# Patient Record
Sex: Female | Born: 1940 | Race: Black or African American | Hispanic: No | Marital: Single | Smoking: Never smoker
Health system: Southern US, Community
[De-identification: ages and names within clinical notes are randomized; demographics above are authoritative.]

## PROBLEM LIST (undated history)

## (undated) DIAGNOSIS — K2211 Ulcer of esophagus with bleeding: Secondary | ICD-10-CM

## (undated) DIAGNOSIS — Z87442 Personal history of urinary calculi: Secondary | ICD-10-CM

## (undated) DIAGNOSIS — Z5189 Encounter for other specified aftercare: Secondary | ICD-10-CM

## (undated) DIAGNOSIS — C801 Malignant (primary) neoplasm, unspecified: Secondary | ICD-10-CM

## (undated) DIAGNOSIS — E785 Hyperlipidemia, unspecified: Secondary | ICD-10-CM

## (undated) DIAGNOSIS — D649 Anemia, unspecified: Secondary | ICD-10-CM

## (undated) DIAGNOSIS — D126 Benign neoplasm of colon, unspecified: Secondary | ICD-10-CM

## (undated) DIAGNOSIS — N281 Cyst of kidney, acquired: Secondary | ICD-10-CM

## (undated) DIAGNOSIS — Z923 Personal history of irradiation: Secondary | ICD-10-CM

## (undated) DIAGNOSIS — N189 Chronic kidney disease, unspecified: Secondary | ICD-10-CM

## (undated) DIAGNOSIS — K219 Gastro-esophageal reflux disease without esophagitis: Secondary | ICD-10-CM

## (undated) DIAGNOSIS — D869 Sarcoidosis, unspecified: Secondary | ICD-10-CM

## (undated) DIAGNOSIS — I10 Essential (primary) hypertension: Secondary | ICD-10-CM

## (undated) DIAGNOSIS — H269 Unspecified cataract: Secondary | ICD-10-CM

## (undated) DIAGNOSIS — T7840XA Allergy, unspecified, initial encounter: Secondary | ICD-10-CM

## (undated) DIAGNOSIS — E119 Type 2 diabetes mellitus without complications: Secondary | ICD-10-CM

## (undated) HISTORY — PX: COLONOSCOPY: SHX174

## (undated) HISTORY — PX: ABDOMINAL HYSTERECTOMY: SHX81

## (undated) HISTORY — DX: Chronic kidney disease, unspecified: N18.9

## (undated) HISTORY — PX: BREAST LUMPECTOMY: SHX2

## (undated) HISTORY — DX: Allergy, unspecified, initial encounter: T78.40XA

## (undated) HISTORY — DX: Benign neoplasm of colon, unspecified: D12.6

## (undated) HISTORY — DX: Hyperlipidemia, unspecified: E78.5

## (undated) HISTORY — PX: LITHOTRIPSY: SUR834

## (undated) HISTORY — PX: CATARACT EXTRACTION W/ INTRAOCULAR LENS IMPLANT: SHX1309

## (undated) HISTORY — DX: Gastro-esophageal reflux disease without esophagitis: K21.9

## (undated) HISTORY — PX: POLYPECTOMY: SHX149

## (undated) HISTORY — DX: Encounter for other specified aftercare: Z51.89

## (undated) HISTORY — DX: Unspecified cataract: H26.9

## (undated) HISTORY — PX: UPPER GASTROINTESTINAL ENDOSCOPY: SHX188

## (undated) HISTORY — DX: Anemia, unspecified: D64.9

---
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
## 1968-03-24 DIAGNOSIS — D869 Sarcoidosis, unspecified: Secondary | ICD-10-CM

## 1968-03-24 HISTORY — DX: Sarcoidosis, unspecified: D86.9

## 1997-06-22 ENCOUNTER — Other Ambulatory Visit: Admission: RE | Admit: 1997-06-22 | Discharge: 1997-06-22 | Payer: Self-pay | Admitting: Internal Medicine

## 1998-04-10 ENCOUNTER — Ambulatory Visit (HOSPITAL_COMMUNITY): Admission: RE | Admit: 1998-04-10 | Discharge: 1998-04-10 | Payer: Self-pay | Admitting: Internal Medicine

## 1998-04-10 ENCOUNTER — Encounter: Payer: Self-pay | Admitting: Internal Medicine

## 1999-01-04 ENCOUNTER — Other Ambulatory Visit: Admission: RE | Admit: 1999-01-04 | Discharge: 1999-01-04 | Payer: Self-pay | Admitting: Obstetrics & Gynecology

## 2000-04-15 ENCOUNTER — Encounter: Payer: Self-pay | Admitting: Internal Medicine

## 2000-04-15 ENCOUNTER — Ambulatory Visit (HOSPITAL_COMMUNITY): Admission: RE | Admit: 2000-04-15 | Discharge: 2000-04-15 | Payer: Self-pay | Admitting: Internal Medicine

## 2001-12-21 ENCOUNTER — Encounter: Payer: Self-pay | Admitting: Internal Medicine

## 2001-12-21 ENCOUNTER — Ambulatory Visit (HOSPITAL_COMMUNITY): Admission: RE | Admit: 2001-12-21 | Discharge: 2001-12-21 | Payer: Self-pay | Admitting: Internal Medicine

## 2003-02-28 ENCOUNTER — Emergency Department (HOSPITAL_COMMUNITY): Admission: EM | Admit: 2003-02-28 | Discharge: 2003-02-28 | Payer: Self-pay | Admitting: Emergency Medicine

## 2003-07-19 ENCOUNTER — Ambulatory Visit (HOSPITAL_COMMUNITY): Admission: RE | Admit: 2003-07-19 | Discharge: 2003-07-19 | Payer: Self-pay | Admitting: Internal Medicine

## 2003-07-26 ENCOUNTER — Ambulatory Visit (HOSPITAL_COMMUNITY): Admission: RE | Admit: 2003-07-26 | Discharge: 2003-07-26 | Payer: Self-pay | Admitting: Internal Medicine

## 2005-02-03 ENCOUNTER — Ambulatory Visit (HOSPITAL_COMMUNITY): Admission: RE | Admit: 2005-02-03 | Discharge: 2005-02-03 | Payer: Self-pay | Admitting: Internal Medicine

## 2005-11-28 ENCOUNTER — Ambulatory Visit: Payer: Self-pay | Admitting: Gastroenterology

## 2005-12-24 ENCOUNTER — Ambulatory Visit: Payer: Self-pay | Admitting: Gastroenterology

## 2005-12-29 ENCOUNTER — Encounter: Payer: Self-pay | Admitting: Internal Medicine

## 2005-12-29 ENCOUNTER — Ambulatory Visit: Payer: Self-pay | Admitting: Cardiovascular Disease

## 2006-01-05 ENCOUNTER — Ambulatory Visit: Payer: Self-pay | Admitting: Gastroenterology

## 2007-09-13 ENCOUNTER — Ambulatory Visit (HOSPITAL_COMMUNITY): Admission: RE | Admit: 2007-09-13 | Discharge: 2007-09-13 | Payer: Self-pay | Admitting: Internal Medicine

## 2008-02-21 DIAGNOSIS — E1165 Type 2 diabetes mellitus with hyperglycemia: Secondary | ICD-10-CM

## 2008-02-21 DIAGNOSIS — E118 Type 2 diabetes mellitus with unspecified complications: Secondary | ICD-10-CM

## 2008-02-21 DIAGNOSIS — Z87442 Personal history of urinary calculi: Secondary | ICD-10-CM | POA: Insufficient documentation

## 2008-02-21 DIAGNOSIS — K589 Irritable bowel syndrome without diarrhea: Secondary | ICD-10-CM

## 2008-02-21 DIAGNOSIS — I10 Essential (primary) hypertension: Secondary | ICD-10-CM | POA: Insufficient documentation

## 2008-04-25 ENCOUNTER — Ambulatory Visit: Payer: Self-pay | Admitting: Internal Medicine

## 2008-04-28 ENCOUNTER — Encounter: Payer: Self-pay | Admitting: Internal Medicine

## 2008-04-28 ENCOUNTER — Ambulatory Visit: Payer: Self-pay | Admitting: Internal Medicine

## 2008-05-03 ENCOUNTER — Encounter: Payer: Self-pay | Admitting: Internal Medicine

## 2009-02-06 ENCOUNTER — Ambulatory Visit (HOSPITAL_COMMUNITY): Admission: RE | Admit: 2009-02-06 | Discharge: 2009-02-06 | Payer: Self-pay | Admitting: Internal Medicine

## 2009-02-08 ENCOUNTER — Ambulatory Visit: Admission: RE | Admit: 2009-02-08 | Discharge: 2009-02-08 | Payer: Self-pay | Admitting: Internal Medicine

## 2009-02-22 ENCOUNTER — Encounter: Admission: RE | Admit: 2009-02-22 | Discharge: 2009-02-22 | Payer: Self-pay | Admitting: Internal Medicine

## 2009-10-15 ENCOUNTER — Ambulatory Visit (HOSPITAL_COMMUNITY)
Admission: RE | Admit: 2009-10-15 | Discharge: 2009-10-15 | Payer: Self-pay | Source: Home / Self Care | Admitting: Internal Medicine

## 2010-02-07 ENCOUNTER — Ambulatory Visit (HOSPITAL_COMMUNITY): Admission: RE | Admit: 2010-02-07 | Discharge: 2010-02-07 | Payer: Self-pay | Admitting: Internal Medicine

## 2010-04-14 ENCOUNTER — Encounter: Payer: Self-pay | Admitting: Internal Medicine

## 2010-04-14 ENCOUNTER — Encounter: Payer: Self-pay | Admitting: *Deleted

## 2010-08-09 NOTE — Assessment & Plan Note (Signed)
[facility]                           GASTROENTEROLOGY OFFICE NOTE   NAME:Lavalley, Daneli                          MRN:          [id number]  DATE:01/05/2006                            DOB:          01-26-1941    Elizabeth Mcfarland comes in at 10:15 and says she is having some pain in her back, CVA  area, some difficulty with urination. She sees Dr. Jeanell Sparrow and I told  her that she needed to follow up with him about this because I did not feel  at all that the cyst measuring only approximately 5-cm in her kidneys are  responsible for her symptoms. Tried to reassure her with the CT scan that  everything seemed to be fine within her abdominal cavity and that a renal  cyst of this magnitude is not really very significant in most cases. She  said she was still having some difficulty. I told her she is to follow up  with Dr. Chestine Spore and maybe needed to see a urologist and I would notify Dr.  Chestine Spore of this.  She described the pain as a 7/10. It is certainly is  somewhat concerning and I think that she would best  follow up sooner than  later with Dr. Chestine Spore. She tried to explain her symptoms. I really do not  think it is gastrointestinal and was trying to indicate this to her. I told  her she ought to continue on her Protonix and her other medications.   Send copies of all of her x-ray studies along with this note as well.            ______________________________  Ulyess Mort, MD      SML/MedQ  DD:  01/05/2006  DT:  01/06/2006  Job #:  [id number]   cc:   Margaretmary Bayley, M.D.

## 2010-08-09 NOTE — Assessment & Plan Note (Signed)
[facility]                           GASTROENTEROLOGY OFFICE NOTE   NAME:Emmer, Felicia                          MRN:          [id number]  DATE:12/24/2005                            DOB:          30-Mar-1940    Denesha says she is doing a little bit better.  She does have some left upper  quadrant soreness.  She says it is a 3 on a scale of 1 to 10.  She says it  is mostly at nighttime intermittently.  She is quite concerned.   PHYSICAL EXAMINATION:  VITAL SIGNS:  Weight 169.  Blood pressure 142/72,  pulse 80 and regular.  NECK:  Unremarkable.  CARDIAC:  Unremarkable.  EXTREMITIES:  Unremarkable.   IMPRESSION:  1. Persistent abdominal pain.  2. History of irritable bowel syndrome.  3. 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   RECOMMENDATIONS:  I recommend that we get a CT of her abdomen and pelvis.  When I indicated this to her, her eyes lit up, as I think she is quite  concerned and she would feel much better if we could reassure her by having  this study.  She does not have any medical insurance, and that is why we  were reluctant to do it previously, but I think that she is so anxious about  it now that this is the only thing that is going to satisfy her.            ______________________________  Ulyess Mort, MD      SML/MedQ  DD:  12/24/2005  DT:  12/26/2005  Job #:  [id number]

## 2010-08-09 NOTE — Assessment & Plan Note (Signed)
[facility]                           GASTROENTEROLOGY OFFICE NOTE   NAME:Elizabeth Mcfarland, Elizabeth Mcfarland                          MRN:          [id number]  DATE:11/28/2005                            DOB:          [birth year]    This very nice lady is referred by Dr. Margaretmary Bayley.  She says she has been  having some discomfort in her stomach over the past 3 months, but when she  describes her stomach it is really her abdomen.  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  But in any case, she says it is worse at night, especially when lying  down.  She gets gas and bloating, especially at night.  She said she was  found to have H. pylori, and did take her Prevpac, and this did help her  symptoms.  She also has taken simethicone from a friend, and this helped her  as well.  She has had some heartburn indigestion, but this has been improved  since her treatment.  She is not taking a PPI.   PAST MEDICAL HISTORY:  Reveals she had a colonoscopic examination 12 years  ago, and it was normal.  Her only family history is that she has had a  sister and brother with polyps of the colon.  She has had hypertension and  some diabetes, for which she takes glimepiride, Avandia, Avandamet.  She  also takes Norvasc, ibuprofen, amlodipine.   She has had surgery for kidney stones.   SOCIAL HISTORY:  Noncontributory except to note she is a Agricultural engineer,  and is continuing to work helping elderly patients.   REVIEW OF SYSTEMS:  Reveals some arthritis only.   PHYSICAL EXAMINATION:  VITAL SIGNS:  She is 5 feet 6 inches, weight was 162,  blood pressure 148/62, pulse 80 and regular.  OROPHARYNX:  Negative.  NECK:  Negative.  CHEST:  Clear.  HEART:  Revealed a regular rhythm.  ABDOMEN:  Soft, no masses or organomegaly.  RECTAL:  Deferred.   IMPRESSION:  1. Lower abdominal pain as described, of questionable  etiology.  2. Diabetes.  3. Hypertension.  4. Status post hysterectomy and renolithiasis.  5. History of mild gastroesophageal reflux disease and positive H. pylori.  6. Normal colonoscopic examination 12 years previous, with family history      of siblings having polyps of the colon.   RECOMMENDATIONS:  The recommendation is that we treat her with Protonix 1  daily.  I gave her a number of samples, some Align take 1 a day, gave her  some instructions on gastroparesis secondary to diabetes, and irritable  bowel syndrome.  She does says that she does get some relief with passage of  gas. With her family history of colon polyps I am a little concerned about  her colon, but told her that I would like to see how she responds to the  above therapeutic regimen, and in 3 to 4 weeks if she is not any better I  would suggest  a CT of her abdomen and pelvis.  With the ibuprofen maybe she  does have a small ulcer, but her symptoms really are not consistent with  that at this point, and the Protonix should help it in any case.  Unfortunately, she does not have medical insurance, and therefore I think  the above approach will be satisfactory, and I promised her that Dr. Chestine Spore  and myself would certainly not do anything to jeopardize her health in any  case.  She certainly is a lovely lady, and needs to be given the best of  care.                                   Ulyess Mort, MD   SML/MedQ  DD:  11/28/2005  DT:  11/29/2005  Job #:  [id number]   cc:   Margaretmary Bayley, M.D.

## 2010-08-09 NOTE — Letter (Signed)
November 28, 2005     Margaretmary Bayley, M.D.  [address]  [place], [place] [address]   RE:  ALLY, KNODEL  MRN:  [id number]  /  DOB:  [birth year]   Dear Fraser Din,   It certainly was nice to be able to work with you concerning your very nice  patient, Emmily Pellegrin.  I hope the above approach is satisfactory with you.  If not, please give me a call.   I certainly miss talking to my good old doctor friends, since I do not have  a chance to get into the hospital setting nearly as much as I used to, but I  hope all is going well with you and hopefully our paths will cross sometime  in the near future.   With best personal regards,    Sincerely,      Ulyess Mort, MD   SML/MedQ  DD:  11/28/2005  DT:  11/29/2005  Job #:  [id number]

## 2010-12-28 ENCOUNTER — Emergency Department (HOSPITAL_COMMUNITY)
Admission: EM | Admit: 2010-12-28 | Discharge: 2010-12-28 | Disposition: A | Discharge status: Home / Self Care | Payer: Medicare Other | Attending: Emergency Medicine | Admitting: Emergency Medicine

## 2010-12-28 DIAGNOSIS — E119 Type 2 diabetes mellitus without complications: Secondary | ICD-10-CM | POA: Insufficient documentation

## 2010-12-28 DIAGNOSIS — W260XXA Contact with knife, initial encounter: Secondary | ICD-10-CM | POA: Insufficient documentation

## 2010-12-28 DIAGNOSIS — S61209A Unspecified open wound of unspecified finger without damage to nail, initial encounter: Secondary | ICD-10-CM | POA: Insufficient documentation

## 2010-12-28 DIAGNOSIS — I10 Essential (primary) hypertension: Secondary | ICD-10-CM | POA: Insufficient documentation

## 2011-02-26 ENCOUNTER — Other Ambulatory Visit: Payer: Self-pay | Admitting: Internal Medicine

## 2011-02-26 DIAGNOSIS — Z1231 Encounter for screening mammogram for malignant neoplasm of breast: Secondary | ICD-10-CM

## 2011-04-03 ENCOUNTER — Ambulatory Visit (HOSPITAL_COMMUNITY)
Admission: RE | Admit: 2011-04-03 | Discharge: 2011-04-03 | Disposition: A | Discharge status: Home / Self Care | Payer: Medicare Other | Source: Ambulatory Visit | Attending: Internal Medicine | Admitting: Internal Medicine

## 2011-04-03 DIAGNOSIS — Z1231 Encounter for screening mammogram for malignant neoplasm of breast: Secondary | ICD-10-CM | POA: Insufficient documentation

## 2011-05-29 DIAGNOSIS — I10 Essential (primary) hypertension: Secondary | ICD-10-CM | POA: Diagnosis not present

## 2011-05-29 DIAGNOSIS — M5412 Radiculopathy, cervical region: Secondary | ICD-10-CM | POA: Diagnosis not present

## 2011-06-10 DIAGNOSIS — E119 Type 2 diabetes mellitus without complications: Secondary | ICD-10-CM | POA: Diagnosis not present

## 2011-08-28 DIAGNOSIS — M25519 Pain in unspecified shoulder: Secondary | ICD-10-CM | POA: Diagnosis not present

## 2011-08-28 DIAGNOSIS — I10 Essential (primary) hypertension: Secondary | ICD-10-CM | POA: Diagnosis not present

## 2011-08-28 DIAGNOSIS — M5412 Radiculopathy, cervical region: Secondary | ICD-10-CM | POA: Diagnosis not present

## 2011-08-28 DIAGNOSIS — E559 Vitamin D deficiency, unspecified: Secondary | ICD-10-CM | POA: Diagnosis not present

## 2011-09-22 DIAGNOSIS — H40019 Open angle with borderline findings, low risk, unspecified eye: Secondary | ICD-10-CM | POA: Diagnosis not present

## 2011-11-27 DIAGNOSIS — I10 Essential (primary) hypertension: Secondary | ICD-10-CM | POA: Diagnosis not present

## 2011-11-27 DIAGNOSIS — E039 Hypothyroidism, unspecified: Secondary | ICD-10-CM | POA: Diagnosis not present

## 2011-11-27 DIAGNOSIS — M5412 Radiculopathy, cervical region: Secondary | ICD-10-CM | POA: Diagnosis not present

## 2012-01-12 DIAGNOSIS — Z23 Encounter for immunization: Secondary | ICD-10-CM | POA: Diagnosis not present

## 2012-02-24 DIAGNOSIS — M5412 Radiculopathy, cervical region: Secondary | ICD-10-CM | POA: Diagnosis not present

## 2012-02-24 DIAGNOSIS — I10 Essential (primary) hypertension: Secondary | ICD-10-CM | POA: Diagnosis not present

## 2012-02-24 DIAGNOSIS — M25519 Pain in unspecified shoulder: Secondary | ICD-10-CM | POA: Diagnosis not present

## 2012-03-09 ENCOUNTER — Other Ambulatory Visit: Payer: Self-pay | Admitting: Internal Medicine

## 2012-03-09 DIAGNOSIS — Z1231 Encounter for screening mammogram for malignant neoplasm of breast: Secondary | ICD-10-CM

## 2012-03-30 DIAGNOSIS — H40019 Open angle with borderline findings, low risk, unspecified eye: Secondary | ICD-10-CM | POA: Diagnosis not present

## 2012-04-06 ENCOUNTER — Ambulatory Visit (HOSPITAL_COMMUNITY)
Admission: RE | Admit: 2012-04-06 | Discharge: 2012-04-06 | Disposition: A | Discharge status: Home / Self Care | Payer: Medicare Other | Source: Ambulatory Visit | Attending: Internal Medicine | Admitting: Internal Medicine

## 2012-04-06 DIAGNOSIS — Z1231 Encounter for screening mammogram for malignant neoplasm of breast: Secondary | ICD-10-CM

## 2012-04-16 ENCOUNTER — Encounter (HOSPITAL_COMMUNITY): Payer: Self-pay

## 2012-04-16 ENCOUNTER — Emergency Department (HOSPITAL_COMMUNITY)
Admission: EM | Admit: 2012-04-16 | Discharge: 2012-04-16 | Disposition: A | Discharge status: Home / Self Care | Payer: Medicare Other | Attending: Emergency Medicine | Admitting: Emergency Medicine

## 2012-04-16 DIAGNOSIS — Z79899 Other long term (current) drug therapy: Secondary | ICD-10-CM | POA: Diagnosis not present

## 2012-04-16 DIAGNOSIS — Z7982 Long term (current) use of aspirin: Secondary | ICD-10-CM | POA: Insufficient documentation

## 2012-04-16 DIAGNOSIS — R1110 Vomiting, unspecified: Secondary | ICD-10-CM | POA: Insufficient documentation

## 2012-04-16 DIAGNOSIS — E1169 Type 2 diabetes mellitus with other specified complication: Secondary | ICD-10-CM | POA: Insufficient documentation

## 2012-04-16 DIAGNOSIS — R5381 Other malaise: Secondary | ICD-10-CM | POA: Diagnosis not present

## 2012-04-16 HISTORY — DX: Type 2 diabetes mellitus without complications: E11.9

## 2012-04-16 LAB — CBC WITH DIFFERENTIAL/PLATELET
Basophils Absolute: 0 10*3/uL (ref 0.0–0.1)
Basophils Relative: 0 % (ref 0–1)
Eosinophils Relative: 0 % (ref 0–5)
Monocytes Absolute: 0.3 10*3/uL (ref 0.1–1.0)
Monocytes Relative: 3 % (ref 3–12)
Neutro Abs: 10.4 10*3/uL — ABNORMAL HIGH (ref 1.7–7.7)
Neutrophils Relative %: 95 % — ABNORMAL HIGH (ref 43–77)
RDW: 12.4 % (ref 11.5–15.5)
WBC: 10.9 10*3/uL — ABNORMAL HIGH (ref 4.0–10.5)

## 2012-04-16 LAB — URINALYSIS, ROUTINE W REFLEX MICROSCOPIC
Ketones, ur: NEGATIVE mg/dL
Leukocytes, UA: NEGATIVE
Nitrite: NEGATIVE
Protein, ur: 30 mg/dL — AB
pH: 6.5 (ref 5.0–8.0)

## 2012-04-16 LAB — COMPREHENSIVE METABOLIC PANEL
AST: 24 U/L (ref 0–37)
Albumin: 4 g/dL (ref 3.5–5.2)
BUN: 14 mg/dL (ref 6–23)
Calcium: 9.1 mg/dL (ref 8.4–10.5)
Chloride: 98 mEq/L (ref 96–112)
Creatinine, Ser: 0.68 mg/dL (ref 0.50–1.10)
GFR calc non Af Amer: 87 mL/min — ABNORMAL LOW (ref >90)
Total Bilirubin: 0.6 mg/dL (ref 0.3–1.2)

## 2012-04-16 LAB — LIPASE, BLOOD: Lipase: 40 U/L (ref 11–59)

## 2012-04-16 LAB — POCT I-STAT TROPONIN I: Troponin i, poc: 0 ng/mL (ref 0.00–0.08)

## 2012-04-16 MED ORDER — SODIUM CHLORIDE 0.9 % IV BOLUS (SEPSIS)
2000.0000 mL | Freq: Once | INTRAVENOUS | Status: AC
  Administered 2012-04-16: 2000 mL via INTRAVENOUS

## 2012-04-16 MED ORDER — ONDANSETRON 8 MG PO TBDP: ORAL_TABLET | ORAL | Status: DC

## 2012-04-16 MED ORDER — ONDANSETRON HCL 4 MG/2ML IJ SOLN
4.0000 mg | Freq: Once | INTRAMUSCULAR | Status: AC
  Administered 2012-04-16: 4 mg via INTRAVENOUS
  Filled 2012-04-16: qty 2

## 2012-04-16 MED ORDER — SODIUM CHLORIDE 0.9 % IV SOLN: INTRAVENOUS | Status: DC

## 2012-04-16 MED ORDER — METOCLOPRAMIDE HCL 10 MG PO TABS: 10.0000 mg | ORAL_TABLET | Freq: Four times a day (QID) | ORAL | Status: DC | PRN

## 2012-04-16 NOTE — ED Provider Notes (Signed)
History     CSN: [id number]  Arrival date & time 04/16/12  1524   First MD Initiated Contact with Patient 04/16/12 1536      Chief Complaint  Patient presents with  . Emesis  . Hyperglycemia    (Consider location/radiation/quality/duration/timing/severity/associated sxs/prior treatment) HPI This 72 year old female has history of diabetes and today presents with 12 hours of nonbloody vomiting a couple times per hour with no abdominal pain or diarrhea. She did have 3 normal bowel movements today. She has had no bloody vomiting no bloody stools and no dysuria. She is no urinary frequency. She is no chest pain cough or shortness of breath. She feels generally weak with a dry mouth. There is no treatment prior to arrival. 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 Past Medical History  Diagnosis Date  . Diabetes mellitus without complication     Past Surgical History  Procedure Date  . Abdominal hysterectomy   . Lithotripsy     No family history on file.  History  Substance Use Topics  . Smoking status: Never Smoker   . Smokeless tobacco: Not on file  . Alcohol Use: No    OB History    Grav Para Term Preterm Abortions TAB SAB Ect Mult Living                  Review of Systems 10 Systems reviewed and are negative for acute change except as noted in the HPI. Allergies  Codeine and Percocet  Home Medications   Current Outpatient Rx  Name  Route  Sig  Dispense  Refill  . AMLODIPINE BESYLATE 10 MG PO TABS   Oral   Take 10 mg by mouth every morning.          . ASPIRIN EC 81 MG PO TBEC   Oral   Take 81 mg by mouth every morning.          Marland Kitchen VITAMIN D 1000 UNITS PO TABS   Oral   Take 1,000 Units by mouth every morning.          Marland Kitchen GLIMEPIRIDE 4 MG PO TABS   Oral   Take 4 mg by mouth 2 (two) times daily.         Marland Kitchen SAXAGLIPTIN HCL 5 MG PO TABS   Oral   Take 5 mg by mouth every  morning.          Marland Kitchen VITAMIN C 500 MG PO TABS   Oral   Take 500 mg by mouth every morning.          Marland Kitchen METOCLOPRAMIDE HCL 10 MG PO TABS   Oral   Take 1 tablet (10 mg total) by mouth every 6 (six) hours as needed (nausea/headache).   6 tablet   0   . ONDANSETRON 8 MG PO TBDP      8mg  ODT q4 hours prn nausea   4 tablet   0     BP 139/77  Pulse 85  Temp 99 F (37.2 C) (Oral)  Resp 20  Ht 5\' 6"  (1.676 m)  Wt 155 lb (70.308 kg)  BMI 25.02 kg/m2  SpO2 98%  Physical Exam  Nursing note and vitals reviewed. Constitutional:       Awake, alert, nontoxic appearance.  HENT:  Head: Atraumatic.       Slightly dry mucosa  Eyes: Right eye exhibits no discharge. Left eye  exhibits no discharge.  Neck: Neck supple.  Cardiovascular: Normal rate and regular rhythm.   No murmur heard. Pulmonary/Chest: Effort normal and breath sounds normal. No respiratory distress. She has no wheezes. She has no rales. She exhibits no tenderness.  Abdominal: Soft. Bowel sounds are normal. She exhibits no distension and no mass. There is no tenderness. There is no rebound and no guarding.  Musculoskeletal: She exhibits no tenderness.       Baseline ROM, no obvious new focal weakness.  Neurological: She is alert.       Mental status and motor strength appears baseline for patient and situation.  Skin: No rash noted.  Psychiatric: She has a normal mood and affect.    ED Course  Procedures (including critical care time) Pt feels improved after observation and/or treatment in ED. Labs Reviewed  CBC WITH DIFFERENTIAL - Abnormal; Notable for the following:    WBC 10.9 (*)     Neutrophils Relative 95 (*)     Neutro Abs 10.4 (*)     Lymphocytes Relative 2 (*)     Lymphs Abs 0.2 (*)     All other components within normal limits  COMPREHENSIVE METABOLIC PANEL - Abnormal; Notable for the following:    Sodium 133 (*)     Glucose, Bld 309 (*)     GFR calc non Af Amer 87 (*)     All other components  within normal limits  URINALYSIS, ROUTINE W REFLEX MICROSCOPIC - Abnormal; Notable for the following:    Glucose, UA >1000 (*)     Protein, ur 30 (*)     All other components within normal limits  GLUCOSE, CAPILLARY - Abnormal; Notable for the following:    Glucose-Capillary 203 (*)     All other components within normal limits  LIPASE, BLOOD  POCT I-STAT TROPONIN I  URINE MICROSCOPIC-ADD ON  LAB REPORT - SCANNED   No results found.   1. Vomiting       MDM  Pt stable in ED with no significant deterioration in condition. Patient / Family / Caregiver informed of clinical course, understand medical decision-making process, and agree with plan.  I doubt any other EMC precluding discharge at this time including, but not necessarily limited to the following:sepsis, DKA.        Hurman Horn, MD 04/19/12 [phone]

## 2012-04-16 NOTE — ED Notes (Signed)
Pt states she's been vomiting x 12 hrs and on last check of CBG at 1pm today it was 258.  Denies any other symptoms.

## 2012-04-16 NOTE — ED Notes (Signed)
EDMD at bedside

## 2012-06-17 DIAGNOSIS — M5412 Radiculopathy, cervical region: Secondary | ICD-10-CM | POA: Diagnosis not present

## 2012-06-17 DIAGNOSIS — I10 Essential (primary) hypertension: Secondary | ICD-10-CM | POA: Diagnosis not present

## 2012-08-26 DIAGNOSIS — I10 Essential (primary) hypertension: Secondary | ICD-10-CM | POA: Diagnosis not present

## 2012-08-26 DIAGNOSIS — M5412 Radiculopathy, cervical region: Secondary | ICD-10-CM | POA: Diagnosis not present

## 2012-08-26 DIAGNOSIS — R634 Abnormal weight loss: Secondary | ICD-10-CM | POA: Diagnosis not present

## 2012-08-26 DIAGNOSIS — M25569 Pain in unspecified knee: Secondary | ICD-10-CM | POA: Diagnosis not present

## 2012-09-28 DIAGNOSIS — H3589 Other specified retinal disorders: Secondary | ICD-10-CM | POA: Diagnosis not present

## 2012-09-28 DIAGNOSIS — H40019 Open angle with borderline findings, low risk, unspecified eye: Secondary | ICD-10-CM | POA: Diagnosis not present

## 2012-09-28 DIAGNOSIS — Z961 Presence of intraocular lens: Secondary | ICD-10-CM | POA: Diagnosis not present

## 2012-09-28 DIAGNOSIS — E119 Type 2 diabetes mellitus without complications: Secondary | ICD-10-CM | POA: Diagnosis not present

## 2012-12-02 DIAGNOSIS — M5412 Radiculopathy, cervical region: Secondary | ICD-10-CM | POA: Diagnosis not present

## 2012-12-02 DIAGNOSIS — I10 Essential (primary) hypertension: Secondary | ICD-10-CM | POA: Diagnosis not present

## 2013-01-13 DIAGNOSIS — M5412 Radiculopathy, cervical region: Secondary | ICD-10-CM | POA: Diagnosis not present

## 2013-01-13 DIAGNOSIS — I10 Essential (primary) hypertension: Secondary | ICD-10-CM | POA: Diagnosis not present

## 2013-01-13 DIAGNOSIS — Z23 Encounter for immunization: Secondary | ICD-10-CM | POA: Diagnosis not present

## 2013-04-20 ENCOUNTER — Other Ambulatory Visit: Payer: Self-pay | Admitting: Internal Medicine

## 2013-04-20 DIAGNOSIS — Z1231 Encounter for screening mammogram for malignant neoplasm of breast: Secondary | ICD-10-CM

## 2013-04-26 ENCOUNTER — Ambulatory Visit (HOSPITAL_COMMUNITY)
Admission: RE | Admit: 2013-04-26 | Discharge: 2013-04-26 | Disposition: A | Discharge status: Home / Self Care | Payer: Medicare Other | Source: Ambulatory Visit | Attending: Internal Medicine | Admitting: Internal Medicine

## 2013-04-26 ENCOUNTER — Other Ambulatory Visit: Payer: Self-pay | Admitting: Internal Medicine

## 2013-04-26 DIAGNOSIS — Z1231 Encounter for screening mammogram for malignant neoplasm of breast: Secondary | ICD-10-CM

## 2013-05-02 ENCOUNTER — Encounter (HOSPITAL_COMMUNITY): Payer: Self-pay | Admitting: Emergency Medicine

## 2013-05-02 ENCOUNTER — Emergency Department (HOSPITAL_COMMUNITY)
Admission: EM | Admit: 2013-05-02 | Discharge: 2013-05-03 | Disposition: A | Discharge status: Home / Self Care | Payer: Medicare Other | Attending: Emergency Medicine | Admitting: Emergency Medicine

## 2013-05-02 DIAGNOSIS — I10 Essential (primary) hypertension: Secondary | ICD-10-CM | POA: Diagnosis not present

## 2013-05-02 DIAGNOSIS — Z79899 Other long term (current) drug therapy: Secondary | ICD-10-CM | POA: Diagnosis not present

## 2013-05-02 DIAGNOSIS — D649 Anemia, unspecified: Secondary | ICD-10-CM | POA: Diagnosis not present

## 2013-05-02 DIAGNOSIS — E119 Type 2 diabetes mellitus without complications: Secondary | ICD-10-CM | POA: Insufficient documentation

## 2013-05-02 DIAGNOSIS — R61 Generalized hyperhidrosis: Secondary | ICD-10-CM | POA: Diagnosis not present

## 2013-05-02 DIAGNOSIS — K259 Gastric ulcer, unspecified as acute or chronic, without hemorrhage or perforation: Secondary | ICD-10-CM | POA: Insufficient documentation

## 2013-05-02 HISTORY — DX: Essential (primary) hypertension: I10

## 2013-05-02 LAB — URINE MICROSCOPIC-ADD ON

## 2013-05-02 LAB — CBC WITH DIFFERENTIAL/PLATELET
Basophils Absolute: 0 10*3/uL (ref 0.0–0.1)
Basophils Relative: 0 % (ref 0–1)
Eosinophils Absolute: 0 10*3/uL (ref 0.0–0.7)
Eosinophils Relative: 0 % (ref 0–5)
HCT: 30.9 % — ABNORMAL LOW (ref 36.0–46.0)
Hemoglobin: 10.3 g/dL — ABNORMAL LOW (ref 12.0–15.0)
LYMPHS ABS: 0.6 10*3/uL — AB (ref 0.7–4.0)
LYMPHS PCT: 5 % — AB (ref 12–46)
MCH: 29.3 pg (ref 26.0–34.0)
MCHC: 33.3 g/dL (ref 30.0–36.0)
MCV: 87.8 fL (ref 78.0–100.0)
Monocytes Absolute: 0.2 10*3/uL (ref 0.1–1.0)
Monocytes Relative: 2 % — ABNORMAL LOW (ref 3–12)
NEUTROS ABS: 10 10*3/uL — AB (ref 1.7–7.7)
NEUTROS PCT: 93 % — AB (ref 43–77)
PLATELETS: 219 10*3/uL (ref 150–400)
RBC: 3.52 MIL/uL — AB (ref 3.87–5.11)
RDW: 12.9 % (ref 11.5–15.5)
WBC: 10.8 10*3/uL — AB (ref 4.0–10.5)

## 2013-05-02 LAB — URINALYSIS, ROUTINE W REFLEX MICROSCOPIC
BILIRUBIN URINE: NEGATIVE
HGB URINE DIPSTICK: NEGATIVE
Ketones, ur: NEGATIVE mg/dL
Leukocytes, UA: NEGATIVE
Nitrite: NEGATIVE
PROTEIN: NEGATIVE mg/dL
SPECIFIC GRAVITY, URINE: 1.022 (ref 1.005–1.030)
UROBILINOGEN UA: 0.2 mg/dL (ref 0.0–1.0)
pH: 6 (ref 5.0–8.0)

## 2013-05-02 LAB — LIPASE, BLOOD: Lipase: 38 U/L (ref 11–59)

## 2013-05-02 LAB — COMPREHENSIVE METABOLIC PANEL
ALK PHOS: 59 U/L (ref 39–117)
ALT: 21 U/L (ref 0–35)
AST: 21 U/L (ref 0–37)
Albumin: 3.6 g/dL (ref 3.5–5.2)
BUN: 34 mg/dL — ABNORMAL HIGH (ref 6–23)
CO2: 25 meq/L (ref 19–32)
Calcium: 8.6 mg/dL (ref 8.4–10.5)
Chloride: 97 mEq/L (ref 96–112)
Creatinine, Ser: 0.77 mg/dL (ref 0.50–1.10)
GFR, EST NON AFRICAN AMERICAN: 83 mL/min — AB (ref >90)
GLUCOSE: 374 mg/dL — AB (ref 70–99)
POTASSIUM: 4.5 meq/L (ref 3.7–5.3)
SODIUM: 134 meq/L — AB (ref 137–147)
Total Bilirubin: 0.4 mg/dL (ref 0.3–1.2)
Total Protein: 6.3 g/dL (ref 6.0–8.3)

## 2013-05-02 MED ORDER — ONDANSETRON 8 MG PO TBDP
8.0000 mg | ORAL_TABLET | Freq: Once | ORAL | Status: AC
  Administered 2013-05-02: 8 mg via ORAL
  Filled 2013-05-02: qty 1

## 2013-05-02 NOTE — ED Notes (Signed)
Pt states that she began vomiting around 5:30pm; pt states that she has vomited x 2; denies diarrhea or abd pain; pt states that the emesis is "foul smelling" and she is concerned

## 2013-05-03 DIAGNOSIS — M5412 Radiculopathy, cervical region: Secondary | ICD-10-CM | POA: Diagnosis not present

## 2013-05-03 DIAGNOSIS — K922 Gastrointestinal hemorrhage, unspecified: Secondary | ICD-10-CM | POA: Diagnosis not present

## 2013-05-03 DIAGNOSIS — I10 Essential (primary) hypertension: Secondary | ICD-10-CM | POA: Diagnosis not present

## 2013-05-03 DIAGNOSIS — IMO0001 Reserved for inherently not codable concepts without codable children: Secondary | ICD-10-CM | POA: Diagnosis not present

## 2013-05-03 LAB — OCCULT BLOOD GASTRIC / DUODENUM (SPECIMEN CUP)
OCCULT BLOOD, GASTRIC: POSITIVE — AB
pH, Gastric: 4

## 2013-05-03 LAB — OCCULT BLOOD, POC DEVICE: FECAL OCCULT BLD: NEGATIVE

## 2013-05-03 MED ORDER — FAMOTIDINE 20 MG PO TABS: 20.0000 mg | ORAL_TABLET | Freq: Two times a day (BID) | ORAL | Status: DC

## 2013-05-03 MED ORDER — LANSOPRAZOLE 15 MG PO CPDR: 15.0000 mg | DELAYED_RELEASE_CAPSULE | Freq: Every day | ORAL | Status: DC

## 2013-05-03 NOTE — ED Provider Notes (Signed)
CSN: [id number]     Arrival date & time 05/02/13  2148 History   First MD Initiated Contact with Patient 05/03/13 0131     Chief Complaint  Patient presents with  . Emesis     (Consider location/radiation/quality/duration/timing/severity/associated sxs/prior Treatment) HPI Comments: 2 episodes of vomiting today - looked like coffee grounds, no associated abdominal pain.  She had heartburn 2 weeks ago.  No diarrhea.  She does have chronic constipation.  She is on ASA 81, recently added Januvia to meds.  She reports daily use of Naprosyn for chronic arthritis, occasional TUMS, no history of peptic ulcer disease. She has had a colonoscopy in the past which showed a couple of polyps but no other significant findings. She denies any history of GI bleeding, cirrhosis, alcohol use or hepatitis.  Very discretely states that she became ill this evening with diaphoresis, nausea and had 2 episodes of vomiting and since that time has felt very well. At this time she has no symptoms including no abdominal discomfort and no nausea.  Patient is a 73 y.o. female presenting with vomiting. The history is provided by the patient.  Emesis   Past Medical History  Diagnosis Date  . Diabetes mellitus without complication   . Hypertension    Past Surgical History  Procedure Laterality Date  . Abdominal hysterectomy    . Lithotripsy     No family history on file. History  Substance Use Topics  . Smoking status: Never Smoker   . Smokeless tobacco: Not on file  . Alcohol Use: No   OB History   Grav Para Term Preterm Abortions TAB SAB Ect Mult Living                 Review of Systems  Gastrointestinal: Positive for vomiting.  All other systems reviewed and are negative.      Allergies  Codeine and Percocet  Home Medications   Current Outpatient Rx  Name  Route  Sig  Dispense  Refill  . amLODipine (NORVASC) 10 MG tablet   Oral   Take 10 mg by mouth every morning.          .  cholecalciferol (VITAMIN D) 1000 UNITS tablet   Oral   Take 1,000 Units by mouth every morning.          Marland Kitchen glimepiride (AMARYL) 4 MG tablet   Oral   Take 4 mg by mouth 2 (two) times daily.         . sitaGLIPtin (JANUVIA) 100 MG tablet   Oral   Take 100 mg by mouth daily.         . vitamin C (ASCORBIC ACID) 500 MG tablet   Oral   Take 500 mg by mouth every morning.          . famotidine (PEPCID) 20 MG tablet   Oral   Take 1 tablet (20 mg total) by mouth 2 (two) times daily.   30 tablet   0   . lansoprazole (PREVACID) 15 MG capsule   Oral   Take 1 capsule (15 mg total) by mouth daily at 12 noon.   30 capsule   1   . metoCLOPramide (REGLAN) 10 MG tablet   Oral   Take 1 tablet (10 mg total) by mouth every 6 (six) hours as needed (nausea/headache).   6 tablet   0    BP 144/82  Pulse 81  Temp(Src) 99.6 F (37.6 C) (Oral)  Resp 20  SpO2 100%  Physical Exam  Nursing note and vitals reviewed. Constitutional: She appears well-developed and well-nourished. No distress.  HENT:  Head: Normocephalic and atraumatic.  Mouth/Throat: Oropharynx is clear and moist. No oropharyngeal exudate.  Eyes: Conjunctivae and EOM are normal. Pupils are equal, round, and reactive to light. Right eye exhibits no discharge. Left eye exhibits no discharge. No scleral icterus.  Neck: Normal range of motion. Neck supple. No JVD present. No thyromegaly present.  Cardiovascular: Normal rate, regular rhythm, normal heart sounds and intact distal pulses.  Exam reveals no gallop and no friction rub.   No murmur heard. Pulmonary/Chest: Effort normal and breath sounds normal. No respiratory distress. She has no wheezes. She has no rales.  Abdominal: Soft. Bowel sounds are normal. She exhibits no distension and no mass. There is no tenderness.  Musculoskeletal: Normal range of motion. She exhibits no edema and no tenderness.  Lymphadenopathy:    She has no cervical adenopathy.  Neurological: She is  alert. Coordination normal.  Skin: Skin is warm and dry. No rash noted. No erythema.  Psychiatric: She has a normal mood and affect. Her behavior is normal.    ED Course  Procedures (including critical care time) Labs Review Labs Reviewed  CBC WITH DIFFERENTIAL - Abnormal; Notable for the following:    WBC 10.8 (*)    RBC 3.52 (*)    Hemoglobin 10.3 (*)    HCT 30.9 (*)    Neutrophils Relative % 93 (*)    Neutro Abs 10.0 (*)    Lymphocytes Relative 5 (*)    Lymphs Abs 0.6 (*)    Monocytes Relative 2 (*)    All other components within normal limits  COMPREHENSIVE METABOLIC PANEL - Abnormal; Notable for the following:    Sodium 134 (*)    Glucose, Bld 374 (*)    BUN 34 (*)    GFR calc non Af Amer 83 (*)    All other components within normal limits  URINALYSIS, ROUTINE W REFLEX MICROSCOPIC - Abnormal; Notable for the following:    Glucose, UA >1000 (*)    All other components within normal limits  URINE MICROSCOPIC-ADD ON - Abnormal; Notable for the following:    Crystals CA OXALATE CRYSTALS (*)    All other components within normal limits  OCCULT BLOOD GASTRIC / DUODENUM (SPECIMEN CUP) - Abnormal; Notable for the following:    Occult Blood, Gastric POSITIVE (*)    All other components within normal limits  LIPASE, BLOOD   Imaging Review No results found.  EKG Interpretation   None       MDM   Final diagnoses:  Stomach ulcer  Anemia    Patient is no tenderness, normal vital signs and is not tachycardic on my exam. She brought a specimen of her coffee-ground emesis, this is what it appears to be, will test Gastroccult, Hemoccult as well as her hemoglobin has dropped approximately 3 g over the last 12 months. She is hyperglycemic, she is a diabetic, she does not appear to be in distress.  Gastroccult positive, Hemoccult negative, anemia present, these findings were discussed with the patient and will followup for endoscopy with her gastroenterologist.  Start on GI  meds, stop anti-inflammatories, patient expresses her understanding.   Meds given in ED:  Medications  ondansetron (ZOFRAN-ODT) disintegrating tablet 8 mg (8 mg Oral Given 05/02/13 2246)    New Prescriptions   FAMOTIDINE (PEPCID) 20 MG TABLET    Take 1 tablet (20 mg total) by mouth 2 (two)  times daily.   LANSOPRAZOLE (PREVACID) 15 MG CAPSULE    Take 1 capsule (15 mg total) by mouth daily at 12 noon.      Vida Roller, MD 05/03/13 [phone]

## 2013-05-03 NOTE — Discharge Instructions (Signed)
Please call your doctor for a followup appointment within 24-48 hours. When you talk to your doctor please let them know that you were seen in the emergency department and have them acquire all of your records so that they can discuss the findings with you and formulate a treatment plan to fully care for your new and ongoing problems. ° °

## 2013-05-04 ENCOUNTER — Ambulatory Visit (INDEPENDENT_AMBULATORY_CARE_PROVIDER_SITE_OTHER): Payer: Medicare Other | Admitting: Physician Assistant

## 2013-05-04 ENCOUNTER — Encounter: Payer: Self-pay | Admitting: Physician Assistant

## 2013-05-04 ENCOUNTER — Other Ambulatory Visit (INDEPENDENT_AMBULATORY_CARE_PROVIDER_SITE_OTHER): Payer: Medicare Other

## 2013-05-04 VITALS — BP 144/60 | HR 88 | Ht 66.0 in | Wt 149.4 lb

## 2013-05-04 DIAGNOSIS — K920 Hematemesis: Secondary | ICD-10-CM | POA: Diagnosis not present

## 2013-05-04 DIAGNOSIS — D649 Anemia, unspecified: Secondary | ICD-10-CM | POA: Diagnosis not present

## 2013-05-04 DIAGNOSIS — Z860100 Personal history of colon polyps, unspecified: Secondary | ICD-10-CM

## 2013-05-04 LAB — CBC WITH DIFFERENTIAL/PLATELET
Basophils Absolute: 0 10*3/uL (ref 0.0–0.1)
Basophils Relative: 0.5 % (ref 0.0–3.0)
EOS PCT: 0.1 % (ref 0.0–5.0)
Eosinophils Absolute: 0 10*3/uL (ref 0.0–0.7)
HCT: 28.4 % — ABNORMAL LOW (ref 36.0–46.0)
Hemoglobin: 9.3 g/dL — ABNORMAL LOW (ref 12.0–15.0)
LYMPHS PCT: 17.4 % (ref 12.0–46.0)
Lymphs Abs: 1.2 10*3/uL (ref 0.7–4.0)
MCHC: 32.6 g/dL (ref 30.0–36.0)
MCV: 90.2 fl (ref 78.0–100.0)
MONOS PCT: 6.7 % (ref 3.0–12.0)
Monocytes Absolute: 0.5 10*3/uL (ref 0.1–1.0)
NEUTROS ABS: 5.4 10*3/uL (ref 1.4–7.7)
Neutrophils Relative %: 75.3 % (ref 43.0–77.0)
Platelets: 191 10*3/uL (ref 150.0–400.0)
RBC: 3.15 Mil/uL — ABNORMAL LOW (ref 3.87–5.11)
RDW: 13.6 % (ref 11.5–14.6)
WBC: 7.2 10*3/uL (ref 4.5–10.5)

## 2013-05-04 MED ORDER — LANSOPRAZOLE 30 MG PO CPDR: DELAYED_RELEASE_CAPSULE | ORAL | Status: DC

## 2013-05-04 MED ORDER — MOVIPREP 100 G PO SOLR: 1.0000 | ORAL | Status: DC

## 2013-05-04 NOTE — Progress Notes (Signed)
Subjective:    Patient ID: Elizabeth Mcfarland, female    DOB: [birth year], 73 y.o.   MRN: [id number]  HPI  Elizabeth Mcfarland is a pleasant 73 year old African American female known to Dr. Carlean Mcfarland. She had undergone colonoscopy in February 2010 was found of a 5 mm polyp in the cecum which was a tubular adenoma she is due for 5 year followup. She comes in today because of an episode of coffee-ground emesis on 05/02/2013. She says she had been nauseated that day and vomited up black clumpy coffee-ground looking material on 2 occasions. She went to the emergency room and was evaluated , and found to have gastro-cult positive material  She was Hemoccult-negative area WBC was 10.8 hemoglobin 10.3 hematocrit of 30.8 MCV of 87. Her hemoglobin one year ago was documented at 13.5. BUN was also slightly elevated at 34 creatinine 0.77 She has been taking a baby aspirin daily for many years and says she takes an occasional Bayer aspirin for arthritis symptoms and has also been taking at least one Aleve every day over the past year for arthritis. She has not been having any abdominal pain recently ,has not noted any melena or hematochezia. She says she did notice some soreness in her epigastrium yesterday. Her appetite has been fine, no changes in symptoms with by mouth intake. No dysphagia or odynophagia. She was given a prescription for 15 mg Prevacid in the emergency room as well as Pepcid 20 mg twice daily. No further vomiting or hematemesis since that episode.    Review of Systems  HENT: Negative.   Eyes: Negative.   Respiratory: Negative.   Cardiovascular: Negative.   Gastrointestinal: Positive for vomiting.  Endocrine: Negative.   Genitourinary: Negative.   Musculoskeletal: Positive for arthralgias.  Skin: Negative.   Allergic/Immunologic: Negative.   Neurological: Positive for weakness.  Hematological: Negative.   Psychiatric/Behavioral: Negative.    Outpatient Prescriptions Prior to Visit  Medication Sig  Dispense Refill  . amLODipine (NORVASC) 10 MG tablet Take 10 mg by mouth every morning.       . cholecalciferol (VITAMIN D) 1000 UNITS tablet Take 1,000 Units by mouth every morning.       . famotidine (PEPCID) 20 MG tablet Take 1 tablet (20 mg total) by mouth 2 (two) times daily.  30 tablet  0  . glimepiride (AMARYL) 4 MG tablet Take 4 mg by mouth 2 (two) times daily.      . lansoprazole (PREVACID) 15 MG capsule Take 1 capsule (15 mg total) by mouth daily at 12 noon.  30 capsule  1  . metoCLOPramide (REGLAN) 10 MG tablet Take 1 tablet (10 mg total) by mouth every 6 (six) hours as needed (nausea/headache).  6 tablet  0  . sitaGLIPtin (JANUVIA) 100 MG tablet Take 100 mg by mouth daily.      . vitamin C (ASCORBIC ACID) 500 MG tablet Take 500 mg by mouth every morning.        No facility-administered medications prior to visit.   Allergies  Allergen Reactions  . Codeine     REACTION: nausea  . Percocet [Oxycodone-Acetaminophen] Nausea And Vomiting   Patient Active Problem List   Diagnosis Date Noted  . Personal history of colonic polyps 05/04/2013  . DIABETES MELLITUS, TYPE II 02/21/2008  . HYPERTENSION 02/21/2008  . IBS 02/21/2008  . NEPHROLITHIASIS, HX OF 02/21/2008   History  Substance Use Topics  . Smoking status: Never Smoker   . Smokeless tobacco: Never Used  . Alcohol Use:  No   family history includes Diabetes in her sister.     Objective:   Physical Exam   well-developed older after female in no acute distress, pleasant blood pressure 144/60 pulse 88 height 5 foot 6 weight 149. HEENT; nontraumatic normocephalic EOMI PERRLA sclera anicteric, Supple; no JVD, Cardiovascular; regular rate and rhythm with S1-S2 no murmur or gallop, Ulnar clear bilaterally, Abdomen soft nontender nondistended bowel sounds are active no palpable mass or hepatosplenomegaly, Rectal exams not done she was documented Hemoccult negative in the ER on 05/02/2013, Extremities; no clubbing cyanosis or edema  skin warm and dry, Psych; mood and affect normal and appropriate        Assessment & Plan:  #1  73-year-old African American female with 2 episodes of coffee-ground emesis 48 hours ago on 05/02/2013. Patient currently asymptomatic no melena or hematochezia and no further emesis. Suspect aspirin/NSAID-induced gastropathy or peptic ulcer disease #2 normocytic anemia-possibly secondary to above #3 history of adenomatous colon polyps due for 5 year followup   #4 osteoarthritis #5 hypertension #6 adult onset diabetes mellitus  Plan; repeat CBC today Increase Prevacid to 30 mg daily Stop Pepcid for now Patient is asked to hold her aspirin and NSAIDs Have scheduled for upper endoscopy (as well as colonoscopy at patient's request )for tomorrow with Dr. Gessner at [facility]. Procedures discussed in detail with patient and she is agreeable to proceed  

## 2013-05-04 NOTE — Patient Instructions (Signed)
Please go to the basement level to have your labs drawn.  We sent prescriptions to [facility] [address] 1. Moviprep colonoscopy prep 2. Prevacid 30 mg capsules  Stop the Aleve and extra aspirin. Take the 15 mg prevacid- 2 capsules daily. We did send a new prescription for Prevacid 30 mg capsules you will take 1 cap daily.   You have been scheduled for an endoscopy and colonoscopy with propofol. Please follow the written instructions given to you at your visit today. Please pick up your prep at the pharmacy within the next 1-3 days.  Location [facility], 1st floor. Go to patient registration.

## 2013-05-05 ENCOUNTER — Ambulatory Visit (HOSPITAL_COMMUNITY)
Admission: RE | Admit: 2013-05-05 | Discharge: 2013-05-05 | Disposition: A | Discharge status: Home / Self Care | Payer: Medicare Other | Source: Ambulatory Visit | Attending: Internal Medicine | Admitting: Internal Medicine

## 2013-05-05 ENCOUNTER — Encounter (HOSPITAL_COMMUNITY): Payer: Medicare Other | Admitting: Anesthesiology

## 2013-05-05 ENCOUNTER — Ambulatory Visit (HOSPITAL_COMMUNITY): Payer: Medicare Other | Admitting: Anesthesiology

## 2013-05-05 ENCOUNTER — Encounter (HOSPITAL_COMMUNITY): Payer: Self-pay

## 2013-05-05 ENCOUNTER — Encounter (HOSPITAL_COMMUNITY)
Admission: RE | Disposition: A | Discharge status: Home / Self Care | Payer: Medicare Other | Source: Ambulatory Visit | Attending: Internal Medicine

## 2013-05-05 DIAGNOSIS — M1990 Unspecified osteoarthritis, unspecified site: Secondary | ICD-10-CM | POA: Insufficient documentation

## 2013-05-05 DIAGNOSIS — E119 Type 2 diabetes mellitus without complications: Secondary | ICD-10-CM | POA: Diagnosis not present

## 2013-05-05 DIAGNOSIS — K2210 Ulcer of esophagus without bleeding: Secondary | ICD-10-CM | POA: Diagnosis not present

## 2013-05-05 DIAGNOSIS — K449 Diaphragmatic hernia without obstruction or gangrene: Secondary | ICD-10-CM | POA: Insufficient documentation

## 2013-05-05 DIAGNOSIS — D126 Benign neoplasm of colon, unspecified: Secondary | ICD-10-CM | POA: Diagnosis present

## 2013-05-05 DIAGNOSIS — K920 Hematemesis: Secondary | ICD-10-CM | POA: Diagnosis present

## 2013-05-05 DIAGNOSIS — Z86010 Personal history of colonic polyps: Secondary | ICD-10-CM | POA: Diagnosis not present

## 2013-05-05 DIAGNOSIS — I10 Essential (primary) hypertension: Secondary | ICD-10-CM | POA: Diagnosis not present

## 2013-05-05 DIAGNOSIS — D649 Anemia, unspecified: Secondary | ICD-10-CM | POA: Diagnosis not present

## 2013-05-05 DIAGNOSIS — Z79899 Other long term (current) drug therapy: Secondary | ICD-10-CM | POA: Insufficient documentation

## 2013-05-05 DIAGNOSIS — K2211 Ulcer of esophagus with bleeding: Secondary | ICD-10-CM | POA: Diagnosis not present

## 2013-05-05 HISTORY — DX: Ulcer of esophagus with bleeding: K22.11

## 2013-05-05 HISTORY — PX: COLONOSCOPY, ESOPHAGOGASTRODUODENOSCOPY (EGD) AND ESOPHAGEAL DILATION: SHX5781

## 2013-05-05 LAB — GLUCOSE, CAPILLARY
GLUCOSE-CAPILLARY: 254 mg/dL — AB (ref 70–99)
Glucose-Capillary: 194 mg/dL — ABNORMAL HIGH (ref 70–99)

## 2013-05-05 SURGERY — COLONOSCOPY, ESOPHAGOGASTRODUODENOSCOPY (EGD) AND ESOPHAGEAL DILATION (ED)
Anesthesia: Monitor Anesthesia Care

## 2013-05-05 MED ORDER — PROPOFOL INFUSION 10 MG/ML OPTIME
INTRAVENOUS | Status: DC | PRN
  Administered 2013-05-05: 120 ug/kg/min via INTRAVENOUS

## 2013-05-05 MED ORDER — LACTATED RINGERS IV SOLN
INTRAVENOUS | Status: DC
  Administered 2013-05-05: 1000 mL via INTRAVENOUS

## 2013-05-05 MED ORDER — LANSOPRAZOLE 15 MG PO CPDR: 15.0000 mg | DELAYED_RELEASE_CAPSULE | Freq: Every day | ORAL | Status: DC

## 2013-05-05 MED ORDER — KETAMINE HCL 10 MG/ML IJ SOLN
INTRAMUSCULAR | Status: AC
  Filled 2013-05-05: qty 1

## 2013-05-05 MED ORDER — PROPOFOL 10 MG/ML IV BOLUS
INTRAVENOUS | Status: AC
  Filled 2013-05-05: qty 20

## 2013-05-05 MED ORDER — KETAMINE HCL 10 MG/ML IJ SOLN
INTRAMUSCULAR | Status: DC | PRN
  Administered 2013-05-05 (×2): 10 mg via INTRAVENOUS
  Administered 2013-05-05: 20 mg via INTRAVENOUS
  Administered 2013-05-05: 10 mg via INTRAVENOUS

## 2013-05-05 MED ORDER — SODIUM CHLORIDE 0.9 % IV SOLN: INTRAVENOUS | Status: DC

## 2013-05-05 MED ORDER — MIDAZOLAM HCL 5 MG/5ML IJ SOLN
INTRAMUSCULAR | Status: DC | PRN
  Administered 2013-05-05: 2 mg via INTRAVENOUS

## 2013-05-05 MED ORDER — MIDAZOLAM HCL 2 MG/2ML IJ SOLN
INTRAMUSCULAR | Status: AC
  Filled 2013-05-05: qty 2

## 2013-05-05 NOTE — Interval H&P Note (Signed)
History and Physical Interval Note:  05/05/2013 9:06 AM  Elizabeth Mcfarland  has presented today for surgery, with the diagnosis of Hematemesis/vomiting blood 578.0 Personal history of colon polyps  The various methods of treatment have been discussed with the patient and family. After consideration of risks, benefits and other options for treatment, the patient has consented to  Procedure(s): COLONOSCOPY, ESOPHAGOGASTRODUODENOSCOPY (EGD) AND ESOPHAGEAL DILATION (ED) (N/A) as a surgical intervention .  The patient's history has been reviewed, patient examined, no change in status, stable for surgery.  I have reviewed the patient's chart and labs.  Questions were answered to the patient's satisfaction.     Silvano Rusk

## 2013-05-05 NOTE — Anesthesia Preprocedure Evaluation (Addendum)
Anesthesia Evaluation  Patient identified by MRN, date of birth, ID band Patient awake    Reviewed: Allergy & Precautions, H&P , NPO status , Patient's Chart, lab work & pertinent test results  Airway Mallampati: II TM Distance: >3 FB Neck ROM: Full    Dental  (+) Edentulous Upper, Edentulous Lower   Pulmonary neg pulmonary ROS,    Pulmonary exam normal       Cardiovascular hypertension, Pt. on medications Rhythm:Regular Rate:Normal     Neuro/Psych negative neurological ROS  negative psych ROS   GI/Hepatic negative GI ROS, Neg liver ROS,   Endo/Other  diabetes, Poorly Controlled, Type 2, Oral Hypoglycemic Agents  Renal/GU negative Renal ROS  negative genitourinary   Musculoskeletal negative musculoskeletal ROS (+)   Abdominal   Peds  Hematology  (+) anemia ,   Anesthesia Other Findings   Reproductive/Obstetrics                          Anesthesia Physical Anesthesia Plan  ASA: III  Anesthesia Plan: MAC   Post-op Pain Management:    Induction: Intravenous  Airway Management Planned: Simple Face Mask and Nasal Cannula  Additional Equipment:   Intra-op Plan:   Post-operative Plan:   Informed Consent: I have reviewed the patients History and Physical, chart, labs and discussed the procedure including the risks, benefits and alternatives for the proposed anesthesia with the patient or authorized representative who has indicated his/her understanding and acceptance.   Dental advisory given  Plan Discussed with: CRNA  Anesthesia Plan Comments:         Anesthesia Quick Evaluation

## 2013-05-05 NOTE — H&P (View-Only) (Signed)
Subjective:    Patient ID: Elizabeth Mcfarland, female    DOB: [birth year], 73 y.o.   MRN: [id number]  HPI  Elizabeth Mcfarland is a pleasant 73 year old African American female known to Dr. Carlean Purl. She had undergone colonoscopy in February 2010 was found of a 5 mm polyp in the cecum which was a tubular adenoma she is due for 5 year followup. She comes in today because of an episode of coffee-ground emesis on 05/02/2013. She says she had been nauseated that day and vomited up black clumpy coffee-ground looking material on 2 occasions. She went to the emergency room and was evaluated , and found to have gastro-cult positive material  She was Hemoccult-negative area WBC was 10.8 hemoglobin 10.3 hematocrit of 30.8 MCV of 87. Her hemoglobin one year ago was documented at 13.5. BUN was also slightly elevated at 34 creatinine 0.77 She has been taking a baby aspirin daily for many years and says she takes an occasional Bayer aspirin for arthritis symptoms and has also been taking at least one Aleve every day over the past year for arthritis. She has not been having any abdominal pain recently ,has not noted any melena or hematochezia. She says she did notice some soreness in her epigastrium yesterday. Her appetite has been fine, no changes in symptoms with by mouth intake. No dysphagia or odynophagia. She was given a prescription for 15 mg Prevacid in the emergency room as well as Pepcid 20 mg twice daily. No further vomiting or hematemesis since that episode.    Review of Systems  HENT: Negative.   Eyes: Negative.   Respiratory: Negative.   Cardiovascular: Negative.   Gastrointestinal: Positive for vomiting.  Endocrine: Negative.   Genitourinary: Negative.   Musculoskeletal: Positive for arthralgias.  Skin: Negative.   Allergic/Immunologic: Negative.   Neurological: Positive for weakness.  Hematological: Negative.   Psychiatric/Behavioral: Negative.    Outpatient Prescriptions Prior to Visit  Medication Sig  Dispense Refill  . amLODipine (NORVASC) 10 MG tablet Take 10 mg by mouth every morning.       . cholecalciferol (VITAMIN D) 1000 UNITS tablet Take 1,000 Units by mouth every morning.       . famotidine (PEPCID) 20 MG tablet Take 1 tablet (20 mg total) by mouth 2 (two) times daily.  30 tablet  0  . glimepiride (AMARYL) 4 MG tablet Take 4 mg by mouth 2 (two) times daily.      . lansoprazole (PREVACID) 15 MG capsule Take 1 capsule (15 mg total) by mouth daily at 12 noon.  30 capsule  1  . metoCLOPramide (REGLAN) 10 MG tablet Take 1 tablet (10 mg total) by mouth every 6 (six) hours as needed (nausea/headache).  6 tablet  0  . sitaGLIPtin (JANUVIA) 100 MG tablet Take 100 mg by mouth daily.      . vitamin C (ASCORBIC ACID) 500 MG tablet Take 500 mg by mouth every morning.        No facility-administered medications prior to visit.   Allergies  Allergen Reactions  . Codeine     REACTION: nausea  . Percocet [Oxycodone-Acetaminophen] Nausea And Vomiting   Patient Active Problem List   Diagnosis Date Noted  . Personal history of colonic polyps 05/04/2013  . DIABETES MELLITUS, TYPE II 02/21/2008  . HYPERTENSION 02/21/2008  . IBS 02/21/2008  . NEPHROLITHIASIS, HX OF 02/21/2008   History  Substance Use Topics  . Smoking status: Never Smoker   . Smokeless tobacco: Never Used  . Alcohol Use:  No   family history includes Diabetes in her sister.     Objective:   Physical Exam   well-developed older after female in no acute distress, pleasant blood pressure 144/60 pulse 88 height 5 foot 6 weight 149. HEENT; nontraumatic normocephalic EOMI PERRLA sclera anicteric, Supple; no JVD, Cardiovascular; regular rate and rhythm with S1-S2 no murmur or gallop, Ulnar clear bilaterally, Abdomen soft nontender nondistended bowel sounds are active no palpable mass or hepatosplenomegaly, Rectal exams not done she was documented Hemoccult negative in the ER on 05/02/2013, Extremities; no clubbing cyanosis or edema  skin warm and dry, Psych; mood and affect normal and appropriate        Assessment & Plan:  #52  73 year old African American female with 2 episodes of coffee-ground emesis 48 hours ago on 05/02/2013. Patient currently asymptomatic no melena or hematochezia and no further emesis. Suspect aspirin/NSAID-induced gastropathy or peptic ulcer disease #2 normocytic anemia-possibly secondary to above #3 history of adenomatous colon polyps due for 5 year followup   #4 osteoarthritis #5 hypertension #6 adult onset diabetes mellitus  Plan; repeat CBC today Increase Prevacid to 30 mg daily Stop Pepcid for now Patient is asked to hold her aspirin and NSAIDs Have scheduled for upper endoscopy (as well as colonoscopy at patient's request )for tomorrow with Dr. Carlean Purl at [facility]. Procedures discussed in detail with patient and she is agreeable to proceed

## 2013-05-05 NOTE — Progress Notes (Signed)
Agree with Ms. Esterwood's assessment and plan. Cole Eastridge E. Lemonte Al, MD, FACG   

## 2013-05-05 NOTE — Op Note (Signed)
[facility] [place], [address]   COLONOSCOPY PROCEDURE REPORT  PATIENT: Elizabeth Mcfarland, Elizabeth Mcfarland  MR#: [id number] BIRTHDATE: [birth year] , 77  yrs. old GENDER: Female ENDOSCOPIST: Gatha Mayer, MD, [facility] PROCEDURE DATE:  05/05/2013 PROCEDURE:   Colonoscopy with snare polypectomy First Screening Colonoscopy - Avg.  risk and is 50 yrs.  old or older - No.  Prior Negative Screening - Now for repeat screening. N/A  History of Adenoma - Now for follow-up colonoscopy & has been > or = to 3 yrs.  Yes hx of adenoma.  Has been 3 or more years since last colonoscopy.  Polyps Removed Today? Yes. ASA CLASS:   Class II INDICATIONS:Patient's personal history of adenomatous colon polyps.  MEDICATIONS: See Anesthesia Report and MAC sedation, administered by CRNA  DESCRIPTION OF PROCEDURE:   After the risks benefits and alternatives of the procedure were thoroughly explained, informed consent was obtained.  A digital rectal exam revealed no abnormalities of the rectum.   The Pentax Adult Colonscope [id number] endoscope was introduced through the anus and advanced to the cecum, which was identified by both the appendix and ileocecal valve. No adverse events experienced.   The quality of the prep was adequate, using MoviPrep  The instrument was then slowly withdrawn as the colon was fully examined.      COLON FINDINGS: A sessile polyp measuring 8 mm in size was found in the ascending colon.  A polypectomy was performed with a cold snare.  The resection was complete and the polyp tissue was completely retrieved.   The colon mucosa was otherwise normal. Retroflexed views revealed no abnormalities. The time to cecum=9 minutes 0 seconds.  Withdrawal time=18 minutes 0 seconds.  The scope was withdrawn and the procedure completed. COMPLICATIONS: There were no complications.  ENDOSCOPIC IMPRESSION: 1.   Sessile polyp measuring 8 mm in size was found in the ascending colon;  polypectomy was performed with a cold snare 2.   The colon mucosa was otherwise normal - adequate prep  RECOMMENDATIONS: Timing of repeat colonoscopy will be determined by pathology findings in patient with prior adenoma 2010   eSigned:  Gatha Mayer, MD, [facility] 05/05/2013 10:18 AM   cc: Jeanann Lewandowsky MD and The Patient

## 2013-05-05 NOTE — Transfer of Care (Signed)
Immediate Anesthesia Transfer of Care Note  Patient: Elizabeth Mcfarland  Procedure(s) Performed: Procedure(s): COLONOSCOPY, ESOPHAGOGASTRODUODENOSCOPY (EGD) AND ESOPHAGEAL DILATION (ED) (N/A)  Patient Location: PACU and Endoscopy Unit  Anesthesia Type:MAC  Level of Consciousness: awake, sedated and responds to stimulation  Airway & Oxygen Therapy: Patient Spontanous Breathing and Patient connected to nasal cannula oxygen  Post-op Assessment: Report given to PACU RN and Post -op Vital signs reviewed and stable  Post vital signs: Reviewed and stable  Complications: No apparent anesthesia complications

## 2013-05-05 NOTE — Discharge Instructions (Addendum)
I found an ulcer in your esophagus - likely the result of acid reflux damage. You also have a small hiatal hernia - stomach moves into the chest - common and not dangerous and does not need to be fixed.  Please stay on lansoprazole (Prevacid) and take 30 mg before breakfast and if you have the 15 mg take it before supper until you run out. If not please get some of this over the counter and take before supper for 1 month. Stay on the 30 mg lansoprazole before breakfast - DO NOT STOP - these medicines will heal the ulcer and stop it from coming back.  I found and removed one polyp from your colon. It looks benign.  Your blood sugars have been high and it is important that you keep trying to get them under better control.  I will let you know pathology results and when to follow-up by phone.   I appreciate the opportunity to care for you. Gatha Mayer, MD, FACG   YOU HAD AN ENDOSCOPIC PROCEDURE TODAY: Refer to the procedure report and other information in the discharge instructions given to you for any specific questions about what was found during the examination. If this information does not answer your questions, please call Dr. Celesta Aver office at [phone] to clarify.   YOU SHOULD EXPECT: Some feelings of bloating in the abdomen. Passage of more gas than usual. Walking can help get rid of the air that was put into your GI tract during the procedure and reduce the bloating. If you had a lower endoscopy (such as a colonoscopy or flexible sigmoidoscopy) you may notice spotting of blood in your stool or on the toilet paper. Some abdominal soreness may be present for a day or two, also.  DIET: Your first meal following the procedure should be a light meal and then it is ok to progress to your normal diet. A half-sandwich or bowl of soup is an example of a good first meal. Heavy or fried foods are harder to digest and may make you feel nauseous or bloated. Drink plenty of fluids but you should  avoid alcoholic beverages for 24 hours.   ACTIVITY: Your care partner should take you home directly after the procedure. You should plan to take it easy, moving slowly for the rest of the day. You can resume normal activity the day after the procedure however YOU SHOULD NOT DRIVE, use power tools, machinery or perform tasks that involve climbing or major physical exertion for 24 hours (because of the sedation medicines used during the test).   SYMPTOMS TO REPORT IMMEDIATELY: A gastroenterologist can be reached at any hour. Please call [phone]  for any of the following symptoms:  Following lower endoscopy (colonoscopy, flexible sigmoidoscopy) Excessive amounts of blood in the stool  Significant tenderness, worsening of abdominal pains  Swelling of the abdomen that is new, acute  Fever of 100 or higher  Following upper endoscopy (EGD, EUS, ERCP, esophageal dilation) Vomiting of blood or coffee ground material  New, significant abdominal pain  New, significant chest pain or pain under the shoulder blades  Painful or persistently difficult swallowing  New shortness of breath  Black, tarry-looking or red, bloody stools  FOLLOW UP:  If any biopsies were taken you will be contacted by phone or by letter within the next 1-3 weeks. Call [phone]  if you have not heard about the biopsies in 3 weeks.  Please also call with any specific questions about appointments or follow up  tests.

## 2013-05-05 NOTE — Op Note (Signed)
[facility] [place], [address]   ENDOSCOPY PROCEDURE REPORT  PATIENT: Elizabeth, Mcfarland  MR#: [id number] BIRTHDATE: October 15, 1940 , 36  yrs. old GENDER: Female ENDOSCOPIST: Gatha Mayer, MD, [facility] PROCEDURE DATE:  05/05/2013 PROCEDURE:  EGD w/ biopsy ASA CLASS:     Class II INDICATIONS:  Hematemesis. MEDICATIONS: MAC sedation, administered by CRNA and See Anesthesia Report. TOPICAL ANESTHETIC: none  DESCRIPTION OF PROCEDURE: After the risks benefits and alternatives of the procedure were thoroughly explained, informed consent was obtained.  The    endoscope was introduced through the mouth and advanced to the second portion of the duodenum. Without limitations.  The instrument was slowly withdrawn as the mucosa was fully examined.        ESOPHAGUS: A single linear and clean-based ulcer measuring 2 x 55mm in size was found in the lower third of the esophagus.  Biopsies were taken at edge of the ulcer and at the center of the ulcer.   A 2 cm hiatal hernia was noted.  The remainder of the upper endoscopy exam was otherwise normal. Retroflexed views revealed a hiatal hernia.     The scope was then withdrawn from the patient and the procedure completed.  COMPLICATIONS: There were no complications. ENDOSCOPIC IMPRESSION: 1.   Single ulcer measuring 2 x 77mm in size was found in the lower third of the esophagus 2.   2 cm hiatal hernia 3.   The remainder of the upper endoscopy exam was otherwise normal  RECOMMENDATIONS: 1.  Continue PPI - must control blood sugars also - keep in mind Januvia can cause abdominal pain also (has had epigastric pain) 2.  Proceed with a Colonoscopy as planned (polyp follow-up)   eSigned:  Gatha Mayer, MD, [facility] 05/05/2013 10:12 AM   CC:The Patient and Jeanann Lewandowsky, MD

## 2013-05-06 ENCOUNTER — Encounter: Payer: Self-pay | Admitting: Internal Medicine

## 2013-05-06 ENCOUNTER — Encounter (HOSPITAL_COMMUNITY): Payer: Self-pay | Admitting: Internal Medicine

## 2013-05-06 NOTE — Progress Notes (Signed)
Quick Note:  Polyp benign - needs repeat colon recall 5 years please 04/2018 Esophageal ulcer - no cancer or infection - from acid reflux  Have her see me or APP in office in 6 weeks ______

## 2013-05-06 NOTE — Progress Notes (Signed)
Quick Note:  Copy Dr. Jeanann Lewandowsky with pathology and endoscopy reports ______

## 2013-05-06 NOTE — Anesthesia Postprocedure Evaluation (Signed)
Anesthesia Post Note  Patient: Elizabeth Mcfarland  Procedure(s) Performed: Procedure(s) (LRB): COLONOSCOPY, ESOPHAGOGASTRODUODENOSCOPY (EGD) AND ESOPHAGEAL DILATION (ED) (N/A)  Anesthesia type: MAC  Patient location: PACU  Post pain: Pain level controlled  Post assessment: Post-op Vital signs reviewed  Last Vitals:  Filed Vitals:   05/05/13 1030  BP: 173/89  Pulse:   Temp:   Resp: 20    Post vital signs: Reviewed  Level of consciousness: sedated  Complications: No apparent anesthesia complications

## 2013-06-02 DIAGNOSIS — IMO0001 Reserved for inherently not codable concepts without codable children: Secondary | ICD-10-CM | POA: Diagnosis not present

## 2013-06-02 DIAGNOSIS — K922 Gastrointestinal hemorrhage, unspecified: Secondary | ICD-10-CM | POA: Diagnosis not present

## 2013-06-02 DIAGNOSIS — K2960 Other gastritis without bleeding: Secondary | ICD-10-CM | POA: Diagnosis not present

## 2013-06-02 DIAGNOSIS — I10 Essential (primary) hypertension: Secondary | ICD-10-CM | POA: Diagnosis not present

## 2013-06-21 ENCOUNTER — Ambulatory Visit (INDEPENDENT_AMBULATORY_CARE_PROVIDER_SITE_OTHER): Payer: Medicare Other | Admitting: Internal Medicine

## 2013-06-21 ENCOUNTER — Encounter: Payer: Self-pay | Admitting: Internal Medicine

## 2013-06-21 VITALS — BP 134/68 | HR 76 | Ht 66.0 in | Wt 143.2 lb

## 2013-06-21 DIAGNOSIS — D649 Anemia, unspecified: Secondary | ICD-10-CM

## 2013-06-21 DIAGNOSIS — K2211 Ulcer of esophagus with bleeding: Secondary | ICD-10-CM

## 2013-06-21 NOTE — Assessment & Plan Note (Signed)
Symptoms gone Stay on PPI

## 2013-06-21 NOTE — Assessment & Plan Note (Signed)
Keep f/u Dr. Carlis Abbott

## 2013-06-21 NOTE — Patient Instructions (Signed)
Glad your better, keep your follow up appointment with Dr. Carlis Abbott and follow up with [place] as needed.   I appreciate the opportunity to care for you.

## 2013-06-21 NOTE — Progress Notes (Signed)
         Subjective:    Patient ID: Elizabeth Mcfarland, female    DOB: [birth year], 73 y.o.   MRN: [id number]  HPI Patient is in for followup. She did experience some minor hematemesis or coffee ground emesis and was seen in the office. Colonoscopy and EGD were performed the for polyp followup and to investigate the hematemesis. She had some small esophageal ulcers at the GE junction and PPI therapy was recommended and she has not had recurrence of her problems. She had some anemia and is following up with Dr. Carlis Abbott on this. Colonoscopy demonstrated a colon polyp is adenomatous I plan for repeat routine colonoscopy in 5 years most likely. She says she has been eating differently and has lost some weight.  Medications, allergies, past medical history, past surgical history, family history and social history are reviewed and updated in the EMR.  Review of Systems As above    Objective:   Physical Exam elderly black woman looking younger than stated age       Assessment & Plan:  Esophageal ulcer with bleeding Symptoms gone Stay on PPI  Anemia Keep f/u Dr. Carlis Abbott   CC: Foye Spurling, MD

## 2013-07-07 DIAGNOSIS — M5412 Radiculopathy, cervical region: Secondary | ICD-10-CM | POA: Diagnosis not present

## 2013-07-07 DIAGNOSIS — I10 Essential (primary) hypertension: Secondary | ICD-10-CM | POA: Diagnosis not present

## 2013-07-07 DIAGNOSIS — IMO0001 Reserved for inherently not codable concepts without codable children: Secondary | ICD-10-CM | POA: Diagnosis not present

## 2013-08-18 DIAGNOSIS — M5412 Radiculopathy, cervical region: Secondary | ICD-10-CM | POA: Diagnosis not present

## 2013-08-18 DIAGNOSIS — I10 Essential (primary) hypertension: Secondary | ICD-10-CM | POA: Diagnosis not present

## 2013-08-18 DIAGNOSIS — IMO0001 Reserved for inherently not codable concepts without codable children: Secondary | ICD-10-CM | POA: Diagnosis not present

## 2013-09-13 DIAGNOSIS — H43819 Vitreous degeneration, unspecified eye: Secondary | ICD-10-CM | POA: Diagnosis not present

## 2013-11-03 DIAGNOSIS — Z0000 Encounter for general adult medical examination without abnormal findings: Secondary | ICD-10-CM | POA: Diagnosis not present

## 2014-01-12 DIAGNOSIS — I10 Essential (primary) hypertension: Secondary | ICD-10-CM | POA: Diagnosis not present

## 2014-01-12 DIAGNOSIS — Z0001 Encounter for general adult medical examination with abnormal findings: Secondary | ICD-10-CM | POA: Diagnosis not present

## 2014-01-12 DIAGNOSIS — E119 Type 2 diabetes mellitus without complications: Secondary | ICD-10-CM | POA: Diagnosis not present

## 2014-01-31 DIAGNOSIS — R3915 Urgency of urination: Secondary | ICD-10-CM | POA: Diagnosis not present

## 2014-01-31 DIAGNOSIS — E559 Vitamin D deficiency, unspecified: Secondary | ICD-10-CM | POA: Diagnosis not present

## 2014-01-31 DIAGNOSIS — E119 Type 2 diabetes mellitus without complications: Secondary | ICD-10-CM | POA: Diagnosis not present

## 2014-01-31 DIAGNOSIS — N342 Other urethritis: Secondary | ICD-10-CM | POA: Diagnosis not present

## 2014-01-31 DIAGNOSIS — I10 Essential (primary) hypertension: Secondary | ICD-10-CM | POA: Diagnosis not present

## 2014-03-28 ENCOUNTER — Other Ambulatory Visit: Payer: Self-pay | Admitting: Internal Medicine

## 2014-03-28 DIAGNOSIS — Z1231 Encounter for screening mammogram for malignant neoplasm of breast: Secondary | ICD-10-CM

## 2014-04-17 ENCOUNTER — Other Ambulatory Visit: Payer: Self-pay | Admitting: Internal Medicine

## 2014-04-17 DIAGNOSIS — K920 Hematemesis: Secondary | ICD-10-CM

## 2014-04-17 DIAGNOSIS — R319 Hematuria, unspecified: Secondary | ICD-10-CM

## 2014-04-17 DIAGNOSIS — M5440 Lumbago with sciatica, unspecified side: Secondary | ICD-10-CM

## 2014-04-17 DIAGNOSIS — D1800 Hemangioma unspecified site: Secondary | ICD-10-CM

## 2014-04-17 DIAGNOSIS — R109 Unspecified abdominal pain: Secondary | ICD-10-CM

## 2014-04-17 DIAGNOSIS — R58 Hemorrhage, not elsewhere classified: Secondary | ICD-10-CM

## 2014-04-25 ENCOUNTER — Ambulatory Visit (HOSPITAL_COMMUNITY)
Admission: RE | Admit: 2014-04-25 | Discharge: 2014-04-25 | Disposition: A | Discharge status: Home / Self Care | Payer: Medicare Other | Source: Ambulatory Visit | Attending: Internal Medicine | Admitting: Internal Medicine

## 2014-04-25 DIAGNOSIS — Z90710 Acquired absence of both cervix and uterus: Secondary | ICD-10-CM | POA: Insufficient documentation

## 2014-04-25 DIAGNOSIS — R109 Unspecified abdominal pain: Secondary | ICD-10-CM | POA: Diagnosis not present

## 2014-04-25 DIAGNOSIS — N281 Cyst of kidney, acquired: Secondary | ICD-10-CM | POA: Insufficient documentation

## 2014-04-25 DIAGNOSIS — M5440 Lumbago with sciatica, unspecified side: Secondary | ICD-10-CM

## 2014-04-25 DIAGNOSIS — R319 Hematuria, unspecified: Secondary | ICD-10-CM

## 2014-04-27 ENCOUNTER — Ambulatory Visit (HOSPITAL_COMMUNITY)
Admission: RE | Admit: 2014-04-27 | Discharge: 2014-04-27 | Disposition: A | Discharge status: Home / Self Care | Payer: Medicare Other | Source: Ambulatory Visit | Attending: Internal Medicine | Admitting: Internal Medicine

## 2014-04-27 DIAGNOSIS — Z1231 Encounter for screening mammogram for malignant neoplasm of breast: Secondary | ICD-10-CM | POA: Diagnosis not present

## 2014-06-29 DIAGNOSIS — K048 Radicular cyst: Secondary | ICD-10-CM | POA: Diagnosis not present

## 2014-07-03 DIAGNOSIS — I10 Essential (primary) hypertension: Secondary | ICD-10-CM | POA: Diagnosis not present

## 2014-07-03 DIAGNOSIS — E119 Type 2 diabetes mellitus without complications: Secondary | ICD-10-CM | POA: Diagnosis not present

## 2014-07-03 DIAGNOSIS — E780 Pure hypercholesterolemia: Secondary | ICD-10-CM | POA: Diagnosis not present

## 2014-07-07 DIAGNOSIS — K048 Radicular cyst: Secondary | ICD-10-CM | POA: Diagnosis not present

## 2014-09-05 DIAGNOSIS — E11319 Type 2 diabetes mellitus with unspecified diabetic retinopathy without macular edema: Secondary | ICD-10-CM | POA: Diagnosis not present

## 2014-09-05 DIAGNOSIS — H40013 Open angle with borderline findings, low risk, bilateral: Secondary | ICD-10-CM | POA: Diagnosis not present

## 2014-09-05 DIAGNOSIS — Z961 Presence of intraocular lens: Secondary | ICD-10-CM | POA: Diagnosis not present

## 2014-10-23 DIAGNOSIS — M150 Primary generalized (osteo)arthritis: Secondary | ICD-10-CM | POA: Diagnosis not present

## 2014-10-23 DIAGNOSIS — E119 Type 2 diabetes mellitus without complications: Secondary | ICD-10-CM | POA: Diagnosis not present

## 2014-10-23 DIAGNOSIS — I10 Essential (primary) hypertension: Secondary | ICD-10-CM | POA: Diagnosis not present

## 2015-01-07 DIAGNOSIS — Z23 Encounter for immunization: Secondary | ICD-10-CM | POA: Diagnosis not present

## 2015-02-26 DIAGNOSIS — E538 Deficiency of other specified B group vitamins: Secondary | ICD-10-CM | POA: Diagnosis not present

## 2015-02-26 DIAGNOSIS — I10 Essential (primary) hypertension: Secondary | ICD-10-CM | POA: Diagnosis not present

## 2015-02-26 DIAGNOSIS — E109 Type 1 diabetes mellitus without complications: Secondary | ICD-10-CM | POA: Diagnosis not present

## 2015-02-26 DIAGNOSIS — M150 Primary generalized (osteo)arthritis: Secondary | ICD-10-CM | POA: Diagnosis not present

## 2015-02-26 DIAGNOSIS — E119 Type 2 diabetes mellitus without complications: Secondary | ICD-10-CM | POA: Diagnosis not present

## 2015-02-26 DIAGNOSIS — D649 Anemia, unspecified: Secondary | ICD-10-CM | POA: Diagnosis not present

## 2015-04-05 ENCOUNTER — Other Ambulatory Visit: Payer: Self-pay

## 2015-04-05 DIAGNOSIS — Z1231 Encounter for screening mammogram for malignant neoplasm of breast: Secondary | ICD-10-CM

## 2015-05-04 ENCOUNTER — Ambulatory Visit
Admission: RE | Admit: 2015-05-04 | Discharge: 2015-05-04 | Disposition: A | Discharge status: Home / Self Care | Payer: Medicare Other | Source: Ambulatory Visit

## 2015-05-04 DIAGNOSIS — Z1231 Encounter for screening mammogram for malignant neoplasm of breast: Secondary | ICD-10-CM | POA: Diagnosis not present

## 2015-06-04 DIAGNOSIS — M150 Primary generalized (osteo)arthritis: Secondary | ICD-10-CM | POA: Diagnosis not present

## 2015-06-04 DIAGNOSIS — E559 Vitamin D deficiency, unspecified: Secondary | ICD-10-CM | POA: Diagnosis not present

## 2015-06-04 DIAGNOSIS — N342 Other urethritis: Secondary | ICD-10-CM | POA: Diagnosis not present

## 2015-06-04 DIAGNOSIS — R109 Unspecified abdominal pain: Secondary | ICD-10-CM | POA: Diagnosis not present

## 2015-06-04 DIAGNOSIS — I10 Essential (primary) hypertension: Secondary | ICD-10-CM | POA: Diagnosis not present

## 2015-06-04 DIAGNOSIS — E119 Type 2 diabetes mellitus without complications: Secondary | ICD-10-CM | POA: Diagnosis not present

## 2015-10-03 ENCOUNTER — Other Ambulatory Visit: Payer: Self-pay | Admitting: Internal Medicine

## 2015-10-03 DIAGNOSIS — M150 Primary generalized (osteo)arthritis: Secondary | ICD-10-CM | POA: Diagnosis not present

## 2015-10-03 DIAGNOSIS — R109 Unspecified abdominal pain: Secondary | ICD-10-CM | POA: Diagnosis not present

## 2015-10-03 DIAGNOSIS — M2550 Pain in unspecified joint: Secondary | ICD-10-CM | POA: Diagnosis not present

## 2015-10-03 DIAGNOSIS — E559 Vitamin D deficiency, unspecified: Secondary | ICD-10-CM | POA: Diagnosis not present

## 2015-10-03 DIAGNOSIS — I10 Essential (primary) hypertension: Secondary | ICD-10-CM | POA: Diagnosis not present

## 2015-10-03 DIAGNOSIS — E119 Type 2 diabetes mellitus without complications: Secondary | ICD-10-CM | POA: Diagnosis not present

## 2015-10-04 ENCOUNTER — Ambulatory Visit (HOSPITAL_COMMUNITY)
Admission: RE | Admit: 2015-10-04 | Discharge: 2015-10-04 | Disposition: A | Discharge status: Home / Self Care | Payer: Medicare Other | Source: Ambulatory Visit | Attending: Internal Medicine | Admitting: Internal Medicine

## 2015-10-04 DIAGNOSIS — N281 Cyst of kidney, acquired: Secondary | ICD-10-CM | POA: Diagnosis not present

## 2015-10-04 DIAGNOSIS — N200 Calculus of kidney: Secondary | ICD-10-CM | POA: Diagnosis not present

## 2015-10-04 DIAGNOSIS — R911 Solitary pulmonary nodule: Secondary | ICD-10-CM | POA: Insufficient documentation

## 2015-10-04 DIAGNOSIS — R109 Unspecified abdominal pain: Secondary | ICD-10-CM | POA: Insufficient documentation

## 2015-10-24 DIAGNOSIS — N200 Calculus of kidney: Secondary | ICD-10-CM | POA: Diagnosis not present

## 2015-12-06 DIAGNOSIS — I10 Essential (primary) hypertension: Secondary | ICD-10-CM | POA: Diagnosis not present

## 2015-12-06 DIAGNOSIS — E119 Type 2 diabetes mellitus without complications: Secondary | ICD-10-CM | POA: Diagnosis not present

## 2015-12-06 DIAGNOSIS — Z23 Encounter for immunization: Secondary | ICD-10-CM | POA: Diagnosis not present

## 2015-12-06 DIAGNOSIS — R1011 Right upper quadrant pain: Secondary | ICD-10-CM | POA: Diagnosis not present

## 2015-12-10 DIAGNOSIS — H40013 Open angle with borderline findings, low risk, bilateral: Secondary | ICD-10-CM | POA: Diagnosis not present

## 2015-12-10 DIAGNOSIS — H43813 Vitreous degeneration, bilateral: Secondary | ICD-10-CM | POA: Diagnosis not present

## 2015-12-10 DIAGNOSIS — Z961 Presence of intraocular lens: Secondary | ICD-10-CM | POA: Diagnosis not present

## 2015-12-10 DIAGNOSIS — E119 Type 2 diabetes mellitus without complications: Secondary | ICD-10-CM | POA: Diagnosis not present

## 2015-12-10 DIAGNOSIS — H1045 Other chronic allergic conjunctivitis: Secondary | ICD-10-CM | POA: Diagnosis not present

## 2015-12-10 DIAGNOSIS — Z7984 Long term (current) use of oral hypoglycemic drugs: Secondary | ICD-10-CM | POA: Diagnosis not present

## 2015-12-23 ENCOUNTER — Encounter (HOSPITAL_COMMUNITY): Payer: Self-pay | Admitting: *Deleted

## 2015-12-23 ENCOUNTER — Encounter (HOSPITAL_COMMUNITY): Payer: Self-pay

## 2015-12-23 ENCOUNTER — Observation Stay (HOSPITAL_COMMUNITY)
Admission: EM | Admit: 2015-12-23 | Discharge: 2015-12-24 | Disposition: A | Discharge status: Home / Self Care | Payer: Medicare Other | Attending: Family Medicine | Admitting: Family Medicine

## 2015-12-23 ENCOUNTER — Emergency Department (HOSPITAL_COMMUNITY): Payer: Medicare Other

## 2015-12-23 ENCOUNTER — Ambulatory Visit (INDEPENDENT_AMBULATORY_CARE_PROVIDER_SITE_OTHER)
Admission: EM | Admit: 2015-12-23 | Discharge: 2015-12-23 | Disposition: A | Discharge status: Nursing Home (Includes ICF) | Payer: Medicare Other | Source: Home / Self Care | Attending: Family Medicine | Admitting: Family Medicine

## 2015-12-23 DIAGNOSIS — Z79899 Other long term (current) drug therapy: Secondary | ICD-10-CM | POA: Insufficient documentation

## 2015-12-23 DIAGNOSIS — R072 Precordial pain: Secondary | ICD-10-CM | POA: Diagnosis not present

## 2015-12-23 DIAGNOSIS — I10 Essential (primary) hypertension: Secondary | ICD-10-CM | POA: Insufficient documentation

## 2015-12-23 DIAGNOSIS — Z7984 Long term (current) use of oral hypoglycemic drugs: Secondary | ICD-10-CM | POA: Diagnosis not present

## 2015-12-23 DIAGNOSIS — R079 Chest pain, unspecified: Secondary | ICD-10-CM | POA: Diagnosis not present

## 2015-12-23 DIAGNOSIS — R0789 Other chest pain: Secondary | ICD-10-CM

## 2015-12-23 DIAGNOSIS — I119 Hypertensive heart disease without heart failure: Secondary | ICD-10-CM | POA: Diagnosis present

## 2015-12-23 DIAGNOSIS — E119 Type 2 diabetes mellitus without complications: Secondary | ICD-10-CM

## 2015-12-23 LAB — CBC
HEMATOCRIT: 38.5 % (ref 36.0–46.0)
Hemoglobin: 12.4 g/dL (ref 12.0–15.0)
MCH: 29.5 pg (ref 26.0–34.0)
MCHC: 32.2 g/dL (ref 30.0–36.0)
MCV: 91.4 fL (ref 78.0–100.0)
Platelets: 302 10*3/uL (ref 150–400)
RBC: 4.21 MIL/uL (ref 3.87–5.11)
RDW: 12.4 % (ref 11.5–15.5)
WBC: 6.9 10*3/uL (ref 4.0–10.5)

## 2015-12-23 LAB — BASIC METABOLIC PANEL
Anion gap: 9 (ref 5–15)
BUN: 11 mg/dL (ref 6–20)
CO2: 26 mmol/L (ref 22–32)
CREATININE: 0.73 mg/dL (ref 0.44–1.00)
Calcium: 9.8 mg/dL (ref 8.9–10.3)
Chloride: 103 mmol/L (ref 101–111)
GFR calc Af Amer: >60 mL/min (ref >60)
GLUCOSE: 151 mg/dL — AB (ref 65–99)
POTASSIUM: 4.1 mmol/L (ref 3.5–5.1)
Sodium: 138 mmol/L (ref 135–145)

## 2015-12-23 LAB — GLUCOSE, CAPILLARY: GLUCOSE-CAPILLARY: 228 mg/dL — AB (ref 65–99)

## 2015-12-23 LAB — I-STAT TROPONIN, ED: Troponin i, poc: 0 ng/mL (ref 0.00–0.08)

## 2015-12-23 LAB — TROPONIN I: Troponin I: <0.03 ng/mL (ref <0.03)

## 2015-12-23 LAB — TSH: TSH: 1.577 u[IU]/mL (ref 0.350–4.500)

## 2015-12-23 MED ORDER — INSULIN ASPART 100 UNIT/ML ~~LOC~~ SOLN: 0.0000 [IU] | Freq: Three times a day (TID) | SUBCUTANEOUS | Status: DC

## 2015-12-23 MED ORDER — ASPIRIN 300 MG RE SUPP: 300.0000 mg | RECTAL | Status: DC

## 2015-12-23 MED ORDER — ACETAMINOPHEN 325 MG PO TABS: 650.0000 mg | ORAL_TABLET | ORAL | Status: DC | PRN

## 2015-12-23 MED ORDER — ATORVASTATIN CALCIUM 40 MG PO TABS: 40.0000 mg | ORAL_TABLET | Freq: Every day | ORAL | Status: DC

## 2015-12-23 MED ORDER — SODIUM CHLORIDE 0.9% FLUSH
3.0000 mL | Freq: Two times a day (BID) | INTRAVENOUS | Status: DC
  Administered 2015-12-24: 3 mL via INTRAVENOUS

## 2015-12-23 MED ORDER — SODIUM CHLORIDE 0.9 % IV SOLN: 250.0000 mL | INTRAVENOUS | Status: DC | PRN

## 2015-12-23 MED ORDER — ASPIRIN 81 MG PO CHEW: 324.0000 mg | CHEWABLE_TABLET | ORAL | Status: DC

## 2015-12-23 MED ORDER — ENOXAPARIN SODIUM 40 MG/0.4ML ~~LOC~~ SOLN: 40.0000 mg | SUBCUTANEOUS | Status: DC

## 2015-12-23 MED ORDER — ONDANSETRON HCL 4 MG/2ML IJ SOLN: 4.0000 mg | Freq: Four times a day (QID) | INTRAMUSCULAR | Status: DC | PRN

## 2015-12-23 MED ORDER — ASPIRIN EC 81 MG PO TBEC
81.0000 mg | DELAYED_RELEASE_TABLET | Freq: Every day | ORAL | Status: DC
  Administered 2015-12-24: 81 mg via ORAL
  Filled 2015-12-23: qty 1

## 2015-12-23 MED ORDER — SODIUM CHLORIDE 0.9% FLUSH: 3.0000 mL | INTRAVENOUS | Status: DC | PRN

## 2015-12-23 MED ORDER — NITROGLYCERIN 0.4 MG SL SUBL: 0.4000 mg | SUBLINGUAL_TABLET | SUBLINGUAL | Status: DC | PRN

## 2015-12-23 MED ORDER — AMLODIPINE BESYLATE 10 MG PO TABS
10.0000 mg | ORAL_TABLET | Freq: Every day | ORAL | Status: DC
  Administered 2015-12-24: 10 mg via ORAL
  Filled 2015-12-23: qty 1

## 2015-12-23 NOTE — ED Notes (Signed)
Dinner tray ordered, diabetic diet

## 2015-12-23 NOTE — ED Triage Notes (Signed)
Pt brought in by [facility] from [place] for c/o chest pain today that started while in church. Pt is chest pain free at this time. Pt a&ox4.

## 2015-12-23 NOTE — H&P (Signed)
[facility] Admission History and Physical Service Pager: [phone]  Patient name: Elizabeth Mcfarland Medical record number: [id number] Date of birth: [birth year] Age: 75 y.o. Gender: female  Primary Care Provider: Foye Spurling, MD Consultants: Cardiology in a.m.  Code Status: Full (per discussion on admission)  Chief Complaint: chest pain  Assessment and Plan: Elizabeth Mcfarland is a 76 y.o. female presenting with chest pain. PMH is significant for hypertension, diabetes, anemia.  ACS R/O: Differentials include stable angina vs unstable angina vs atypical chest pain vs GERD. Asymptomatic at this time with normal physical exam. Initial troponin 0.00 and EKG showing no acute ST changes. Chest x-ray unremarkable. BMP and CBC normal. No significant cardiac history although is at risk with diabetes and hypertension. Last cardiac testing was 25 years ago per patient. -Admit to telemetry, attending Dr. Gwendlyn Deutscher -Vital signs per floor protocol -Continuous cardiac monitoring -Cardiology consult in the morning -A.m. EKG -Continue to trend troponins - ASA - Add lipitor 40mg  next line - Will  hold off on beta blocker as patient was slightly bradycardic with heart rate into the 50s. -Nitroglycerin when necessary for chest pain  Hypertension: Currently at goal with blood pressure 152/79, was 186/91 on arrival to ED. Home medications include amlodipine 10 mg daily -Resume home medications -Monitor blood pressure  Diabetes mellitus: No hemoglobin A1c on file. Home medications include Amaryl 2 mg daily at bedtime and Janumet 50-500 milligrams daily with breakfast -Hold home medications while inpatient -Sensitive sliding scale insulin next, can increase to moderate if glucose uncontrolled -CBGs 4 times daily before meals and at bedtime -Hemoglobin A1c pending  Hx of Anemia: Stable hemoglobin of 12.4. Takes iron daily -Hold home iron for now  FEN/GI: Saline lock IV, heart healthy  carb modified diet Prophylaxis: Lovenox  Disposition: Admit to telemetry, attending Dr. Gwendlyn Deutscher  History of Present Illness:  Elizabeth Mcfarland is a 75 y.o. female presenting with chest pain  Patient states she was in church today when she developed a sudden pain in the center of her chest. The pain felt like  "pressure" for about 15-20 minutes and was associated with some jaw discomfort. The pain resolved but her BP was noted to be 190/72. She then walked to the back of the church and was taken to urgent care. She was given full dose aspirin at church. She then went to the ED by ambulance who gave her a nitroglycerin. This type of pain has never happened to her before. Of note she has had kidney stone pain before but does not feeel like this is related. No dizziness, sweating, shortness of breath.   Currently she is completely asymptomatic and denies any chest pain or jaw discomfort.. She really wants to go home at this time.  ED course; EKG unremarkable with normal labs and chest x-ray. Was not given any medications.  Review Of Systems: Per HPI with the following additions: see HPI  Review of Systems  Constitutional: Negative for chills, fever and weight loss.  HENT: Negative for sore throat.   Eyes: Negative for pain.  Respiratory: Negative for cough, hemoptysis, shortness of breath and wheezing.   Cardiovascular: Positive for chest pain. Negative for palpitations, orthopnea, claudication, leg swelling and PND.  Gastrointestinal: Negative for abdominal pain, constipation, diarrhea, heartburn, nausea and vomiting.  Genitourinary: Negative for dysuria.  Musculoskeletal: Negative for falls.  Skin: Negative for rash.  Neurological: Negative for dizziness, focal weakness, weakness and headaches.  Psychiatric/Behavioral: Negative for depression.    Patient Active Problem  List   Diagnosis Date Noted  . Chest pain 12/23/2015  . Anemia 06/21/2013  . Esophageal ulcer with bleeding 05/05/2013  .  Personal history of colonic polyps 05/04/2013  . Type II diabetes mellitus with manifestations, uncontrolled ([place]) 02/21/2008  . HYPERTENSION 02/21/2008  . IBS 02/21/2008  . NEPHROLITHIASIS, HX OF 02/21/2008    Past Medical History: Past Medical History:  Diagnosis Date  . Adenomatous colon polyp   . Anemia   . Diabetes mellitus without complication ([place])   . Esophageal ulcer with bleeding 05/05/2013   05/05/2013 EGD linear distal esophageal ulcer - cause of hematemesis prior to EGD   . Hypertension     Past Surgical History: Past Surgical History:  Procedure Laterality Date  . ABDOMINAL HYSTERECTOMY    . COLONOSCOPY, ESOPHAGOGASTRODUODENOSCOPY (EGD) AND ESOPHAGEAL DILATION N/A 05/05/2013   Procedure: COLONOSCOPY, ESOPHAGOGASTRODUODENOSCOPY (EGD) AND ESOPHAGEAL DILATION (ED);  Surgeon: Gatha Mayer, MD;  Location: [facility];  Service: Endoscopy;  Laterality: N/A;  . LITHOTRIPSY      Social History: Social History  Substance Use Topics  . Smoking status: Never Smoker  . Smokeless tobacco: Never Used  . Alcohol use No   Please also refer to relevant sections of EMR.  Family History: Family History  Problem Relation Age of Onset  . Diabetes Sister     Allergies and Medications: Allergies  Allergen Reactions  . Codeine Nausea And Vomiting  . Percocet [Oxycodone-Acetaminophen] Nausea And Vomiting   No current facility-administered medications on file prior to encounter.    Current Outpatient Prescriptions on File Prior to Encounter  Medication Sig Dispense Refill  . amLODipine (NORVASC) 10 MG tablet Take 10 mg by mouth daily.     . cholecalciferol (VITAMIN D) 1000 UNITS tablet Take 1,000 Units by mouth daily.     Marland Kitchen glimepiride (AMARYL) 4 MG tablet Take 2 mg by mouth at bedtime.     . lansoprazole (PREVACID) 30 MG capsule Take 30 mg by mouth daily before breakfast.      Objective: BP 152/79   Pulse (!) 56   Temp 97.6 F (36.4 C) (Oral)   Resp 14   SpO2 94%   Exam: General: Pleasant and happy elderly female laying in bed in no acute distress Eyes: Pupils equal and reactive to light although myotic, no discharge ENTM: Normocephalic atraumatic. No nasal discharge. Moist mucous membranes no pharyngeal erythema. Dentures noted. Neck: Supple, no JVD  Cardiovascular: Mild bradycardic rate, regular rhythm, no murmurs appreciated, no edema, 2+ DP pulses bilaterally Respiratory: Normal work of breathing, clear to auscultation bilaterally Gastrointestinal: Soft, nondistended, nontender, normal bowel sounds MSK: Normal range of motion Derm: No rash Neuro: Alert and oriented 3, 5 out of 5 strength in bilateral upper and lower extremities, sensation grossly intact throughout, did not observe gait Psych: Normal mood and affect  Labs and Imaging: CBC BMET   Recent Labs Lab 12/23/15 1710  WBC 6.9  HGB 12.4  HCT 38.5  PLT 302    Recent Labs Lab 12/23/15 1710  NA 138  K 4.1  CL 103  CO2 26  BUN 11  CREATININE 0.73  GLUCOSE 151*  CALCIUM 9.8     Dg Chest 2 View  Result Date: 12/23/2015 CLINICAL DATA:  Chest pain. EXAM: CHEST  2 VIEW COMPARISON:  CT scan of October 15, 2009. FINDINGS: The heart size and mediastinal contours are within normal limits. Both lungs are clear. No pneumothorax or pleural effusion is noted. The visualized skeletal structures are unremarkable.  IMPRESSION: No active cardiopulmonary disease. Electronically Signed   By: Marijo Conception, M.D.   On: 12/23/2015 18:14     Carlyle Dolly, MD 12/23/2015, 7:32 PM PGY-2, [place] Intern pager: [phone], text pages welcome

## 2015-12-23 NOTE — ED Notes (Signed)
Report  Phoned  To  [employer] nurse

## 2015-12-23 NOTE — H&P (Signed)
Patient evaluated with the resident at [place] today. I will co-sign note once it is available.

## 2015-12-23 NOTE — ED Provider Notes (Signed)
Emergency Department Provider Note   I have reviewed the triage vital signs and the nursing notes.   HISTORY  Chief Complaint Chest Pain   HPI Elizabeth Mcfarland is a 75 y.o. female with PMH of DM and HTN presents to the emergency department for evaluation of chest pain and jaw discomfort while at church today. Patient states that she began having mild discomfort in her back which soon moved to her anterior chest. She reports having pain on both sides of her chest with the right being worse than the left. Shortly after she developed jaw discomfort and alerted the first responders at church who referred her to the emergency department.  Patient initially stopped at an urgent care and was soon transported by ambulance to the emergency department. She was given full dose aspirin at church. She was given nitroglycerin in route but was chest pain free on arrival to the outside urgent care.    Past Medical History:  Diagnosis Date  . Adenomatous colon polyp   . Anemia   . Diabetes mellitus without complication ([place])   . Esophageal ulcer with bleeding 05/05/2013   05/05/2013 EGD linear distal esophageal ulcer - cause of hematemesis prior to EGD   . Hypertension     Patient Active Problem List   Diagnosis Date Noted  . Chest pain 12/23/2015  . Essential hypertension   . Diabetes mellitus type 2 in nonobese ([facility])   . Anemia 06/21/2013  . Esophageal ulcer with bleeding 05/05/2013  . Personal history of colonic polyps 05/04/2013  . Type II diabetes mellitus with manifestations, uncontrolled ([place]) 02/21/2008  . HYPERTENSION 02/21/2008  . IBS 02/21/2008  . NEPHROLITHIASIS, HX OF 02/21/2008    Past Surgical History:  Procedure Laterality Date  . ABDOMINAL HYSTERECTOMY    . COLONOSCOPY, ESOPHAGOGASTRODUODENOSCOPY (EGD) AND ESOPHAGEAL DILATION N/A 05/05/2013   Procedure: COLONOSCOPY, ESOPHAGOGASTRODUODENOSCOPY (EGD) AND ESOPHAGEAL DILATION (ED);  Surgeon: Gatha Mayer, MD;  Location: [facility];  Service: Endoscopy;  Laterality: N/A;  . LITHOTRIPSY        Allergies Codeine and Percocet [oxycodone-acetaminophen]  Family History  Problem Relation Age of Onset  . Diabetes Sister   . CAD Maternal Grandmother     Social History Social History  Substance Use Topics  . Smoking status: Never Smoker  . Smokeless tobacco: Never Used  . Alcohol use No    Review of Systems  Constitutional: No fever/chills Eyes: No visual changes. ENT: No sore throat. Cardiovascular: Positive chest pain and jaw pain.  Respiratory: Denies shortness of breath. Gastrointestinal: No abdominal pain.  No nausea, no vomiting.  No diarrhea.  No constipation. Genitourinary: Negative for dysuria. Musculoskeletal: Negative for back pain. Skin: Negative for rash. Neurological: Negative for headaches, focal weakness or numbness.  10-point ROS otherwise negative.  ____________________________________________   PHYSICAL EXAM:  VITAL SIGNS: ED Triage Vitals  Enc Vitals Group     BP 12/23/15 1644 163/68     Pulse Rate 12/23/15 1700 69     Resp 12/23/15 1644 18     Temp 12/23/15 1644 97.6 F (36.4 C)     Temp Source 12/23/15 1644 Oral     SpO2 12/23/15 1644 98 %    Constitutional: Alert and oriented. Well appearing and in no acute distress. Eyes: Conjunctivae are normal. PERRL. EOMI. Head: Atraumatic. Nose: No congestion/rhinnorhea. Mouth/Throat: Mucous membranes are moist.  Oropharynx non-erythematous. Neck: No stridor.   Cardiovascular: Normal rate, regular rhythm. Good peripheral circulation. Grossly normal heart sounds.   Respiratory:  Normal respiratory effort.  No retractions. Lungs CTAB. Gastrointestinal: Soft and nontender. No distention.  Musculoskeletal: No lower extremity tenderness nor edema. No gross deformities of extremities. Neurologic:  Normal speech and language. No gross focal neurologic deficits are appreciated.  Skin:  Skin is warm, dry and intact. No rash  noted. Psychiatric: Mood and affect are normal. Speech and behavior are normal.  ____________________________________________   LABS (all labs ordered are listed, but only abnormal results are displayed)  Labs Reviewed  BASIC METABOLIC PANEL - Abnormal; Notable for the following:       Result Value   Glucose, Bld 151 (*)    All other components within normal limits  BASIC METABOLIC PANEL - Abnormal; Notable for the following:    Glucose, Bld 167 (*)    All other components within normal limits  LIPID PANEL - Abnormal; Notable for the following:    Cholesterol 203 (*)    LDL Cholesterol 119 (*)    All other components within normal limits  CBC - Abnormal; Notable for the following:    Hemoglobin 11.5 (*)    All other components within normal limits  GLUCOSE, CAPILLARY - Abnormal; Notable for the following:    Glucose-Capillary 228 (*)    All other components within normal limits  GLUCOSE, CAPILLARY - Abnormal; Notable for the following:    Glucose-Capillary 146 (*)    All other components within normal limits  CBC  TROPONIN I  TROPONIN I  TSH  HEMOGLOBIN A1C  TROPONIN I  I-STAT TROPOININ, ED   ____________________________________________  EKG   EKG Interpretation  Date/Time:  Sunday December 23 2015 16:56:16 EDT Ventricular Rate:  68 PR Interval:    QRS Duration: 78 QT Interval:  386 QTC Calculation: 411 R Axis:   95 Text Interpretation:  Sinus rhythm Probable left atrial enlargement Right axis deviation Anteroseptal infarct, age indeterminate Borderline repolarization abnormality No STEMI. SImilar to prior.  Confirmed by LONG MD, JOSHUA [phone]) on 12/23/2015 5:31:07 PM       ____________________________________________  RADIOLOGY  Dg Chest 2 View  Result Date: 12/23/2015 CLINICAL DATA:  Chest pain. EXAM: CHEST  2 VIEW COMPARISON:  CT scan of October 15, 2009. FINDINGS: The heart size and mediastinal contours are within normal limits. Both lungs are clear. No  pneumothorax or pleural effusion is noted. The visualized skeletal structures are unremarkable. IMPRESSION: No active cardiopulmonary disease. Electronically Signed   By: Marijo Conception, M.D.   On: 12/23/2015 18:14    ____________________________________________   PROCEDURES  Procedure(s) performed:   Procedures  None ____________________________________________   INITIAL IMPRESSION / ASSESSMENT AND PLAN / ED COURSE  Pertinent labs & imaging results that were available during my care of the patient were reviewed by me and considered in my medical decision making (see chart for details).  Patient resents to the emergency department for evaluation of chest discomfort and jaw pain. She has multiple risk factors for acute coronary syndrome including age, hypertension, diabetes. Last cardiac testing was stress test approximately 25 years prior. Patient is chest pain-free at this time and received aspirin prior to arrival in the emergency department. Initial EKG is not concerning. Following labs and chest x-ray.   06:43 PM Patient with no chest pain. Labs and EKG reviewed and unremarkable. Chest x-ray unremarkable. A long discussion with her regarding chest pain and her risk. Will admit for chest pain observation.   06:56 PM Spoke with family medicine team and placed temporary orders. ____________________________________________  FINAL CLINICAL  IMPRESSION(S) / ED DIAGNOSES  Final diagnoses:  Nonspecific chest pain     MEDICATIONS GIVEN DURING THIS VISIT:  Medications  amLODipine (NORVASC) tablet 10 mg (10 mg Oral Given 12/24/15 0825)  aspirin chewable tablet 324 mg (324 mg Oral Not Given 12/23/15 2115)    Or  aspirin suppository 300 mg ( Rectal See Alternative 12/23/15 2115)  aspirin EC tablet 81 mg (81 mg Oral Given 12/24/15 0825)  nitroGLYCERIN (NITROSTAT) SL tablet 0.4 mg (not administered)  acetaminophen (TYLENOL) tablet 650 mg (not administered)  ondansetron (ZOFRAN)  injection 4 mg (not administered)  enoxaparin (LOVENOX) injection 40 mg (40 mg Subcutaneous Not Given 12/23/15 2200)  atorvastatin (LIPITOR) tablet 40 mg (not administered)  sodium chloride flush (NS) 0.9 % injection 3 mL (3 mLs Intravenous Given 12/24/15 0825)  sodium chloride flush (NS) 0.9 % injection 3 mL (not administered)  0.9 %  sodium chloride infusion (not administered)  insulin aspart (novoLOG) injection 0-9 Units (0 Units Subcutaneous Not Given 12/24/15 0800)  ferrous sulfate tablet 325 mg (not administered)     Note:  This document was prepared using Dragon voice recognition software and may include unintentional dictation errors.  Nanda Quinton, MD Emergency Medicine   Margette Fast, MD 12/24/15 [phone]

## 2015-12-23 NOTE — ED Notes (Signed)
Iv  Ns  tko    20  Angio    l hand  1  Att    Placed  On  Cardiac  Monitor  Nasal  o2  At  2  l  /  Min

## 2015-12-23 NOTE — ED Provider Notes (Signed)
[facility]    CSN: [id number] Arrival date & time: 12/23/15  1445     History   Chief Complaint Chief Complaint  Patient presents with  . Chest Pain    HPI Elizabeth Mcfarland is a 75 y.o. female.   The history is provided by the patient.  Chest Pain  Pain quality: tightness   Pain radiates to:  Neck Pain severity:  Moderate Duration:  1 hour Chronicity:  New Context: at rest   Context comment:  At church today with onset. Relieved by:  Aspirin Worsened by:  Nothing Associated symptoms: no abdominal pain, no cough, no diaphoresis, no dizziness, no fever, no heartburn, no nausea, no near-syncope, no palpitations, no PND, no shortness of breath and no weakness   Risk factors: diabetes mellitus and hypertension     Past Medical History:  Diagnosis Date  . Adenomatous colon polyp   . Anemia   . Diabetes mellitus without complication   . Esophageal ulcer with bleeding 05/05/2013   05/05/2013 EGD linear distal esophageal ulcer - cause of hematemesis prior to EGD   . Hypertension     Patient Active Problem List   Diagnosis Date Noted  . Anemia 06/21/2013  . Esophageal ulcer with bleeding 05/05/2013  . Personal history of colonic polyps 05/04/2013  . Type II diabetes mellitus with manifestations, uncontrolled ([place]) 02/21/2008  . HYPERTENSION 02/21/2008  . IBS 02/21/2008  . NEPHROLITHIASIS, HX OF 02/21/2008    Past Surgical History:  Procedure Laterality Date  . ABDOMINAL HYSTERECTOMY    . COLONOSCOPY, ESOPHAGOGASTRODUODENOSCOPY (EGD) AND ESOPHAGEAL DILATION N/A 05/05/2013   Procedure: COLONOSCOPY, ESOPHAGOGASTRODUODENOSCOPY (EGD) AND ESOPHAGEAL DILATION (ED);  Surgeon: Gatha Mayer, MD;  Location: [facility];  Service: Endoscopy;  Laterality: N/A;  . LITHOTRIPSY      OB History    No data available       Home Medications    Prior to Admission medications   Medication Sig Start Date End Date Taking? Authorizing Provider  aspirin 81 MG chewable  tablet Chew by mouth daily.   Yes Historical Provider, MD  amLODipine (NORVASC) 10 MG tablet Take 10 mg by mouth every morning.     Historical Provider, MD  cholecalciferol (VITAMIN D) 1000 UNITS tablet Take 1,000 Units by mouth every morning.     Historical Provider, MD  glimepiride (AMARYL) 4 MG tablet Take 4 mg by mouth 2 (two) times daily.    Historical Provider, MD  lansoprazole (PREVACID) 30 MG capsule Take 30 mg by mouth daily before breakfast.    Historical Provider, MD  polyethylene glycol powder (GLYCOLAX/MIRALAX) powder  05/03/13   Historical Provider, MD  sitaGLIPtin-metformin (JANUMET) 50-1000 MG per tablet Take 1 tablet by mouth 2 (two) times daily with a meal.    Historical Provider, MD  vitamin C (ASCORBIC ACID) 500 MG tablet Take 500 mg by mouth every morning.     Historical Provider, MD    Family History Family History  Problem Relation Age of Onset  . Diabetes Sister     Social History Social History  Substance Use Topics  . Smoking status: Never Smoker  . Smokeless tobacco: Never Used  . Alcohol use No     Allergies   Codeine and Percocet [oxycodone-acetaminophen]   Review of Systems Review of Systems  Constitutional: Negative for diaphoresis and fever.  Respiratory: Positive for chest tightness. Negative for cough and shortness of breath.   Cardiovascular: Positive for chest pain. Negative for palpitations, leg swelling, PND and  near-syncope.  Gastrointestinal: Negative for abdominal pain, heartburn and nausea.  Neurological: Negative for dizziness and weakness.     Physical Exam Triage Vital Signs ED Triage Vitals [12/23/15 1514]  Enc Vitals Group     BP 186/91     Pulse Rate 74     Resp 20     Temp 99.5 F (37.5 C)     Temp Source Oral     SpO2 99 %     Weight      Height      Head Circumference      Peak Flow      Pain Score      Pain Loc      Pain Edu?      Excl. in [place]?    No data found.   Updated Vital Signs BP 186/91 (BP  Location: Left Arm)   Pulse 74   Temp 99.5 F (37.5 C) (Oral)   Resp 20   SpO2 99%   Visual Acuity Right Eye Distance:   Left Eye Distance:   Bilateral Distance:    Right Eye Near:   Left Eye Near:    Bilateral Near:     Physical Exam  Constitutional: She is oriented to person, place, and time. She appears well-developed and well-nourished. No distress.  Neck: Normal range of motion. Neck supple.  Cardiovascular: Normal rate, regular rhythm, normal heart sounds and intact distal pulses.   Pulmonary/Chest: Effort normal and breath sounds normal.  Abdominal: Soft. Bowel sounds are normal.  Musculoskeletal: She exhibits no edema.  Lymphadenopathy:    She has no cervical adenopathy.  Neurological: She is alert and oriented to person, place, and time.  Skin: Skin is warm and dry. She is not diaphoretic.  Nursing note and vitals reviewed.    UC Treatments / Results  Labs (all labs ordered are listed, but only abnormal results are displayed) Labs Reviewed - No data to display  EKG ED ECG REPORT   Date: 12/23/2015  Rate: 70  Rhythm: normal sinus rhythm  QRS Axis: normal  Intervals: normal  ST/T Wave abnormalities: normal and nonspecific T wave changes  Conduction Disutrbances:none  Narrative Interpretation:   Old EKG Reviewed: none available  I have personally reviewed the EKG tracing and agree with the computerized printout as noted.   Radiology No results found.  Procedures Procedures (including critical care time)  Medications Ordered in UC Medications - No data to display   Initial Impression / Assessment and Plan / UC Course  I have reviewed the triage vital signs and the nursing notes.  Pertinent labs & imaging results that were available during my care of the patient were reviewed by me and considered in my medical decision making (see chart for details).  Clinical Course    Sent for chest pain eval, resolved presently with nl ecg, but worrisome  sx.  Final Clinical Impressions(s) / UC Diagnoses   Final diagnoses:  None    New Prescriptions New Prescriptions   No medications on file     Billy Fischer, MD 12/23/15 1547

## 2015-12-23 NOTE — ED Triage Notes (Signed)
Had      Chest  Pain  Into neck  Today  At  145  Pm         Which  Went  Away         She  Has  A  History  Of  htn      She  Reports    The  Pain   Is  Gone  Now    She  Is  Sitting  Upright   On the  Exam table  Appearing in no  Acute  Distress       Her  Skin is  wam  And  Dry

## 2015-12-23 NOTE — ED Notes (Signed)
Attempted to call report

## 2015-12-24 ENCOUNTER — Other Ambulatory Visit: Payer: Self-pay | Admitting: Student

## 2015-12-24 ENCOUNTER — Encounter (HOSPITAL_COMMUNITY): Payer: Self-pay | Admitting: Student

## 2015-12-24 ENCOUNTER — Other Ambulatory Visit: Payer: Self-pay | Admitting: Family Medicine

## 2015-12-24 DIAGNOSIS — I10 Essential (primary) hypertension: Secondary | ICD-10-CM | POA: Diagnosis not present

## 2015-12-24 DIAGNOSIS — R079 Chest pain, unspecified: Secondary | ICD-10-CM

## 2015-12-24 DIAGNOSIS — E119 Type 2 diabetes mellitus without complications: Secondary | ICD-10-CM | POA: Diagnosis not present

## 2015-12-24 DIAGNOSIS — R9431 Abnormal electrocardiogram [ECG] [EKG]: Secondary | ICD-10-CM

## 2015-12-24 LAB — LIPID PANEL
CHOL/HDL RATIO: 2.8 ratio
CHOLESTEROL: 203 mg/dL — AB (ref 0–200)
HDL: 72 mg/dL (ref >40)
LDL Cholesterol: 119 mg/dL — ABNORMAL HIGH (ref 0–99)
TRIGLYCERIDES: 61 mg/dL (ref <150)
VLDL: 12 mg/dL (ref 0–40)

## 2015-12-24 LAB — GLUCOSE, CAPILLARY
GLUCOSE-CAPILLARY: 146 mg/dL — AB (ref 65–99)
Glucose-Capillary: 144 mg/dL — ABNORMAL HIGH (ref 65–99)

## 2015-12-24 LAB — CBC
HCT: 36.5 % (ref 36.0–46.0)
HEMOGLOBIN: 11.5 g/dL — AB (ref 12.0–15.0)
MCH: 29 pg (ref 26.0–34.0)
MCHC: 31.5 g/dL (ref 30.0–36.0)
MCV: 92.2 fL (ref 78.0–100.0)
PLATELETS: 280 10*3/uL (ref 150–400)
RBC: 3.96 MIL/uL (ref 3.87–5.11)
RDW: 12.6 % (ref 11.5–15.5)
WBC: 7.3 10*3/uL (ref 4.0–10.5)

## 2015-12-24 LAB — BASIC METABOLIC PANEL
ANION GAP: 5 (ref 5–15)
BUN: 12 mg/dL (ref 6–20)
CHLORIDE: 107 mmol/L (ref 101–111)
CO2: 29 mmol/L (ref 22–32)
Calcium: 9.1 mg/dL (ref 8.9–10.3)
Creatinine, Ser: 0.81 mg/dL (ref 0.44–1.00)
GFR calc Af Amer: >60 mL/min (ref >60)
GFR calc non Af Amer: >60 mL/min (ref >60)
GLUCOSE: 167 mg/dL — AB (ref 65–99)
POTASSIUM: 3.8 mmol/L (ref 3.5–5.1)
Sodium: 141 mmol/L (ref 135–145)

## 2015-12-24 LAB — TROPONIN I
Troponin I: <0.03 ng/mL (ref <0.03)
Troponin I: <0.03 ng/mL (ref <0.03)

## 2015-12-24 MED ORDER — ATORVASTATIN CALCIUM 40 MG PO TABS: 40.0000 mg | ORAL_TABLET | Freq: Every day | ORAL | 0 refills | Status: DC

## 2015-12-24 MED ORDER — FERROUS SULFATE 325 (65 FE) MG PO TABS
325.0000 mg | ORAL_TABLET | Freq: Every day | ORAL | Status: DC
  Administered 2015-12-24: 325 mg via ORAL
  Filled 2015-12-24: qty 1

## 2015-12-24 NOTE — Progress Notes (Signed)
Family Medicine Teaching Service Daily Progress Note Intern Pager: [phone]  Patient name: Elizabeth Mcfarland Medical record number: [id number] Date of birth: April 03, 1940 Age: 75 y.o. Gender: female  Primary Care Provider: Foye Spurling, MD Consultants: Cardiology Code Status: Full  Pt Overview and Major Events to Date:  12/23/15: troponin's trended- negative 12/24/15: cardiology to evaluate patient  Assessment and Plan: Elizabeth Mcfarland is a 75 y.o. female presenting with chest pain. PMH is significant for hypertension, diabetes, anemia.  ACS R/O: Differentials include stable angina vs unstable angina vs atypical chest pain vs GERD. Asymptomatic at this time with normal physical exam. EKG showed no acute ST changes. Chest x-ray unremarkable. Hemoglobin 11.5 down from 12.4. No significant cardiac history although is at risk with diabetes and hypertension. Last cardiac testing was 25 years ago per patient. - Troponins negative -Vital signs per floor protocol -Continuous cardiac monitoring -Cardiology consulted, appreciate assistance  - repeat EKG this AM - ASA - Add lipitor 40mg : cholesterol in the 200s - Will hold off on beta blocker as patient was slightly bradycardic with heart rate into the 50s and 60's. -Nitroglycerin when necessary for chest pain  Hypertension: 110/52 this morning. Home medications include amlodipine 10 mg daily -Resume home medications -Monitor blood pressure  Diabetes mellitus: glucose slightly elevated this AM to 146 , patient refused SSI. No hemoglobin A1c on file. Home medications include Amaryl 2 mg daily at bedtime and Janumet 50-500 milligrams daily with breakfast -Hold home medications while inpatient -Sensitive sliding scale insulin next, can increase to moderate if glucose uncontrolled -CBGs 4 times daily before meals and at bedtime -Hemoglobin A1c pending  History of Anemia: Hemoglobin down to 11.5 from 12.4. Takes iron daily - Restart home  iron  FEN/GI: Heart healthy carb modified diet PPx: Lovenox  Disposition: Telemetry  Subjective:  Patient is pleasant this morning and laying in bed. She says she slept well last night and she is ready to go home today. She has not had any symptoms since the one event of chest pain yesterday. She denies any chest pain, shortness of breath, jaw pain, palpitations, diaphoresis, chills, abdominal pain, and lower extremity edema.   Objective: Temp:  [97.6 F (36.4 C)-99.5 F (37.5 C)] 98.2 F (36.8 C) (10/02 0452) Pulse Rate:  [56-74] 65 (10/02 0452) Resp:  [13-20] 17 (10/02 0452) BP: (110-186)/(52-91) 110/52 (10/02 0452) SpO2:  [94 %-100 %] 98 % (10/02 0452) Weight:  [136 lb 14.4 oz (62.1 kg)-137 lb 12.8 oz (62.5 kg)] 136 lb 14.4 oz (62.1 kg) (10/02 0452) Physical Exam: General: pleasant, laying in bed, no apparent distress Cardiovascular: RRR, S1S2 normal, no m/r/g Respiratory: CTA B/L Abdomen: soft, no tenderness to palpation, bowel sounds heard in all 4 quadrants Extremities: no clubbing, cyanosis, or edema  Laboratory:  Recent Labs Lab 12/23/15 1710 12/24/15 0306  WBC 6.9 7.3  HGB 12.4 11.5*  HCT 38.5 36.5  PLT 302 280    Recent Labs Lab 12/23/15 1710 12/24/15 0306  NA 138 141  K 4.1 3.8  CL 103 107  CO2 26 29  BUN 11 12  CREATININE 0.73 0.81  CALCIUM 9.8 9.1  GLUCOSE 151* 167*    Imaging/Diagnostic Tests: Dg Chest 2 View  Result Date: 12/23/2015 CLINICAL DATA:  Chest pain. EXAM: CHEST  2 VIEW COMPARISON:  CT scan of October 15, 2009. FINDINGS: The heart size and mediastinal contours are within normal limits. Both lungs are clear. No pneumothorax or pleural effusion is noted. The visualized skeletal structures are unremarkable. IMPRESSION:  No active cardiopulmonary disease. Electronically Signed   By: Marijo Conception, M.D.   On: 12/23/2015 18:14    Farrel Conners, Medical Student 12/24/2015, 9:10 AM [place] Intern pager: [phone],  text pages welcome  I have separately seen and examined the patient. I have discussed the findings and exam with student doctor Farrel Conners and agree with the above note.  My changes/additions are outlined in BLUE.   Smitty Cords, MD [place], PGY-2

## 2015-12-24 NOTE — Discharge Summary (Signed)
Elizabeth Mcfarland  Patient name: Elizabeth Mcfarland Medical record number: [id number] Date of birth: October 24, 1940 Age: 75 y.o. Gender: female Date of Admission: 12/23/2015  Date of Discharge: 12/24/2015 Admitting Physician: Kinnie Feil, MD  Primary Care Provider: Foye Spurling, MD Consultants: Cardiology  Indication for Hospitalization: Chest Pain  Discharge Diagnoses/Problem List:  Chest Pain with Mixed Typical and Atypical Features Hypertension Type 2 Diabetes Mellitus History of Anemia  Disposition: home  Discharge Condition: stable  Discharge Exam:  Blood pressure (!) 142/74, pulse 92, temperature 98.6 F (37 C), temperature source Oral, resp. rate 17, height 5\' 6"  (1.676 m), weight 136 lb 14.4 oz (62.1 kg), SpO2 98 %. General: Pleasant elderly African American female in no acute distress. Normocephalic, atraumatic. Neck: Supple, no JVD  Cardiovascular: RRR, no m/r/g, no lower extremity edema Respiratory: Normal work of breathing, CTA bilaterally Gastrointestinal: Soft, nondistended, nontender, normal bowel sounds MSK: Normal range of motion Derm: No rash Neuro: Alert and oriented 3 Psych: Normal mood and affect  Brief Hospital Course:  Elizabeth Mcfarland a 75 y.o.femalepresenting with chest pain. PMH is significant for hypertension, diabetes, and anemia. Please see H&P for full details.  Patient transported to [facility] after she started having substernal chest pain in church on 12/23/15.  EMS noted her blood pressure was noted to be 190/72.  She was given a full dose of aspirin at church and nitroglycerin on her way to the ED.  In the ED, she was completely asymptomatic and denied any chest pain or jaw discomfort.  EKG showed no significant changes compared to previous, no evidence of acute ischemia appreciated. Chest x-ray was unremarkable. Troponins were trended and were negative.  She was evaluated by cardiology, who  recommended outpatient echocardiogram and stress testing.  Patient was monitored on telemetry overnight.  Her blood pressure improved on home medication.  She remained asymptomatic during hospitalization.  Discharge instructions were reviewed with patient and she was discharged on 10/2/17with PCP and cardiology f/u.  Issues for Follow Up:  1. Follow up with PCP within one week 2. Follow up with Cardiology 10/13 for Echo and Nuclear Stress test 3. Hemoglobin A1c pending at time of discharge 4. Monitor blood pressures 5. Lifestyle modifications to help lower cholesterol/LDL- started on Lipitor during hospitalization 6. Consider adding low dose betablocker, given h/o DM.  Significant Procedures:  Significant Labs and Imaging:   Recent Labs Lab 12/23/15 1710 12/24/15 0306  WBC 6.9 7.3  HGB 12.4 11.5*  HCT 38.5 36.5  PLT 302 280    Recent Labs Lab 12/23/15 1710 12/24/15 0306  NA 138 141  K 4.1 3.8  CL 103 107  CO2 26 29  GLUCOSE 151* 167*  BUN 11 12  CREATININE 0.73 0.81  CALCIUM 9.8 9.1   Dg Chest 2 View  Result Date: 12/23/2015 CLINICAL DATA:  Chest pain. EXAM: CHEST  2 VIEW COMPARISON:  CT scan of October 15, 2009. FINDINGS: The heart size and mediastinal contours are within normal limits. Both lungs are clear. No pneumothorax or pleural effusion is noted. The visualized skeletal structures are unremarkable. IMPRESSION: No active cardiopulmonary disease. Electronically Signed   By: Marijo Conception, M.D.   On: 12/23/2015 18:14    Results/Tests Pending at Time of Discharge: Hemoglobin A1c  Discharge Medications:    Medication List    TAKE these medications   acetaminophen 500 MG tablet Commonly known as:  TYLENOL Take 1,000 mg by mouth daily as needed for headache (  pain).   amLODipine 10 MG tablet Commonly known as:  NORVASC Take 10 mg by mouth daily.   aspirin EC 81 MG tablet Take 81 mg by mouth daily.   atorvastatin 40 MG tablet Commonly known as:   LIPITOR Take 1 tablet (40 mg total) by mouth daily at 6 PM.   cholecalciferol 1000 units tablet Commonly known as:  VITAMIN D Take 1,000 Units by mouth daily.   ferrous sulfate 325 (65 FE) MG tablet Take 325 mg by mouth daily.   glimepiride 4 MG tablet Commonly known as:  AMARYL Take 2 mg by mouth at bedtime.   lansoprazole 15 MG capsule Commonly known as:  PREVACID Take 15 mg by mouth daily at 12 noon.   multivitamin with minerals Tabs tablet Take 1 tablet by mouth daily. Centrum Silver   polyethylene glycol packet Commonly known as:  MIRALAX / GLYCOLAX Take 17 g by mouth daily. Mix in 8 oz liquid and drink   sitaGLIPtin-metformin 50-500 MG tablet Commonly known as:  JANUMET Take 1 tablet by mouth daily with breakfast.   SYSTANE OP Place 1 drop into both eyes daily as needed (dry eyes).   vitamin C 1000 MG tablet Take 1,000 mg by mouth daily.       Discharge Instructions: Please refer to Patient Instructions section of EMR for full details.  Patient was counseled important signs and symptoms that should prompt return to medical care, changes in medications, dietary instructions, activity restrictions, and follow up appointments.   Follow-Up Appointments: Follow-up Information    [place] Office Follow up on 01/04/2016.   Specialty:  Cardiology Why:  Cardiology Appointment on 01/04/2016 at 7:30AM for Echocardiogram and Nuclear Stress Test at 8:45AM.  Contact information: [address], [address] [place] [phone]       Foye Spurling, MD. Schedule an appointment as soon as possible for a visit in 1 week(s).   Specialty:  Internal Medicine Why:  Hospital follow up Contact information: [address] [place] [address] [phone]           Farrel Conners, Medical Student 12/24/2015, 2:07 PM [place]  I have separately seen and examined the patient. I have  discussed the findings and exam with Student Dr Erven Colla and agree with the above note.  My changes/additions are outlined in BLUE.   Majed Pellegrin M. Lajuana Ripple, DO PGY-3, [facility]

## 2015-12-24 NOTE — Consult Note (Signed)
Cardiology Consult    Patient ID: Elizabeth Mcfarland MRN: [id number], DOB/AGE: [birth year]   Admit date: 12/23/2015 Date of Consult: 12/24/2015  Primary Physician: Foye Spurling, MD Reason for Consult: Chest Pain Primary Cardiologist: New Requesting Provider: Dr. Gwendlyn Deutscher   History of Present Illness    Elizabeth Mcfarland is a 75 y.o. female with past medical history of HTN and Type 2 DM who was transferred to [facility] on 12/23/2015 after being seen at Urgent Care for evaluation of chest pain.  Patient reports she was at church yesterday morning when she developed sudden onset chest pressure and left-sided jaw pain. She had forgot to take her morning BP medications and SBP was elevated to 190 when EMS arrived. She denies any associated dyspnea, nausea, vomiting, or diaphoresis. Pain lasted for approximately 20 minutes then resolved spontaneously. No repeat episodes since being admitted.  She denies any previous episodes of chest discomfort or dyspnea with exertion. She is active at baseline performing household chores independently, doing landscaping, and doing garden work.   No prior cardiac history. She had a stress test 20+ years ago which she says is "normal". No prior cardiac catheterizations. No prior alcohol or tobacco use. Family history significant for CAD in maternal grandmother.  While admitted, labs have shown a WBC of 6.9, Hgb 12.4, and platelets 302. Initial two troponin values have been negative. Lipid Panel with total cholesterol 203, HDL 72, and LDL 119. CXR with no active cardiopulmonary disease. EKG shows NSR, HR 68, with TWI in leads III and V1 (similar to previous tracings).   Past Medical History   Past Medical History:  Diagnosis Date  . Adenomatous colon polyp   . Anemia   . Diabetes mellitus without complication ([place])   . Esophageal ulcer with bleeding 05/05/2013   05/05/2013 EGD linear distal esophageal ulcer - cause of hematemesis prior to EGD   . Hypertension       Past Surgical History:  Procedure Laterality Date  . ABDOMINAL HYSTERECTOMY    . COLONOSCOPY, ESOPHAGOGASTRODUODENOSCOPY (EGD) AND ESOPHAGEAL DILATION N/A 05/05/2013   Procedure: COLONOSCOPY, ESOPHAGOGASTRODUODENOSCOPY (EGD) AND ESOPHAGEAL DILATION (ED);  Surgeon: Gatha Mayer, MD;  Location: [facility] ENDOSCOPY;  Service: Endoscopy;  Laterality: N/A;  . LITHOTRIPSY       Allergies  Allergies  Allergen Reactions  . Codeine Nausea And Vomiting  . Percocet [Oxycodone-Acetaminophen] Nausea And Vomiting    Inpatient Medications    . amLODipine  10 mg Oral Daily  . aspirin  324 mg Oral NOW   Or  . aspirin  300 mg Rectal NOW  . aspirin EC  81 mg Oral Daily  . atorvastatin  40 mg Oral q1800  . enoxaparin (LOVENOX) injection  40 mg Subcutaneous Q24H  . insulin aspart  0-9 Units Subcutaneous TID WC  . sodium chloride flush  3 mL Intravenous Q12H    Family History    Family History  Problem Relation Age of Onset  . Diabetes Sister   . CAD Maternal Grandmother     Social History    Social History   Social History  . Marital status: Single    Spouse name: N/A  . Number of children: 1  . Years of education: N/A   Occupational History  . Caregiver Self-Employed   Social History Main Topics  . Smoking status: Never Smoker  . Smokeless tobacco: Never Used  . Alcohol use No  . Drug use: No  . Sexual activity: Not on file   Other  Topics Concern  . Not on file   Social History Narrative  . No narrative on file    Additional social history is notable in that she was born in [place].  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  There is no tobacco history.  Review of Systems    General:  No chills, fever, night sweats or weight changes.  Cardiovascular:  No dyspnea on exertion, edema, orthopnea, palpitations, paroxysmal nocturnal dyspnea. Positive for chest pain and left-sided  jaw pain.  Dermatological: No rash, lesions/masses Respiratory: No cough, dyspnea Urologic: No hematuria, dysuria Abdominal:   No nausea, vomiting, diarrhea, bright red blood per rectum, melena, or hematemesis Neurologic:  No visual changes, wkns, changes in mental status. All other systems reviewed and are otherwise negative except as noted above.  Physical Exam    Blood pressure (!) 110/52, pulse 65, temperature 98.2 F (36.8 C), resp. rate 17, height 5\' 6"  (1.676 m), weight 136 lb 14.4 oz (62.1 kg), SpO2 98 %.  General: Pleasant, African American female appearing in NAD. Psych: Normal affect. Neuro: Alert and oriented X 3. Moves all extremities spontaneously. HEENT: Normal  Neck: Supple without bruits or JVD. Lungs:  Resp regular and unlabored, CTA without wheezing or rales. Heart: RRR no s3, s4, or murmurs. Abdomen: Soft, non-tender, non-distended, BS + x 4.  Extremities: No clubbing, cyanosis or edema. DP/PT/Radials 2+ and equal bilaterally.  Labs    Troponin [facility] of Care Test)  Recent Labs  12/23/15 1738  TROPIPOC 0.00    Recent Labs  12/23/15 2123 12/24/15 0306  TROPONINI <0.03 <0.03   Lab Results  Component Value Date   WBC 7.3 12/24/2015   HGB 11.5 (L) 12/24/2015   HCT 36.5 12/24/2015   MCV 92.2 12/24/2015   PLT 280 12/24/2015     Recent Labs Lab 12/24/15 0306  NA 141  K 3.8  CL 107  CO2 29  BUN 12  CREATININE 0.81  CALCIUM 9.1  GLUCOSE 167*   Lab Results  Component Value Date   CHOL 203 (H) 12/24/2015   HDL 72 12/24/2015   LDLCALC 119 (H) 12/24/2015   TRIG 61 12/24/2015   No results found for: [facility]   Radiology Studies    Dg Chest 2 View  Result Date: 12/23/2015 CLINICAL DATA:  Chest pain. EXAM: CHEST  2 VIEW COMPARISON:  CT scan of October 15, 2009. FINDINGS: The heart size and mediastinal contours are within normal limits. Both lungs are clear. No pneumothorax or pleural effusion is noted. The visualized skeletal structures are  unremarkable. IMPRESSION: No active cardiopulmonary disease. Electronically Signed   By: Marijo Conception, M.D.   On: 12/23/2015 18:14    EKG & Cardiac Imaging    EKG: NSR, HR 68, with TWI in leads III and V1 (similar to previous tracings)  Echocardiogram: None on File.  Assessment & Plan    1. Chest Pain with Mixed Typical and Atypical Features - presented with chest pain which occurred in the setting of elevated HTN. Associated left-sided jaw pain with no dyspnea, diaphoresis, nausea, or vomiting. No repeat episodes since being admitted.  - active at baseline with no recent anginal symptoms. Does have cardiac risk factors including HTN, Type 2 DM, and family history of CAD.  - initial two troponin values have been negative. EKG shows NSR, HR 68, with TWI in leads III and V1 (similar to  previous tracings).  - with presenting symptoms, cardiac risk factors, and negative troponin values would recommend nuclear stress testing. Could perform as outpatient vs. inpatient. Will discuss with MD.   2. HTN - SBP elevated to 190's upon admission. Patient had forgotten to take her morning medications. - Improved this AM. Continue Amlodipine.   3. Type 2 DM - A1c pending. On oral anti-glycemic's as an outpatient.    Signed, Erma Heritage, PA-C 12/24/2015, 9:12 AM Pager: [phone]  Patient seen and examined. Agree with assessment and plan.  Elizabeth Mcfarland is a 75 year old African-American female who has a greater than 30 year history of hypertension and type 2 diabetes mellitus.  She had been maintained on amlodipine 10 mg daily.  She denies any exertionally precipitated chest discomfort.  Yesterday she got up early, had gone to church when she went back to a later service developed chest tightness while sitting down.  Her pain was not pleuritic.  There is remote history of an esophageal ulcer for which she takes over-the-counter Prevacid.  Review of her records reveals an ECG from 2014,  which has shown previous T-wave abnormalities anterolaterally.  When she was initially evaluated yesterday she was hypertensive up to a blood pressure [id number] systolic which apparently normalized fairly quickly.  On exam.  Presently her blood pressure is in the 0000000 systolically, although earlier today was 110/52.  HEENT is unremarkable, although she has upper dentures.  There is no JVD.  I did not hear carotid bruits.  Her lungs were clear.  She did not have chest wall tenderness to palpation.  Rhythm was regular with no S3 or S4 gallop.  There was no rub.  Soft, nontender.  Pulses were 2+.  There was no edema.  Neurologic exam is grossly nonfocal.  Troponins are negative.  She was found to have mild hyperlipidemia and atorvastatin 40 mg was initiated.  Chest x-ray did not reveal any active cardiopulmonary disease.  Her ECG dependently reviewed by me reveals normal sinus rhythm at 68 bpm.  There is poor progression anteriorly.  There are nonspecific ST-T changes which actually are improved from her 2014 tracing.  Intervals are normal.  I suspect her chest pain is nonischemic in etiology.  With her long-standing hypertension history,  I suspect she may have a component of LVH with possible repolarization abnormalities.  Her blood pressure was fairly labile and I would anticipate that if she had stable blood pressure this should not have been so elevated with missing only 1 dose of amlodipine.  With her diabetes mellitus, she may benefit from the addition of low-dose carvedilol for beta blocker therapy.  From a cardiac perspective, feel she is stable for hospital discharge today.  However, I would plan that she undergo an outpatient 2-D echo, post study to evaluate both systolic and diastolic function, LVH and valvular abnormalities.  I will also recommend a nuclear stress test for noninvasive ischemic evaluation.   Troy Sine, MD, [facility] 12/24/2015 10:16 AM

## 2015-12-24 NOTE — Care Management Obs Status (Signed)
[place] NOTIFICATION   Patient Details  Name: Elizabeth Mcfarland MRN: [id number] Date of Birth: Nov 26, 1940   Medicare Observation Status Notification Given:  Yes    Bethena Roys, RN 12/24/2015, 11:02 AM

## 2015-12-24 NOTE — Discharge Instructions (Signed)
You have a Stress Test scheduled at [facility]. Your doctor has ordered this test to get a better idea of how your heart works. ° °Please arrive 15 minutes early for paperwork.  ° °Location: °[address], [address] °[place], [place] [address] °[phone] ° °Instructions: °· No food/drink after midnight the night before.  °· No caffeine/decaf products 24 hours before, including medicines such as Excedrin or Goody Powders. Call if there are any questions.  °· Wear comfortable clothes and shoes.  °· It is OK to take your morning meds with a sip of water EXCEPT for those types of medicines listed below or otherwise instructed. ° °Special Medication Instructions: °· Beta blockers such as metoprolol (Lopressor/Toprol XL), atenolol (Tenormin), carvedilol (Coreg), nebivolol (Bystolic), propranolol (Inderal) should not be taken for 24 hours before the test. °· Calcium channel blockers such as diltiazem (Cardizem) or verapmil (Calan) should not be taken for 24 hours before the test. °· Remove nitroglycerin patches and do not take nitrate preparations such as Imdur/isosorbide the day of your test. °· No Persantine/Theophylline or Aggrenox medicines should be used within 24 hours of the test.  ° °What To Expect: °The whole test will take several hours. When you arrive in the lab, the technician will inject a small amount of radioactive tracer into your arm through an IV while you are resting quietly. This helps us to form pictures of your heart. You will likely only feel a sting from the IV. After a waiting period, resting pictures will be obtained under a big camera. These are the "before" pictures. ° °Next, you will be prepped for the stress portion of the test. This may include either walking on a treadmill or receiving a medicine that helps to dilate blood vessels in your heart to simulate the effect of exercise on your heart. If you are walking on a treadmill, you will walk at different paces to  try to get your heart rate to a goal number that is based on your age. If your doctor has chosen the pharmacologic test, then you will receive a medicine through your IV that may cause temporary nausea, flushing, shortness of breath and sometimes chest discomfort or vomiting. This is typically short-lived and usually resolves quickly. Your blood pressure and heart rate will be monitored, and we will be watching your EKG on a computer screen for any changes. During this portion of the test, the radiologist will inject another small amount of radioactive tracer into your IV. After a waiting period, you will undergo a second set of pictures. These are the "after" pictures. ° °The doctor reading the test will compare the before-and-after images to look for evidence of heart blockages or heart weakness. In certain instances, this test is done over 2 days but usually only takes 1 day to complete. ° °

## 2015-12-25 LAB — HEMOGLOBIN A1C
HEMOGLOBIN A1C: 6.7 % — AB (ref 4.8–5.6)
MEAN PLASMA GLUCOSE: 146 mg/dL

## 2015-12-27 DIAGNOSIS — Z205 Contact with and (suspected) exposure to viral hepatitis: Secondary | ICD-10-CM | POA: Diagnosis not present

## 2016-01-01 ENCOUNTER — Telehealth (HOSPITAL_COMMUNITY): Payer: Self-pay | Admitting: *Deleted

## 2016-01-01 NOTE — Telephone Encounter (Signed)
Left message on voicemail in reference to upcoming appointment scheduled for 01/04/16. Phone number given for a call back so details instructions can be given.   Elizabeth Mcfarland

## 2016-01-04 ENCOUNTER — Ambulatory Visit (HOSPITAL_BASED_OUTPATIENT_CLINIC_OR_DEPARTMENT_OTHER): Payer: Medicare Other

## 2016-01-04 ENCOUNTER — Other Ambulatory Visit: Payer: Self-pay

## 2016-01-04 ENCOUNTER — Ambulatory Visit (HOSPITAL_COMMUNITY): Payer: Medicare Other | Attending: Cardiology

## 2016-01-04 DIAGNOSIS — R9431 Abnormal electrocardiogram [ECG] [EKG]: Secondary | ICD-10-CM

## 2016-01-04 DIAGNOSIS — R079 Chest pain, unspecified: Secondary | ICD-10-CM | POA: Diagnosis not present

## 2016-01-04 DIAGNOSIS — I10 Essential (primary) hypertension: Secondary | ICD-10-CM | POA: Diagnosis not present

## 2016-01-04 DIAGNOSIS — E119 Type 2 diabetes mellitus without complications: Secondary | ICD-10-CM | POA: Diagnosis not present

## 2016-01-04 DIAGNOSIS — D649 Anemia, unspecified: Secondary | ICD-10-CM | POA: Diagnosis not present

## 2016-01-04 LAB — ECHOCARDIOGRAM COMPLETE
AOASC: 31 cm
CHL CUP DOP CALC LVOT VTI: 20.2 cm
E decel time: 194 msec
EERAT: 6.59
FS: 38 % (ref 28–44)
IVS/LV PW RATIO, ED: 0.95
LA ID, A-P, ES: 35 mm
LA vol A4C: 35.5 ml
LA vol index: 26.4 mL/m2
LADIAMINDEX: 2.06 cm/m2
LAVOL: 44.9 mL
LDCA: 3.14 cm2
LEFT ATRIUM END SYS DIAM: 35 mm
LV E/e' medial: 6.59
LV TDI E'MEDIAL: 5.87
LVEEAVG: 6.59
LVELAT: 10.3 cm/s
LVOT diameter: 20 mm
LVOT peak vel: 95 cm/s
LVOTSV: 63 mL
MV Dec: 194
MV pk E vel: 67.9 m/s
MVPKAVEL: 71.3 m/s
PW: 10.3 mm — AB (ref 0.6–1.1)
RV LATERAL S' VELOCITY: 11.2 cm/s
RV sys press: 20 mmHg
Reg peak vel: 206 cm/s
TDI e' lateral: 10.3
TRMAXVEL: 206 cm/s

## 2016-01-04 LAB — MYOCARDIAL PERFUSION IMAGING
CHL CUP RESTING HR STRESS: 62 {beats}/min
CSEPPHR: 99 {beats}/min
LV dias vol: 84 mL (ref 46–106)
LVSYSVOL: 28 mL
NUC STRESS TID: 0.93
RATE: 0.24
SDS: 1
SRS: 0
SSS: 1

## 2016-01-04 MED ORDER — TECHNETIUM TC 99M TETROFOSMIN IV KIT
10.6000 | PACK | Freq: Once | INTRAVENOUS | Status: AC | PRN
  Administered 2016-01-04: 10.6 via INTRAVENOUS
  Filled 2016-01-04: qty 11

## 2016-01-04 MED ORDER — REGADENOSON 0.4 MG/5ML IV SOLN
0.4000 mg | Freq: Once | INTRAVENOUS | Status: AC
  Administered 2016-01-04: 0.4 mg via INTRAVENOUS

## 2016-01-04 MED ORDER — TECHNETIUM TC 99M TETROFOSMIN IV KIT
32.0000 | PACK | Freq: Once | INTRAVENOUS | Status: AC | PRN
  Administered 2016-01-04: 32 via INTRAVENOUS
  Filled 2016-01-04: qty 32

## 2016-01-09 ENCOUNTER — Telehealth: Payer: Self-pay | Admitting: *Deleted

## 2016-01-09 NOTE — Telephone Encounter (Signed)
Follow Up:     Returning your call, concerning her test results. 

## 2016-03-05 ENCOUNTER — Encounter: Payer: Self-pay | Admitting: Podiatry

## 2016-03-05 ENCOUNTER — Ambulatory Visit (INDEPENDENT_AMBULATORY_CARE_PROVIDER_SITE_OTHER): Payer: Medicare Other | Admitting: Podiatry

## 2016-03-05 VITALS — BP 144/83 | HR 69 | Resp 18

## 2016-03-05 DIAGNOSIS — R0989 Other specified symptoms and signs involving the circulatory and respiratory systems: Secondary | ICD-10-CM | POA: Diagnosis not present

## 2016-03-05 DIAGNOSIS — M2042 Other hammer toe(s) (acquired), left foot: Secondary | ICD-10-CM | POA: Diagnosis not present

## 2016-03-05 DIAGNOSIS — M150 Primary generalized (osteo)arthritis: Secondary | ICD-10-CM | POA: Diagnosis not present

## 2016-03-05 DIAGNOSIS — M79672 Pain in left foot: Secondary | ICD-10-CM | POA: Diagnosis not present

## 2016-03-05 DIAGNOSIS — E119 Type 2 diabetes mellitus without complications: Secondary | ICD-10-CM | POA: Diagnosis not present

## 2016-03-05 DIAGNOSIS — Q828 Other specified congenital malformations of skin: Secondary | ICD-10-CM

## 2016-03-05 DIAGNOSIS — I10 Essential (primary) hypertension: Secondary | ICD-10-CM | POA: Diagnosis not present

## 2016-03-05 DIAGNOSIS — L84 Corns and callosities: Secondary | ICD-10-CM

## 2016-03-05 NOTE — Addendum Note (Signed)
Addended by: Harriett Sine D on: 03/05/2016 12:36 PM   Modules accepted: Orders

## 2016-03-05 NOTE — Progress Notes (Signed)
   Subjective:    Patient ID: Elizabeth Mcfarland, female    DOB: March 17, 1941, 75 y.o.   MRN: [id number]  HPI    This diabetic patient presents today complaining of painful corns on the second left toe and fifth left toe for approximately 2 months. Patient is applying nonmedicated corn pads with minimal relief. She wears soft shoes or slippers to accommodate to these areas she is uncomfortable with close shoes Patient is diabetic and denies any history of claudication, amputation or foot ulceration Patient denies smoking history  Review of Systems  All other systems reviewed and are negative.      Objective:   Physical Exam  Orientated 3 No calf tenderness or calf edema bilaterally DP pulses 2/4 bilaterally PT pulses nonpalpable bilaterally Capillary reflex within normal limits bilaterally Sensation to 10 g monofilament wire intact 5/5 bilaterally Ankle reflex equal and reactive bilaterally Vibratory sensation intact bilaterally No open skin lesions bilaterally Keratoses distal medial interphalangeal joint second left toe Well-organized keratoses dorsal lateral fifth left toe Varus rotation of fifth left toe with tenderness to palpation on corn Motor testing dorsi flexion, plantar flexion, inversion, eversion 5/5 bilaterally      Assessment & Plan:   Assessment: Diabetic with absent posterior tibial pulses Small friction keratoses second left toe Varus rotated fifth left toe with associated corn  Plan: Reviewed the results of the exam with patient today informing her that I was unable to palpate posterior tibial pulses and will refer to the vascular lab for lower extremity arterial Doppler with TBI is and ABIs for the indication of diabetic with absent pedal pulses Debrided keratoses medial second left toe and dispensed gel toe Debrided keratoses fifth left toe with slight bleeding. There is a small punctate area when compressed released no purulent drainage. The toe was dressed  with a protective pad in antibiotic ointment dressing. Patient was advised to remove the dressing and 1-3 days and to continue apply topical antibiotic ointment and a Band-Aid daily until a scab forms. Instructed if she knows any sudden increase in pain, swelling, redness, fever to contact our office or present to the emergency department  Notify patient of results of lower extremity arterial Doppler

## 2016-03-05 NOTE — Patient Instructions (Signed)
Today your diabetic foot screen demonstrated adequate feeling in your feet It was difficult to feel one of the pulses (posterior tibial) in your feet. I have ordered a lower extremity arterial Doppler at of vascular lab to check the circulation into your feet Today the corn on the fifth toes trimmed and there was small amount of bleeding without release of pus. An antibiotic dressing was applied to the toe. Remove this dressing and 1-3 days and continue to apply topical antibiotic ointment and a Band-Aid daily until a scab forms. If you notice any sudden increase in pain, swelling, redness contact our office or present to ED Will notify the results of the vascular lab  Diabetes and Foot Care Diabetes may cause you to have problems because of poor blood supply (circulation) to your feet and legs. This may cause the skin on your feet to become thinner, break easier, and heal more slowly. Your skin may become dry, and the skin may peel and crack. You may also have nerve damage in your legs and feet causing decreased feeling in them. You may not notice minor injuries to your feet that could lead to infections or more serious problems. Taking care of your feet is one of the most important things you can do for yourself. Follow these instructions at home:  Wear shoes at all times, even in the house. Do not go barefoot. Bare feet are easily injured.  Check your feet daily for blisters, cuts, and redness. If you cannot see the bottom of your feet, use a mirror or ask someone for help.  Wash your feet with warm water (do not use hot water) and mild soap. Then pat your feet and the areas between your toes until they are completely dry. Do not soak your feet as this can dry your skin.  Apply a moisturizing lotion or petroleum jelly (that does not contain alcohol and is unscented) to the skin on your feet and to dry, brittle toenails. Do not apply lotion between your toes.  Trim your toenails straight across.  Do not dig under them or around the cuticle. File the edges of your nails with an emery board or nail file.  Do not cut corns or calluses or try to remove them with medicine.  Wear clean socks or stockings every day. Make sure they are not too tight. Do not wear knee-high stockings since they may decrease blood flow to your legs.  Wear shoes that fit properly and have enough cushioning. To break in new shoes, wear them for just a few hours a day. This prevents you from injuring your feet. Always look in your shoes before you put them on to be sure there are no objects inside.  Do not cross your legs. This may decrease the blood flow to your feet.  If you find a minor scrape, cut, or break in the skin on your feet, keep it and the skin around it clean and dry. These areas may be cleansed with mild soap and water. Do not cleanse the area with peroxide, alcohol, or iodine.  When you remove an adhesive bandage, be sure not to damage the skin around it.  If you have a wound, look at it several times a day to make sure it is healing.  Do not use heating pads or hot water bottles. They may burn your skin. If you have lost feeling in your feet or legs, you may not know it is happening until it is too late.  Make sure your health care provider performs a complete foot exam at least annually or more often if you have foot problems. Report any cuts, sores, or bruises to your health care provider immediately. Contact a health care provider if:  You have an injury that is not healing.  You have cuts or breaks in the skin.  You have an ingrown nail.  You notice redness on your legs or feet.  You feel burning or tingling in your legs or feet.  You have pain or cramps in your legs and feet.  Your legs or feet are numb.  Your feet always feel cold. Get help right away if:  There is increasing redness, swelling, or pain in or around a wound.  There is a red line that goes up your leg.  Pus is  coming from a wound.  You develop a fever or as directed by your health care provider.  You notice a bad smell coming from an ulcer or wound. This information is not intended to replace advice given to you by your health care provider. Make sure you discuss any questions you have with your health care provider. Document Released: 03/07/2000 Document Revised: 08/16/2015 Document Reviewed: 08/17/2012 Elsevier Interactive Patient Education  2017 [employer].

## 2016-03-20 ENCOUNTER — Other Ambulatory Visit: Payer: Self-pay | Admitting: Podiatry

## 2016-03-20 DIAGNOSIS — R0989 Other specified symptoms and signs involving the circulatory and respiratory systems: Secondary | ICD-10-CM

## 2016-04-07 ENCOUNTER — Ambulatory Visit (HOSPITAL_COMMUNITY)
Admission: RE | Admit: 2016-04-07 | Discharge: 2016-04-07 | Disposition: A | Discharge status: Home / Self Care | Payer: Medicare Other | Source: Ambulatory Visit | Attending: Cardiology | Admitting: Cardiology

## 2016-04-07 DIAGNOSIS — I771 Stricture of artery: Secondary | ICD-10-CM | POA: Insufficient documentation

## 2016-04-07 DIAGNOSIS — R0989 Other specified symptoms and signs involving the circulatory and respiratory systems: Secondary | ICD-10-CM | POA: Diagnosis not present

## 2016-04-07 DIAGNOSIS — I739 Peripheral vascular disease, unspecified: Secondary | ICD-10-CM | POA: Diagnosis not present

## 2016-04-18 ENCOUNTER — Telehealth: Payer: Self-pay | Admitting: *Deleted

## 2016-04-18 DIAGNOSIS — R0989 Other specified symptoms and signs involving the circulatory and respiratory systems: Secondary | ICD-10-CM

## 2016-04-18 NOTE — Telephone Encounter (Addendum)
-----   Message from Gean Birchwood, DPM sent at 04/15/2016  7:52 AM EST ----- Contact Dr.Arida and ask if he wants a follow-up on this arterial Doppler prior to contacting the patient   Waveforms suggest run-off disease in the left lower extremity although no clear obstructive disease is seen on Duplex.  Heterogeneous plaque in both lower extremities, without focal stenosis. Two vessel run-off in the right lower extremity, with occluded right anterior tibial artery. Three vessel run-off in the left lower extremity. 04/18/2016-Left message to call for results. 04/22/2016-I informed pt of Dr. Phoebe Perch recommendation to be evaluated by Dr. Fletcher Anon. Faxed referral, LOV and demographics to [place].

## 2016-04-28 ENCOUNTER — Other Ambulatory Visit: Payer: Self-pay | Admitting: Internal Medicine

## 2016-04-28 DIAGNOSIS — Z1231 Encounter for screening mammogram for malignant neoplasm of breast: Secondary | ICD-10-CM

## 2016-05-06 ENCOUNTER — Encounter: Payer: Self-pay | Admitting: Cardiovascular Disease

## 2016-05-06 ENCOUNTER — Ambulatory Visit (INDEPENDENT_AMBULATORY_CARE_PROVIDER_SITE_OTHER): Payer: Medicare Other | Admitting: Cardiovascular Disease

## 2016-05-06 VITALS — BP 146/78 | HR 77 | Ht 66.0 in | Wt 144.6 lb

## 2016-05-06 DIAGNOSIS — E7800 Pure hypercholesterolemia, unspecified: Secondary | ICD-10-CM

## 2016-05-06 DIAGNOSIS — I739 Peripheral vascular disease, unspecified: Secondary | ICD-10-CM | POA: Diagnosis not present

## 2016-05-06 NOTE — Progress Notes (Signed)
Cardiology Office Note   Date:  05/06/2016   ID:  Elizabeth Mcfarland, DOB 1941-03-09, MRN [id number]  PCP:  Foye Spurling, MD  Cardiologist:  new  Chief Complaint  Patient presents with  . New Evaluation    diminished pulses      History of Present Illness: Elizabeth Mcfarland is a 76 y.o. female who was referred by Dr. Amalia Hailey For evaluation and management of peripheral arterial disease. The patient has known history of type 2 diabetes, hypertension and mild hyperlipidemia. She was hospitalized in October with atypical chest pain and ruled out for myocardial infarction. She underwent a nuclear stress test and echocardiogram in both of them were unremarkable. She was seen by Dr. Amalia Hailey for a corn on her feet and was noted to have absent posterior tibial pulses. Thus, she was referred for lower arterial Doppler which showed mildly reduced ABI on the left side with possible runoff disease on the left. Duplex showed diffuse nonobstructive atherosclerosis. She denies any claudication and she has no lower extremity wounds or ulcerations. She reports that her chest pain has resolved and she denies any shortness of breath. She is not a smoker and has no family history of premature coronary artery disease.    Past Medical History:  Diagnosis Date  . Adenomatous colon polyp   . Anemia   . Diabetes mellitus without complication ([place])   . Esophageal ulcer with bleeding 05/05/2013   05/05/2013 EGD linear distal esophageal ulcer - cause of hematemesis prior to EGD   . Hypertension     Past Surgical History:  Procedure Laterality Date  . ABDOMINAL HYSTERECTOMY    . COLONOSCOPY, ESOPHAGOGASTRODUODENOSCOPY (EGD) AND ESOPHAGEAL DILATION N/A 05/05/2013   Procedure: COLONOSCOPY, ESOPHAGOGASTRODUODENOSCOPY (EGD) AND ESOPHAGEAL DILATION (ED);  Surgeon: Gatha Mayer, MD;  Location: [facility] ENDOSCOPY;  Service: Endoscopy;  Laterality: N/A;  . LITHOTRIPSY       Current Outpatient Prescriptions  Medication  Sig Dispense Refill  . acetaminophen (TYLENOL) 500 MG tablet Take 1,000 mg by mouth daily as needed for headache (pain).    Marland Kitchen amLODipine (NORVASC) 10 MG tablet Take 10 mg by mouth daily.     . Ascorbic Acid (VITAMIN C) 1000 MG tablet Take 1,000 mg by mouth daily.    Marland Kitchen aspirin EC 81 MG tablet Take 81 mg by mouth daily.    Marland Kitchen atorvastatin (LIPITOR) 40 MG tablet Take 1 tablet (40 mg total) by mouth daily at 6 PM. 30 tablet 0  . cholecalciferol (VITAMIN D) 1000 UNITS tablet Take 1,000 Units by mouth daily.     . ferrous sulfate 325 (65 FE) MG tablet Take 325 mg by mouth daily.    Marland Kitchen glimepiride (AMARYL) 4 MG tablet Take 2 mg by mouth at bedtime.     . lansoprazole (PREVACID) 15 MG capsule Take 15 mg by mouth daily at 12 noon.    . Multiple Vitamin (MULTIVITAMIN WITH MINERALS) TABS tablet Take 1 tablet by mouth daily. Centrum Silver    . Polyethyl Glycol-Propyl Glycol (SYSTANE OP) Place 1 drop into both eyes daily as needed (dry eyes).     . polyethylene glycol (MIRALAX / GLYCOLAX) packet Take 17 g by mouth daily. Mix in 8 oz liquid and drink    . sitaGLIPtin-metformin (JANUMET) 50-500 MG tablet Take 1 tablet by mouth daily with breakfast.     No current facility-administered medications for this visit.     Allergies:   Codeine and Percocet [oxycodone-acetaminophen]    Social History:  The patient  reports that she has never smoked. She has never used smokeless tobacco. She reports that she does not drink alcohol or use drugs.   Family History:  The patient's family history includes Asthma in her brother; CAD in her maternal grandmother; Diabetes in her sister; Emphysema in her brother; Heart disease in her brother; Kidney failure in her sister; Stroke in her brother and father.    ROS:  Please see the history of present illness.   Otherwise, review of systems are positive for none.   All other systems are reviewed and negative.    PHYSICAL EXAM: VS:  BP (!) 146/78 (BP Location: Left Arm,  Patient Position: Sitting, Cuff Size: Normal)   Pulse 77   Ht 5\' 6"  (1.676 m)   Wt 144 lb 9.6 oz (65.6 kg)   BMI 23.34 kg/m  , BMI Body mass index is 23.34 kg/m. GEN: Well nourished, well developed, in no acute distress  HEENT: normal  Neck: no JVD, carotid bruits, or masses Cardiac: RRR; no murmurs, rubs, or gallops,no edema  Respiratory:  clear to auscultation bilaterally, normal work of breathing GI: soft, nontender, nondistended, + BS MS: no deformity or atrophy  Skin: warm and dry, no rash Neuro:  Strength and sensation are intact Psych: euthymic mood, full affect Vascular: Femoral pulses are normal bilaterally. Dorsalis pedis is +2 bilaterally and posterior tibial is not palpable.   EKG:  EKG is not ordered today.    Recent Labs: 12/23/2015: TSH 1.577 12/24/2015: BUN 12; Creatinine, Ser 0.81; Hemoglobin 11.5; Platelets 280; Potassium 3.8; Sodium 141    Lipid Panel    Component Value Date/Time   CHOL 203 (H) 12/24/2015 0306   TRIG 61 12/24/2015 0306   HDL 72 12/24/2015 0306   CHOLHDL 2.8 12/24/2015 0306   VLDL 12 12/24/2015 0306   LDLCALC 119 (H) 12/24/2015 0306      Wt Readings from Last 3 Encounters:  05/06/16 144 lb 9.6 oz (65.6 kg)  12/24/15 136 lb 14.4 oz (62.1 kg)  06/21/13 143 lb 3.2 oz (65 kg)        PAD Screen 05/06/2016  Previous PAD dx? No  Previous surgical procedure? No  Pain with walking? No  Feet/toe relief with dangling? No  Painful, non-healing ulcers? No  Extremities discolored? No      ASSESSMENT AND PLAN:  1.  Asymptomatic peripheral arterial disease: The patient's noninvasive testing shows evidence of mild peripheral arterial disease. Currently, she has no claudication and no evidence of critical limb ischemia. Thus, I recommend continuing medical therapy. She can return as needed if she becomes symptomatic.  2. Hyperlipidemia: Most recent LDL was 119. She was discharged on atorvastatin in October when she presented with chest  pain but she has not been taking the medication. Given that she is diabetic and has mild peripheral arterial disease, I recommend a target LDL of less than 70. She wants to discuss this with her primary care physician.    Disposition:   FU with me as needed.   Signed,  Kathlyn Sacramento, MD  05/06/2016 8:44 AM    [facility]

## 2016-05-06 NOTE — Patient Instructions (Signed)

## 2016-05-20 ENCOUNTER — Ambulatory Visit
Admission: RE | Admit: 2016-05-20 | Discharge: 2016-05-20 | Disposition: A | Discharge status: Home / Self Care | Payer: Medicare Other | Source: Ambulatory Visit | Attending: Internal Medicine | Admitting: Internal Medicine

## 2016-05-20 DIAGNOSIS — Z1231 Encounter for screening mammogram for malignant neoplasm of breast: Secondary | ICD-10-CM | POA: Diagnosis not present

## 2016-06-25 DIAGNOSIS — E119 Type 2 diabetes mellitus without complications: Secondary | ICD-10-CM | POA: Diagnosis not present

## 2016-06-25 DIAGNOSIS — E1165 Type 2 diabetes mellitus with hyperglycemia: Secondary | ICD-10-CM | POA: Diagnosis not present

## 2016-06-25 DIAGNOSIS — E559 Vitamin D deficiency, unspecified: Secondary | ICD-10-CM | POA: Diagnosis not present

## 2016-06-25 DIAGNOSIS — M2550 Pain in unspecified joint: Secondary | ICD-10-CM | POA: Diagnosis not present

## 2016-06-25 DIAGNOSIS — E538 Deficiency of other specified B group vitamins: Secondary | ICD-10-CM | POA: Diagnosis not present

## 2016-07-02 DIAGNOSIS — E1165 Type 2 diabetes mellitus with hyperglycemia: Secondary | ICD-10-CM | POA: Diagnosis not present

## 2016-07-02 DIAGNOSIS — I10 Essential (primary) hypertension: Secondary | ICD-10-CM | POA: Diagnosis not present

## 2016-07-02 DIAGNOSIS — M150 Primary generalized (osteo)arthritis: Secondary | ICD-10-CM | POA: Diagnosis not present

## 2016-07-08 DIAGNOSIS — E1165 Type 2 diabetes mellitus with hyperglycemia: Secondary | ICD-10-CM | POA: Diagnosis not present

## 2016-10-29 DIAGNOSIS — E7800 Pure hypercholesterolemia, unspecified: Secondary | ICD-10-CM | POA: Diagnosis not present

## 2016-10-29 DIAGNOSIS — B351 Tinea unguium: Secondary | ICD-10-CM | POA: Diagnosis not present

## 2016-10-29 DIAGNOSIS — E1165 Type 2 diabetes mellitus with hyperglycemia: Secondary | ICD-10-CM | POA: Diagnosis not present

## 2016-10-29 DIAGNOSIS — E119 Type 2 diabetes mellitus without complications: Secondary | ICD-10-CM | POA: Diagnosis not present

## 2016-10-29 DIAGNOSIS — I10 Essential (primary) hypertension: Secondary | ICD-10-CM | POA: Diagnosis not present

## 2016-10-29 DIAGNOSIS — M150 Primary generalized (osteo)arthritis: Secondary | ICD-10-CM | POA: Diagnosis not present

## 2017-01-26 DIAGNOSIS — M150 Primary generalized (osteo)arthritis: Secondary | ICD-10-CM | POA: Diagnosis not present

## 2017-01-26 DIAGNOSIS — I10 Essential (primary) hypertension: Secondary | ICD-10-CM | POA: Diagnosis not present

## 2017-01-26 DIAGNOSIS — Z23 Encounter for immunization: Secondary | ICD-10-CM | POA: Diagnosis not present

## 2017-01-26 DIAGNOSIS — E1165 Type 2 diabetes mellitus with hyperglycemia: Secondary | ICD-10-CM | POA: Diagnosis not present

## 2017-04-09 DIAGNOSIS — E785 Hyperlipidemia, unspecified: Secondary | ICD-10-CM | POA: Diagnosis not present

## 2017-04-09 DIAGNOSIS — I10 Essential (primary) hypertension: Secondary | ICD-10-CM | POA: Diagnosis not present

## 2017-04-09 DIAGNOSIS — E119 Type 2 diabetes mellitus without complications: Secondary | ICD-10-CM | POA: Diagnosis not present

## 2017-04-09 DIAGNOSIS — K219 Gastro-esophageal reflux disease without esophagitis: Secondary | ICD-10-CM | POA: Diagnosis not present

## 2017-04-13 ENCOUNTER — Other Ambulatory Visit: Payer: Self-pay | Admitting: Nurse Practitioner

## 2017-04-13 DIAGNOSIS — Z1231 Encounter for screening mammogram for malignant neoplasm of breast: Secondary | ICD-10-CM

## 2017-04-27 DIAGNOSIS — E119 Type 2 diabetes mellitus without complications: Secondary | ICD-10-CM | POA: Diagnosis not present

## 2017-05-25 ENCOUNTER — Ambulatory Visit
Admission: RE | Admit: 2017-05-25 | Discharge: 2017-05-25 | Disposition: A | Discharge status: Home / Self Care | Payer: Medicare Other | Source: Ambulatory Visit | Attending: Nurse Practitioner | Admitting: Nurse Practitioner

## 2017-05-25 DIAGNOSIS — Z1231 Encounter for screening mammogram for malignant neoplasm of breast: Secondary | ICD-10-CM | POA: Diagnosis not present

## 2017-06-01 DIAGNOSIS — H401132 Primary open-angle glaucoma, bilateral, moderate stage: Secondary | ICD-10-CM | POA: Diagnosis not present

## 2017-07-02 DIAGNOSIS — E119 Type 2 diabetes mellitus without complications: Secondary | ICD-10-CM | POA: Diagnosis not present

## 2017-07-02 DIAGNOSIS — M545 Low back pain: Secondary | ICD-10-CM | POA: Diagnosis not present

## 2017-07-02 DIAGNOSIS — E785 Hyperlipidemia, unspecified: Secondary | ICD-10-CM | POA: Diagnosis not present

## 2017-07-02 DIAGNOSIS — K219 Gastro-esophageal reflux disease without esophagitis: Secondary | ICD-10-CM | POA: Diagnosis not present

## 2017-07-02 DIAGNOSIS — I10 Essential (primary) hypertension: Secondary | ICD-10-CM | POA: Diagnosis not present

## 2017-07-10 ENCOUNTER — Other Ambulatory Visit: Payer: Self-pay | Admitting: Nurse Practitioner

## 2017-07-10 DIAGNOSIS — E2839 Other primary ovarian failure: Secondary | ICD-10-CM

## 2017-07-16 DIAGNOSIS — N200 Calculus of kidney: Secondary | ICD-10-CM | POA: Diagnosis not present

## 2017-09-01 DIAGNOSIS — H401132 Primary open-angle glaucoma, bilateral, moderate stage: Secondary | ICD-10-CM | POA: Diagnosis not present

## 2017-12-01 DIAGNOSIS — H401132 Primary open-angle glaucoma, bilateral, moderate stage: Secondary | ICD-10-CM | POA: Diagnosis not present

## 2018-01-13 ENCOUNTER — Ambulatory Visit (INDEPENDENT_AMBULATORY_CARE_PROVIDER_SITE_OTHER): Payer: Medicare Other | Admitting: Nurse Practitioner

## 2018-01-13 ENCOUNTER — Encounter: Payer: Self-pay | Admitting: Nurse Practitioner

## 2018-01-13 VITALS — BP 170/72 | HR 74 | Temp 98.3°F | Ht 63.75 in | Wt 139.8 lb

## 2018-01-13 DIAGNOSIS — Z79899 Other long term (current) drug therapy: Secondary | ICD-10-CM | POA: Diagnosis not present

## 2018-01-13 DIAGNOSIS — I10 Essential (primary) hypertension: Secondary | ICD-10-CM | POA: Diagnosis not present

## 2018-01-13 DIAGNOSIS — D509 Iron deficiency anemia, unspecified: Secondary | ICD-10-CM | POA: Diagnosis not present

## 2018-01-13 DIAGNOSIS — E559 Vitamin D deficiency, unspecified: Secondary | ICD-10-CM

## 2018-01-13 DIAGNOSIS — R5383 Other fatigue: Secondary | ICD-10-CM | POA: Diagnosis not present

## 2018-01-13 DIAGNOSIS — Z23 Encounter for immunization: Secondary | ICD-10-CM | POA: Diagnosis not present

## 2018-01-13 DIAGNOSIS — E119 Type 2 diabetes mellitus without complications: Secondary | ICD-10-CM | POA: Diagnosis not present

## 2018-01-13 NOTE — Progress Notes (Addendum)
Subjective:     Patient ID: Elizabeth Mcfarland , female    DOB: 1940-04-17 , 77 y.o.   MRN: [id number]   Fatigue - the last 2 weeks has felt this way.  She is sleeping approximately 10 hours per night average.    Diabetes  She presents for her follow-up diabetic visit. She has type 2 diabetes mellitus. Her disease course has been stable. Pertinent negatives for hypoglycemia include no dizziness or headaches. Associated symptoms include fatigue. Pertinent negatives for diabetes include no blurred vision, no polydipsia and no polyphagia. There are no hypoglycemic complications. Symptoms are stable. Pertinent negatives for diabetic complications include no retinopathy. Risk factors for coronary artery disease include hypertension. She is compliant with treatment all of the time. She is following a diabetic (Recently eating more salt) diet. When asked about meal planning, she reported none. She has not had a previous visit with a dietitian. Exercise: Gardening - 5-6 hours. (Mornings - 90-145 blood sugar  She is taking a half pill - glimiperide.) An ACE inhibitor/angiotensin II receptor blocker is being taken. Eye exam is current.  Hypertension  This is a chronic problem. The current episode started more than 1 year ago. The problem has been gradually worsening since onset. The problem is uncontrolled. Pertinent negatives include no blurred vision, headaches, palpitations or peripheral edema. Risk factors for coronary artery disease include diabetes mellitus, obesity and sedentary lifestyle. Past treatments include ACE inhibitors. The current treatment provides mild improvement. Compliance problems include diet.  There is no history of kidney disease or retinopathy.     Past Medical History:  Diagnosis Date  . Adenomatous colon polyp   . Anemia   . Diabetes mellitus without complication ([place])   . Esophageal ulcer with bleeding 05/05/2013   05/05/2013 EGD linear distal esophageal ulcer - cause of hematemesis  prior to EGD   . Hypertension       Current Outpatient Medications:  .  acetaminophen (TYLENOL) 500 MG tablet, Take 1,000 mg by mouth daily as needed for headache (pain)., Disp: , Rfl:  .  amLODipine (NORVASC) 10 MG tablet, Take 10 mg by mouth daily. , Disp: , Rfl:  .  Ascorbic Acid (VITAMIN C) 1000 MG tablet, Take 1,000 mg by mouth daily., Disp: , Rfl:  .  aspirin EC 81 MG tablet, Take 81 mg by mouth daily., Disp: , Rfl:  .  cholecalciferol (VITAMIN D) 1000 UNITS tablet, Take 1,000 Units by mouth daily. , Disp: , Rfl:  .  glimepiride (AMARYL) 4 MG tablet, Take 2 mg by mouth at bedtime. , Disp: , Rfl:  .  Multiple Vitamin (MULTIVITAMIN WITH MINERALS) TABS tablet, Take 1 tablet by mouth daily. Centrum Silver, Disp: , Rfl:  .  Polyethyl Glycol-Propyl Glycol (SYSTANE OP), Place 1 drop into both eyes daily as needed (dry eyes). , Disp: , Rfl:  .  polyethylene glycol (MIRALAX / GLYCOLAX) packet, Take 17 g by mouth daily. Mix in 8 oz liquid and drink, Disp: , Rfl:  .  sitaGLIPtin-metformin (JANUMET) 50-500 MG tablet, Take 1 tablet by mouth daily with breakfast., Disp: , Rfl:    Allergies  Allergen Reactions  . Codeine Nausea And Vomiting  . Percocet [Oxycodone-Acetaminophen] Nausea And Vomiting     Review of Systems  Constitutional: Positive for fatigue. Negative for activity change, appetite change, chills, diaphoresis, fever and unexpected weight change.  HENT: Positive for rhinorrhea. Negative for postnasal drip.   Eyes: Negative for blurred vision.  Respiratory: Negative.   Cardiovascular: Negative.  Negative for palpitations.  Gastrointestinal: Negative.   Endocrine: Negative for polydipsia and polyphagia.  Genitourinary: Negative.   Musculoskeletal: Negative.   Skin: Negative.   Neurological: Negative for dizziness, syncope, light-headedness and headaches.     Today's Vitals   01/13/18 0908  BP: (!) 170/72  Pulse: 74  Temp: 98.3 F (36.8 C)  TempSrc: Oral  SpO2: 96%   Weight: 139 lb 12.8 oz (63.4 kg)  Height: 5' 3.75" (1.619 m)  PainSc: 0-No pain   Body mass index is 24.19 kg/m.   Objective:  Physical Exam  Constitutional: She appears well-developed and well-nourished.  Cardiovascular: Normal rate, regular rhythm, normal heart sounds and intact distal pulses.  Pulmonary/Chest: Effort normal and breath sounds normal.  Skin: Skin is warm and dry.        Assessment And Plan:     1. Essential hypertension  Chronic, elevated blood pressure today likely related to eating more foods high in salt in recent weeks.  Continue with current medications  She is to check her blood pressure at home and call with her readings.    2. Diabetes mellitus type 2 in nonobese ([facility])  Chronic, controlled  Continue with current medications  3. Fatigue, unspecified type  2 week history of fatigue  Will check for metabolic causes.  4. Need for influenza vaccination  Influenza vaccine administered  Encouraged to take Tylenol as needed for fever or muscle aches. - Flu vaccine HIGH DOSE PF (Fluzone High dose)   5. Vitamin D deficiency  Chronic  Will check vitamin d level - Vitamin D (25 hydroxy)  6. Iron deficiency anemia, unspecified iron deficiency anemia type  Chronic  Will check vitamin b12 levels - Vitamin B12  7. Encounter for long-term (current) use of medications  - Vitamin B12      [name], FNP

## 2018-01-14 LAB — CMP14 + ANION GAP
ALBUMIN: 4.5 g/dL (ref 3.5–4.8)
ALK PHOS: 75 IU/L (ref 39–117)
ALT: 22 IU/L (ref 0–32)
ANION GAP: 12 mmol/L (ref 10.0–18.0)
AST: 23 IU/L (ref 0–40)
Albumin/Globulin Ratio: 2 (ref 1.2–2.2)
BILIRUBIN TOTAL: 0.4 mg/dL (ref 0.0–1.2)
BUN / CREAT RATIO: 12 (ref 12–28)
BUN: 9 mg/dL (ref 8–27)
CHLORIDE: 100 mmol/L (ref 96–106)
CO2: 25 mmol/L (ref 20–29)
CREATININE: 0.78 mg/dL (ref 0.57–1.00)
Calcium: 9.9 mg/dL (ref 8.7–10.3)
GFR calc Af Amer: 86 mL/min/{1.73_m2} (ref >59)
GFR calc non Af Amer: 75 mL/min/{1.73_m2} (ref >59)
GLOBULIN, TOTAL: 2.3 g/dL (ref 1.5–4.5)
Glucose: 129 mg/dL — ABNORMAL HIGH (ref 65–99)
POTASSIUM: 4.3 mmol/L (ref 3.5–5.2)
SODIUM: 137 mmol/L (ref 134–144)
Total Protein: 6.8 g/dL (ref 6.0–8.5)

## 2018-01-14 LAB — HEMOGLOBIN A1C
ESTIMATED AVERAGE GLUCOSE: 143 mg/dL
HEMOGLOBIN A1C: 6.6 % — AB (ref 4.8–5.6)

## 2018-01-14 LAB — CBC
HEMATOCRIT: 37.3 % (ref 34.0–46.6)
Hemoglobin: 11.8 g/dL (ref 11.1–15.9)
MCH: 28.1 pg (ref 26.6–33.0)
MCHC: 31.6 g/dL (ref 31.5–35.7)
MCV: 89 fL (ref 79–97)
PLATELETS: 290 10*3/uL (ref 150–450)
RBC: 4.2 x10E6/uL (ref 3.77–5.28)
RDW: 11.8 % — AB (ref 12.3–15.4)
WBC: 5.2 10*3/uL (ref 3.4–10.8)

## 2018-01-14 LAB — TSH: TSH: 1.31 u[IU]/mL (ref 0.450–4.500)

## 2018-01-14 LAB — VITAMIN B12: Vitamin B-12: 1596 pg/mL — ABNORMAL HIGH (ref 232–1245)

## 2018-01-14 LAB — VITAMIN D 25 HYDROXY (VIT D DEFICIENCY, FRACTURES): Vit D, 25-Hydroxy: 41.4 ng/mL (ref 30.0–100.0)

## 2018-01-15 ENCOUNTER — Telehealth: Payer: Self-pay

## 2018-01-15 NOTE — Telephone Encounter (Signed)
Patient called stating her she was advised by you to call with her blood pressure reading she stated today it was 137/74. YRL,RMA

## 2018-01-15 NOTE — Telephone Encounter (Signed)
Okay, this is better.  Thanks

## 2018-01-18 NOTE — Telephone Encounter (Signed)
Attempted to call pt unable to leave v/m. YRL,RMA

## 2018-01-19 NOTE — Telephone Encounter (Signed)
2nd attempt [facility]

## 2018-01-20 NOTE — Telephone Encounter (Signed)
Patient notified YRL,RMA 

## 2018-02-28 ENCOUNTER — Other Ambulatory Visit: Payer: Self-pay | Admitting: Nurse Practitioner

## 2018-04-05 ENCOUNTER — Telehealth: Payer: Self-pay | Admitting: Nurse Practitioner

## 2018-04-05 NOTE — Telephone Encounter (Signed)
PT LVM WANTING TO CONFIRM NEXT APPT. ATT TO CONTACT PT TO ADVISE OF NEXT APPT NO ANS UNABLE TO LVM NO MAILBOX SET UP

## 2018-04-13 ENCOUNTER — Telehealth: Payer: Self-pay | Admitting: Nurse Practitioner

## 2018-04-15 ENCOUNTER — Ambulatory Visit: Payer: Medicare Other | Admitting: Nurse Practitioner

## 2018-04-20 ENCOUNTER — Other Ambulatory Visit: Payer: Self-pay | Admitting: Nurse Practitioner

## 2018-04-20 DIAGNOSIS — H524 Presbyopia: Secondary | ICD-10-CM | POA: Diagnosis not present

## 2018-04-20 DIAGNOSIS — H52223 Regular astigmatism, bilateral: Secondary | ICD-10-CM | POA: Diagnosis not present

## 2018-04-20 DIAGNOSIS — Z961 Presence of intraocular lens: Secondary | ICD-10-CM | POA: Diagnosis not present

## 2018-04-20 DIAGNOSIS — H43813 Vitreous degeneration, bilateral: Secondary | ICD-10-CM | POA: Diagnosis not present

## 2018-04-20 DIAGNOSIS — Z1231 Encounter for screening mammogram for malignant neoplasm of breast: Secondary | ICD-10-CM

## 2018-04-20 DIAGNOSIS — H5202 Hypermetropia, left eye: Secondary | ICD-10-CM | POA: Diagnosis not present

## 2018-04-20 DIAGNOSIS — E11319 Type 2 diabetes mellitus with unspecified diabetic retinopathy without macular edema: Secondary | ICD-10-CM | POA: Diagnosis not present

## 2018-04-20 DIAGNOSIS — H401130 Primary open-angle glaucoma, bilateral, stage unspecified: Secondary | ICD-10-CM | POA: Diagnosis not present

## 2018-04-20 LAB — HM DIABETES EYE EXAM

## 2018-05-04 ENCOUNTER — Other Ambulatory Visit: Payer: Self-pay | Admitting: Nurse Practitioner

## 2018-05-31 ENCOUNTER — Other Ambulatory Visit: Payer: Self-pay | Admitting: Nurse Practitioner

## 2018-06-17 ENCOUNTER — Other Ambulatory Visit: Payer: Medicare Other

## 2018-06-17 ENCOUNTER — Ambulatory Visit: Payer: Medicare Other

## 2018-06-22 ENCOUNTER — Encounter: Payer: Self-pay | Admitting: Internal Medicine

## 2018-07-01 ENCOUNTER — Other Ambulatory Visit: Payer: Self-pay | Admitting: Nurse Practitioner

## 2018-07-01 DIAGNOSIS — E2839 Other primary ovarian failure: Secondary | ICD-10-CM

## 2018-07-06 ENCOUNTER — Telehealth: Payer: Self-pay

## 2018-07-06 NOTE — Telephone Encounter (Signed)
Returned pt call and advised her that we still are currently seeing patients the only way she would not be able to be seen is if she has a fever. YRL,RMA

## 2018-07-08 ENCOUNTER — Other Ambulatory Visit: Payer: Self-pay

## 2018-07-08 ENCOUNTER — Ambulatory Visit (INDEPENDENT_AMBULATORY_CARE_PROVIDER_SITE_OTHER): Payer: Medicare Other | Admitting: Nurse Practitioner

## 2018-07-08 ENCOUNTER — Encounter: Payer: Self-pay | Admitting: Nurse Practitioner

## 2018-07-08 ENCOUNTER — Ambulatory Visit (INDEPENDENT_AMBULATORY_CARE_PROVIDER_SITE_OTHER): Payer: Medicare Other

## 2018-07-08 VITALS — BP 150/78 | HR 78 | Temp 98.3°F | Ht 63.0 in | Wt 141.4 lb

## 2018-07-08 DIAGNOSIS — E118 Type 2 diabetes mellitus with unspecified complications: Secondary | ICD-10-CM

## 2018-07-08 DIAGNOSIS — E1165 Type 2 diabetes mellitus with hyperglycemia: Secondary | ICD-10-CM | POA: Diagnosis not present

## 2018-07-08 DIAGNOSIS — IMO0002 Reserved for concepts with insufficient information to code with codable children: Secondary | ICD-10-CM

## 2018-07-08 DIAGNOSIS — K219 Gastro-esophageal reflux disease without esophagitis: Secondary | ICD-10-CM | POA: Diagnosis not present

## 2018-07-08 DIAGNOSIS — I10 Essential (primary) hypertension: Secondary | ICD-10-CM

## 2018-07-08 DIAGNOSIS — Z0000 Encounter for general adult medical examination without abnormal findings: Secondary | ICD-10-CM | POA: Diagnosis not present

## 2018-07-08 LAB — POCT URINALYSIS DIPSTICK
Bilirubin, UA: NEGATIVE
Blood, UA: NEGATIVE
Glucose, UA: NEGATIVE
Ketones, UA: NEGATIVE
Leukocytes, UA: NEGATIVE
Nitrite, UA: NEGATIVE
Protein, UA: NEGATIVE
Spec Grav, UA: 1.01 (ref 1.010–1.025)
Urobilinogen, UA: 0.2 E.U./dL
pH, UA: 6 (ref 5.0–8.0)

## 2018-07-08 LAB — POCT UA - MICROALBUMIN
Creatinine, POC: 50 mg/dL
Microalbumin Ur, POC: 10 mg/L

## 2018-07-08 NOTE — Progress Notes (Signed)
Subjective:   Elizabeth Mcfarland is a 78 y.o. female who presents for Medicare Annual (Subsequent) preventive examination.  Review of Systems:  n/a Cardiac Risk Factors include: advanced age (>12men, >17 women);hypertension;diabetes mellitus     Objective:     Vitals: BP (!) 150/78 (BP Location: Left Arm, Patient Position: Sitting)   Pulse 78   Temp 98.3 F (36.8 C) (Oral)   Ht 5\' 3"  (1.6 m)   Wt 141 lb 6.4 oz (64.1 kg)   SpO2 98%   BMI 25.05 kg/m   Body mass index is 25.05 kg/m.  Advanced Directives 07/08/2018 12/23/2015 05/05/2013  Does Patient Have a Medical Advance Directive? Yes No Patient does not have advance directive  Type of Advance Directive Living will - -  Does patient want to make changes to medical advance directive? No - Guardian declined - -    Tobacco Social History   Tobacco Use  Smoking Status Never Smoker  Smokeless Tobacco Never Used     Counseling given: Not Answered   Clinical Intake:  Pre-visit preparation completed: Yes  Pain : No/denies pain Pain Score: 0-No pain     Nutritional Status: BMI 25 -29 Overweight Nutritional Risks: None Diabetes: Yes CBG done?: No Did pt. bring in CBG monitor from home?: No  How often do you need to have someone help you when you read instructions, pamphlets, or other written materials from your doctor or pharmacy?: 1 - Never What is the last grade level you completed in school?: GED  Interpreter Needed?: No  Information entered by :: NAllen LPN  Past Medical History:  Diagnosis Date  . Adenomatous colon polyp   . Anemia   . Diabetes mellitus without complication ([place])   . Esophageal ulcer with bleeding 05/05/2013   05/05/2013 EGD linear distal esophageal ulcer - cause of hematemesis prior to EGD   . Hypertension    Past Surgical History:  Procedure Laterality Date  . ABDOMINAL HYSTERECTOMY    . COLONOSCOPY, ESOPHAGOGASTRODUODENOSCOPY (EGD) AND ESOPHAGEAL DILATION N/A 05/05/2013   Procedure:  COLONOSCOPY, ESOPHAGOGASTRODUODENOSCOPY (EGD) AND ESOPHAGEAL DILATION (ED);  Surgeon: Gatha Mayer, MD;  Location: [facility];  Service: Endoscopy;  Laterality: N/A;  . LITHOTRIPSY     Family History  Problem Relation Age of Onset  . Diabetes Sister   . Kidney failure Sister   . CAD Maternal Grandmother   . Stroke Father   . Stroke Brother   . Heart disease Brother   . Emphysema Brother   . Asthma Brother   . Breast cancer Neg Hx    Social History   Socioeconomic History  . Marital status: Single    Spouse name: Not on file  . Number of children: 1  . Years of education: Not on file  . Highest education level: Not on file  Occupational History  . Occupation: Physiological scientist: SELF-EMPLOYED  . Occupation: retired  Scientific laboratory technician  . Financial resource strain: Not hard at all  . Food insecurity:    Worry: Never true    Inability: Never true  . Transportation needs:    Medical: No    Non-medical: No  Tobacco Use  . Smoking status: Never Smoker  . Smokeless tobacco: Never Used  Substance and Sexual Activity  . Alcohol use: No  . Drug use: No  . Sexual activity: Not Currently  Lifestyle  . Physical activity:    Days per week: 0 days    Minutes per session: 0 min  .  Stress: Not at all  Relationships  . Social connections:    Talks on phone: Not on file    Gets together: Not on file    Attends religious service: Not on file    Active member of club or organization: Not on file    Attends meetings of clubs or organizations: Not on file    Relationship status: Not on file  Other Topics Concern  . Not on file  Social History Narrative  . Not on file    Outpatient Encounter Medications as of 07/08/2018  Medication Sig  . acetaminophen (TYLENOL) 500 MG tablet Take 1,000 mg by mouth daily as needed for headache (pain).  Marland Kitchen amLODipine (NORVASC) 10 MG tablet TAKE 1 TABLET BY MOUTH EVERY DAY  . Ascorbic Acid (VITAMIN C) 1000 MG tablet Take 1,000 mg by mouth daily.   Marland Kitchen aspirin EC 81 MG tablet Take 81 mg by mouth daily.  . cholecalciferol (VITAMIN D) 1000 UNITS tablet Take 1,000 Units by mouth daily.   Marland Kitchen glimepiride (AMARYL) 4 MG tablet TAKE 1 TABLET BY MOUTH TWICE DAILY  . JANUMET 50-500 MG tablet TAKE 1 TABLET BY MOUTH EVERY DAY WITH MEALS  . Multiple Vitamin (MULTIVITAMIN WITH MINERALS) TABS tablet Take 1 tablet by mouth daily. Centrum Silver  . Polyethyl Glycol-Propyl Glycol (SYSTANE OP) Place 1 drop into both eyes daily as needed (dry eyes).   . polyethylene glycol (MIRALAX / GLYCOLAX) packet Take 17 g by mouth daily. Mix in 8 oz liquid and drink   No facility-administered encounter medications on file as of 07/08/2018.     Activities of Daily Living In your present state of health, do you have any difficulty performing the following activities: 07/08/2018  Hearing? N  Vision? N  Difficulty concentrating or making decisions? N  Walking or climbing stairs? N  Dressing or bathing? N  Doing errands, shopping? N  Preparing Food and eating ? N  Using the Toilet? N  In the past six months, have you accidently leaked urine? Y  Do you have problems with loss of bowel control? N  Managing your Medications? N  Managing your Finances? N  Housekeeping or managing your Housekeeping? N  Some recent data might be hidden    Patient Care Team: [name], FNP as PCP - General (General Practice)    Assessment:   This is a routine wellness examination for [facility].  Exercise Activities and Dietary recommendations Current Exercise Habits: Home exercise routine, Time (Minutes): > 60, Frequency (Times/Week): 7, Weekly Exercise (Minutes/Week): 0, Intensity: Moderate  Goals    . Patient Stated     No goals       Fall Risk Fall Risk  07/08/2018 01/13/2018  Falls in the past year? 0 No  Risk for fall due to : Medication side effect -  Follow up Education provided;Falls prevention discussed -   Is the patient's home free of loose throw rugs in  walkways, pet beds, electrical cords, etc?   yes      Grab bars in the bathroom? no      Handrails on the stairs?   no      Adequate lighting?   yes  Timed Get Up and Go performed: n/a  Depression Screen PHQ 2/9 Scores 07/08/2018 01/13/2018  PHQ - 2 Score 0 0  PHQ- 9 Score 0 -     Cognitive Function        Immunization History  Administered Date(s) Administered  . Influenza, High Dose Seasonal  PF 01/13/2018    Qualifies for Shingles Vaccine? yes  Screening Tests Health Maintenance  Topic Date Due  . FOOT EXAM  08/14/1952  . URINE MICROALBUMIN  08/14/1952  . TETANUS/TDAP  08/14/1961  . DEXA SCAN  08/15/2007  . PNA vac Low Risk Adult (1 of 2 - PCV13) 08/15/2007  . HEMOGLOBIN A1C  07/15/2018  . INFLUENZA VACCINE  10/23/2018  . OPHTHALMOLOGY EXAM  04/13/2019  . COLONOSCOPY  05/06/2023    Cancer Screenings: Lung: Low Dose CT Chest recommended if Age 54-80 years, 30 pack-year currently smoking OR have quit w/in 15years. Patient does not qualify. Breast:  Up to date on Mammogram? No   Up to date of Bone Density/Dexa? No Colorectal: up to date  Additional Screenings: : Hepatitis C Screening: n/a     Plan:    No goals set.   I have personally reviewed and noted the following in the patient's chart:   . Medical and social history . Use of alcohol, tobacco or illicit drugs  . Current medications and supplements . Functional ability and status . Nutritional status . Physical activity . Advanced directives . List of other physicians . Hospitalizations, surgeries, and ER visits in previous 12 months . Vitals . Screenings to include cognitive, depression, and falls . Referrals and appointments  In addition, I have reviewed and discussed with patient certain preventive protocols, quality metrics, and best practice recommendations. A written personalized care plan for preventive services as well as general preventive health recommendations were provided to patient.      Kellie Simmering, LPN  7/40/8144

## 2018-07-08 NOTE — Progress Notes (Signed)
Subjective:     Patient ID: Elizabeth Mcfarland , female    DOB: [birth year] , 78 y.o.   MRN: [id number]   No chief complaint on file.  The patient states she uses post menopausal status for birth control. Last LMP was No LMP recorded. Patient has had a hysterectomy.. Negative for Dysmenorrhea and Negative for Menorrhagia Mammogram last done 05/25/2017 Negative for: breast discharge, breast lump(s), breast pain and breast self exam.  Pertinent negatives include abnormal bleeding (hematology), anxiety, decreased libido, depression, difficulty falling sleep, dyspareunia, history of infertility, nocturia, sexual dysfunction, sleep disturbances, urinary incontinence, urinary urgency, vaginal discharge and vaginal itching. Diet regular.The patient states her exercise level is  minimal works in the garden almost daily   The patient's tobacco use is:  Social History   Tobacco Use  Smoking Status Never Smoker  Smokeless Tobacco Never Used   She has been exposed to passive smoke. The patient's alcohol use is:  Social History   Substance and Sexual Activity  Alcohol Use No   Additional information: Last pap N/A  HPI  Here for HM as well.  Hypertension  This is a chronic problem. The current episode started more than 1 year ago. The problem is uncontrolled. Pertinent negatives include no anxiety, blurred vision, headaches or palpitations. There are no associated agents to hypertension. Risk factors for coronary artery disease include sedentary lifestyle. Past treatments include calcium channel blockers.  Diabetes  She presents for her follow-up diabetic visit. She has type 2 diabetes mellitus. Her disease course has been stable. Pertinent negatives for hypoglycemia include no confusion, dizziness or headaches. Pertinent negatives for diabetes include no blurred vision. There are no hypoglycemic complications. Risk factors for coronary artery disease include hypertension and diabetes mellitus. Current  diabetic treatment includes oral agent (dual therapy). She is compliant with treatment most of the time. (Occasionally will wake up with her blood sugar in the 40's.  )     Past Medical History:  Diagnosis Date  . Adenomatous colon polyp   . Anemia   . Diabetes mellitus without complication ([place])   . Esophageal ulcer with bleeding 05/05/2013   05/05/2013 EGD linear distal esophageal ulcer - cause of hematemesis prior to EGD   . Hypertension      Family History  Problem Relation Age of Onset  . Diabetes Sister   . Kidney failure Sister   . CAD Maternal Grandmother   . Stroke Father   . Stroke Brother   . Heart disease Brother   . Emphysema Brother   . Asthma Brother   . Breast cancer Neg Hx      Current Outpatient Medications:  .  acetaminophen (TYLENOL) 500 MG tablet, Take 1,000 mg by mouth daily as needed for headache (pain)., Disp: , Rfl:  .  amLODipine (NORVASC) 10 MG tablet, TAKE 1 TABLET BY MOUTH EVERY DAY, Disp: 90 tablet, Rfl: 1 .  Ascorbic Acid (VITAMIN C) 1000 MG tablet, Take 1,000 mg by mouth daily., Disp: , Rfl:  .  aspirin EC 81 MG tablet, Take 81 mg by mouth daily., Disp: , Rfl:  .  cholecalciferol (VITAMIN D) 1000 UNITS tablet, Take 1,000 Units by mouth daily. , Disp: , Rfl:  .  glimepiride (AMARYL) 4 MG tablet, TAKE 1 TABLET BY MOUTH TWICE DAILY, Disp: 180 tablet, Rfl: 0 .  JANUMET 50-500 MG tablet, TAKE 1 TABLET BY MOUTH EVERY DAY WITH MEALS, Disp: 30 tablet, Rfl: 2 .  Multiple Vitamin (MULTIVITAMIN WITH MINERALS) TABS tablet, Take  1 tablet by mouth daily. Centrum Silver, Disp: , Rfl:  .  Polyethyl Glycol-Propyl Glycol (SYSTANE OP), Place 1 drop into both eyes daily as needed (dry eyes). , Disp: , Rfl:  .  polyethylene glycol (MIRALAX / GLYCOLAX) packet, Take 17 g by mouth daily. Mix in 8 oz liquid and drink, Disp: , Rfl:    Allergies  Allergen Reactions  . Codeine Nausea And Vomiting  . Percocet [Oxycodone-Acetaminophen] Nausea And Vomiting     Review of  Systems  Constitutional: Negative.   HENT: Negative.   Eyes: Negative.  Negative for blurred vision.  Respiratory: Negative.   Cardiovascular: Negative.  Negative for palpitations.  Gastrointestinal: Negative.   Endocrine: Negative.   Genitourinary: Negative.   Musculoskeletal: Negative.   Skin: Negative.   Allergic/Immunologic: Negative.   Neurological: Negative.  Negative for dizziness and headaches.  Hematological: Negative.   Psychiatric/Behavioral: Negative.  Negative for confusion.     Today's Vitals   07/08/18 1046  BP: (!) 150/78  Pulse: 78  Temp: 98.3 F (36.8 C)  TempSrc: Oral  SpO2: 98%  Weight: 141 lb 6.4 oz (64.1 kg)  Height: 5\' 3"  (1.6 m)   Body mass index is 25.05 kg/m.   Objective:  Physical Exam Constitutional:      Appearance: Normal appearance. She is well-developed.  HENT:     Head: Normocephalic and atraumatic.     Right Ear: Hearing, tympanic membrane, ear canal and external ear normal.     Left Ear: Hearing, tympanic membrane, ear canal and external ear normal.     Nose: Nose normal. No congestion.     Mouth/Throat:     Mouth: Mucous membranes are moist.  Eyes:     General: Lids are normal.     Conjunctiva/sclera: Conjunctivae normal.     Pupils: Pupils are equal, round, and reactive to light.     Funduscopic exam:    Right eye: No papilledema.        Left eye: No papilledema.  Neck:     Musculoskeletal: Full passive range of motion without pain, normal range of motion and neck supple.     Thyroid: No thyroid mass.     Vascular: No carotid bruit.  Cardiovascular:     Rate and Rhythm: Normal rate and regular rhythm.     Pulses: Normal pulses.     Heart sounds: Normal heart sounds. No murmur.  Pulmonary:     Effort: Pulmonary effort is normal.     Breath sounds: Normal breath sounds.  Abdominal:     General: Abdomen is flat. Bowel sounds are normal.     Palpations: Abdomen is soft.  Musculoskeletal: Normal range of motion.         General: No swelling.     Right lower leg: No edema.     Left lower leg: No edema.  Skin:    General: Skin is warm and dry.     Capillary Refill: Capillary refill takes less than 2 seconds.  Neurological:     General: No focal deficit present.     Mental Status: She is alert and oriented to person, place, and time.     Cranial Nerves: No cranial nerve deficit.     Sensory: No sensory deficit.  Psychiatric:        Mood and Affect: Mood normal.        Behavior: Behavior normal.        Thought Content: Thought content normal.  Judgment: Judgment normal.         Assessment And Plan:     1. Health maintenance examination . Behavior modifications discussed and diet history reviewed.   . Pt will continue to exercise regularly and modify diet with low GI, plant based foods and decrease intake of processed foods.  . Recommend intake of daily multivitamin, Vitamin D, and calcium.  . Recommend mammogram and colonoscopy (done last in 2015) for preventive screenings, as well as recommend immunizations that include influenza, TDAP, and Shingles  2. Essential hypertension . B/P is fairly controlled, improved since last visit.   . CMP ordered to check renal function.  . The importance of regular exercise and dietary modification was stressed to the patient.  - POCT Urinalysis Dipstick (81002) - POCT UA - Microalbumin  3. Type II diabetes mellitus with manifestations, uncontrolled (HCC) Chronic, fairly controlled Reports episodes of low blood sugars in the morning when taking her evening dose of glimiperide. Will consider decreasing dose pending lab results otherwise continue with current medications Encouraged to limit intake of sugary foods and drinks Encouraged to continue physical activity of at least 150 minutes per week  Working in the garden.   - POCT Urinalysis Dipstick (81002) - POCT UA - Microalbumin  4. Gastroesophageal reflux disease without  esophagitis  Intermittent  Encouraged to avoid high gas containing foods  If symptoms worsen return call to office    [name], FNP    THE PATIENT IS ENCOURAGED TO PRACTICE SOCIAL DISTANCING DUE TO THE COVID-19 PANDEMIC.

## 2018-07-08 NOTE — Patient Instructions (Signed)
Elizabeth Mcfarland , Thank you for taking time to come for your Medicare Wellness Visit. I appreciate your ongoing commitment to your health goals. Please review the following plan we discussed and let me know if I can assist you in the future.   Screening recommendations/referrals: Colonoscopy: 04/2013 Mammogram: scheduled for July Bone Density: scheduled for July Recommended yearly ophthalmology/optometry visit for glaucoma screening and checkup Recommended yearly dental visit for hygiene and checkup  Vaccinations: Influenza vaccine: 12/2017 Pneumococcal vaccine: states had but does not know when Tdap vaccine: does not remember when Shingles vaccine: discussed    Advanced directives: Please bring a copy of your POA (Power of [place]) and/or Living Will to your next appointment.    Conditions/risks identified: Overweight  Next appointment: 07/14/2019 at 9:30   Preventive Care 55 Years and Older, Female Preventive care refers to lifestyle choices and visits with your health care provider that can promote health and wellness. What does preventive care include?  A yearly physical exam. This is also called an annual well check.  Dental exams once or twice a year.  Routine eye exams. Ask your health care provider how often you should have your eyes checked.  Personal lifestyle choices, including:  Daily care of your teeth and gums.  Regular physical activity.  Eating a healthy diet.  Avoiding tobacco and drug use.  Limiting alcohol use.  Practicing safe sex.  Taking low-dose aspirin every day.  Taking vitamin and mineral supplements as recommended by your health care provider. What happens during an annual well check? The services and screenings done by your health care provider during your annual well check will depend on your age, overall health, lifestyle risk factors, and family history of disease. Counseling  Your health care provider may ask you questions about your:   Alcohol use.  Tobacco use.  Drug use.  Emotional well-being.  Home and relationship well-being.  Sexual activity.  Eating habits.  History of falls.  Memory and ability to understand (cognition).  Work and work Statistician.  Reproductive health. Screening  You may have the following tests or measurements:  Height, weight, and BMI.  Blood pressure.  Lipid and cholesterol levels. These may be checked every 5 years, or more frequently if you are over 6 years old.  Skin check.  Lung cancer screening. You may have this screening every year starting at age 28 if you have a 30-pack-year history of smoking and currently smoke or have quit within the past 15 years.  Fecal occult blood test (FOBT) of the stool. You may have this test every year starting at age 35.  Flexible sigmoidoscopy or colonoscopy. You may have a sigmoidoscopy every 5 years or a colonoscopy every 10 years starting at age 44.  Hepatitis C blood test.  Hepatitis B blood test.  Sexually transmitted disease (STD) testing.  Diabetes screening. This is done by checking your blood sugar (glucose) after you have not eaten for a while (fasting). You may have this done every 1-3 years.  Bone density scan. This is done to screen for osteoporosis. You may have this done starting at age 66.  Mammogram. This may be done every 1-2 years. Talk to your health care provider about how often you should have regular mammograms. Talk with your health care provider about your test results, treatment options, and if necessary, the need for more tests. Vaccines  Your health care provider may recommend certain vaccines, such as:  Influenza vaccine. This is recommended every year.  Tetanus,  diphtheria, and acellular pertussis (Tdap, Td) vaccine. You may need a Td booster every 10 years.  Zoster vaccine. You may need this after age 2.  Pneumococcal 13-valent conjugate (PCV13) vaccine. One dose is recommended after age 74.   Pneumococcal polysaccharide (PPSV23) vaccine. One dose is recommended after age 42. Talk to your health care provider about which screenings and vaccines you need and how often you need them. This information is not intended to replace advice given to you by your health care provider. Make sure you discuss any questions you have with your health care provider. Document Released: 04/06/2015 Document Revised: 11/28/2015 Document Reviewed: 01/09/2015 Elsevier Interactive Patient Education  2017 [place] Prevention in the Home Falls can cause injuries. They can happen to people of all ages. There are many things you can do to make your home safe and to help prevent falls. What can I do on the outside of my home?  Regularly fix the edges of walkways and driveways and fix any cracks.  Remove anything that might make you trip as you walk through a door, such as a raised step or threshold.  Trim any bushes or trees on the path to your home.  Use bright outdoor lighting.  Clear any walking paths of anything that might make someone trip, such as rocks or tools.  Regularly check to see if handrails are loose or broken. Make sure that both sides of any steps have handrails.  Any raised decks and porches should have guardrails on the edges.  Have any leaves, snow, or ice cleared regularly.  Use sand or salt on walking paths during winter.  Clean up any spills in your garage right away. This includes oil or grease spills. What can I do in the bathroom?  Use night lights.  Install grab bars by the toilet and in the tub and shower. Do not use towel bars as grab bars.  Use non-skid mats or decals in the tub or shower.  If you need to sit down in the shower, use a plastic, non-slip stool.  Keep the floor dry. Clean up any water that spills on the floor as soon as it happens.  Remove soap buildup in the tub or shower regularly.  Attach bath mats securely with double-sided  non-slip rug tape.  Do not have throw rugs and other things on the floor that can make you trip. What can I do in the bedroom?  Use night lights.  Make sure that you have a light by your bed that is easy to reach.  Do not use any sheets or blankets that are too big for your bed. They should not hang down onto the floor.  Have a firm chair that has side arms. You can use this for support while you get dressed.  Do not have throw rugs and other things on the floor that can make you trip. What can I do in the kitchen?  Clean up any spills right away.  Avoid walking on wet floors.  Keep items that you use a lot in easy-to-reach places.  If you need to reach something above you, use a strong step stool that has a grab bar.  Keep electrical cords out of the way.  Do not use floor polish or wax that makes floors slippery. If you must use wax, use non-skid floor wax.  Do not have throw rugs and other things on the floor that can make you trip. What can I do with  my stairs?  Do not leave any items on the stairs.  Make sure that there are handrails on both sides of the stairs and use them. Fix handrails that are broken or loose. Make sure that handrails are as long as the stairways.  Check any carpeting to make sure that it is firmly attached to the stairs. Fix any carpet that is loose or worn.  Avoid having throw rugs at the top or bottom of the stairs. If you do have throw rugs, attach them to the floor with carpet tape.  Make sure that you have a light switch at the top of the stairs and the bottom of the stairs. If you do not have them, ask someone to add them for you. What else can I do to help prevent falls?  Wear shoes that:  Do not have high heels.  Have rubber bottoms.  Are comfortable and fit you well.  Are closed at the toe. Do not wear sandals.  If you use a stepladder:  Make sure that it is fully opened. Do not climb a closed stepladder.  Make sure that both  sides of the stepladder are locked into place.  Ask someone to hold it for you, if possible.  Clearly mark and make sure that you can see:  Any grab bars or handrails.  First and last steps.  Where the edge of each step is.  Use tools that help you move around (mobility aids) if they are needed. These include:  Canes.  Walkers.  Scooters.  Crutches.  Turn on the lights when you go into a dark area. Replace any light bulbs as soon as they burn out.  Set up your furniture so you have a clear path. Avoid moving your furniture around.  If any of your floors are uneven, fix them.  If there are any pets around you, be aware of where they are.  Review your medicines with your doctor. Some medicines can make you feel dizzy. This can increase your chance of falling. Ask your doctor what other things that you can do to help prevent falls. This information is not intended to replace advice given to you by your health care provider. Make sure you discuss any questions you have with your health care provider. Document Released: 01/04/2009 Document Revised: 08/16/2015 Document Reviewed: 04/14/2014 Elsevier Interactive Patient Education  2017 [employer].

## 2018-07-09 LAB — BMP8+EGFR
BUN/Creatinine Ratio: 16 (ref 12–28)
BUN: 11 mg/dL (ref 8–27)
CO2: 25 mmol/L (ref 20–29)
Calcium: 10.5 mg/dL — ABNORMAL HIGH (ref 8.7–10.3)
Chloride: 100 mmol/L (ref 96–106)
Creatinine, Ser: 0.67 mg/dL (ref 0.57–1.00)
GFR calc Af Amer: 99 mL/min/{1.73_m2} (ref >59)
GFR calc non Af Amer: 86 mL/min/{1.73_m2} (ref >59)
Glucose: 127 mg/dL — ABNORMAL HIGH (ref 65–99)
Potassium: 4.3 mmol/L (ref 3.5–5.2)
Sodium: 139 mmol/L (ref 134–144)

## 2018-07-09 LAB — CBC
Hematocrit: 37.6 % (ref 34.0–46.6)
Hemoglobin: 12.4 g/dL (ref 11.1–15.9)
MCH: 29.8 pg (ref 26.6–33.0)
MCHC: 33 g/dL (ref 31.5–35.7)
MCV: 90 fL (ref 79–97)
Platelets: 283 10*3/uL (ref 150–450)
RBC: 4.16 x10E6/uL (ref 3.77–5.28)
RDW: 11.9 % (ref 11.7–15.4)
WBC: 5.6 10*3/uL (ref 3.4–10.8)

## 2018-07-09 LAB — LIPID PANEL
Chol/HDL Ratio: 2.3 ratio (ref 0.0–4.4)
Cholesterol, Total: 222 mg/dL — ABNORMAL HIGH (ref 100–199)
HDL: 96 mg/dL (ref >39)
LDL Calculated: 121 mg/dL — ABNORMAL HIGH (ref 0–99)
Triglycerides: 26 mg/dL (ref 0–149)
VLDL Cholesterol Cal: 5 mg/dL (ref 5–40)

## 2018-07-09 LAB — HEMOGLOBIN A1C
Est. average glucose Bld gHb Est-mCnc: 140 mg/dL
Hgb A1c MFr Bld: 6.5 % — ABNORMAL HIGH (ref 4.8–5.6)

## 2018-07-12 ENCOUNTER — Telehealth: Payer: Self-pay

## 2018-07-12 NOTE — Telephone Encounter (Signed)
-----   Message from [name], [place] sent at 07/12/2018  8:58 AM EDT ----- Blood levels are essentially normal.  Total cholesterol is 222 goal is less than 199, LDL is 121 goal is less than 99. Limit your intake of fried and fatty foods.  Kidney functions are normal. HgbA1c is 6.5 this is stable, but limit your intake of sugary foods and drinks.

## 2018-07-23 ENCOUNTER — Encounter: Payer: Self-pay | Admitting: Nurse Practitioner

## 2018-08-10 DIAGNOSIS — H401131 Primary open-angle glaucoma, bilateral, mild stage: Secondary | ICD-10-CM | POA: Diagnosis not present

## 2018-08-21 ENCOUNTER — Other Ambulatory Visit: Payer: Self-pay | Admitting: Nurse Practitioner

## 2018-08-23 ENCOUNTER — Encounter: Payer: Self-pay | Admitting: Internal Medicine

## 2018-08-28 ENCOUNTER — Other Ambulatory Visit: Payer: Self-pay | Admitting: Nurse Practitioner

## 2018-09-06 ENCOUNTER — Ambulatory Visit (AMBULATORY_SURGERY_CENTER): Payer: Self-pay

## 2018-09-06 ENCOUNTER — Other Ambulatory Visit: Payer: Self-pay

## 2018-09-06 VITALS — Ht 64.0 in | Wt 140.0 lb

## 2018-09-06 DIAGNOSIS — Z86010 Personal history of colonic polyps: Secondary | ICD-10-CM

## 2018-09-06 NOTE — Progress Notes (Signed)
Denies allergies to eggs or soy products. Denies complication of anesthesia or sedation. Denies use of weight loss medication. Denies use of O2.   Emmi instructions given for colonoscopy.  Patient came into the office for Pre-Visit. Instructions were reviewed with the patient. Patient was given an opportunity to ask questions. All forms were signed.

## 2018-09-20 ENCOUNTER — Telehealth: Payer: Self-pay | Admitting: Internal Medicine

## 2018-09-20 NOTE — Telephone Encounter (Signed)
NO answer and Vmail is full Covid-19 Screening Questions  Do you now or have you had a fever in the last 14 days?    Do you have any respiratory symptoms of shortness of breath or cough now or in the last 14 days?     Do you have any family members or close contacts with diagnosed or suspected Covid-19 in the past 14 days?  Have you been tested for Covid-19 and found to be positive?

## 2018-09-21 ENCOUNTER — Other Ambulatory Visit: Payer: Self-pay

## 2018-09-21 ENCOUNTER — Encounter: Payer: Self-pay | Admitting: Internal Medicine

## 2018-09-21 ENCOUNTER — Ambulatory Visit (AMBULATORY_SURGERY_CENTER): Payer: Medicare Other | Admitting: Internal Medicine

## 2018-09-21 VITALS — BP 111/69 | HR 60 | Temp 98.7°F | Resp 12 | Ht 64.0 in | Wt 140.0 lb

## 2018-09-21 DIAGNOSIS — Z86010 Personal history of colonic polyps: Secondary | ICD-10-CM

## 2018-09-21 DIAGNOSIS — I10 Essential (primary) hypertension: Secondary | ICD-10-CM | POA: Diagnosis not present

## 2018-09-21 DIAGNOSIS — I739 Peripheral vascular disease, unspecified: Secondary | ICD-10-CM | POA: Diagnosis not present

## 2018-09-21 DIAGNOSIS — E119 Type 2 diabetes mellitus without complications: Secondary | ICD-10-CM | POA: Diagnosis not present

## 2018-09-21 DIAGNOSIS — D122 Benign neoplasm of ascending colon: Secondary | ICD-10-CM

## 2018-09-21 MED ORDER — SODIUM CHLORIDE 0.9 % IV SOLN: 500.0000 mL | Freq: Once | INTRAVENOUS | Status: DC

## 2018-09-21 NOTE — Progress Notes (Signed)
Called to room to assist during endoscopic procedure.  Patient ID and intended procedure confirmed with present staff. Received instructions for my participation in the procedure from the performing physician.  

## 2018-09-21 NOTE — Op Note (Signed)
[facility] Patient Name: Elizabeth Mcfarland Procedure Date: 09/21/2018 7:12 AM MRN: [id number] Endoscopist: Gatha Mayer , MD Age: 78 Referring MD:  Date of Birth: 1941-02-13 Gender: Female Account #: [id number] Procedure:                Colonoscopy Indications:              Surveillance: Personal history of adenomatous                            polyps on last colonoscopy 5 years ago Medicines:                Propofol per Anesthesia, Monitored Anesthesia Care Procedure:                Pre-Anesthesia Assessment:                           - Prior to the procedure, a History and Physical                            was performed, and patient medications and                            allergies were reviewed. The patient's tolerance of                            previous anesthesia was also reviewed. The risks                            and benefits of the procedure and the sedation                            options and risks were discussed with the patient.                            All questions were answered, and informed consent                            was obtained. Prior Anticoagulants: The patient has                            taken no previous anticoagulant or antiplatelet                            agents. ASA Grade Assessment: II - A patient with                            mild systemic disease. After reviewing the risks                            and benefits, the patient was deemed in                            satisfactory condition to undergo the procedure.  After obtaining informed consent, the colonoscope                            was passed under direct vision. Throughout the                            procedure, the patient's blood pressure, pulse, and                            oxygen saturations were monitored continuously. The                            Colonoscope was introduced through the anus and   advanced to the the cecum, identified by                            appendiceal orifice and ileocecal valve. The                            colonoscopy was performed with difficulty due to a                            tortuous colon. Successful completion of the                            procedure was aided by applying abdominal pressure.                            The patient tolerated the procedure well. The                            quality of the bowel preparation was good. The                            bowel preparation used was Miralax via split dose                            instruction. The ileocecal valve, appendiceal                            orifice, and rectum were photographed. Scope In: 7:45:20 AM Scope Out: 8:08:28 AM Scope Withdrawal Time: 0 hours 8 minutes 29 seconds  Total Procedure Duration: 0 hours 23 minutes 8 seconds  Findings:                 The perianal and digital rectal examinations were                            normal.                           A diminutive polyp was found in the ascending                            colon. The polyp was sessile. The  polyp was removed                            with a cold snare. Resection and retrieval were                            complete. Verification of patient identification                            for the specimen was done. Estimated blood loss was                            minimal.                           The exam was otherwise without abnormality on                            direct and retroflexion views. Complications:            No immediate complications. Estimated Blood Loss:     Estimated blood loss was minimal. Impression:               - One diminutive polyp in the ascending colon,                            removed with a cold snare. Resected and retrieved.                           - The examination was otherwise normal on direct                            and retroflexion views.                            - Personal history of colonic polyps. Recommendation:           - Patient has a contact number available for                            emergencies. The signs and symptoms of potential                            delayed complications were discussed with the                            patient. Return to normal activities tomorrow.                            Written discharge instructions were provided to the                            patient.                           - Resume previous diet.                           -  Continue present medications.                           - No repeat colonoscopy due to current age (49                            years or older). Gatha Mayer, MD 09/21/2018 8:18:27 AM This report has been signed electronically.

## 2018-09-21 NOTE — Progress Notes (Signed)
Pt's states no medical or surgical changes since previsit or office visit.  [place]

## 2018-09-21 NOTE — Progress Notes (Signed)
Report to PACU, RN, vss, BBS= Clear.  

## 2018-09-21 NOTE — Patient Instructions (Addendum)
I found and removed one tiny polyp. It does not look like cancer - it will be analyzed.  I do not think you will need another routine colonoscopy.  I appreciate the opportunity to care for you. Gatha Mayer, MD, FACG YOU HAD AN ENDOSCOPIC PROCEDURE TODAY AT [facility]:   Refer to the procedure report that was given to you for any specific questions about what was found during the examination.  If the procedure report does not answer your questions, please call your gastroenterologist to clarify.  If you requested that your care partner not be given the details of your procedure findings, then the procedure report has been included in a sealed envelope for you to review at your convenience later.  YOU SHOULD EXPECT: Some feelings of bloating in the abdomen. Passage of more gas than usual.  Walking can help get rid of the air that was put into your GI tract during the procedure and reduce the bloating. If you had a lower endoscopy (such as a colonoscopy or flexible sigmoidoscopy) you may notice spotting of blood in your stool or on the toilet paper. If you underwent a bowel prep for your procedure, you may not have a normal bowel movement for a few days.  Please Note:  You might notice some irritation and congestion in your nose or some drainage.  This is from the oxygen used during your procedure.  There is no need for concern and it should clear up in a day or so.  SYMPTOMS TO REPORT IMMEDIATELY:   Following lower endoscopy (colonoscopy or flexible sigmoidoscopy):  Excessive amounts of blood in the stool  Significant tenderness or worsening of abdominal pains  Swelling of the abdomen that is new, acute  Fever of 100F or higher  For urgent or emergent issues, a gastroenterologist can be reached at any hour by calling [phone].   DIET:  We do recommend a small meal at first, but then you may proceed to your regular diet.  Drink plenty of fluids but you  should avoid alcoholic beverages for 24 hours.  ACTIVITY:  You should plan to take it easy for the rest of today and you should NOT DRIVE or use heavy machinery until tomorrow (because of the sedation medicines used during the test).    FOLLOW UP: Our staff will call the number listed on your records 48-72 hours following your procedure to check on you and address any questions or concerns that you may have regarding the information given to you following your procedure. If we do not reach you, we will leave a message.  We will attempt to reach you two times.  During this call, we will ask if you have developed any symptoms of COVID 19. If you develop any symptoms (ie: fever, flu-like symptoms, shortness of breath, cough etc.) before then, please call [phone].  If you test positive for Covid 19 in the 2 weeks post procedure, please call and report this information to [place].    If any biopsies were taken you will be contacted by phone or by letter within the next 1-3 weeks.  Please call us at [phone] if you have not heard about the biopsies in 3 weeks.     SIGNATURES/CONFIDENTIALITY: You and/or your care partner have signed paperwork which will be entered into your electronic medical record.  These signatures attest to the fact that that the information above on your After Visit Summary has been reviewed and is  understood.  Full responsibility of the confidentiality of this discharge information lies with you and/or your care-partner. 

## 2018-09-23 ENCOUNTER — Telehealth: Payer: Self-pay

## 2018-09-23 NOTE — Telephone Encounter (Signed)
Follow up call made, name identifier.Left a voicemail.

## 2018-09-23 NOTE — Telephone Encounter (Signed)
Second post procedure follow up call, no answer 

## 2018-09-25 ENCOUNTER — Encounter: Payer: Self-pay | Admitting: Internal Medicine

## 2018-09-25 DIAGNOSIS — Z86010 Personal history of colonic polyps: Secondary | ICD-10-CM

## 2018-10-01 ENCOUNTER — Other Ambulatory Visit: Payer: Self-pay

## 2018-10-01 ENCOUNTER — Ambulatory Visit
Admission: RE | Admit: 2018-10-01 | Discharge: 2018-10-01 | Disposition: A | Discharge status: Home / Self Care | Payer: Medicare Other | Source: Ambulatory Visit | Attending: Nurse Practitioner | Admitting: Nurse Practitioner

## 2018-10-01 DIAGNOSIS — Z13820 Encounter for screening for osteoporosis: Secondary | ICD-10-CM | POA: Diagnosis not present

## 2018-10-01 DIAGNOSIS — Z1231 Encounter for screening mammogram for malignant neoplasm of breast: Secondary | ICD-10-CM | POA: Diagnosis not present

## 2018-10-01 DIAGNOSIS — E2839 Other primary ovarian failure: Secondary | ICD-10-CM

## 2018-10-01 DIAGNOSIS — Z780 Asymptomatic menopausal state: Secondary | ICD-10-CM | POA: Diagnosis not present

## 2018-10-04 ENCOUNTER — Other Ambulatory Visit: Payer: Self-pay | Admitting: Nurse Practitioner

## 2018-10-04 DIAGNOSIS — R928 Other abnormal and inconclusive findings on diagnostic imaging of breast: Secondary | ICD-10-CM

## 2018-10-05 ENCOUNTER — Ambulatory Visit
Admission: RE | Admit: 2018-10-05 | Discharge: 2018-10-05 | Disposition: A | Discharge status: Home / Self Care | Payer: Medicare Other | Source: Ambulatory Visit | Attending: Nurse Practitioner | Admitting: Nurse Practitioner

## 2018-10-05 ENCOUNTER — Other Ambulatory Visit: Payer: Self-pay

## 2018-10-05 ENCOUNTER — Other Ambulatory Visit: Payer: Self-pay | Admitting: Nurse Practitioner

## 2018-10-05 DIAGNOSIS — R928 Other abnormal and inconclusive findings on diagnostic imaging of breast: Secondary | ICD-10-CM

## 2018-10-05 DIAGNOSIS — R922 Inconclusive mammogram: Secondary | ICD-10-CM | POA: Diagnosis not present

## 2018-10-05 DIAGNOSIS — N6322 Unspecified lump in the left breast, upper inner quadrant: Secondary | ICD-10-CM | POA: Diagnosis not present

## 2018-10-05 DIAGNOSIS — N6312 Unspecified lump in the right breast, upper inner quadrant: Secondary | ICD-10-CM | POA: Diagnosis not present

## 2018-10-05 DIAGNOSIS — N6320 Unspecified lump in the left breast, unspecified quadrant: Secondary | ICD-10-CM

## 2018-10-07 ENCOUNTER — Ambulatory Visit (INDEPENDENT_AMBULATORY_CARE_PROVIDER_SITE_OTHER): Payer: Medicare Other | Admitting: Nurse Practitioner

## 2018-10-07 ENCOUNTER — Ambulatory Visit
Admission: RE | Admit: 2018-10-07 | Discharge: 2018-10-07 | Disposition: A | Discharge status: Home / Self Care | Payer: Medicare Other | Source: Ambulatory Visit | Attending: Nurse Practitioner | Admitting: Nurse Practitioner

## 2018-10-07 ENCOUNTER — Encounter: Payer: Self-pay | Admitting: Nurse Practitioner

## 2018-10-07 ENCOUNTER — Other Ambulatory Visit: Payer: Self-pay

## 2018-10-07 VITALS — BP 136/74 | HR 78 | Temp 98.3°F | Ht 62.1 in | Wt 137.8 lb

## 2018-10-07 DIAGNOSIS — N6322 Unspecified lump in the left breast, upper inner quadrant: Secondary | ICD-10-CM | POA: Diagnosis not present

## 2018-10-07 DIAGNOSIS — D242 Benign neoplasm of left breast: Secondary | ICD-10-CM | POA: Diagnosis not present

## 2018-10-07 DIAGNOSIS — E782 Mixed hyperlipidemia: Secondary | ICD-10-CM | POA: Diagnosis not present

## 2018-10-07 DIAGNOSIS — C50211 Malignant neoplasm of upper-inner quadrant of right female breast: Secondary | ICD-10-CM | POA: Diagnosis not present

## 2018-10-07 DIAGNOSIS — K219 Gastro-esophageal reflux disease without esophagitis: Secondary | ICD-10-CM

## 2018-10-07 DIAGNOSIS — R928 Other abnormal and inconclusive findings on diagnostic imaging of breast: Secondary | ICD-10-CM

## 2018-10-07 DIAGNOSIS — IMO0002 Reserved for concepts with insufficient information to code with codable children: Secondary | ICD-10-CM

## 2018-10-07 DIAGNOSIS — E1165 Type 2 diabetes mellitus with hyperglycemia: Secondary | ICD-10-CM | POA: Diagnosis not present

## 2018-10-07 DIAGNOSIS — Z170 Estrogen receptor positive status [ER+]: Secondary | ICD-10-CM | POA: Diagnosis not present

## 2018-10-07 DIAGNOSIS — I10 Essential (primary) hypertension: Secondary | ICD-10-CM | POA: Diagnosis not present

## 2018-10-07 DIAGNOSIS — N63 Unspecified lump in unspecified breast: Secondary | ICD-10-CM | POA: Insufficient documentation

## 2018-10-07 DIAGNOSIS — R21 Rash and other nonspecific skin eruption: Secondary | ICD-10-CM | POA: Insufficient documentation

## 2018-10-07 DIAGNOSIS — E118 Type 2 diabetes mellitus with unspecified complications: Secondary | ICD-10-CM | POA: Diagnosis not present

## 2018-10-07 NOTE — Progress Notes (Addendum)
Subjective:     Patient ID: Elizabeth Mcfarland , female    DOB: 13-Apr-1940 , 78 y.o.   MRN: [id number]   Chief Complaint  Patient presents with  . Diabetes  . Hypertension    HPI  She is scheduled for biopsy today at 1:45pm on both breast due to an abnormal mammogram.  No problems with her breast in the past. No family history of breast cancer.    Denies any pain.    She went to a surgeon to have removed but they do not want to remove.  Left knee area has lump present to medial aspect of leg.  Would like to have removed.    Diabetes She presents for her follow-up diabetic visit. She has type 2 diabetes mellitus. Her disease course has been stable. There are no hypoglycemic associated symptoms. Pertinent negatives for hypoglycemia include no dizziness or headaches. There are no diabetic associated symptoms. Pertinent negatives for diabetes include no chest pain. There are no hypoglycemic complications. There are no diabetic complications. There are no known risk factors for coronary artery disease. (Reports having been running good at home. )  Hypertension Pertinent negatives include no chest pain, headaches or palpitations.     Past Medical History:  Diagnosis Date  . Adenomatous colon polyp   . Allergy   . Anemia   . Blood transfusion without reported diagnosis   . Cataract   . Chronic kidney disease   . Diabetes mellitus without complication ([place])   . Esophageal ulcer with bleeding 05/05/2013   05/05/2013 EGD linear distal esophageal ulcer - cause of hematemesis prior to EGD   . GERD (gastroesophageal reflux disease)   . Hyperlipidemia   . Hypertension      Family History  Problem Relation Age of Onset  . Diabetes Sister   . Kidney failure Sister   . Colon polyps Sister   . CAD Maternal Grandmother   . Early death Mother   . Stroke Father   . Stroke Brother   . Colon polyps Brother   . Heart disease Brother   . Emphysema Brother   . Asthma Brother   . Breast cancer Neg Hx    . Colon cancer Neg Hx   . Esophageal cancer Neg Hx   . Rectal cancer Neg Hx   . Stomach cancer Neg Hx      Current Outpatient Medications:  .  acetaminophen (TYLENOL) 500 MG tablet, Take 1,000 mg by mouth daily as needed for headache (pain)., Disp: , Rfl:  .  amLODipine (NORVASC) 10 MG tablet, TAKE 1 TABLET BY MOUTH EVERY DAY, Disp: 90 tablet, Rfl: 1 .  Ascorbic Acid (VITAMIN C) 1000 MG tablet, Take 1,000 mg by mouth daily., Disp: , Rfl:  .  aspirin EC 81 MG tablet, Take 81 mg by mouth daily., Disp: , Rfl:  .  cholecalciferol (VITAMIN D) 1000 UNITS tablet, Take 1,000 Units by mouth daily. , Disp: , Rfl:  .  glimepiride (AMARYL) 4 MG tablet, TAKE 1 TABLET BY MOUTH TWICE DAILY, Disp: 180 tablet, Rfl: 0 .  JANUMET 50-500 MG tablet, TAKE 1 TABLET BY MOUTH EVERY DAY WITH MEALS, Disp: 30 tablet, Rfl: 2 .  LANSOPRAZOLE PO, Take by mouth., Disp: , Rfl:  .  Multiple Vitamin (MULTIVITAMIN WITH MINERALS) TABS tablet, Take 1 tablet by mouth daily. Centrum Silver, Disp: , Rfl:  .  Polyethyl Glycol-Propyl Glycol (SYSTANE OP), Place 1 drop into both eyes daily as needed (dry eyes). , Disp: , Rfl:  .  polyethylene glycol (MIRALAX / GLYCOLAX) packet, Take 17 g by mouth daily. Mix in 8 oz liquid and drink, Disp: , Rfl:    Allergies  Allergen Reactions  . Codeine Nausea And Vomiting  . Percocet [Oxycodone-Acetaminophen] Nausea And Vomiting     Review of Systems  Constitutional: Negative.   Respiratory: Negative.   Cardiovascular: Negative.  Negative for chest pain, palpitations and leg swelling.  Musculoskeletal: Negative.   Skin:       Right groin area with a "pimple" for the last year.    Neurological: Negative for dizziness and headaches.  Psychiatric/Behavioral: Negative.      Today's Vitals   10/07/18 1042  BP: 136/74  Pulse: 78  Temp: 98.3 F (36.8 C)  TempSrc: Oral  Weight: 137 lb 12.8 oz (62.5 kg)  Height: 5' 2.1" (1.577 m)  PainSc: 0-No pain   Body mass index is 25.12 kg/m.    Objective:  Physical Exam Vitals signs reviewed.  Constitutional:      General: She is not in acute distress.    Appearance: Normal appearance.  Cardiovascular:     Rate and Rhythm: Normal rate and regular rhythm.     Pulses: Normal pulses.     Heart sounds: Normal heart sounds. No murmur.  Pulmonary:     Effort: Pulmonary effort is normal. No respiratory distress.     Breath sounds: Normal breath sounds.  Skin:    General: Skin is warm and dry.     Capillary Refill: Capillary refill takes less than 2 seconds.     Findings: Rash present.  Neurological:     General: No focal deficit present.     Mental Status: She is alert and oriented to person, place, and time.  Psychiatric:        Mood and Affect: Mood normal.        Behavior: Behavior normal.        Thought Content: Thought content normal.        Judgment: Judgment normal.         Assessment And Plan:     1. Essential hypertension . B/P is controlled.  . CMP ordered to check renal function.   2. Type II diabetes mellitus with manifestations, uncontrolled (HCC) Chronic, stable Continue with current medications Encouraged to limit intake of sugary foods and drinks Encouraged to increase physical activity to 150 minutes per week  3. Gastroesophageal reflux disease without esophagitis  Chronic, stable  4. Breast mass seen on mammogram  She is to have a biopsy on both breast today  Mass found on routine mammogram  5. Localized skin eruption Right groin with no redness, advised if worsens call to office  [name], FNP    THE PATIENT IS ENCOURAGED TO PRACTICE SOCIAL DISTANCING DUE TO THE COVID-19 PANDEMIC.

## 2018-10-08 ENCOUNTER — Telehealth: Payer: Self-pay | Admitting: Hematology

## 2018-10-08 ENCOUNTER — Encounter: Payer: Self-pay | Admitting: *Deleted

## 2018-10-08 LAB — CBC
Hematocrit: 35.5 % (ref 34.0–46.6)
Hemoglobin: 11.5 g/dL (ref 11.1–15.9)
MCH: 29.5 pg (ref 26.6–33.0)
MCHC: 32.4 g/dL (ref 31.5–35.7)
MCV: 91 fL (ref 79–97)
Platelets: 252 10*3/uL (ref 150–450)
RBC: 3.9 x10E6/uL (ref 3.77–5.28)
RDW: 11.8 % (ref 11.7–15.4)
WBC: 6.9 10*3/uL (ref 3.4–10.8)

## 2018-10-08 LAB — LIPID PANEL
Chol/HDL Ratio: 2.3 ratio (ref 0.0–4.4)
Cholesterol, Total: 234 mg/dL — ABNORMAL HIGH (ref 100–199)
HDL: 103 mg/dL (ref >39)
LDL Calculated: 120 mg/dL — ABNORMAL HIGH (ref 0–99)
Triglycerides: 53 mg/dL (ref 0–149)
VLDL Cholesterol Cal: 11 mg/dL (ref 5–40)

## 2018-10-08 LAB — BMP8+EGFR
BUN/Creatinine Ratio: 14 (ref 12–28)
BUN: 10 mg/dL (ref 8–27)
CO2: 26 mmol/L (ref 20–29)
Calcium: 9.6 mg/dL (ref 8.7–10.3)
Chloride: 101 mmol/L (ref 96–106)
Creatinine, Ser: 0.73 mg/dL (ref 0.57–1.00)
GFR calc Af Amer: 93 mL/min/{1.73_m2} (ref >59)
GFR calc non Af Amer: 80 mL/min/{1.73_m2} (ref >59)
Glucose: 160 mg/dL — ABNORMAL HIGH (ref 65–99)
Potassium: 4.3 mmol/L (ref 3.5–5.2)
Sodium: 140 mmol/L (ref 134–144)

## 2018-10-08 LAB — HEMOGLOBIN A1C
Est. average glucose Bld gHb Est-mCnc: 146 mg/dL
Hgb A1c MFr Bld: 6.7 % — ABNORMAL HIGH (ref 4.8–5.6)

## 2018-10-08 NOTE — Telephone Encounter (Signed)
LVM for upcoming [facility] appointment for 7/22, advised packet will be mailed and to return call to confirm

## 2018-10-11 ENCOUNTER — Other Ambulatory Visit: Payer: Self-pay | Admitting: *Deleted

## 2018-10-11 DIAGNOSIS — Z170 Estrogen receptor positive status [ER+]: Secondary | ICD-10-CM

## 2018-10-11 DIAGNOSIS — C50211 Malignant neoplasm of upper-inner quadrant of right female breast: Secondary | ICD-10-CM

## 2018-10-13 ENCOUNTER — Inpatient Hospital Stay: Payer: Medicare Other

## 2018-10-13 ENCOUNTER — Ambulatory Visit: Payer: Self-pay | Admitting: General Surgery

## 2018-10-13 ENCOUNTER — Ambulatory Visit
Admission: RE | Admit: 2018-10-13 | Discharge: 2018-10-13 | Disposition: A | Discharge status: Home / Self Care | Payer: Medicare Other | Source: Ambulatory Visit | Attending: Radiation Oncology | Admitting: Radiation Oncology

## 2018-10-13 ENCOUNTER — Other Ambulatory Visit: Payer: Self-pay

## 2018-10-13 ENCOUNTER — Encounter: Payer: Self-pay | Admitting: Hematology

## 2018-10-13 ENCOUNTER — Inpatient Hospital Stay: Payer: Medicare Other | Attending: Hematology | Admitting: Hematology

## 2018-10-13 VITALS — BP 162/80 | HR 89 | Temp 97.3°F | Resp 17 | Ht 62.1 in | Wt 136.0 lb

## 2018-10-13 DIAGNOSIS — Z8042 Family history of malignant neoplasm of prostate: Secondary | ICD-10-CM | POA: Diagnosis not present

## 2018-10-13 DIAGNOSIS — K219 Gastro-esophageal reflux disease without esophagitis: Secondary | ICD-10-CM

## 2018-10-13 DIAGNOSIS — C50211 Malignant neoplasm of upper-inner quadrant of right female breast: Secondary | ICD-10-CM

## 2018-10-13 DIAGNOSIS — Z8371 Family history of colonic polyps: Secondary | ICD-10-CM | POA: Insufficient documentation

## 2018-10-13 DIAGNOSIS — Z825 Family history of asthma and other chronic lower respiratory diseases: Secondary | ICD-10-CM | POA: Diagnosis not present

## 2018-10-13 DIAGNOSIS — Z79899 Other long term (current) drug therapy: Secondary | ICD-10-CM | POA: Insufficient documentation

## 2018-10-13 DIAGNOSIS — D869 Sarcoidosis, unspecified: Secondary | ICD-10-CM | POA: Diagnosis not present

## 2018-10-13 DIAGNOSIS — Z823 Family history of stroke: Secondary | ICD-10-CM | POA: Insufficient documentation

## 2018-10-13 DIAGNOSIS — Z841 Family history of disorders of kidney and ureter: Secondary | ICD-10-CM | POA: Insufficient documentation

## 2018-10-13 DIAGNOSIS — Z170 Estrogen receptor positive status [ER+]: Secondary | ICD-10-CM

## 2018-10-13 DIAGNOSIS — M1990 Unspecified osteoarthritis, unspecified site: Secondary | ICD-10-CM | POA: Insufficient documentation

## 2018-10-13 DIAGNOSIS — Z8719 Personal history of other diseases of the digestive system: Secondary | ICD-10-CM | POA: Insufficient documentation

## 2018-10-13 DIAGNOSIS — I10 Essential (primary) hypertension: Secondary | ICD-10-CM | POA: Insufficient documentation

## 2018-10-13 DIAGNOSIS — Z8249 Family history of ischemic heart disease and other diseases of the circulatory system: Secondary | ICD-10-CM | POA: Diagnosis not present

## 2018-10-13 DIAGNOSIS — Z833 Family history of diabetes mellitus: Secondary | ICD-10-CM | POA: Diagnosis not present

## 2018-10-13 DIAGNOSIS — Z885 Allergy status to narcotic agent status: Secondary | ICD-10-CM | POA: Diagnosis not present

## 2018-10-13 DIAGNOSIS — E119 Type 2 diabetes mellitus without complications: Secondary | ICD-10-CM | POA: Diagnosis not present

## 2018-10-13 DIAGNOSIS — C50411 Malignant neoplasm of upper-outer quadrant of right female breast: Secondary | ICD-10-CM | POA: Diagnosis not present

## 2018-10-13 LAB — CBC WITH DIFFERENTIAL (CANCER CENTER ONLY)
Abs Immature Granulocytes: 0.02 10*3/uL (ref 0.00–0.07)
Basophils Absolute: 0 10*3/uL (ref 0.0–0.1)
Basophils Relative: 1 %
Eosinophils Absolute: 0 10*3/uL (ref 0.0–0.5)
Eosinophils Relative: 1 %
HCT: 36.7 % (ref 36.0–46.0)
Hemoglobin: 11.8 g/dL — ABNORMAL LOW (ref 12.0–15.0)
Immature Granulocytes: 0 %
Lymphocytes Relative: 24 %
Lymphs Abs: 1.5 10*3/uL (ref 0.7–4.0)
MCH: 29.4 pg (ref 26.0–34.0)
MCHC: 32.2 g/dL (ref 30.0–36.0)
MCV: 91.5 fL (ref 80.0–100.0)
Monocytes Absolute: 0.4 10*3/uL (ref 0.1–1.0)
Monocytes Relative: 7 %
Neutro Abs: 4.3 10*3/uL (ref 1.7–7.7)
Neutrophils Relative %: 67 %
Platelet Count: 282 10*3/uL (ref 150–400)
RBC: 4.01 MIL/uL (ref 3.87–5.11)
RDW: 12.1 % (ref 11.5–15.5)
WBC Count: 6.3 10*3/uL (ref 4.0–10.5)
nRBC: 0 % (ref 0.0–0.2)

## 2018-10-13 LAB — CMP (CANCER CENTER ONLY)
ALT: 20 U/L (ref 0–44)
AST: 25 U/L (ref 15–41)
Albumin: 4.3 g/dL (ref 3.5–5.0)
Alkaline Phosphatase: 74 U/L (ref 38–126)
Anion gap: 12 (ref 5–15)
BUN: 12 mg/dL (ref 8–23)
CO2: 31 mmol/L (ref 22–32)
Calcium: 11.1 mg/dL — ABNORMAL HIGH (ref 8.9–10.3)
Chloride: 100 mmol/L (ref 98–111)
Creatinine: 0.99 mg/dL (ref 0.44–1.00)
GFR, Est AFR Am: >60 mL/min (ref >60)
GFR, Estimated: 55 mL/min — ABNORMAL LOW (ref >60)
Glucose, Bld: 121 mg/dL — ABNORMAL HIGH (ref 70–99)
Potassium: 3.7 mmol/L (ref 3.5–5.1)
Sodium: 143 mmol/L (ref 135–145)
Total Bilirubin: 0.6 mg/dL (ref 0.3–1.2)
Total Protein: 7.5 g/dL (ref 6.5–8.1)

## 2018-10-13 NOTE — Progress Notes (Signed)
[place]   [phone] Fax:([phone]) [place] Note   Patient Care Team: [name], [place] as PCP - General (General Practice) [name], RN as Oncology Nurse Navigator [name], RN as Oncology Nurse Navigator [name], MD as Consulting Physician (Hematology) [name], MD as Consulting Physician (General Surgery) [name], MD as Consulting Physician (Radiation Oncology)  Date of Service:  10/13/2018   CHIEF COMPLAINTS/PURPOSE OF CONSULTATION:  Malignant neoplasm of upper-inner quadrant of right breast    Oncology History  Malignant neoplasm of upper-inner quadrant of right breast in female, estrogen receptor positive ([place])  10/01/2018 Imaging   Bone Density Scan 10/01/18  IMPRESSION Lowest T-score of -0.9 at left femur neck (NORMAL) T-score DualFemur Neck Left  10/01/2018    [age]         -0.9    0.916 g/cm2   AP Spine  L1-L4      10/01/2018    [age]         1.2     1.326 g/cm2   DualFemur Total Mean 10/01/2018    [age]         0.1     1.024 g/cm2   10/05/2018 Mammogram   Diagnostic Mammogram 10/05/18 IMPRESSION: 1. Highly suspicious right breast mass 1 o'clock position 2 cm from the nipple on the right. It measures 2.1 x 1.7 x 1.5 cm. Corresponding with the screening mammographic findings. Recommendation is for ultrasound-guided biopsy. 2. Indeterminate left breast mass at the 10 o'clock position 9 cm from the nipple. It measures 1.3 x 0.5 x 0.3 cm. This may represent a benign etiology such as a degenerating fibroadenoma. However, ultrasound-guided biopsy for definitive tissue diagnosis is recommended. 3. No suspicious lymphadenopathy bilaterally.   10/07/2018 Cancer Staging   Staging form: Breast, AJCC 8th Edition - Clinical stage from 10/07/2018: cT2, cN0, cM0, GX, ER+, PR+, HER2: Equivocal - Signed by [name], MD on 10/12/2018   10/07/2018 Initial Biopsy   Diagnosis 10/07/18 1. Breast, right, needle  core biopsy, 1 o'clock, 2cmfn - INVASIVE MAMMARY CARCINOMA WITH CALCIFICATIONS. SEE NOTE 2. Breast, left, needle core biopsy, 10 o'clock - FIBROADENOMA WITH DYSTROPHIC CALCIFICATIONS - NEGATIVE FOR CARCINOMA   10/07/2018 Receptors her2   By immunohistochemistry, the tumor cells are EQUIVOCAL for Her2 (2+). HER2 by FISH will be PERFORMED and the RESULTS REPORTED SEPARATELY Estrogen Receptor: 100%, POSITIVE, STRONG STAINING INTENSITY Progesterone Receptor: 70%, POSITIVE, STRONG STAINING INTENSITY Proliferation Marker Ki67: 10%   10/11/2018 Initial Diagnosis   Malignant neoplasm of upper-inner quadrant of right breast in female, estrogen receptor positive ([place])      HISTORY OF PRESENTING ILLNESS:  Elizabeth Mcfarland 78 y.o. female is a here because of newly diagnosed right breast cancer. The patient presents to the Breast clinic today alone.  This was discovered by mammogram. She did not feel the mass herself. She has not had abnormal mammograms in the past. She denies any other significant pain. She denies any other issues with her breast. Today the patient notes recurrent kidney stones and joint arthritis. She notes she has been taking 5 tabs of 1000 mg of calcium and vitamin.   Socially she is single and retired. She had only 1 daughter. She lives alone and take care of herself and property well. She is overall active with  No limitations.  They have a PMHx of DM and HTN. She was treated for sarcoidosis over 50 years ago. She had a hysterectomy 30  years ago due to severe cramping. She had esophogeal ulcer bleeding. She had colon polyps removed from last colonoscopy in 09/21/18.  2 of her brothers had prostate cancer both in their 21s.    GYN HISTORY  Menarchal: 17 LMP: Hysterectomy 30 years ago (1990), overlies not removed.  Contraceptive: Yes HRT: No  G1P1: First at age 71    REVIEW OF SYSTEMS:    Constitutional: Denies fevers, chills or abnormal night sweats Eyes: Denies blurriness  of vision, double vision or watery eyes Ears, nose, mouth, throat, and face: Denies mucositis or sore throat Respiratory: Denies cough, dyspnea or wheezes Cardiovascular: Denies palpitation, chest discomfort or lower extremity swelling Gastrointestinal:  Denies nausea, heartburn or change in bowel habits Skin: Denies abnormal skin rashes MSK: (+) arthritis pain Lymphatics: Denies new lymphadenopathy or easy bruising Neurological:Denies numbness, tingling or new weaknesses Behavioral/Psych: Mood is stable, no new changes  All other systems were reviewed with the patient and are negative.   MEDICAL HISTORY:  Past Medical History:  Diagnosis Date   Allergy    Anemia    Blood transfusion without reported diagnosis    Cataract    Chronic kidney disease    Diabetes mellitus without complication ([place])    Esophageal ulcer with bleeding 05/05/2013   05/05/2013 EGD linear distal esophageal ulcer - cause of hematemesis prior to EGD    GERD (gastroesophageal reflux disease)    Hyperlipidemia    Hypertension    Sarcoidosis 1970    SURGICAL HISTORY: Past Surgical History:  Procedure Laterality Date   ABDOMINAL HYSTERECTOMY     COLONOSCOPY     COLONOSCOPY, ESOPHAGOGASTRODUODENOSCOPY (EGD) AND ESOPHAGEAL DILATION N/A 05/05/2013   Procedure: COLONOSCOPY, ESOPHAGOGASTRODUODENOSCOPY (EGD) AND ESOPHAGEAL DILATION (ED);  Surgeon: Gatha Mayer, MD;  Location: [facility];  Service: Endoscopy;  Laterality: N/A;   LITHOTRIPSY     POLYPECTOMY     UPPER GASTROINTESTINAL ENDOSCOPY      SOCIAL HISTORY: Social History   Socioeconomic History   Marital status: Single    Spouse name: Not on file   Number of children: 1   Years of education: Not on file   Highest education level: Not on file  Occupational History   Occupation: Physiological scientist: SELF-EMPLOYED   Occupation: retired  Scientist, product/process development strain: Not hard at [employer] insecurity     Worry: Never true    Inability: Never true   Transportation needs    Medical: No    Non-medical: No  Tobacco Use   Smoking status: Never Smoker   Smokeless tobacco: Never Used  Substance and Sexual Activity   Alcohol use: No   Drug use: No   Sexual activity: Not Currently  Lifestyle   Physical activity    Days per week: 0 days    Minutes per session: 0 min   Stress: Not at all  Relationships   Social connections    Talks on phone: Not on file    Gets together: Not on file    Attends religious service: Not on file    Active member of club or organization: Not on file    Attends meetings of clubs or organizations: Not on file    Relationship status: Not on file   Intimate partner violence    Fear of current or ex partner: Not on file    Emotionally abused: Not on file    Physically abused: Not on file    Forced sexual  activity: Not on file  Other Topics Concern   Not on file  Social History Narrative   Not on file    FAMILY HISTORY: Family History  Problem Relation Age of Onset   Diabetes Sister    Kidney failure Sister    Colon polyps Sister    CAD Maternal Grandmother    Early death Mother    Stroke Father    Stroke Brother    Colon polyps Brother    Cancer Brother 37       prostate cancer   Heart disease Brother    Emphysema Brother    Cancer Brother 60       prostate cancer   Asthma Brother    Breast cancer Neg Hx    Colon cancer Neg Hx    Esophageal cancer Neg Hx    Rectal cancer Neg Hx    Stomach cancer Neg Hx     ALLERGIES:  is allergic to codeine and percocet [oxycodone-acetaminophen].  MEDICATIONS:  Current Outpatient Medications  Medication Sig Dispense Refill   acetaminophen (TYLENOL) 500 MG tablet Take 1,000 mg by mouth daily as needed for headache (pain).     amLODipine (NORVASC) 10 MG tablet TAKE 1 TABLET BY MOUTH EVERY DAY 90 tablet 1   aspirin EC 81 MG tablet Take 81 mg by mouth daily.      cholecalciferol (VITAMIN D) 1000 UNITS tablet Take 3,000 Units by mouth daily.      glimepiride (AMARYL) 4 MG tablet TAKE 1 TABLET BY MOUTH TWICE DAILY 180 tablet 0   JANUMET 50-500 MG tablet TAKE 1 TABLET BY MOUTH EVERY DAY WITH MEALS 30 tablet 2   LANSOPRAZOLE PO Take by mouth.     Multiple Vitamin (MULTIVITAMIN WITH MINERALS) TABS tablet Take 1 tablet by mouth daily. Centrum Silver     Polyethyl Glycol-Propyl Glycol (SYSTANE OP) Place 1 drop into both eyes daily as needed (dry eyes).      polyethylene glycol (MIRALAX / GLYCOLAX) packet Take 17 g by mouth daily. Mix in 8 oz liquid and drink     Ascorbic Acid (VITAMIN C) 1000 MG tablet Take 1,000 mg by mouth daily.     No current facility-administered medications for this visit.     PHYSICAL EXAMINATION: ECOG PERFORMANCE STATUS: 1 - Symptomatic but completely ambulatory  Vitals:   10/13/18 1303  BP: (!) 162/80  Pulse: 89  Resp: 17  Temp: (!) 97.3 F (36.3 C)  SpO2: 100%   Filed Weights   10/13/18 1303  Weight: 136 lb (61.7 kg)    GENERAL:alert, no distress and comfortable SKIN: skin color, texture, turgor are normal, no rashes or significant lesions EYES: normal, Conjunctiva are pink and non-injected, sclera clear  NECK: supple, thyroid normal size, non-tender, without nodularity LYMPH:  no palpable lymphadenopathy in the cervical, axillary  LUNGS: clear to auscultation and percussion with normal breathing effort HEART: regular rate & rhythm and no murmurs and no lower extremity edema ABDOMEN:abdomen soft, non-tender and normal bowel sounds Musculoskeletal:no cyanosis of digits and no clubbing  NEURO: alert & oriented x 3 with fluent speech, no focal motor/sensory deficits BREAST: (+) 0.5cm lump at 11-12:00 position of right breast, at biopsy site. (+) no lump at left breast biopsy site. No adenopathy bilaterally. Left Breast exam benign  LABORATORY DATA:  I have reviewed the data as listed CBC Latest Ref Rng &  Units 10/13/2018 10/07/2018 07/08/2018  WBC 4.0 - 10.5 K/uL 6.3 6.9 5.6  Hemoglobin 12.0 - 15.0  g/dL 11.8(L) 11.5 12.4  Hematocrit 36.0 - 46.0 % 36.7 35.5 37.6  Platelets 150 - 400 K/uL 282 252 283    CMP Latest Ref Rng & Units 10/13/2018 10/07/2018 07/08/2018  Glucose 70 - 99 mg/dL 121(H) 160(H) 127(H)  BUN 8 - 23 mg/dL _0 Creatinine 0.44 - 1.00 mg/dL 0.99 0.73 0.67  Sodium 135 - 145 mmol/L 143 140 139  Potassium 3.5 - 5.1 mmol/L 3.7 4.3 4.3  Chloride 98 - 111 mmol/L 100 101 100  CO2 22 - 32 mmol/L _1 Calcium 8.9 - 10.3 mg/dL 11.1(H) 9.6 10.5(H)  Total Protein 6.5 - 8.1 g/dL 7.5 - -  Total Bilirubin 0.3 - 1.2 mg/dL 0.6 - -  Alkaline Phos 38 - 126 U/L 74 - -  AST 15 - 41 U/L 25 - -  ALT 0 - 44 U/L 20 - -     RADIOGRAPHIC STUDIES: I have personally reviewed the radiological images as listed and agreed with the findings in the report. Dg Bone Density (dxa)  Result Date: 10/01/2018 EXAM: DUAL X-RAY ABSORPTIOMETRY (DXA) FOR BONE MINERAL DENSITY IMPRESSION: Referring Physician:  [name] Your patient completed a BMD test using Lunar IDXA DXA system ( analysis version: 16 ) manufactured by [employer]. Technologist: AW PATIENT: Name: Elizabeth Mcfarland, Elizabeth Mcfarland Patient ID: [id number] Birth Date: 08-14-1940 Height: 64.0 in. Sex: Female Measured: 10/01/2018 Weight: 137.6 lbs. Indications: Advanced Age, Estrogen Deficient, Postmenopausal Fractures: None Treatments: Vitamin D (E933.5) ASSESSMENT: The BMD measured at Femur Neck Left is 0.916 g/cm2 with a T-score of -0.9. This patient is considered normal according to [facility]) criteria. The scan quality is good. Site Region Measured Date Measured Age YA BMD Significant CHANGE T-score DualFemur Neck Left  10/01/2018    [age]         -0.9    0.916 g/cm2 AP Spine  L1-L4      10/01/2018    [age]         1.2     1.326 g/cm2 DualFemur Total Mean 10/01/2018    [age]         0.1     1.024 g/cm2 World Health Organization [facility]) criteria for  post-menopausal, Caucasian Women: Normal       T-score at or above -1 SD Osteopenia   T-score between -1 and -2.5 SD Osteoporosis T-score at or below -2.5 SD RECOMMENDATION: 1. All patients should optimize calcium and vitamin D intake. 2. Consider FDA approved medical therapies in postmenopausal women and men aged 91 years and older, based on the following: a. A hip or vertebral (clinical or morphometric) fracture b. T- score < or = -2.5 at the femoral neck or spine after appropriate evaluation to exclude secondary causes c. Low bone mass (T-score between -1.0 and -2.5 at the femoral neck or spine) and a 10 year probability of a hip fracture > or = 3% or a 10 year probability of a major osteoporosis-related fracture > or = 20% based on the [place]-adapted WHO algorithm d. Clinician judgment and/or patient preferences may indicate treatment for people with 10-year fracture probabilities above or below these levels FOLLOW-UP: Patients with diagnosis of osteoporosis or at high risk for fracture should have regular bone mineral density tests. For patients eligible for Medicare, routine testing is allowed once every 2 years. The testing frequency can be increased to one year for patients who have rapidly progressing disease, those who are receiving or discontinuing medical therapy to restore bone mass,  or have additional risk factors. I have reviewed this report and agree with the above findings. [facility] Radiology Electronically Signed   By: Franki Cabot M.D.   On: 10/01/2018 12:16   US Breast Ltd Uni Left Inc Axilla  Result Date: 10/05/2018 CLINICAL DATA:  78 year old female recalled from screening mammogram dated 10/01/2018 for possible right breast distortion possible left breast mass. EXAM: DIGITAL DIAGNOSTIC BILATERAL MAMMOGRAM WITH CAD AND TOMO ULTRASOUND BILATERAL BREAST COMPARISON:  Previous exam(s). ACR Breast Density Category c: The breast tissue is heterogeneously dense, which may obscure small masses.  FINDINGS: There is a persistent spiculated, hyperdense mass in the medial right breast at anterior depth. Further evaluation with ultrasound was performed. There is a persistent circumscribed, lobulated mass with associated calcifications in the far posterior central left breast. Further evaluation with ultrasound was performed. Mammographic images were processed with CAD. Targeted ultrasound is performed, showing an irregular hypoechoic mass with associated vascularity at the 1 o'clock position 2 cm from the nipple on the right. It measures 2.1 x 1.7 x 1.5 cm. This correlates well with the mammographic abnormality. Evaluation of the left breast demonstrates an irregular hypoechoic mass with internal echogenic foci at the 10 o'clock position 9 cm from the nipple. It measures 1.3 x 0.5 x 0.3 cm. There is no internal vascularity. This correlates with the mammographic finding. No suspicious axillary lymphadenopathy bilaterally. IMPRESSION: 1. Highly suspicious right breast mass corresponding with the screening mammographic findings. Recommendation is for ultrasound-guided biopsy. 2. Indeterminate left breast mass. This may represent a benign etiology such as a degenerating fibroadenoma. However, ultrasound-guided biopsy for definitive tissue diagnosis is recommended. 3. No suspicious lymphadenopathy bilaterally. RECOMMENDATION: Two area ultrasound-guided biopsy of the bilateral breasts. I have discussed the findings and recommendations with the patient. Results were also provided in writing at the conclusion of the visit. If applicable, a reminder letter will be sent to the patient regarding the next appointment. BI-RADS CATEGORY  5: Highly suggestive of malignancy. Electronically Signed   By: Kristopher Oppenheim M.D.   On: 10/05/2018 11:10   US Breast Ltd Uni Right Inc Axilla  Result Date: 10/05/2018 CLINICAL DATA:  78 year old female recalled from screening mammogram dated 10/01/2018 for possible right breast  distortion possible left breast mass. EXAM: DIGITAL DIAGNOSTIC BILATERAL MAMMOGRAM WITH CAD AND TOMO ULTRASOUND BILATERAL BREAST COMPARISON:  Previous exam(s). ACR Breast Density Category c: The breast tissue is heterogeneously dense, which may obscure small masses. FINDINGS: There is a persistent spiculated, hyperdense mass in the medial right breast at anterior depth. Further evaluation with ultrasound was performed. There is a persistent circumscribed, lobulated mass with associated calcifications in the far posterior central left breast. Further evaluation with ultrasound was performed. Mammographic images were processed with CAD. Targeted ultrasound is performed, showing an irregular hypoechoic mass with associated vascularity at the 1 o'clock position 2 cm from the nipple on the right. It measures 2.1 x 1.7 x 1.5 cm. This correlates well with the mammographic abnormality. Evaluation of the left breast demonstrates an irregular hypoechoic mass with internal echogenic foci at the 10 o'clock position 9 cm from the nipple. It measures 1.3 x 0.5 x 0.3 cm. There is no internal vascularity. This correlates with the mammographic finding. No suspicious axillary lymphadenopathy bilaterally. IMPRESSION: 1. Highly suspicious right breast mass corresponding with the screening mammographic findings. Recommendation is for ultrasound-guided biopsy. 2. Indeterminate left breast mass. This may represent a benign etiology such as a degenerating fibroadenoma. However, ultrasound-guided biopsy for definitive tissue diagnosis is  recommended. 3. No suspicious lymphadenopathy bilaterally. RECOMMENDATION: Two area ultrasound-guided biopsy of the bilateral breasts. I have discussed the findings and recommendations with the patient. Results were also provided in writing at the conclusion of the visit. If applicable, a reminder letter will be sent to the patient regarding the next appointment. BI-RADS CATEGORY  5: Highly suggestive of  malignancy. Electronically Signed   By: Kristopher Oppenheim M.D.   On: 10/05/2018 11:10   Mm Diag Breast Tomo Bilateral  Result Date: 10/05/2018 CLINICAL DATA:  78 year old female recalled from screening mammogram dated 10/01/2018 for possible right breast distortion possible left breast mass. EXAM: DIGITAL DIAGNOSTIC BILATERAL MAMMOGRAM WITH CAD AND TOMO ULTRASOUND BILATERAL BREAST COMPARISON:  Previous exam(s). ACR Breast Density Category c: The breast tissue is heterogeneously dense, which may obscure small masses. FINDINGS: There is a persistent spiculated, hyperdense mass in the medial right breast at anterior depth. Further evaluation with ultrasound was performed. There is a persistent circumscribed, lobulated mass with associated calcifications in the far posterior central left breast. Further evaluation with ultrasound was performed. Mammographic images were processed with CAD. Targeted ultrasound is performed, showing an irregular hypoechoic mass with associated vascularity at the 1 o'clock position 2 cm from the nipple on the right. It measures 2.1 x 1.7 x 1.5 cm. This correlates well with the mammographic abnormality. Evaluation of the left breast demonstrates an irregular hypoechoic mass with internal echogenic foci at the 10 o'clock position 9 cm from the nipple. It measures 1.3 x 0.5 x 0.3 cm. There is no internal vascularity. This correlates with the mammographic finding. No suspicious axillary lymphadenopathy bilaterally. IMPRESSION: 1. Highly suspicious right breast mass corresponding with the screening mammographic findings. Recommendation is for ultrasound-guided biopsy. 2. Indeterminate left breast mass. This may represent a benign etiology such as a degenerating fibroadenoma. However, ultrasound-guided biopsy for definitive tissue diagnosis is recommended. 3. No suspicious lymphadenopathy bilaterally. RECOMMENDATION: Two area ultrasound-guided biopsy of the bilateral breasts. I have discussed  the findings and recommendations with the patient. Results were also provided in writing at the conclusion of the visit. If applicable, a reminder letter will be sent to the patient regarding the next appointment. BI-RADS CATEGORY  5: Highly suggestive of malignancy. Electronically Signed   By: Kristopher Oppenheim M.D.   On: 10/05/2018 11:10   Mm 3d Screen Breast Bilateral  Result Date: 10/01/2018 CLINICAL DATA:  Screening. EXAM: DIGITAL SCREENING BILATERAL MAMMOGRAM WITH TOMO AND CAD COMPARISON:  Previous exam(s). ACR Breast Density Category c: The breast tissue is heterogeneously dense, which may obscure small masses. FINDINGS: In the right breast distortion requires further evaluation. In the left breast mass requires further evaluation. Images were processed with CAD. IMPRESSION: Further evaluation is suggested for possible distortion in the right breast. Further evaluation is suggested for possible mass in the left breast. RECOMMENDATION: Diagnostic mammogram and possibly ultrasound of both breasts. (Code:[id number]) The patient will be contacted regarding the findings, and additional imaging will be scheduled. BI-RADS CATEGORY  0: Incomplete. Need additional imaging evaluation and/or prior mammograms for comparison. Electronically Signed   By: Lovey Newcomer M.D.   On: 10/01/2018 12:17   Mm Clip Placement Left  Result Date: 10/07/2018 CLINICAL DATA:  Bilateral breast biopsies were performed today. EXAM: DIAGNOSTIC BILATERAL MAMMOGRAM POST ULTRASOUND BIOPSIES COMPARISON:  Previous exam(s). FINDINGS: Mammographic images were obtained following ultrasound guided biopsy of a suspicious right breast mass 1 o'clock position 2 cm from the nipple. Ribbon shaped biopsy clip is satisfactorily positioned within the biopsied mass. Mammographic  images were obtained following ultrasound-guided biopsy of a left breast mass with calcifications in the far upper inner left breast. A ribbon shaped biopsy clip is satisfactorily  positioned. IMPRESSION: Satisfactory position of ribbon shaped biopsy clips bilaterally. Final Assessment: Post Procedure Mammograms for Marker Placement Electronically Signed   By: Curlene Dolphin M.D.   On: 10/07/2018 15:18   Mm Clip Placement Right  Result Date: 10/07/2018 CLINICAL DATA:  Bilateral breast biopsies were performed today. EXAM: DIAGNOSTIC BILATERAL MAMMOGRAM POST ULTRASOUND BIOPSIES COMPARISON:  Previous exam(s). FINDINGS: Mammographic images were obtained following ultrasound guided biopsy of a suspicious right breast mass 1 o'clock position 2 cm from the nipple. Ribbon shaped biopsy clip is satisfactorily positioned within the biopsied mass. Mammographic images were obtained following ultrasound-guided biopsy of a left breast mass with calcifications in the far upper inner left breast. A ribbon shaped biopsy clip is satisfactorily positioned. IMPRESSION: Satisfactory position of ribbon shaped biopsy clips bilaterally. Final Assessment: Post Procedure Mammograms for Marker Placement Electronically Signed   By: Curlene Dolphin M.D.   On: 10/07/2018 15:18   [place] Lt Breast Bx W Loc Dev 1st Lesion Img Bx Spec US Guide  Addendum Date: 10/08/2018   ADDENDUM REPORT: 10/08/2018 15:03 ADDENDUM: Pathology revealed INVASIVE MAMMARY CARCINOMA WITH CALCIFICATIONS of the RIGHT breast, 1 o'clock, 2 cm from nipple. This was found to be concordant by Dr. Curlene Dolphin. 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 NEGATIVE FOR CARCINOMA. This was found to be concordant by Dr. Curlene Dolphin. Pathology results were discussed with the patient by telephone. The patient reported doing well after the biopsy with tenderness at the site. Post biopsy instructions and care were reviewed and questions were answered. The patient was encouraged to call [facility] for any additional concerns. The patient was referred to [facility] on October 13, 2018. Pathology results reported by Stacie Acres, RN on 10/08/2018. Electronically Signed   By: Curlene Dolphin M.D.   On: 10/08/2018 15:03   Result Date: 10/08/2018 CLINICAL DATA:  Ultrasound-guided core needle biopsy was recommended of a mass with calcifications in the upper inner quadrant of the left breast approximately 10 cm at least 9 cm from nipple. EXAM: ULTRASOUND GUIDED LEFT BREAST CORE NEEDLE BIOPSY COMPARISON:  Previous exam(s). FINDINGS: I met with the patient and we discussed the procedure of ultrasound-guided biopsy, including benefits and alternatives. We discussed the high likelihood of a successful procedure. We discussed the risks of the procedure, including infection, bleeding, tissue injury, clip migration, and inadequate sampling. Informed written consent was given. The usual time-out protocol was performed immediately prior to the procedure. Lesion quadrant: Upper inner quadrant Using sterile technique and 1% Lidocaine as local anesthetic, under direct ultrasound visualization, a 14 gauge spring-loaded device was used to perform biopsy of a mass in the upper inner quadrant of the left breast using a medial approach. At the conclusion of the procedure a ribbon shaped tissue marker clip was deployed into the biopsy cavity. Follow up 2 view mammogram was performed and dictated separately. IMPRESSION: Ultrasound guided biopsy of the left breast. No apparent complications. Electronically Signed: By: Curlene Dolphin M.D. 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 This was found to be concordant by Dr. Curlene Dolphin. 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 NEGATIVE FOR CARCINOMA. This was found to be concordant by Dr. Curlene Dolphin. Pathology results were discussed with the patient by telephone. The patient reported doing well after the biopsy with tenderness at the site. Post biopsy instructions and care were reviewed and questions were answered. The patient was encouraged to call [facility] for any additional concerns. The patient was referred to [facility] on October 13, 2018. Pathology results reported by Stacie Acres, RN on 10/08/2018. Electronically Signed   By: Curlene Dolphin M.D.   On: 10/08/2018 15:05   Result Date: 10/08/2018 CLINICAL DATA:  Ultrasound-guided core needle biopsy was recommended of a suspicious mass 1 o'clock position right breast EXAM: ULTRASOUND GUIDED RIGHT BREAST CORE NEEDLE BIOPSY COMPARISON:  Previous exam(s). FINDINGS: I met with the patient and we discussed the procedure of ultrasound-guided biopsy, including benefits and alternatives. We discussed the high likelihood of a successful procedure. We discussed the risks of the procedure, including infection, bleeding, tissue injury, clip migration, and inadequate sampling. Informed written consent was given. The usual time-out protocol was performed immediately prior to the procedure. Lesion quadrant: Upper inner quadrant Using sterile technique and 1% Lidocaine as local anesthetic, under direct ultrasound visualization, a 12 gauge spring-loaded device was used to perform biopsy of a palpable suspicious mass 1 o'clock position approximately 2 cm from the nipple using a lateral approach. At the conclusion of the procedure a ribbon tissue marker clip was deployed into the biopsy cavity. Follow up 2 view mammogram was performed and dictated separately. IMPRESSION: Ultrasound guided biopsy of the right breast. No apparent complications. Electronically Signed: By: Curlene Dolphin M.D.  On: 10/07/2018 14:52    ASSESSMENT & PLAN:  Kayci Belleville is a 78 y.o. 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   1. Malignant neoplasm of upper-inner quadrant of right breast, StageIA, c(T2N0M0), ER/PR:+, HER2-, Gx -We discussed her image findings and the biopsy results in great details. She has benign lesion in left breast and right breast invasive carcinoma.  -She is likely a candidate for breast conservation. She is agreeable with that, but also interested in b/l lumpectomy to remove both lesions. She was seen by Dr. [name] today and likely will proceed with surgery soon.  -I recommend a Oncotype Dx test on the surgical sample to estimate her risk of cancer recurrence and we'll make a decision about adjuvant chemotherapy based on the Oncotype result. She is 78, but overall healthy and active, still be a candidate for moderate intensive chemotherapy if her Oncotype recurrence score is high.  However patient declines therapy, but agrees with Oncotype test.  -If her surgical sentinel lymph node positive, I may recommend mammaprint for further risk stratification. -The risk of recurrence depends on the stage and biology of the tumor. She has early stage disease, with ER/PR positive and HER2 negative markers. I discussed this is the more common type of slow growing tumor.  -She was also seen by radiation oncologist Dr. [name] today. Adjuvant radiation is recommended to reduce the risk for local recurrence. She is interested in RT.  -Given the strong ER and PR expression in her postmenopausal status, I recommend adjuvant endocrine therapy with aromatase inhibitor or Tamoxifen for  a total of 5 years to reduce the risk of cancer recurrence. Potential benefits and side effects were discussed with patient and she is interested. -We also discussed the breast cancer surveillance after her surgery. She will continue annual screening  mammogram, self exam, and a routine office visit with lab and exam with [place]. -I encouraged her to have healthy diet and exercise regularly -Labs reviewed, CBC and CMP WNL except Hg 11.8, BG 121, Ca 11.1. Physical exam unremarkable except right breast lump, likely secondary to biopsy.   -F/u after surgery and RT.   2. DM, HTN -On amlodipine, glimepiride, janumet -BG at 121 and BP at 162/80 today (10/13/18)   3. Bone health  -08/2018 DEXA was normal with lowest T-score 0.9 at left femur neck.  -She has been on high dose calcium (3079m daily) and Vitamin D. Her Ca at 11.1 today (10/13/18). I suggest her to hold calcium for 1 week and then reduce to 503mdaily.    PLAN:  -Proceed with surgery soon.  -Oncotype on her surgical sample  -I will f/u with her after RT or sooner if Oncotype shows high risk disease.   No orders of the defined types were placed in this encounter.   All questions were answered. The patient knows to call the clinic with any problems, questions or concerns. I spent 40 minutes counseling the patient face to face. The total time spent in the appointment was 50 minutes and more than 50% was on counseling.     YaTruitt MerleMD 10/13/2018 5:00 PM  I, AmJoslyn Devonam acting as scribe for YaTruitt MerleMD.   I have reviewed the above documentation for accuracy and completeness, and I agree with the above.

## 2018-10-13 NOTE — Progress Notes (Signed)
Radiation Oncology         [phone] ________________________________  Multidisciplinary Breast Oncology Clinic [facility]) Initial Outpatient Consultation  Name: Elizabeth Mcfarland MRN: [id number]  Date: 10/13/2018  DOB: November 28, 1940  CC:[name], FNP  [name], MD   REFERRING PHYSICIAN: Autumn Messing III, MD  DIAGNOSIS: The encounter diagnosis was Malignant neoplasm of upper-inner quadrant of right breast in female, estrogen receptor positive ([place]).  Stage IIA (cT2, cN0, cM0) Right Breast UIQ, Invasive Ductal Carcinoma, ER+ / PR+ / Her2 equivocal    ICD-10-CM   1. Malignant neoplasm of upper-inner quadrant of right breast in female, estrogen receptor positive ([place])  C50.211    Z17.0     HISTORY OF PRESENT ILLNESS::Elizabeth Mcfarland is a 78 y.o. female who is presenting to the office today for evaluation of her newly diagnosed breast cancer. She is doing well overall.   She had routine screening mammography on 10/01/2018 showing a possible abnormality in both breasts. She underwent bilateral diagnostic mammography with tomography and bilateral breast ultrasonography at [facility] on 10/05/2018 showing: highly suspicious right breast mass; indeterminate left breast mass; no suspicious lymphadenopathy bilaterally.  Biopsy on 10/07/2018 showed: left breast with fibroadenoma with dystrophic calcifications; right breast with invasive mammary carcinoma with calcifications, e-cadherin positive for ductal phenotype, grade not determined. Prognostic indicators significant for: estrogen receptor, 100% positive and progesterone receptor, 70% positive, both with strong staining intensity. Proliferation marker Ki67 at 10%. HER2 equivocal by immunohistochemistry. FISH results still pending.  Menarche: 78 years old Age at first live birth: 78 years old GP: 1 LMP: 95 (s/p hysterectomy w/o BSO) Contraceptive: yes HRT: no   The patient was referred today for presentation in the multidisciplinary  conference.  Radiology studies and pathology slides were presented there for review and discussion of treatment options.  A consensus was discussed regarding potential next steps.  PREVIOUS RADIATION THERAPY: No  PAST MEDICAL HISTORY:  Past Medical History:  Diagnosis Date   Allergy    Anemia    Blood transfusion without reported diagnosis    Cataract    Chronic kidney disease    Diabetes mellitus without complication ([place])    Esophageal ulcer with bleeding 05/05/2013   05/05/2013 EGD linear distal esophageal ulcer - cause of hematemesis prior to EGD    GERD (gastroesophageal reflux disease)    Hyperlipidemia    Hypertension    Sarcoidosis 1970    PAST SURGICAL HISTORY: Past Surgical History:  Procedure Laterality Date   ABDOMINAL HYSTERECTOMY     COLONOSCOPY     COLONOSCOPY, ESOPHAGOGASTRODUODENOSCOPY (EGD) AND ESOPHAGEAL DILATION N/A 05/05/2013   Procedure: COLONOSCOPY, ESOPHAGOGASTRODUODENOSCOPY (EGD) AND ESOPHAGEAL DILATION (ED);  Surgeon: Gatha Mayer, MD;  Location: [facility];  Service: Endoscopy;  Laterality: N/A;   LITHOTRIPSY     POLYPECTOMY     UPPER GASTROINTESTINAL ENDOSCOPY      FAMILY HISTORY:  Family History  Problem Relation Age of Onset   Diabetes Sister    Kidney failure Sister    Colon polyps Sister    CAD Maternal Grandmother    Early death Mother    Stroke Father    Stroke Brother    Colon polyps Brother    Cancer Brother 43       prostate cancer   Heart disease Brother    Emphysema Brother    Cancer Brother 15       prostate cancer   Asthma Brother    Breast cancer Neg Hx    Colon  cancer Neg Hx    Esophageal cancer Neg Hx    Rectal cancer Neg Hx    Stomach cancer Neg Hx     SOCIAL HISTORY:  Social History   Socioeconomic History   Marital status: Single    Spouse name: Not on file   Number of children: 1   Years of education: Not on file   Highest education level: Not on file    Occupational History   Occupation: Physiological scientist: SELF-EMPLOYED   Occupation: retired  Scientist, product/process development strain: Not hard at [employer]    Worry: Never true    Inability: Never true   Transportation needs    Medical: No    Non-medical: No  Tobacco Use   Smoking status: Never Smoker   Smokeless tobacco: Never Used  Substance and Sexual Activity   Alcohol use: No   Drug use: No   Sexual activity: Not Currently  Lifestyle   Physical activity    Days per week: 0 days    Minutes per session: 0 min   Stress: Not at all  Relationships   Social connections    Talks on phone: Not on file    Gets together: Not on file    Attends religious service: Not on file    Active member of club or organization: Not on file    Attends meetings of clubs or organizations: Not on file    Relationship status: Not on file  Other Topics Concern   Not on file  Social History Narrative   Not on file    ALLERGIES:  Allergies  Allergen Reactions   Codeine Nausea And Vomiting   Percocet [Oxycodone-Acetaminophen] Nausea And Vomiting    MEDICATIONS:  Current Outpatient Medications  Medication Sig Dispense Refill   acetaminophen (TYLENOL) 500 MG tablet Take 1,000 mg by mouth daily as needed for headache (pain).     amLODipine (NORVASC) 10 MG tablet TAKE 1 TABLET BY MOUTH EVERY DAY 90 tablet 1   Ascorbic Acid (VITAMIN C) 1000 MG tablet Take 1,000 mg by mouth daily.     aspirin EC 81 MG tablet Take 81 mg by mouth daily.     cholecalciferol (VITAMIN D) 1000 UNITS tablet Take 3,000 Units by mouth daily.      glimepiride (AMARYL) 4 MG tablet TAKE 1 TABLET BY MOUTH TWICE DAILY 180 tablet 0   JANUMET 50-500 MG tablet TAKE 1 TABLET BY MOUTH EVERY DAY WITH MEALS 30 tablet 2   LANSOPRAZOLE PO Take by mouth.     Multiple Vitamin (MULTIVITAMIN WITH MINERALS) TABS tablet Take 1 tablet by mouth daily. Centrum Silver     Polyethyl Glycol-Propyl  Glycol (SYSTANE OP) Place 1 drop into both eyes daily as needed (dry eyes).      polyethylene glycol (MIRALAX / GLYCOLAX) packet Take 17 g by mouth daily. Mix in 8 oz liquid and drink     No current facility-administered medications for this encounter.     REVIEW OF SYSTEMS: A 10+ POINT REVIEW OF SYSTEMS WAS OBTAINED including neurology, dermatology, psychiatry, cardiac, respiratory, lymph, extremities, GI, GU, musculoskeletal, constitutional, reproductive, HEENT. On the provided form, she reports wearing glasses, having dentures, sleeping with 4 pillows, heartburn, kidney disease, breast lump, arthritis, anxiety, and diabetes. She denies issues with ears, nose, throat, heart, skin, nervous system and any other symptoms.    PHYSICAL EXAM:  Vitals with BMI 10/13/2018  Height 5' 2.1"  Weight 136  lbs  BMI 04.88  Systolic 891  Diastolic 80  Pulse 89  Respirations 17  Lungs are clear to auscultation bilaterally. Heart has regular rate and rhythm. No palpable cervical, supraclavicular, or axillary adenopathy. Abdomen soft, non-tender, normal bowel sounds. Breast: left breast with no palpable mass, nipple discharge, or bleeding. Right breast with palpable induration in the upper inner (1 o'clock) portion, estimated to be approximately 1.5-2 cm, and no nipple discharge or bleeding.  ECOG = 1  0 - Asymptomatic (Fully active, able to carry on all predisease activities without restriction)  1 - Symptomatic but completely ambulatory (Restricted in physically strenuous activity but ambulatory and able to carry out work of a light or sedentary nature. For example, light housework, office work)  2 - Symptomatic, <50% in bed during the day (Ambulatory and capable of all self care but unable to carry out any work activities. Up and about more than 50% of waking hours)  3 - Symptomatic, >50% in bed, but not bedbound (Capable of only limited self-care, confined to bed or chair 50% or more of waking  hours)  4 - Bedbound (Completely disabled. Cannot carry on any self-care. Totally confined to bed or chair)  5 - Death   Elizabeth Mcfarland MM, Creech RH, Tormey DC, et al. [phone]). "Toxicity and response criteria of the [facility] Group". [place] Oncol. 5 (6): 649-55  LABORATORY DATA:  Lab Results  Component Value Date   WBC 6.3 10/13/2018   HGB 11.8 (L) 10/13/2018   HCT 36.7 10/13/2018   MCV 91.5 10/13/2018   PLT 282 10/13/2018   Lab Results  Component Value Date   NA 143 10/13/2018   K 3.7 10/13/2018   CL 100 10/13/2018   CO2 31 10/13/2018   Lab Results  Component Value Date   ALT 20 10/13/2018   AST 25 10/13/2018   ALKPHOS 74 10/13/2018   BILITOT 0.6 10/13/2018    PULMONARY FUNCTION TEST:   Recent Review Flowsheet Data    There is no flowsheet data to display.      RADIOGRAPHY: Dg Bone Density (dxa)  Result Date: 10/01/2018 EXAM: DUAL X-RAY ABSORPTIOMETRY (DXA) FOR BONE MINERAL DENSITY IMPRESSION: Referring Physician:  [name] Your patient completed a BMD test using Lunar IDXA DXA system ( analysis version: 16 ) manufactured by [employer]. Technologist: AW PATIENT: Name: Clothilde, Tippetts Patient ID: [id number] Birth Date: 09-Jun-1940 Height: 64.0 in. Sex: Female Measured: 10/01/2018 Weight: 137.6 lbs. Indications: Advanced Age, Estrogen Deficient, Postmenopausal Fractures: None Treatments: Vitamin D (E933.5) ASSESSMENT: The BMD measured at Femur Neck Left is 0.916 g/cm2 with a T-score of -0.9. This patient is considered normal according to [facility]) criteria. The scan quality is good. Site Region Measured Date Measured Age YA BMD Significant CHANGE T-score DualFemur Neck Left  10/01/2018    [age]         -0.9    0.916 g/cm2 AP Spine  L1-L4      10/01/2018    [age]         1.2     1.326 g/cm2 DualFemur Total Mean 10/01/2018    [age]         0.1     1.024 g/cm2 World Health Organization [facility]) criteria for post-menopausal, Caucasian Women: Normal        T-score at or above -1 SD Osteopenia   T-score between -1 and -2.5 SD Osteoporosis T-score at or below -2.5 SD RECOMMENDATION: 1. All patients should optimize calcium  and vitamin D intake. 2. Consider FDA approved medical therapies in postmenopausal women and men aged 58 years and older, based on the following: a. A hip or vertebral (clinical or morphometric) fracture b. T- score < or = -2.5 at the femoral neck or spine after appropriate evaluation to exclude secondary causes c. Low bone mass (T-score between -1.0 and -2.5 at the femoral neck or spine) and a 10 year probability of a hip fracture > or = 3% or a 10 year probability of a major osteoporosis-related fracture > or = 20% based on the [place]-adapted WHO algorithm d. Clinician judgment and/or patient preferences may indicate treatment for people with 10-year fracture probabilities above or below these levels FOLLOW-UP: Patients with diagnosis of osteoporosis or at high risk for fracture should have regular bone mineral density tests. For patients eligible for Medicare, routine testing is allowed once every 2 years. The testing frequency can be increased to one year for patients who have rapidly progressing disease, those who are receiving or discontinuing medical therapy to restore bone mass, or have additional risk factors. I have reviewed this report and agree with the above findings. [facility] Electronically Signed   By: Franki Cabot M.D.   On: 10/01/2018 12:16   US Breast Ltd Uni Left Inc Axilla  Result Date: 10/05/2018 CLINICAL DATA:  78 year old female recalled from screening mammogram dated 10/01/2018 for possible right breast distortion possible left breast mass. EXAM: DIGITAL DIAGNOSTIC BILATERAL MAMMOGRAM WITH CAD AND TOMO ULTRASOUND BILATERAL BREAST COMPARISON:  Previous exam(s). ACR Breast Density Category c: The breast tissue is heterogeneously dense, which may obscure small masses. FINDINGS: There is a persistent spiculated,  hyperdense mass in the medial right breast at anterior depth. Further evaluation with ultrasound was performed. There is a persistent circumscribed, lobulated mass with associated calcifications in the far posterior central left breast. Further evaluation with ultrasound was performed. Mammographic images were processed with CAD. Targeted ultrasound is performed, showing an irregular hypoechoic mass with associated vascularity at the 1 o'clock position 2 cm from the nipple on the right. It measures 2.1 x 1.7 x 1.5 cm. This correlates well with the mammographic abnormality. Evaluation of the left breast demonstrates an irregular hypoechoic mass with internal echogenic foci at the 10 o'clock position 9 cm from the nipple. It measures 1.3 x 0.5 x 0.3 cm. There is no internal vascularity. This correlates with the mammographic finding. No suspicious axillary lymphadenopathy bilaterally. IMPRESSION: 1. Highly suspicious right breast mass corresponding with the screening mammographic findings. Recommendation is for ultrasound-guided biopsy. 2. Indeterminate left breast mass. This may represent a benign etiology such as a degenerating fibroadenoma. However, ultrasound-guided biopsy for definitive tissue diagnosis is recommended. 3. No suspicious lymphadenopathy bilaterally. RECOMMENDATION: Two area ultrasound-guided biopsy of the bilateral breasts. I have discussed the findings and recommendations with the patient. Results were also provided in writing at the conclusion of the visit. If applicable, a reminder letter will be sent to the patient regarding the next appointment. BI-RADS CATEGORY  5: Highly suggestive of malignancy. Electronically Signed   By: Kristopher Oppenheim M.D.   On: 10/05/2018 11:10   US Breast Ltd Uni Right Inc Axilla  Result Date: 10/05/2018 CLINICAL DATA:  77 year old female recalled from screening mammogram dated 10/01/2018 for possible right breast distortion possible left breast mass. EXAM: DIGITAL  DIAGNOSTIC BILATERAL MAMMOGRAM WITH CAD AND TOMO ULTRASOUND BILATERAL BREAST COMPARISON:  Previous exam(s). ACR Breast Density Category c: The breast tissue is heterogeneously dense, which may obscure small masses.  FINDINGS: There is a persistent spiculated, hyperdense mass in the medial right breast at anterior depth. Further evaluation with ultrasound was performed. There is a persistent circumscribed, lobulated mass with associated calcifications in the far posterior central left breast. Further evaluation with ultrasound was performed. Mammographic images were processed with CAD. Targeted ultrasound is performed, showing an irregular hypoechoic mass with associated vascularity at the 1 o'clock position 2 cm from the nipple on the right. It measures 2.1 x 1.7 x 1.5 cm. This correlates well with the mammographic abnormality. Evaluation of the left breast demonstrates an irregular hypoechoic mass with internal echogenic foci at the 10 o'clock position 9 cm from the nipple. It measures 1.3 x 0.5 x 0.3 cm. There is no internal vascularity. This correlates with the mammographic finding. No suspicious axillary lymphadenopathy bilaterally. IMPRESSION: 1. Highly suspicious right breast mass corresponding with the screening mammographic findings. Recommendation is for ultrasound-guided biopsy. 2. Indeterminate left breast mass. This may represent a benign etiology such as a degenerating fibroadenoma. However, ultrasound-guided biopsy for definitive tissue diagnosis is recommended. 3. No suspicious lymphadenopathy bilaterally. RECOMMENDATION: Two area ultrasound-guided biopsy of the bilateral breasts. I have discussed the findings and recommendations with the patient. Results were also provided in writing at the conclusion of the visit. If applicable, a reminder letter will be sent to the patient regarding the next appointment. BI-RADS CATEGORY  5: Highly suggestive of malignancy. Electronically Signed   By: Kristopher Oppenheim  M.D.   On: 10/05/2018 11:10   Mm Diag Breast Tomo Bilateral  Result Date: 10/05/2018 CLINICAL DATA:  78 year old female recalled from screening mammogram dated 10/01/2018 for possible right breast distortion possible left breast mass. EXAM: DIGITAL DIAGNOSTIC BILATERAL MAMMOGRAM WITH CAD AND TOMO ULTRASOUND BILATERAL BREAST COMPARISON:  Previous exam(s). ACR Breast Density Category c: The breast tissue is heterogeneously dense, which may obscure small masses. FINDINGS: There is a persistent spiculated, hyperdense mass in the medial right breast at anterior depth. Further evaluation with ultrasound was performed. There is a persistent circumscribed, lobulated mass with associated calcifications in the far posterior central left breast. Further evaluation with ultrasound was performed. Mammographic images were processed with CAD. Targeted ultrasound is performed, showing an irregular hypoechoic mass with associated vascularity at the 1 o'clock position 2 cm from the nipple on the right. It measures 2.1 x 1.7 x 1.5 cm. This correlates well with the mammographic abnormality. Evaluation of the left breast demonstrates an irregular hypoechoic mass with internal echogenic foci at the 10 o'clock position 9 cm from the nipple. It measures 1.3 x 0.5 x 0.3 cm. There is no internal vascularity. This correlates with the mammographic finding. No suspicious axillary lymphadenopathy bilaterally. IMPRESSION: 1. Highly suspicious right breast mass corresponding with the screening mammographic findings. Recommendation is for ultrasound-guided biopsy. 2. Indeterminate left breast mass. This may represent a benign etiology such as a degenerating fibroadenoma. However, ultrasound-guided biopsy for definitive tissue diagnosis is recommended. 3. No suspicious lymphadenopathy bilaterally. RECOMMENDATION: Two area ultrasound-guided biopsy of the bilateral breasts. I have discussed the findings and recommendations with the patient.  Results were also provided in writing at the conclusion of the visit. If applicable, a reminder letter will be sent to the patient regarding the next appointment. BI-RADS CATEGORY  5: Highly suggestive of malignancy. Electronically Signed   By: Kristopher Oppenheim M.D.   On: 10/05/2018 11:10   Mm 3d Screen Breast Bilateral  Result Date: 10/01/2018 CLINICAL DATA:  Screening. EXAM: DIGITAL SCREENING BILATERAL MAMMOGRAM WITH TOMO AND CAD COMPARISON:  Previous exam(s). ACR Breast Density Category c: The breast tissue is heterogeneously dense, which may obscure small masses. FINDINGS: In the right breast distortion requires further evaluation. In the left breast mass requires further evaluation. Images were processed with CAD. IMPRESSION: Further evaluation is suggested for possible distortion in the right breast. Further evaluation is suggested for possible mass in the left breast. RECOMMENDATION: Diagnostic mammogram and possibly ultrasound of both breasts. (Code:[id number]) The patient will be contacted regarding the findings, and additional imaging will be scheduled. BI-RADS CATEGORY  0: Incomplete. Need additional imaging evaluation and/or prior mammograms for comparison. Electronically Signed   By: Lovey Newcomer M.D.   On: 10/01/2018 12:17   Mm Clip Placement Left  Result Date: 10/07/2018 CLINICAL DATA:  Bilateral breast biopsies were performed today. EXAM: DIAGNOSTIC BILATERAL MAMMOGRAM POST ULTRASOUND BIOPSIES COMPARISON:  Previous exam(s). FINDINGS: Mammographic images were obtained following ultrasound guided biopsy of a suspicious right breast mass 1 o'clock position 2 cm from the nipple. Ribbon shaped biopsy clip is satisfactorily positioned within the biopsied mass. Mammographic images were obtained following ultrasound-guided biopsy of a left breast mass with calcifications in the far upper inner left breast. A ribbon shaped biopsy clip is satisfactorily positioned. IMPRESSION: Satisfactory position of  ribbon shaped biopsy clips bilaterally. Final Assessment: Post Procedure Mammograms for Marker Placement Electronically Signed   By: Curlene Dolphin M.D.   On: 10/07/2018 15:18   Mm Clip Placement Right  Result Date: 10/07/2018 CLINICAL DATA:  Bilateral breast biopsies were performed today. EXAM: DIAGNOSTIC BILATERAL MAMMOGRAM POST ULTRASOUND BIOPSIES COMPARISON:  Previous exam(s). FINDINGS: Mammographic images were obtained following ultrasound guided biopsy of a suspicious right breast mass 1 o'clock position 2 cm from the nipple. Ribbon shaped biopsy clip is satisfactorily positioned within the biopsied mass. Mammographic images were obtained following ultrasound-guided biopsy of a left breast mass with calcifications in the far upper inner left breast. A ribbon shaped biopsy clip is satisfactorily positioned. IMPRESSION: Satisfactory position of ribbon shaped biopsy clips bilaterally. Final Assessment: Post Procedure Mammograms for Marker Placement Electronically Signed   By: Curlene Dolphin M.D.   On: 10/07/2018 15:18   [place] Lt Breast Bx W Loc Dev 1st Lesion Img Bx Spec US Guide  Addendum Date: 10/08/2018   ADDENDUM REPORT: 10/08/2018 15:03 ADDENDUM: Pathology revealed INVASIVE MAMMARY CARCINOMA WITH CALCIFICATIONS of the RIGHT breast, 1 o'clock, 2 cm from nipple. This was found to be concordant by Dr. Curlene Dolphin. 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 NEGATIVE FOR CARCINOMA. This was found to be concordant by Dr. Curlene Dolphin. Pathology results were discussed with the patient by telephone. The patient reported doing well after the biopsy with tenderness at the site. Post biopsy instructions and care were reviewed and questions were answered. The patient was encouraged to call [facility] for any additional concerns. The patient was referred to [facility] on October 13, 2018. Pathology results reported by Stacie Acres, RN on 10/08/2018. Electronically Signed   By: Curlene Dolphin M.D.   On: 10/08/2018 15:03   Result Date: 10/08/2018 CLINICAL DATA:  Ultrasound-guided core needle biopsy was recommended of a mass with calcifications in the upper inner quadrant of the left breast approximately 10 cm at least 9 cm from nipple. EXAM: ULTRASOUND GUIDED LEFT BREAST CORE NEEDLE BIOPSY COMPARISON:  Previous exam(s). FINDINGS: I met with the patient and we discussed the procedure of ultrasound-guided biopsy,  including benefits and alternatives. We discussed the high likelihood of a successful procedure. We discussed the risks of the procedure, including infection, bleeding, tissue injury, clip migration, and inadequate sampling. Informed written consent was given. The usual time-out protocol was performed immediately prior to the procedure. Lesion quadrant: Upper inner quadrant Using sterile technique and 1% Lidocaine as local anesthetic, under direct ultrasound visualization, a 14 gauge spring-loaded device was used to perform biopsy of a mass in the upper inner quadrant of the left breast using a medial approach. At the conclusion of the procedure a ribbon shaped tissue marker clip was deployed into the biopsy cavity. Follow up 2 view mammogram was performed and dictated separately. IMPRESSION: Ultrasound guided biopsy of the left breast. No apparent complications. Electronically Signed: By: Curlene Dolphin M.D. On: 10/07/2018 14:53   [place] Rt Breast Bx W Loc Dev 1st Lesion Img Bx Spec US Guide  Addendum Date: 10/08/2018   ADDENDUM REPORT: 10/08/2018 15:05 ADDENDUM: Pathology revealed INVASIVE MAMMARY CARCINOMA WITH CALCIFICATIONS of the RIGHT breast, 1 o'clock, 2 cm from nipple. This was found to be concordant by Dr. Curlene Dolphin. 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 NEGATIVE FOR CARCINOMA. This was found to be  concordant by Dr. Curlene Dolphin. Pathology results were discussed with the patient by telephone. The patient reported doing well after the biopsy with tenderness at the site. Post biopsy instructions and care were reviewed and questions were answered. The patient was encouraged to call [facility] for any additional concerns. The patient was referred to [facility] on October 13, 2018. Pathology results reported by Stacie Acres, RN on 10/08/2018. Electronically Signed   By: Curlene Dolphin M.D.   On: 10/08/2018 15:05   Result Date: 10/08/2018 CLINICAL DATA:  Ultrasound-guided core needle biopsy was recommended of a suspicious mass 1 o'clock position right breast EXAM: ULTRASOUND GUIDED RIGHT BREAST CORE NEEDLE BIOPSY COMPARISON:  Previous exam(s). FINDINGS: I met with the patient and we discussed the procedure of ultrasound-guided biopsy, including benefits and alternatives. We discussed the high likelihood of a successful procedure. We discussed the risks of the procedure, including infection, bleeding, tissue injury, clip migration, and inadequate sampling. Informed written consent was given. The usual time-out protocol was performed immediately prior to the procedure. Lesion quadrant: Upper inner quadrant Using sterile technique and 1% Lidocaine as local anesthetic, under direct ultrasound visualization, a 12 gauge spring-loaded device was used to perform biopsy of a palpable suspicious mass 1 o'clock position approximately 2 cm from the nipple using a lateral approach. At the conclusion of the procedure a ribbon tissue marker clip was deployed into the biopsy cavity. Follow up 2 view mammogram was performed and dictated separately. IMPRESSION: Ultrasound guided biopsy of the right breast. No apparent complications. Electronically Signed: By: Curlene Dolphin M.D. On: 10/07/2018 14:52      IMPRESSION: Stage IIA (cT2,  cN0, cM0) Right Breast UIQ, Invasive Ductal Carcinoma, ER+ / PR+ / Her2 equivocal  Patient will be a good candidate for breast conservation. Depending on the size of the tumor, as well as grade, Her-2 neu status she may be a potential candidate for radiation therapy. We discussed the general course of radiation, potential side effects, and toxicities with radiation and the patient is interested in this approach.    PLAN:  1. Patient will proceed with lumpectomy 2. Oncotype DX test 3. We will determine the  Her2 status, as this may impact medical and radiation oncology recommendations. 4. Adjuvant hormonal therapy   ------------------------------------------------  Blair Promise, PhD, MD  This document serves as a record of services personally performed by [name], MD. It was created on his behalf by Wilburn Mylar, a trained medical scribe. The creation of this record is based on the scribe's personal observations and the provider's statements to them. This document has been checked and approved by the attending provider.

## 2018-10-14 ENCOUNTER — Telehealth: Payer: Self-pay | Admitting: Hematology

## 2018-10-14 ENCOUNTER — Other Ambulatory Visit: Payer: Self-pay | Admitting: General Surgery

## 2018-10-14 DIAGNOSIS — Z170 Estrogen receptor positive status [ER+]: Secondary | ICD-10-CM

## 2018-10-14 DIAGNOSIS — C50211 Malignant neoplasm of upper-inner quadrant of right female breast: Secondary | ICD-10-CM

## 2018-10-14 NOTE — Telephone Encounter (Signed)
No los per 7/22.

## 2018-10-18 ENCOUNTER — Other Ambulatory Visit: Payer: Self-pay | Admitting: Hematology

## 2018-10-18 ENCOUNTER — Telehealth: Payer: Self-pay | Admitting: Hematology

## 2018-10-18 ENCOUNTER — Telehealth: Payer: Self-pay | Admitting: *Deleted

## 2018-10-18 MED ORDER — ANASTROZOLE 1 MG PO TABS: 1.0000 mg | ORAL_TABLET | Freq: Every day | ORAL | 3 refills | Status: DC

## 2018-10-18 NOTE — Telephone Encounter (Signed)
Pt is scheduled for surgery on 11/25/2018. I called pt, recommend her to start Anastrozole while she is waiting for surgery. She agrees and is happy to start. I reviewed the potential side effects with her again, she voiced good understanding, and will call me if she experiences significant side effects. She knows to continue anastrozole after surgery.  [name]  10/18/2018

## 2018-10-18 NOTE — Telephone Encounter (Signed)
Left vm regarding [facility] from 7.22.20. Contact information provided for questions or needs.

## 2018-10-20 ENCOUNTER — Encounter: Payer: Self-pay | Admitting: General Practice

## 2018-10-20 NOTE — Progress Notes (Signed)
Elizabeth Mcfarland presented to [facility] to introduce [name], completing distress screen per protocol.  The patient scored a 2 on the Psychosocial Distress Thermometer which indicates mild distress.   ONCBCN DISTRESS SCREENING 10/20/2018  Screening Type Initial Screening  Distress experienced in past week (1-10) 2  Referral to support programs Yes    Follow up needed: No. Pt has full Support Center team/programming packet. LVM encouraging callback. Please also page if needs arise or circumstances change. Thank you.   [place], [place], [facility] Pager [phone] Voicemail [phone]

## 2018-10-21 ENCOUNTER — Encounter: Payer: Self-pay | Admitting: General Practice

## 2018-10-21 NOTE — Progress Notes (Signed)
[facility] Spiritual Care Note  Received and returned VM from pt. Will try again next week to speak re [place] f/u.   [place], [place], [facility] Pager [phone] Voicemail [phone]

## 2018-10-28 ENCOUNTER — Encounter: Payer: Self-pay | Admitting: General Practice

## 2018-10-28 NOTE — Progress Notes (Signed)
[facility] Spiritual Care Note  Finally connected with Elizabeth Mcfarland by phone after multiple attempts for [facility] follow-up. 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 Per pt, no other needs or questions at this time, but she knows to reach out as needed/desired and verbalized deep appreciation for the call.   [place], [place], [facility] Pager [phone] Voicemail [phone]

## 2018-11-09 ENCOUNTER — Other Ambulatory Visit: Payer: Self-pay | Admitting: Nurse Practitioner

## 2018-11-18 NOTE — Progress Notes (Addendum)
[facility]ORE [place] [place] [address] Phone: [phone] Fax: [phone]    Your procedure is scheduled on Thursday, September 3rd.  Report to [facility] Main Entrance "A" at 8:30A.M., and check in at the Admitting office.  Call this number if you have problems the morning of surgery:  [phone]  Call [phone] if you have any questions prior to your surgery date Monday-Friday 8am-4pm   Remember:  Do not eat after midnight the night before your surgery  You may drink clear liquids until 7:30 the morning of your surgery.   Clear liquids allowed are: Water, Non-Citrus Juices (without pulp), Carbonated Beverages, Clear Tea, Black Coffee Only, and Gatorade    Take these medicines the morning of surgery with A SIP OF WATER  amLODipine (NORVASC)  anastrozole (ARIMIDEX) Polyethyl Glycol-Propyl Glycol (SYSTANE OP)/eye drops polyethylene glycol (MIRALAX / GLYCOLAX)   If needed - acetaminophen (TYLENOL), lansoprazole (PREVACID),   As of today, STOP taking any Aspirin (unless otherwise instructed by your surgeon), Aleve, Naproxen, Ibuprofen, Motrin, Advil, Goody's, BC's, all herbal medications, fish oil, and all vitamins.  Follow your surgeon's instructions on when to stop Aspirin.  If no instructions were given by your surgeon then you will need to call the office to get those instructions.    How to Manage Your Diabetes  WHAT DO I DO ABOUT MY DIABETES MEDICATION?  Marland Kitchen Do not take glimepiride (AMARYL) OR JANUMET/oral diabetes medicines (pills) the morning of surgery.  Before and After Surgery  Why is it important to control my blood sugar before and after surgery? . Improving blood sugar levels before and after surgery helps healing and can limit problems. . A way of improving blood sugar control is eating a healthy diet by: o  Eating less sugar and carbohydrates o   Increasing activity/exercise o  Talking with your doctor about reaching your blood sugar goals . High blood sugars (greater than 180 mg/dL) can raise your risk of infections and slow your recovery, so you will need to focus on controlling your diabetes during the weeks before surgery. . Make sure that the doctor who takes care of your diabetes knows about your planned surgery including the date and location.  How do I manage my blood sugar before surgery? . Check your blood sugar at least 4 times a day, starting 2 days before surgery, to make sure that the level is not too high or low. o Check your blood sugar the morning of your surgery when you wake up and every 2 hours until you get to the Short Stay unit. . If your blood sugar is less than 70 mg/dL, you will need to treat for low blood sugar: o Do not take insulin. o Treat a low blood sugar (less than 70 mg/dL) with  cup of clear juice (cranberry or apple), 4 glucose tablets, OR glucose gel. Recheck blood sugar in 15 minutes after treatment (to make sure it is greater than 70 mg/dL). If your blood sugar is not greater than 70 mg/dL on recheck, call [phone] o  for further instructions. . Report your blood sugar to the short stay nurse when you get to Short Stay.  . If you are admitted to the hospital after surgery: o Your blood sugar will be checked by the staff and you will probably be given insulin after surgery (instead of oral diabetes medicines) to  make sure you have good blood sugar levels. o The goal for blood sugar control after surgery is 80-180 mg/dL.   Reviewed and Endorsed by [facility] Patient Education Committee, August 2015  The Morning of Surgery  Do not wear jewelry, make-up or nail polish.  Do not wear lotions, powders, or perfumes/colognes, or deodorant  Do not shave 48 hours prior to surgery.   Do not bring valuables to the hospital.  [facility] is not responsible for any belongings or valuables.  If you are  a smoker, DO NOT Smoke 24 hours prior to surgery IF you wear a CPAP at night please bring your mask, tubing, and machine the morning of surgery   Remember that you must have someone to transport you home after your surgery, and remain with you for 24 hours if you are discharged the same day.  Contacts, glasses, hearing aids, dentures or bridgework may not be worn into surgery.   Leave your suitcase in the car.  After surgery it may be brought to your room.  For patients admitted to the hospital, discharge time will be determined by your treatment team.  Patients discharged the day of surgery will not be allowed to drive home.   Special instructions:   [place]- Preparing For Surgery  Before surgery, you can play an important role. Because skin is not sterile, your skin needs to be as free of germs as possible. You can reduce the number of germs on your skin by washing with CHG (chlorahexidine gluconate) Soap before surgery.  CHG is an antiseptic cleaner which kills germs and bonds with the skin to continue killing germs even after washing.    Oral Hygiene is also important to reduce your risk of infection.  Remember - BRUSH YOUR TEETH THE MORNING OF SURGERY WITH YOUR REGULAR TOOTHPASTE  Please do not use if you have an allergy to CHG or antibacterial soaps. If your skin becomes reddened/irritated stop using the CHG.  Do not shave (including legs and underarms) for at least 48 hours prior to first CHG shower. It is OK to shave your face.  Please follow these instructions carefully.   1. Shower the NIGHT BEFORE SURGERY and the MORNING OF SURGERY with CHG Soap.   2. If you chose to wash your hair, wash your hair first as usual with your normal shampoo.  3. After you shampoo, rinse your hair and body thoroughly to remove the shampoo.  4. Use CHG as you would any other liquid soap. You can apply CHG directly to the skin and wash gently with a scrungie or a clean washcloth.   5. Apply  the CHG Soap to your body ONLY FROM THE NECK DOWN.  Do not use on open wounds or open sores. Avoid contact with your eyes, ears, mouth and genitals (private parts). Wash Face and genitals (private parts)  with your normal soap.   6. Wash thoroughly, paying special attention to the area where your surgery will be performed.  7. Thoroughly rinse your body with warm water from the neck down.  8. DO NOT shower/wash with your normal soap after using and rinsing off the CHG Soap.  9. Pat yourself dry with a CLEAN TOWEL.  10. Wear CLEAN PAJAMAS to bed the night before surgery, wear comfortable clothes the morning of surgery  11. Place CLEAN SHEETS on your bed the night of your first shower and DO NOT SLEEP WITH PETS.  Day of Surgery: Do not apply any deodorants/lotions.  Please shower the morning of surgery with the CHG soap  Please wear clean clothes to the hospital/surgery center.   Remember to brush your teeth WITH YOUR REGULAR TOOTHPASTE.  Please read over the following fact sheets that you were given.

## 2018-11-19 ENCOUNTER — Encounter (HOSPITAL_COMMUNITY)
Admission: RE | Admit: 2018-11-19 | Discharge: 2018-11-19 | Disposition: A | Discharge status: Home / Self Care | Payer: Medicare Other | Source: Ambulatory Visit | Attending: General Surgery | Admitting: General Surgery

## 2018-11-19 ENCOUNTER — Other Ambulatory Visit: Payer: Self-pay

## 2018-11-19 ENCOUNTER — Encounter (HOSPITAL_COMMUNITY): Payer: Self-pay

## 2018-11-19 DIAGNOSIS — Z20828 Contact with and (suspected) exposure to other viral communicable diseases: Secondary | ICD-10-CM | POA: Insufficient documentation

## 2018-11-19 DIAGNOSIS — Z01812 Encounter for preprocedural laboratory examination: Secondary | ICD-10-CM | POA: Insufficient documentation

## 2018-11-19 HISTORY — DX: Cyst of kidney, acquired: N28.1

## 2018-11-19 HISTORY — DX: Malignant (primary) neoplasm, unspecified: C80.1

## 2018-11-19 HISTORY — DX: Personal history of urinary calculi: Z87.442

## 2018-11-19 LAB — BASIC METABOLIC PANEL
Anion gap: 8 (ref 5–15)
BUN: 9 mg/dL (ref 8–23)
CO2: 26 mmol/L (ref 22–32)
Calcium: 9.3 mg/dL (ref 8.9–10.3)
Chloride: 104 mmol/L (ref 98–111)
Creatinine, Ser: 0.8 mg/dL (ref 0.44–1.00)
GFR calc Af Amer: >60 mL/min (ref >60)
GFR calc non Af Amer: >60 mL/min (ref >60)
Glucose, Bld: 194 mg/dL — ABNORMAL HIGH (ref 70–99)
Potassium: 4.1 mmol/L (ref 3.5–5.1)
Sodium: 138 mmol/L (ref 135–145)

## 2018-11-19 LAB — CBC
HCT: 37 % (ref 36.0–46.0)
Hemoglobin: 12.1 g/dL (ref 12.0–15.0)
MCH: 30 pg (ref 26.0–34.0)
MCHC: 32.7 g/dL (ref 30.0–36.0)
MCV: 91.6 fL (ref 80.0–100.0)
Platelets: 291 10*3/uL (ref 150–400)
RBC: 4.04 MIL/uL (ref 3.87–5.11)
RDW: 11.9 % (ref 11.5–15.5)
WBC: 6.2 10*3/uL (ref 4.0–10.5)
nRBC: 0 % (ref 0.0–0.2)

## 2018-11-19 LAB — GLUCOSE, CAPILLARY: Glucose-Capillary: 195 mg/dL — ABNORMAL HIGH (ref 70–99)

## 2018-11-19 NOTE — Progress Notes (Signed)
PCP - [name], FNP Cardiologist - denies  Chest x-ray - denies  EKG - 07/08/2018 Stress Test - 01/04/16 ECHO - 01/04/16 Cardiac Cath - denies  Sleep Study - denies CPAP - N/A  Fasting Blood Sugar - 70 - 135 Checks Blood Sugar 2 times a day A1C -10/07/2018: 6.7   Blood Thinner Instructions: N/A Aspirin Instructions: LD 11/19/2018  Anesthesia review: No  Coronavirus Screening  Have you experienced the following symptoms:  Cough yes/no: No Fever (>100.50F)  yes/no: No Runny nose yes/no: No Sore throat yes/no: No Difficulty breathing/shortness of breath  yes/no: No  Have you or a family member traveled in the last 14 days and where? yes/no: No  If the patient indicates "YES" to the above questions, their PAT will be rescheduled to limit the exposure to others and, the surgeon will be notified. THE PATIENT WILL NEED TO BE ASYMPTOMATIC FOR 14 DAYS.   If the patient is not experiencing any of these symptoms, the PAT nurse will instruct them to NOT bring anyone with them to their appointment since they may have these symptoms or traveled as well.   Please remind your patients and families that hospital visitation restrictions are in effect and the importance of the restrictions.   Patient denies shortness of breath, fever, cough and chest pain at PAT appointment  Patient verbalized understanding of instructions that were given to them at the PAT appointment. Patient was also instructed that they will need to review over the PAT instructions again at home before surgery.

## 2018-11-22 ENCOUNTER — Other Ambulatory Visit (HOSPITAL_COMMUNITY)
Admission: RE | Admit: 2018-11-22 | Discharge: 2018-11-22 | Disposition: A | Discharge status: Home / Self Care | Payer: Medicare Other | Source: Ambulatory Visit | Attending: General Surgery | Admitting: General Surgery

## 2018-11-22 DIAGNOSIS — Z20828 Contact with and (suspected) exposure to other viral communicable diseases: Secondary | ICD-10-CM | POA: Diagnosis not present

## 2018-11-22 DIAGNOSIS — Z01812 Encounter for preprocedural laboratory examination: Secondary | ICD-10-CM | POA: Diagnosis not present

## 2018-11-22 LAB — SARS CORONAVIRUS 2 (TAT 6-24 HRS): SARS Coronavirus 2: NEGATIVE

## 2018-11-23 DIAGNOSIS — C50919 Malignant neoplasm of unspecified site of unspecified female breast: Secondary | ICD-10-CM

## 2018-11-23 HISTORY — PX: BREAST EXCISIONAL BIOPSY: SUR124

## 2018-11-23 HISTORY — DX: Malignant neoplasm of unspecified site of unspecified female breast: C50.919

## 2018-11-24 ENCOUNTER — Ambulatory Visit
Admission: RE | Admit: 2018-11-24 | Discharge: 2018-11-24 | Disposition: A | Discharge status: Home / Self Care | Payer: Medicare Other | Source: Ambulatory Visit | Attending: General Surgery | Admitting: General Surgery

## 2018-11-24 ENCOUNTER — Other Ambulatory Visit: Payer: Self-pay

## 2018-11-24 DIAGNOSIS — C50211 Malignant neoplasm of upper-inner quadrant of right female breast: Secondary | ICD-10-CM | POA: Diagnosis not present

## 2018-11-24 DIAGNOSIS — Z170 Estrogen receptor positive status [ER+]: Secondary | ICD-10-CM

## 2018-11-24 DIAGNOSIS — D242 Benign neoplasm of left breast: Secondary | ICD-10-CM | POA: Diagnosis not present

## 2018-11-25 ENCOUNTER — Ambulatory Visit (HOSPITAL_COMMUNITY): Payer: Medicare Other | Admitting: Vascular Surgery

## 2018-11-25 ENCOUNTER — Ambulatory Visit (HOSPITAL_COMMUNITY)
Admission: RE | Admit: 2018-11-25 | Discharge: 2018-11-25 | Disposition: A | Discharge status: Home / Self Care | Payer: Medicare Other | Source: Ambulatory Visit | Attending: General Surgery | Admitting: General Surgery

## 2018-11-25 ENCOUNTER — Encounter (HOSPITAL_COMMUNITY): Payer: Self-pay

## 2018-11-25 ENCOUNTER — Ambulatory Visit
Admission: RE | Admit: 2018-11-25 | Discharge: 2018-11-25 | Disposition: A | Discharge status: Home / Self Care | Payer: Medicare Other | Source: Ambulatory Visit | Attending: General Surgery | Admitting: General Surgery

## 2018-11-25 ENCOUNTER — Ambulatory Visit (HOSPITAL_COMMUNITY): Payer: Medicare Other | Admitting: Anesthesiology

## 2018-11-25 ENCOUNTER — Other Ambulatory Visit: Payer: Self-pay

## 2018-11-25 ENCOUNTER — Encounter (HOSPITAL_COMMUNITY)
Admission: RE | Disposition: A | Discharge status: Home / Self Care | Payer: Self-pay | Source: Ambulatory Visit | Attending: General Surgery

## 2018-11-25 ENCOUNTER — Encounter (HOSPITAL_COMMUNITY): Payer: Medicare Other

## 2018-11-25 DIAGNOSIS — Z7984 Long term (current) use of oral hypoglycemic drugs: Secondary | ICD-10-CM | POA: Diagnosis not present

## 2018-11-25 DIAGNOSIS — Z170 Estrogen receptor positive status [ER+]: Secondary | ICD-10-CM

## 2018-11-25 DIAGNOSIS — Z7982 Long term (current) use of aspirin: Secondary | ICD-10-CM | POA: Diagnosis not present

## 2018-11-25 DIAGNOSIS — Z79811 Long term (current) use of aromatase inhibitors: Secondary | ICD-10-CM | POA: Insufficient documentation

## 2018-11-25 DIAGNOSIS — E119 Type 2 diabetes mellitus without complications: Secondary | ICD-10-CM | POA: Insufficient documentation

## 2018-11-25 DIAGNOSIS — C50211 Malignant neoplasm of upper-inner quadrant of right female breast: Secondary | ICD-10-CM

## 2018-11-25 DIAGNOSIS — K219 Gastro-esophageal reflux disease without esophagitis: Secondary | ICD-10-CM | POA: Diagnosis not present

## 2018-11-25 DIAGNOSIS — D869 Sarcoidosis, unspecified: Secondary | ICD-10-CM | POA: Insufficient documentation

## 2018-11-25 DIAGNOSIS — Z79899 Other long term (current) drug therapy: Secondary | ICD-10-CM | POA: Diagnosis not present

## 2018-11-25 DIAGNOSIS — C50411 Malignant neoplasm of upper-outer quadrant of right female breast: Secondary | ICD-10-CM | POA: Diagnosis not present

## 2018-11-25 DIAGNOSIS — D242 Benign neoplasm of left breast: Secondary | ICD-10-CM | POA: Diagnosis not present

## 2018-11-25 DIAGNOSIS — I10 Essential (primary) hypertension: Secondary | ICD-10-CM | POA: Diagnosis not present

## 2018-11-25 DIAGNOSIS — G8918 Other acute postprocedural pain: Secondary | ICD-10-CM | POA: Diagnosis not present

## 2018-11-25 DIAGNOSIS — R921 Mammographic calcification found on diagnostic imaging of breast: Secondary | ICD-10-CM | POA: Diagnosis not present

## 2018-11-25 DIAGNOSIS — C50911 Malignant neoplasm of unspecified site of right female breast: Secondary | ICD-10-CM | POA: Diagnosis not present

## 2018-11-25 HISTORY — PX: BREAST LUMPECTOMY WITH RADIOACTIVE SEED AND SENTINEL LYMPH NODE BIOPSY: SHX6550

## 2018-11-25 LAB — GLUCOSE, CAPILLARY
Glucose-Capillary: 77 mg/dL (ref 70–99)
Glucose-Capillary: 172 mg/dL — ABNORMAL HIGH (ref 70–99)
Glucose-Capillary: 190 mg/dL — ABNORMAL HIGH (ref 70–99)

## 2018-11-25 SURGERY — BREAST LUMPECTOMY WITH RADIOACTIVE SEED AND SENTINEL LYMPH NODE BIOPSY
Anesthesia: General | Site: Breast | Laterality: Bilateral

## 2018-11-25 MED ORDER — ROCURONIUM BROMIDE 10 MG/ML (PF) SYRINGE
PREFILLED_SYRINGE | INTRAVENOUS | Status: AC
  Filled 2018-11-25: qty 10

## 2018-11-25 MED ORDER — ONDANSETRON HCL 4 MG/2ML IJ SOLN
INTRAMUSCULAR | Status: DC | PRN
  Administered 2018-11-25: 4 mg via INTRAVENOUS

## 2018-11-25 MED ORDER — LIDOCAINE 2% (20 MG/ML) 5 ML SYRINGE
INTRAMUSCULAR | Status: AC
  Filled 2018-11-25: qty 5

## 2018-11-25 MED ORDER — METHYLENE BLUE 0.5 % INJ SOLN
INTRAVENOUS | Status: AC
  Filled 2018-11-25: qty 10

## 2018-11-25 MED ORDER — LACTATED RINGERS IV SOLN
INTRAVENOUS | Status: DC | PRN
  Administered 2018-11-25: 11:00:00 via INTRAVENOUS

## 2018-11-25 MED ORDER — FENTANYL CITRATE (PF) 100 MCG/2ML IJ SOLN
50.0000 ug | Freq: Once | INTRAMUSCULAR | Status: AC
  Administered 2018-11-25: 10:00:00 50 ug via INTRAVENOUS

## 2018-11-25 MED ORDER — TRAMADOL HCL 50 MG PO TABS: 50.0000 mg | ORAL_TABLET | Freq: Four times a day (QID) | ORAL | 0 refills | Status: DC | PRN

## 2018-11-25 MED ORDER — LIDOCAINE 2% (20 MG/ML) 5 ML SYRINGE
INTRAMUSCULAR | Status: DC | PRN
  Administered 2018-11-25: 40 mg via INTRAVENOUS

## 2018-11-25 MED ORDER — CELECOXIB 200 MG PO CAPS
200.0000 mg | ORAL_CAPSULE | ORAL | Status: AC
  Administered 2018-11-25: 10:00:00 200 mg via ORAL
  Filled 2018-11-25: qty 1

## 2018-11-25 MED ORDER — TECHNETIUM TC 99M SULFUR COLLOID FILTERED
1.0000 | Freq: Once | INTRAVENOUS | Status: AC | PRN
  Administered 2018-11-25: 10:00:00 1 via INTRADERMAL

## 2018-11-25 MED ORDER — PROPOFOL 10 MG/ML IV BOLUS
INTRAVENOUS | Status: AC
  Filled 2018-11-25: qty 20

## 2018-11-25 MED ORDER — STERILE WATER FOR IRRIGATION IR SOLN
Status: DC | PRN
  Administered 2018-11-25: 1000 mL

## 2018-11-25 MED ORDER — FENTANYL CITRATE (PF) 250 MCG/5ML IJ SOLN
INTRAMUSCULAR | Status: AC
  Filled 2018-11-25: qty 5

## 2018-11-25 MED ORDER — CHLORHEXIDINE GLUCONATE CLOTH 2 % EX PADS: 6.0000 | MEDICATED_PAD | Freq: Once | CUTANEOUS | Status: DC

## 2018-11-25 MED ORDER — LABETALOL HCL 5 MG/ML IV SOLN
5.0000 mg | Freq: Once | INTRAVENOUS | Status: AC
  Administered 2018-11-25: 10:00:00 5 mg via INTRAVENOUS

## 2018-11-25 MED ORDER — LABETALOL HCL 5 MG/ML IV SOLN
INTRAVENOUS | Status: AC
  Administered 2018-11-25: 5 mg via INTRAVENOUS
  Filled 2018-11-25: qty 4

## 2018-11-25 MED ORDER — ROCURONIUM BROMIDE 10 MG/ML (PF) SYRINGE
PREFILLED_SYRINGE | INTRAVENOUS | Status: DC | PRN
  Administered 2018-11-25: 80 mg via INTRAVENOUS

## 2018-11-25 MED ORDER — FENTANYL CITRATE (PF) 100 MCG/2ML IJ SOLN
INTRAMUSCULAR | Status: AC
  Administered 2018-11-25: 10:00:00 50 ug via INTRAVENOUS
  Filled 2018-11-25: qty 2

## 2018-11-25 MED ORDER — 0.9 % SODIUM CHLORIDE (POUR BTL) OPTIME
TOPICAL | Status: DC | PRN
  Administered 2018-11-25: 11:00:00 1000 mL

## 2018-11-25 MED ORDER — BUPIVACAINE LIPOSOME 1.3 % IJ SUSP
INTRAMUSCULAR | Status: DC | PRN
  Administered 2018-11-25: 10 mL

## 2018-11-25 MED ORDER — DEXAMETHASONE SODIUM PHOSPHATE 10 MG/ML IJ SOLN
INTRAMUSCULAR | Status: AC
  Filled 2018-11-25: qty 1

## 2018-11-25 MED ORDER — LABETALOL HCL 5 MG/ML IV SOLN
5.0000 mg | Freq: Once | INTRAVENOUS | Status: AC
  Administered 2018-11-25: 5 mg via INTRAVENOUS

## 2018-11-25 MED ORDER — SODIUM CHLORIDE (PF) 0.9 % IJ SOLN
INTRAMUSCULAR | Status: AC
  Filled 2018-11-25: qty 10

## 2018-11-25 MED ORDER — BUPIVACAINE-EPINEPHRINE 0.25% -1:200000 IJ SOLN
INTRAMUSCULAR | Status: DC | PRN
  Administered 2018-11-25: 23 mL

## 2018-11-25 MED ORDER — BUPIVACAINE HCL (PF) 0.5 % IJ SOLN
INTRAMUSCULAR | Status: DC | PRN
  Administered 2018-11-25: 15 mL

## 2018-11-25 MED ORDER — HYDROCODONE-ACETAMINOPHEN 5-325 MG PO TABS: 1.0000 | ORAL_TABLET | Freq: Four times a day (QID) | ORAL | 0 refills | Status: DC | PRN

## 2018-11-25 MED ORDER — MIDAZOLAM HCL 2 MG/2ML IJ SOLN
INTRAMUSCULAR | Status: AC
  Administered 2018-11-25: 10:00:00 1 mg via INTRAVENOUS
  Filled 2018-11-25: qty 2

## 2018-11-25 MED ORDER — PHENYLEPHRINE 40 MCG/ML (10ML) SYRINGE FOR IV PUSH (FOR BLOOD PRESSURE SUPPORT)
PREFILLED_SYRINGE | INTRAVENOUS | Status: AC
  Filled 2018-11-25: qty 20

## 2018-11-25 MED ORDER — MIDAZOLAM HCL 2 MG/2ML IJ SOLN
1.0000 mg | Freq: Once | INTRAMUSCULAR | Status: AC
  Administered 2018-11-25: 10:00:00 1 mg via INTRAVENOUS

## 2018-11-25 MED ORDER — LABETALOL HCL 5 MG/ML IV SOLN
INTRAVENOUS | Status: DC | PRN
  Administered 2018-11-25: 5 mg via INTRAVENOUS

## 2018-11-25 MED ORDER — PROPOFOL 10 MG/ML IV BOLUS
INTRAVENOUS | Status: DC | PRN
  Administered 2018-11-25: 120 mg via INTRAVENOUS

## 2018-11-25 MED ORDER — DEXAMETHASONE SODIUM PHOSPHATE 10 MG/ML IJ SOLN
INTRAMUSCULAR | Status: DC | PRN
  Administered 2018-11-25: 4 mg via INTRAVENOUS

## 2018-11-25 MED ORDER — ONDANSETRON HCL 4 MG/2ML IJ SOLN
INTRAMUSCULAR | Status: AC
  Filled 2018-11-25: qty 2

## 2018-11-25 MED ORDER — LABETALOL HCL 5 MG/ML IV SOLN
INTRAVENOUS | Status: AC
  Filled 2018-11-25: qty 4

## 2018-11-25 MED ORDER — CEFAZOLIN SODIUM-DEXTROSE 2-4 GM/100ML-% IV SOLN
2.0000 g | INTRAVENOUS | Status: AC
  Administered 2018-11-25: 2 g via INTRAVENOUS
  Filled 2018-11-25: qty 100

## 2018-11-25 MED ORDER — LACTATED RINGERS IV SOLN
INTRAVENOUS | Status: DC
  Administered 2018-11-25: 09:00:00 via INTRAVENOUS

## 2018-11-25 MED ORDER — GABAPENTIN 300 MG PO CAPS
300.0000 mg | ORAL_CAPSULE | ORAL | Status: AC
  Administered 2018-11-25: 10:00:00 300 mg via ORAL
  Filled 2018-11-25: qty 1

## 2018-11-25 MED ORDER — SODIUM CHLORIDE 0.9 % IV SOLN
INTRAVENOUS | Status: DC | PRN
  Administered 2018-11-25: 30 ug/min via INTRAVENOUS

## 2018-11-25 MED ORDER — BUPIVACAINE-EPINEPHRINE (PF) 0.25% -1:200000 IJ SOLN
INTRAMUSCULAR | Status: AC
  Filled 2018-11-25: qty 30

## 2018-11-25 MED ORDER — SUGAMMADEX SODIUM 200 MG/2ML IV SOLN
INTRAVENOUS | Status: DC | PRN
  Administered 2018-11-25: 125 mg via INTRAVENOUS

## 2018-11-25 SURGICAL SUPPLY — 40 items
ADH SKN CLS APL DERMABOND .7 (GAUZE/BANDAGES/DRESSINGS) ×1
APL PRP STRL LF DISP 70% ISPRP (MISCELLANEOUS) ×1
APPLIER CLIP 9.375 MED OPEN (MISCELLANEOUS) ×3
APR CLP MED 9.3 20 MLT OPN (MISCELLANEOUS) ×1
BINDER BREAST LRG (GAUZE/BANDAGES/DRESSINGS) ×2 IMPLANT
BINDER BREAST XLRG (GAUZE/BANDAGES/DRESSINGS) IMPLANT
CANISTER SUCT 3000ML PPV (MISCELLANEOUS) ×3 IMPLANT
CHLORAPREP W/TINT 26 (MISCELLANEOUS) ×3 IMPLANT
CLIP APPLIE 9.375 MED OPEN (MISCELLANEOUS) ×1 IMPLANT
CONT SPEC 4OZ CLIKSEAL STRL BL (MISCELLANEOUS) ×3 IMPLANT
COVER PROBE W GEL 5X96 (DRAPES) ×3 IMPLANT
COVER SURGICAL LIGHT HANDLE (MISCELLANEOUS) ×3 IMPLANT
COVER WAND RF STERILE (DRAPES) IMPLANT
DERMABOND ADVANCED (GAUZE/BANDAGES/DRESSINGS) ×2
DERMABOND ADVANCED .7 DNX12 (GAUZE/BANDAGES/DRESSINGS) ×1 IMPLANT
DEVICE DUBIN SPECIMEN MAMMOGRA (MISCELLANEOUS) ×5 IMPLANT
DRAPE CHEST BREAST 15X10 FENES (DRAPES) ×3 IMPLANT
ELECT COATED BLADE 2.86 ST (ELECTRODE) ×3 IMPLANT
ELECT REM PT RETURN 9FT ADLT (ELECTROSURGICAL) ×3
ELECTRODE REM PT RTRN 9FT ADLT (ELECTROSURGICAL) ×1 IMPLANT
GLOVE BIO SURGEON STRL SZ7.5 (GLOVE) ×6 IMPLANT
GOWN STRL REUS W/ TWL LRG LVL3 (GOWN DISPOSABLE) ×2 IMPLANT
GOWN STRL REUS W/TWL LRG LVL3 (GOWN DISPOSABLE) ×6
KIT BASIN OR (CUSTOM PROCEDURE TRAY) ×3 IMPLANT
KIT MARKER MARGIN INK (KITS) ×3 IMPLANT
LIGHT WAVEGUIDE WIDE FLAT (MISCELLANEOUS) IMPLANT
NDL 18GX1X1/2 (RX/OR ONLY) (NEEDLE) IMPLANT
NDL FILTER BLUNT 18X1 1/2 (NEEDLE) IMPLANT
NDL HYPO 25GX1X1/2 BEV (NEEDLE) ×1 IMPLANT
NEEDLE 18GX1X1/2 (RX/OR ONLY) (NEEDLE) IMPLANT
NEEDLE FILTER BLUNT 18X 1/2SAF (NEEDLE)
NEEDLE FILTER BLUNT 18X1 1/2 (NEEDLE) IMPLANT
NEEDLE HYPO 25GX1X1/2 BEV (NEEDLE) ×3 IMPLANT
NS IRRIG 1000ML POUR BTL (IV SOLUTION) ×3 IMPLANT
PACK GENERAL/GYN (CUSTOM PROCEDURE TRAY) ×3 IMPLANT
SUT MNCRL AB 4-0 PS2 18 (SUTURE) ×10 IMPLANT
SUT VIC AB 3-0 SH 18 (SUTURE) ×3 IMPLANT
SYR CONTROL 10ML LL (SYRINGE) ×3 IMPLANT
TOWEL GREEN STERILE (TOWEL DISPOSABLE) ×3 IMPLANT
TOWEL GREEN STERILE FF (TOWEL DISPOSABLE) ×3 IMPLANT

## 2018-11-25 NOTE — Anesthesia Procedure Notes (Signed)
Procedure Name: Intubation Performed by: Zita Ozimek H, CRNA Pre-anesthesia Checklist: Patient identified, Emergency Drugs available, Suction available and Patient being monitored Patient Re-evaluated:Patient Re-evaluated prior to induction Oxygen Delivery Method: Circle System Utilized Preoxygenation: Pre-oxygenation with 100% oxygen Induction Type: IV induction Ventilation: Mask ventilation without difficulty Laryngoscope Size: Miller and 2 Grade View: Grade I Tube type: Oral Tube size: 7.0 mm Number of attempts: 1 Airway Equipment and Method: Stylet and Oral airway Placement Confirmation: ETT inserted through vocal cords under direct vision,  positive ETCO2 and breath sounds checked- equal and bilateral Secured at: 22 cm Tube secured with: Tape Dental Injury: Teeth and Oropharynx as per pre-operative assessment        

## 2018-11-25 NOTE — Op Note (Signed)
11/25/2018  12:17 PM  PATIENT:  Elizabeth Mcfarland  78 y.o. female  PRE-OPERATIVE DIAGNOSIS:  RIGHT BREAST CANCER, LEFT BREAST FIBROADENOMA  POST-OPERATIVE DIAGNOSIS:  RIGHT BREAST CANCER, LEFT BREAST FIBROADENOMA  PROCEDURE:  Procedure(s): BILATERAL BREAST LUMPECTOMIES  WITH BILATERAL  RADIOACTIVE SEEDS AND RIGHT DEEP AXILLARY SENTINEL LYMPH NODE BIOPSY (Bilateral)  SURGEON:  Surgeon(s) and Role:    * [name], MD - Primary  PHYSICIAN ASSISTANT:   ASSISTANTS: none   ANESTHESIA:   local and general  EBL:  10 mL   BLOOD ADMINISTERED:none  DRAINS: none   LOCAL MEDICATIONS USED:  MARCAINE     SPECIMEN:  Source of Specimen:  left breast tissue, right breast tissue and sentinel nodes x 2  DISPOSITION OF SPECIMEN:  PATHOLOGY  COUNTS:  YES  TOURNIQUET:  * No tourniquets in log *  DICTATION: .Dragon Dictation   After informed consent was obtained the patient was brought to the operating room and placed in the supine position on the operating table.  After adequate induction of general anesthesia the patient's bilateral chest, breast, and axillary areas were prepped with ChloraPrep, allowed to dry, and draped in usual sterile manner.  An appropriate timeout was performed.  Previously an I-125 seed was placed in the upper inner portion of the left breast and in the upper inner portion of the right breast to mark an area of a fibroadenoma in the left breast and a breast cancer in the right breast.  Also earlier in the day the patient underwent injection 1 mCi of technetium sulfur colloid in the subareolar position on the right.  Attention was first turned to the left breast.  The neoprobe was set to I-125 in the area of radioactivity was readily identified.  The area around this was infiltrated with quarter percent Marcaine.  A transversely oriented incision was made overlying the area of radioactivity with a 15 blade knife.  The incision was carried through the skin and  subcutaneous tissue sharply with the electrocautery.  A circular portion of breast tissue was then excised sharply around the radioactive seed while checking the area of radioactivity frequently.  Once the specimen was removed it was oriented with the appropriate paint colors.  A specimen radiograph was obtained that showed the clip and seed to be near the center of the specimen.  The specimen was then sent to pathology for further evaluation.  Hemostasis was achieved using the Bovie electrocautery.  The deep layer of the wound was then closed with interrupted 3-0 Vicryl stitches.  The skin was then closed with a running 4-0 Monocryl subcuticular stitch.  Attention was then turned to the right breast.  The neoprobe was set to technetium in the area of radioactivity was readily identified in the right axilla.  This area was infiltrated with quarter percent Marcaine.  A transversely oriented incision was made with a 15 blade knife overlying the area of radioactivity.  The incision was carried through the skin and subcutaneous tissue sharply with electrocautery until the deep right axillary space was entered.  Blunt hemostat dissection was then carried out under the direction of the neoprobe.  I was able to identify one hot lymph node and one palpable lymph node adjacent to it.  These were excised sharply with the electrocautery and the surrounding lymphatics and small vessels were controlled with clips.  Ex vivo counts on the sentinel node was approximately 500.  No other hot or palpable nodes were identified in the right axilla.  The area was examined and found to be hemostatic.  The deep layer of the wound was then closed with interrupted 3-0 Vicryl stitches.  The skin was closed with a running 4-0 Monocryl subcuticular stitch.  Attention was then turned to the right breast.  The neoprobe was again set to I-125 in the area of radioactivity was identified very centrally in the upper inner right breast.  Because of  the size of the cancer in the position I elected to make an elliptical incision in the skin overlying the area of radioactivity.  The incision was carried through the skin and subcutaneous tissue sharply with the electrocautery.  Dissection was then carried out around the radioactivity but in doing so we did come across the radioactive seed.  This was sent to pathology separately.  The cancer did seem to be palpable and so a large lumpectomy cavity was created by dissection around the palpable mass.  This was done sharply with the electrocautery and the dissection was carried all the way to the chest wall.  Once the specimen was removed it was oriented with the appropriate paint colors.  A specimen radiograph was obtained that showed the clip to be near the center of the specimen.  The specimen was then sent to pathology for further evaluation.  Hemostasis was achieved using the Bovie electrocautery.  The wound was irrigated with saline and infiltrated with quarter percent Marcaine.  The cavity was marked with clips.  The deep layer of the cavity was closed with layers of interrupted 3-0 Vicryl stitches.  The skin was then closed with interrupted 4-0 Monocryl subcuticular stitches.  Dermabond dressings were applied.  The patient tolerated the procedure well.  At the end of the case all needle sponge and instrument counts were correct.  The patient was then awakened and taken to recovery in stable condition.  PLAN OF CARE: Discharge to home after PACU  PATIENT DISPOSITION:  PACU - hemodynamically stable.   Delay start of Pharmacological VTE agent (>24hrs) due to surgical blood loss or risk of bleeding: not applicable

## 2018-11-25 NOTE — Anesthesia Postprocedure Evaluation (Signed)
Anesthesia Post Note  Patient: Tanasia Finnicum  Procedure(s) Performed: BILATERAL BREAST LUMPECTOMIES  WITH BILATERAL  RADIOACTIVE SEEDS AND RIGHT SENTINEL LYMPH NODE BIOPSY (Bilateral Breast)     Patient location during evaluation: PACU Anesthesia Type: General Level of consciousness: awake and alert Pain management: pain level controlled Vital Signs Assessment: post-procedure vital signs reviewed and stable Respiratory status: spontaneous breathing, nonlabored ventilation and respiratory function stable Cardiovascular status: blood pressure returned to baseline and stable Postop Assessment: no apparent nausea or vomiting Anesthetic complications: no    Last Vitals:  Vitals:   11/25/18 1359 11/25/18 1400  BP:  (!) 161/76  Pulse: 69   Resp: (!) 21   Temp:  (!) 36.1 C  SpO2: 97%     Last Pain:  Vitals:   11/25/18 1400  TempSrc:   PainSc: 0-No pain                 Audry Pili

## 2018-11-25 NOTE — Interval H&P Note (Signed)
History and Physical Interval Note:  11/25/2018 10:10 AM  Elizabeth Mcfarland  has presented today for surgery, with the diagnosis of RIGHT BREAST CANCER, LEFT BREAST FIBROADENOMA.  The various methods of treatment have been discussed with the patient and family. After consideration of risks, benefits and other options for treatment, the patient has consented to  Procedure(s): BILATERAL BREAST LUMPECTOMIES  WITH BILATERAL  RADIOACTIVE SEEDS AND RIGHT SENTINEL LYMPH NODE BIOPSY (Bilateral) as a surgical intervention.  The patient's history has been reviewed, patient examined, no change in status, stable for surgery.  I have reviewed the patient's chart and labs.  Questions were answered to the patient's satisfaction.     Autumn Messing III

## 2018-11-25 NOTE — H&P (Signed)
Elizabeth Mcfarland  Location: [facility] Patient #: [id number] DOB: 03/11/1941 Undefined / Language: Undefined / Race: Refused to Report/Unreported Female   History of Present Illness  The patient is a 78 year old female who presents with breast cancer.We are asked to see the patient in consultation by Dr. [name] to evaluate her for a new right breast cancer. The patient is a 78 year old black female who presents with a mass in the left breast and a 2.1cm area of distortion in the upper right breast on screening mammogram. 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 the axilla looked neg.   Past Surgical History  Breast Biopsy  Bilateral. Hysterectomy (not due to cancer) - Partial  Oral Surgery   Diagnostic Studies History  Colonoscopy  within last year Mammogram  within last year Pap Smear  1-5 years ago  Medication History Medications Reconciled  Social History Caffeine use  Coffee. No alcohol use  No drug use  Tobacco use  Never smoker.  Family History  Cerebrovascular Accident  Father. Diabetes Mellitus  Sister. Hypertension  Father. Respiratory Condition  Brother.  Pregnancy / Birth History  Age at menarche  82 years. Age of menopause  7-50 Gravida  1 Irregular periods  Length (months) of breastfeeding  12-24 Maternal age  70-20 Para  1  Other Problems Anxiety Disorder  Arthritis  Gastroesophageal Reflux Disease  High blood pressure  Hypercholesterolemia  Kidney Stone  Transfusion history     Review of Systems  General Not Present- Appetite Loss, Chills, Fatigue, Fever, Night Sweats, Weight Gain and Weight Loss. Skin Not Present- Change in Wart/Mole, Dryness, Hives, Jaundice, New Lesions, Non-Healing Wounds, Rash and Ulcer. HEENT Not Present- Earache, Hearing Loss, Hoarseness, Nose Bleed, Oral Ulcers, Ringing in the Ears, Seasonal Allergies,  Sinus Pain, Sore Throat, Visual Disturbances, Wears glasses/contact lenses and Yellow Eyes. Respiratory Not Present- Bloody sputum, Chronic Cough, Difficulty Breathing, Snoring and Wheezing. Breast Not Present- Breast Mass, Breast Pain, Nipple Discharge and Skin Changes. Cardiovascular Not Present- Chest Pain, Difficulty Breathing Lying Down, Leg Cramps, Palpitations, Rapid Heart Rate, Shortness of Breath and Swelling of Extremities. Gastrointestinal Not Present- Abdominal Pain, Bloating, Bloody Stool, Change in Bowel Habits, Chronic diarrhea, Constipation, Difficulty Swallowing, Excessive gas, Gets full quickly at meals, Hemorrhoids, Indigestion, Nausea, Rectal Pain and Vomiting. Female Genitourinary Present- Frequency. Not Present- Nocturia, Painful Urination, Pelvic Pain and Urgency. Musculoskeletal Not Present- Back Pain, Joint Pain, Joint Stiffness, Muscle Pain, Muscle Weakness and Swelling of Extremities. Neurological Not Present- Decreased Memory, Fainting, Headaches, Numbness, Seizures, Tingling, Tremor, Trouble walking and Weakness. Psychiatric Not Present- Anxiety, Bipolar, Change in Sleep Pattern, Depression, Fearful and Frequent crying. Endocrine Not Present- Cold Intolerance, Excessive Hunger, Hair Changes, Heat Intolerance, Hot flashes and New Diabetes. Hematology Not Present- Blood Thinners, Easy Bruising, Excessive bleeding, Gland problems, HIV and Persistent Infections.   Physical Exam  General Mental Status-Alert. General Appearance-Consistent with stated age. Hydration-Well hydrated. Voice-Normal.  Head and Neck Head-normocephalic, atraumatic with no lesions or palpable masses. Trachea-midline. Thyroid Gland Characteristics - normal size and consistency.  Eye Eyeball - Bilateral-Extraocular movements intact. Sclera/Conjunctiva - Bilateral-No scleral icterus.  Chest and Lung Exam Chest and lung exam reveals -quiet, even and easy respiratory effort  with no use of accessory muscles and on auscultation, normal breath sounds, no adventitious sounds and normal vocal resonance. Inspection Chest Wall - Normal. Back - normal.  Breast Note: There is a palpable bruise in the upper right breast and symmetric nodular breast tissue bilaterally. There is no palpable axillary, supraclavicular, or cervical lymphadenopathy   Cardiovascular Cardiovascular examination reveals -normal heart sounds, regular rate and rhythm with no murmurs and normal pedal pulses bilaterally.  Abdomen Inspection Inspection of the abdomen reveals - No Hernias. Skin - Scar - no surgical scars. Palpation/Percussion Palpation and Percussion of the abdomen reveal - Soft, Non Tender, No Rebound tenderness, No Rigidity (guarding) and No hepatosplenomegaly. Auscultation Auscultation of the abdomen reveals - Bowel sounds normal.  Neurologic Neurologic evaluation reveals -alert and oriented x 3 with no impairment of recent or remote memory. Mental Status-Normal.  Musculoskeletal Normal Exam - Left-Upper Extremity Strength Normal and Lower Extremity Strength Normal. Normal Exam - Right-Upper Extremity Strength Normal and Lower Extremity Strength Normal.  Lymphatic Head & Neck  General Head & Neck Lymphatics: Bilateral - Description - Normal. Axillary  General Axillary Region: Bilateral - Description - Normal. Tenderness - Non Tender. Femoral & Inguinal  Generalized Femoral & Inguinal Lymphatics: Bilateral - Description - Normal. Tenderness - Non Tender.    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 She prefers a lumpectomy in the left breast because she is having some discomfort. I have discussed with her the options for treatment on the right and  at this point she favors breast conservation which I feel is a good way of treating her cancer. She is also a candidate for sentinel node mapping. I have discussed with her in detail the risks and benefits of the surgery as well as some of the technical aspects and she understands and wishes to proceed. She will meet with medical and radiation oncology to discuss adjuvant therapy  Current Plans Pt Education - Breast Cancer: discussed with patient and provided information.

## 2018-11-25 NOTE — Anesthesia Procedure Notes (Signed)
Anesthesia Regional Block: Pectoralis block   Pre-Anesthetic Checklist: ,, timeout performed, Correct Patient, Correct Site, Correct Laterality, Correct Procedure, Correct Position, site marked, Risks and benefits discussed,  Surgical consent,  Pre-op evaluation,  At surgeon's request and post-op pain management  Laterality: Right  Prep: chloraprep       Needles:  Injection technique: Single-shot  Needle Type: Echogenic Needle     Needle Length: 10cm  Needle Gauge: 21     Additional Needles:   Narrative:  Start time: 11/25/2018 9:47 AM End time: 11/25/2018 9:51 AM Injection made incrementally with aspirations every 5 mL.  Performed by: Personally  Anesthesiologist: Audry Pili, MD  Additional Notes: No pain on injection. No increased resistance to injection. Injection made in 5cc increments. Good needle visualization. Patient tolerated the procedure well.

## 2018-11-25 NOTE — Anesthesia Preprocedure Evaluation (Addendum)
Anesthesia Evaluation  Patient identified by MRN, date of birth, ID band Patient awake    Reviewed: Allergy & Precautions, NPO status , Patient's Chart, lab work & pertinent test results  History of Anesthesia Complications Negative for: history of anesthetic complications  Airway Mallampati: II  TM Distance: >3 FB Neck ROM: Full    Dental  (+) Dental Advisory Given, Edentulous Upper   Pulmonary neg pulmonary ROS,    Pulmonary exam normal        Cardiovascular hypertension, Pt. on medications Normal cardiovascular exam   '17 Nuc Stress - Nuclear stress EF: 67%. There was no ST segment deviation noted during stress. The study is normal. This is a low risk study.    Neuro/Psych negative neurological ROS  negative psych ROS   GI/Hepatic Neg liver ROS, PUD, GERD  Medicated and Controlled,  Endo/Other  diabetes, Type 2, Oral Hypoglycemic Agents  Renal/GU CRFRenal disease     Musculoskeletal negative musculoskeletal ROS (+)   Abdominal   Peds  Hematology negative hematology ROS (+)   Anesthesia Other Findings Sarcoidosis  Reproductive/Obstetrics  Breast cancer                             Anesthesia Physical Anesthesia Plan  ASA: III  Anesthesia Plan: General   Post-op Pain Management:  Regional for Post-op pain   Induction: Intravenous  PONV Risk Score and Plan: 4 or greater and Treatment may vary due to age or medical condition, Ondansetron, Dexamethasone and Propofol infusion  Airway Management Planned: Oral ETT  Additional Equipment: None  Intra-op Plan:   Post-operative Plan: Extubation in OR  Informed Consent: I have reviewed the patients History and Physical, chart, labs and discussed the procedure including the risks, benefits and alternatives for the proposed anesthesia with the patient or authorized representative who has indicated his/her understanding and  acceptance.     Dental advisory given  Plan Discussed with: CRNA and Anesthesiologist  Anesthesia Plan Comments:        Anesthesia Quick Evaluation

## 2018-11-25 NOTE — Progress Notes (Signed)
Patient c/o of feeling anxious and feeling faint and "about to pass out". Elevated BP. Cool cloth placed on forehead as requested. Dr. Fransisco Beau notified. Verbal orders given. Will continue to monitor.

## 2018-11-25 NOTE — Progress Notes (Signed)
Notified Dr. Fransisco Beau of elevated blood pressure.  No orders given.  Will continue to monitor.

## 2018-11-25 NOTE — Transfer of Care (Signed)
Immediate Anesthesia Transfer of Care Note  Patient: Elizabeth Mcfarland  Procedure(s) Performed: BILATERAL BREAST LUMPECTOMIES  WITH BILATERAL  RADIOACTIVE SEEDS AND RIGHT SENTINEL LYMPH NODE BIOPSY (Bilateral Breast)  Patient Location: PACU  Anesthesia Type:General  Level of Consciousness: drowsy  Airway & Oxygen Therapy: Patient Spontanous Breathing and Patient connected to nasal cannula oxygen  Post-op Assessment: Report given to RN and Post -op Vital signs reviewed and stable  Post vital signs: Reviewed and stable  Last Vitals:  Vitals Value Taken Time  BP 172/95 11/25/18 1230  Temp    Pulse 76 11/25/18 1230  Resp 19 11/25/18 1230  SpO2 100 % 11/25/18 1230  Vitals shown include unvalidated device data.  Last Pain:  Vitals:   11/25/18 1025  TempSrc:   PainSc: 0-No pain         Complications: No apparent anesthesia complications

## 2018-11-26 ENCOUNTER — Encounter (HOSPITAL_COMMUNITY): Payer: Self-pay | Admitting: General Surgery

## 2018-12-01 ENCOUNTER — Telehealth: Payer: Self-pay | Admitting: *Deleted

## 2018-12-01 ENCOUNTER — Telehealth: Payer: Self-pay

## 2018-12-01 NOTE — Telephone Encounter (Signed)
Ordered oncotype per Dr. Feng.  Faxed requisition to pathology.   

## 2018-12-01 NOTE — Telephone Encounter (Signed)
Patient called and left a voicemail stating she just had breast cancer surgery and she is doing well. YRL,RMA

## 2018-12-02 ENCOUNTER — Other Ambulatory Visit: Payer: Self-pay | Admitting: Nurse Practitioner

## 2018-12-15 ENCOUNTER — Telehealth: Payer: Self-pay | Admitting: *Deleted

## 2018-12-15 DIAGNOSIS — C50211 Malignant neoplasm of upper-inner quadrant of right female breast: Secondary | ICD-10-CM

## 2018-12-15 NOTE — Telephone Encounter (Signed)
Received oncotype results of 21/7%. Patient is aware.  Referral placed for Dr. [name].

## 2018-12-17 ENCOUNTER — Encounter: Payer: Self-pay | Admitting: *Deleted

## 2018-12-22 ENCOUNTER — Other Ambulatory Visit: Payer: Self-pay

## 2018-12-22 ENCOUNTER — Encounter: Payer: Self-pay | Admitting: Radiation Oncology

## 2018-12-22 ENCOUNTER — Ambulatory Visit
Admission: RE | Admit: 2018-12-22 | Discharge: 2018-12-22 | Disposition: A | Discharge status: Home / Self Care | Payer: Medicare Other | Source: Ambulatory Visit | Attending: Radiation Oncology | Admitting: Radiation Oncology

## 2018-12-22 ENCOUNTER — Ambulatory Visit: Admission: RE | Admit: 2018-12-22 | Payer: Medicare Other | Source: Ambulatory Visit | Admitting: Radiation Oncology

## 2018-12-22 VITALS — BP 156/80 | HR 87 | Resp 18 | Ht 63.0 in | Wt 134.6 lb

## 2018-12-22 DIAGNOSIS — C50211 Malignant neoplasm of upper-inner quadrant of right female breast: Secondary | ICD-10-CM

## 2018-12-22 DIAGNOSIS — Z9889 Other specified postprocedural states: Secondary | ICD-10-CM | POA: Diagnosis not present

## 2018-12-22 DIAGNOSIS — Z170 Estrogen receptor positive status [ER+]: Secondary | ICD-10-CM

## 2018-12-22 DIAGNOSIS — Z79899 Other long term (current) drug therapy: Secondary | ICD-10-CM | POA: Insufficient documentation

## 2018-12-22 DIAGNOSIS — M25611 Stiffness of right shoulder, not elsewhere classified: Secondary | ICD-10-CM | POA: Diagnosis not present

## 2018-12-22 NOTE — Patient Instructions (Signed)
Coronavirus (COVID-19) Are you at risk?  Are you at risk for the Coronavirus (COVID-19)?  To be considered HIGH RISK for Coronavirus (COVID-19), you have to meet the following criteria:  . Traveled to [place], [place], [place], [place] or [place]; or in the [place] to [place], [place], [place], or [place]; and have fever, cough, and shortness of breath within the last 2 weeks of travel OR . Been in close contact with a person diagnosed with COVID-19 within the last 2 weeks and have fever, cough, and shortness of breath . IF YOU DO NOT MEET THESE CRITERIA, YOU ARE CONSIDERED LOW RISK FOR COVID-19.  What to do if you are HIGH RISK for COVID-19?  . If you are having a medical emergency, call 911. . Seek medical care right away. Before you go to a doctor's office, urgent care or emergency department, call ahead and tell them about your recent travel, contact with someone diagnosed with COVID-19, and your symptoms. You should receive instructions from your physician's office regarding next steps of care.  . When you arrive at healthcare provider, tell the healthcare staff immediately you have returned from visiting [place], [place], [place], [place] or [place]; or traveled in the [place] to [place], [place], [place], or [place]; in the last two weeks or you have been in close contact with a person diagnosed with COVID-19 in the last 2 weeks.   . Tell the health care staff about your symptoms: fever, cough and shortness of breath. . After you have been seen by a medical provider, you will be either: o Tested for (COVID-19) and discharged home on quarantine except to seek medical care if symptoms worsen, and asked to  - Stay home and avoid contact with others until you get your results (4-5 days)  - Avoid travel on public transportation if possible (such as bus, train, or airplane) or o Sent to the Emergency Department by EMS for evaluation, COVID-19 testing, and possible  admission depending on your condition and test results.  What to do if you are LOW RISK for COVID-19?  Reduce your risk of any infection by using the same precautions used for avoiding the common cold or flu:  . Wash your hands often with soap and warm water for at least 20 seconds.  If soap and water are not readily available, use an alcohol-based hand sanitizer with at least 60% alcohol.  . If coughing or sneezing, cover your mouth and nose by coughing or sneezing into the elbow areas of your shirt or coat, into a tissue or into your sleeve (not your hands). . Avoid shaking hands with others and consider head nods or verbal greetings only. . Avoid touching your eyes, nose, or mouth with unwashed hands.  . Avoid close contact with people who are sick. . Avoid places or events with large numbers of people in one location, like concerts or sporting events. . Carefully consider travel plans you have or are making. . If you are planning any travel outside or inside the [place], visit the CDC's Travelers' Health webpage for the latest health notices. . If you have some symptoms but not all symptoms, continue to monitor at home and seek medical attention if your symptoms worsen. . If you are having a medical emergency, call 911.   ADDITIONAL HEALTHCARE OPTIONS FOR PATIENTS  [facility] / e-Visit: [email or web address]         [facility]: [phone]  [facility]: [phone]                   [facility]: [phone]   

## 2018-12-22 NOTE — Progress Notes (Signed)
Radiation Oncology         [phone]) [phone] ________________________________  Name: Elizabeth Mcfarland MRN: [id number]  Date: 12/22/2018  DOB: Jun 18, 1940  Re-Evaluation Note  CC: [name], FNP  [name], MD    ICD-10-CM   1. Malignant neoplasm of upper-inner quadrant of right breast in female, estrogen receptor positive ([place])  C50.211    Z17.0     Diagnosis:   Stage IIA (pT2, pN0) Right Breast UIQ, Invasive Ductal Carcinoma, ER+ / PR+ / Her2-, Grade 2  Narrative:  The patient returns today to discuss radiation treatment options. She was seen in the multidisciplinary breast clinic on 10/13/2018.   She opted to proceed with bilateral lumpectomies with right axillary lymph node biopsies on 11/25/2018. Pathology from the procedure revealed: left-- fibroadenoma with calcifications; right-- invasive ductal carcinoma, 2.6 cm, grade 2; resection margins not involved; negative lymph nodes (0/3).  Oncotype DX was obtained on the final surgical sample and the recurrence score of 21 predicts a risk of recurrence outside the breast over the next 9 years of 7%, if the patient's only systemic therapy is an antiestrogen for 5 years.  It also predicts no benefit from chemotherapy.  On review of systems, the patient reports swelling and tightness in her axilla. She reports the axilla pain as a 5/10. She denies significant pain in her lumpectomy scar region and any other symptoms.  She did have some fluid drained from her axillary scar recently   Allergies:  is allergic to codeine and percocet [oxycodone-acetaminophen].  Meds: Current Outpatient Medications  Medication Sig Dispense Refill   acetaminophen (TYLENOL) 500 MG tablet Take 1,000 mg by mouth every 6 (six) hours as needed for moderate pain or headache.      amLODipine (NORVASC) 10 MG tablet TAKE 1 TABLET BY MOUTH EVERY DAY (Patient taking differently: Take 10 mg by mouth daily. ) 90 tablet 1   anastrozole (ARIMIDEX) 1 MG tablet Take 1  tablet (1 mg total) by mouth daily. 30 tablet 3   Ascorbic Acid (VITAMIN C) 1000 MG tablet Take 1,000 mg by mouth daily.     aspirin EC 81 MG tablet Take 81 mg by mouth daily.     cholecalciferol (VITAMIN D) 1000 UNITS tablet Take 1,000 Units by mouth daily.      glimepiride (AMARYL) 4 MG tablet TAKE 1 TABLET BY MOUTH TWICE DAILY 180 tablet 0   JANUMET 50-500 MG tablet TAKE 1 TABLET BY MOUTH EVERY DAY WITH MEALS (Patient taking differently: Take 1 tablet by mouth daily. ) 90 tablet 1   lansoprazole (PREVACID) 15 MG capsule Take 15 mg by mouth daily as needed (acid reflux).      Multiple Vitamin (MULTIVITAMIN WITH MINERALS) TABS tablet Take 1 tablet by mouth daily.      Polyethyl Glycol-Propyl Glycol (SYSTANE OP) Place 1 drop into both eyes 2 (two) times daily.      polyethylene glycol (MIRALAX / GLYCOLAX) packet Take 17 g by mouth daily.      HYDROcodone-acetaminophen (NORCO/VICODIN) 5-325 MG tablet Take 1-2 tablets by mouth every 6 (six) hours as needed for moderate pain or severe pain. (Patient not taking: Reported on 12/22/2018) 10 tablet 0   traMADol (ULTRAM) 50 MG tablet Take 1-2 tablets (50-100 mg total) by mouth every 6 (six) hours as needed. (Patient not taking: Reported on 12/22/2018) 15 tablet 0   No current facility-administered medications for this encounter.     Physical Findings: The patient is in no acute distress. Patient  is alert and oriented.  height is '5\' 3"'$  (1.6 m) and weight is 134 lb 9.6 oz (61.1 kg). Her blood pressure is 156/80 (abnormal) and her pulse is 87. Her respiration is 18 and oxygen saturation is 98%.  No significant changes. Lungs are clear to auscultation bilaterally. Heart has regular rate and rhythm. No palpable cervical, supraclavicular, or axillary adenopathy. Abdomen soft, non-tender, normal bowel sounds. left Breast: no palpable mass, nipple discharge or bleeding.  Lumpectomy scar healing well right Breast: Patient has a scar in the upper inner  quadrant which is healing well without signs of drainage or infection.  Patient also has a scar in the axillary region from her sentinel node procedure which shows a probable seroma underneath the scar estimated to be approximately 3 x 4 cm.  Patient has limited mobility of her right arm and shoulder and may have some possible cording along the right upper inner arm.  Lab Findings: Lab Results  Component Value Date   WBC 6.2 11/19/2018   HGB 12.1 11/19/2018   HCT 37.0 11/19/2018   MCV 91.6 11/19/2018   PLT 291 11/19/2018    Radiographic Findings: Nm Sentinel Node Inj-no Rpt (breast)  Result Date: 11/25/2018 Sulfur colloid was injected by the nuclear medicine technologist for melanoma sentinel node.   Mm Breast Surgical Specimen  Result Date: 11/25/2018 CLINICAL DATA:  Evaluate surgical specimen following lumpectomy for RIGHT breast cancer. EXAM: SPECIMEN RADIOGRAPH OF THE RIGHT BREAST COMPARISON:  Previous exam(s). FINDINGS: Status post excision of the RIGHT breast. The radioactive seed and biopsy marker clip are present, completely intact, and were marked for pathology. IMPRESSION: Specimen radiograph of the RIGHT breast. Electronically Signed   By: Margarette Canada M.D.   On: 11/25/2018 11:57   Mm Breast Surgical Specimen  Result Date: 11/25/2018 CLINICAL DATA:  Evaluate surgical specimen following excision of fibroadenoma with dystrophic calcifications in the LEFT breast. EXAM: SPECIMEN RADIOGRAPH OF THE LEFT BREAST COMPARISON:  Previous exam(s). FINDINGS: Status post excision of the LEFT breast. The radioactive seed and biopsy marker clip are present, completely intact, and were marked for pathology. IMPRESSION: Specimen radiograph of the LEFT breast. Electronically Signed   By: Margarette Canada M.D.   On: 11/25/2018 11:15   Mm Lt Radioactive Seed Loc Mammo Guide  Result Date: 11/24/2018 CLINICAL DATA:  Biopsy proven invasive mammary carcinoma in the right breast and a fibroadenoma with dystrophic  calcifications in the left breast. Patient request excision of the fibroadenoma in the left breast. EXAM: MAMMOGRAPHIC GUIDED RADIOACTIVE SEED LOCALIZATION OF THE BILATERAL BREAST COMPARISON:  Previous exam(s). FINDINGS: Patient presents for radioactive seed localization prior to surgery. I met with the patient and we discussed the procedure of seed localization including benefits and alternatives. We discussed the high likelihood of a successful procedure. We discussed the risks of the procedure including infection, bleeding, tissue injury and further surgery. We discussed the low dose of radioactivity involved in the procedure. Informed, written consent was given. The usual time-out protocol was performed immediately prior to the procedure. Using mammographic guidance, sterile technique, 1% lidocaine and an I-125 radioactive seed, the ribbon shaped clip in the UPPER-INNER QUADRANT OF THE RIGHT BREAST was localized using a medial to lateral approach. The follow-up mammogram images confirm the seed in the expected location and were marked for Dr. [name]. Follow-up survey of the patient confirms presence of the radioactive seed. Order number of I-125 seed:  [id number]. Total activity:  9.892 millicuries reference Date: 11/22/2018 The patient tolerated the procedure  well and was released from the [facility]. She was given instructions regarding seed removal. Patient presents for radioactive seed localization prior to surgery. I met with the patient and we discussed the procedure of seed localization including benefits and alternatives. We discussed the high likelihood of a successful procedure. We discussed the risks of the procedure including infection, bleeding, tissue injury and further surgery. We discussed the low dose of radioactivity involved in the procedure. Informed, written consent was given. The usual time-out protocol was performed immediately prior to the procedure. Using mammographic guidance, sterile  technique, 1% lidocaine and an I-125 radioactive seed, the ribbon shaped clip in the [place] was localized using a medial to lateral approach. The follow-up mammogram images confirm the seed in the expected location and were marked for Dr. [name]. Follow-up survey of the patient confirms presence of the radioactive seed. Order number of I-125 seed:  [id number]. Total activity:  9.924 millicuries reference Date: 11/22/2018 The patient tolerated the procedure well and was released from the [facility]. She was given instructions regarding seed removal. IMPRESSION: Radioactive seed localization bilateral breast. No apparent complications. Electronically Signed   By: Lillia Mountain M.D.   On: 11/24/2018 14:18   Mm Rt Radioactive Seed Loc Mammo Guide  Result Date: 11/24/2018 CLINICAL DATA:  Biopsy proven invasive mammary carcinoma in the right breast and a fibroadenoma with dystrophic calcifications in the left breast. Patient request excision of the fibroadenoma in the left breast. EXAM: MAMMOGRAPHIC GUIDED RADIOACTIVE SEED LOCALIZATION OF THE BILATERAL BREAST COMPARISON:  Previous exam(s). FINDINGS: Patient presents for radioactive seed localization prior to surgery. I met with the patient and we discussed the procedure of seed localization including benefits and alternatives. We discussed the high likelihood of a successful procedure. We discussed the risks of the procedure including infection, bleeding, tissue injury and further surgery. We discussed the low dose of radioactivity involved in the procedure. Informed, written consent was given. The usual time-out protocol was performed immediately prior to the procedure. Using mammographic guidance, sterile technique, 1% lidocaine and an I-125 radioactive seed, the ribbon shaped clip in the UPPER-INNER QUADRANT OF THE RIGHT BREAST was localized using a medial to lateral approach. The follow-up mammogram images confirm the seed in the  expected location and were marked for Dr. [name]. Follow-up survey of the patient confirms presence of the radioactive seed. Order number of I-125 seed:  [id number]. Total activity:  2.297 millicuries reference Date: 11/22/2018 The patient tolerated the procedure well and was released from the [facility]. She was given instructions regarding seed removal. Patient presents for radioactive seed localization prior to surgery. I met with the patient and we discussed the procedure of seed localization including benefits and alternatives. We discussed the high likelihood of a successful procedure. We discussed the risks of the procedure including infection, bleeding, tissue injury and further surgery. We discussed the low dose of radioactivity involved in the procedure. Informed, written consent was given. The usual time-out protocol was performed immediately prior to the procedure. Using mammographic guidance, sterile technique, 1% lidocaine and an I-125 radioactive seed, the ribbon shaped clip in the [place] was localized using a medial to lateral approach. The follow-up mammogram images confirm the seed in the expected location and were marked for Dr. [name]. Follow-up survey of the patient confirms presence of the radioactive seed. Order number of I-125 seed:  [id number]. Total activity:  7.408 millicuries reference Date: 11/22/2018 The patient  tolerated the procedure well and was [facility]. She was given instructions regarding seed removal. IMPRESSION: Radioactive seed localization bilateral breast. No apparent complications. Electronically Signed   By: Lillia Mountain M.D.   On: 11/24/2018 14:18    Impression:  Stage IIA (pT2, pN0) Right Breast UIQ, Invasive Ductal Carcinoma, ER+ / PR+ / Her2-, Grade 2.  Patient will be a good candidate for breast conservation with radiation therapy directed at the right breast.  Given the size of the tumor as well as the close  surgical margin, less than 1 mm, I would recommend radiation therapy despite her age of 58.  Patient does wish to proceed with radiation therapy as part of her overall management.  Patient may benefit from physical therapy concerning her right arm mobility and possible cording in the right upper arm.  Patient will be seeing Dr. [name] next week and she will be discussing these issues with him.   Plan:  Patient is scheduled for CT simulation on 01/03/2019 at 9 am. She would appear to be a good candidate for hypofractionated accelerated radiation therapy over approximately 4 weeks.  ____________________________________ [name], MD   This document serves as a record of services personally performed by [name], MD. It was created on his behalf by Wilburn Mylar, a trained medical scribe. The creation of this record is based on the scribe's personal observations and the provider's statements to them. This document has been checked and approved by the attending provider.

## 2018-12-22 NOTE — Progress Notes (Signed)
Location of Breast Cancer: Malignant neoplasm of upper-inner quadrant of right breast in female, estrogen receptor positive ([place]).  Histology per Pathology Report: 11/25/18:  Diagnosis 1. Breast, lumpectomy, Left w/seed - FIBROADENOMA WITH CALCIFICATIONS. - BIOPSY SITE CHANGES. 2. Lymph node, sentinel, biopsy, Right Axillary #1 - ONE LYMPH NODE, NEGATIVE FOR CARCINOMA (0/1). 3. Lymph node, sentinel, biopsy, Right Axillary #2 - ONE LYMPH NODE, NEGATIVE FOR CARCINOMA (0/1). 4. Lymph node, sentinel, biopsy, Right - ONE LYMPH NODE, NEGATIVE FOR CARCINOMA (0/1). 5. Breast, lumpectomy, Right w/seed - INVASIVE DUCTAL CARCINOMA, 2.6 CM, NOTTINGHAM GRADE 2 OF 3. - MARGINS OF RESECTION ARE NOT INVOLVED (CLOSEST MARGIN: LESS THAN 1 MM, SUPERIOR). - BIOPSY SITE CHANGES. - SEE ONCOLOGY TABLE. 6. Medical device, removal, radioactive seed - RADIOACTIVE SEED. - GROSS ONLY.  Receptor Status: ER(100%), PR (70%), Her2-neu (negative), Ki-(10%)  Did patient present with symptoms (if so, please note symptoms) or was this found on screening mammography?: She had routine screening mammography on 10/01/2018 showing a possible abnormality in both breasts. She underwent bilateral diagnostic mammography with tomography and bilateral breast ultrasonography at [facility] on 10/05/2018 showing: highly suspicious right breast mass; indeterminate left breast mass; no suspicious lymphadenopathy bilaterally.  Biopsy on 10/07/2018 showed: left breast with fibroadenoma with dystrophic calcifications; right breast with invasive mammary carcinoma with calcifications, e-cadherin positive for ductal phenotype, grade not determined. Prognostic indicators significant for: estrogen receptor, 100% positive and progesterone receptor, 70% positive, both with strong staining intensity. Proliferation marker Ki67 at 10%. HER2 equivocal by immunohistochemistry. FISH results still pending.  Past/Anticipated interventions by surgeon, if  any: 11/25/18: PROCEDURE:  Procedure(s): BILATERAL BREAST LUMPECTOMIES  WITH BILATERAL  RADIOACTIVE SEEDS AND RIGHT DEEP AXILLARY SENTINEL LYMPH NODE BIOPSY (Bilateral)  SURGEON:  Surgeon(s) and Role:    [name], MD - Primary  Past/Anticipated interventions by medical oncology, if any: Chemotherapy 10/18/18:Pt is scheduled for surgery on 11/25/2018. I called pt, recommend her to start Anastrozole while she is waiting for surgery. She agrees and is happy to start. I reviewed the potential side effects with her again, she voiced good understanding, and will call me if she experiences significant side effects. She knows to continue anastrozole after surgery.  [name]  10/18/2018    Lymphedema issues, if any:  Pt reports swelling and tightness in axilla. Pt reports pain 5/10.  Pain issues, if any:  Pt reports pain in axilla, rated 5/10.   SAFETY ISSUES:  Prior radiation? No  Pacemaker/ICD? No  Possible current pregnancy? No  Is the patient on methotrexate? No  Current Complaints / other details:  Pt presents today for consult with Dr. [name] for Radiation Oncology.  BP (!) 156/80 (BP Location: Left Arm, Patient Position: Sitting)   Pulse 87   Resp 18   Ht '5\' 3"'  (1.6 m)   Wt 134 lb 9.6 oz (61.1 kg)   SpO2 98%   BMI 23.84 kg/m   Wt Readings from Last 3 Encounters:  12/22/18 134 lb 9.6 oz (61.1 kg)  11/25/18 137 lb 3.2 oz (62.2 kg)  11/19/18 137 lb 3.2 oz (62.2 kg)       Loma Sousa, RN 12/22/2018,11:04 AM

## 2018-12-27 ENCOUNTER — Encounter: Payer: Self-pay | Admitting: Hematology

## 2018-12-30 ENCOUNTER — Telehealth: Payer: Self-pay

## 2018-12-30 NOTE — Telephone Encounter (Signed)
I called patient to see if she is willing to take a statin medication once weekly to help decrease her risk for cardiac disease. Left pt v/m to call the office [facility]

## 2019-01-03 ENCOUNTER — Ambulatory Visit: Payer: Medicare Other | Admitting: Radiation Oncology

## 2019-01-03 ENCOUNTER — Encounter: Payer: Self-pay | Admitting: *Deleted

## 2019-01-04 ENCOUNTER — Ambulatory Visit
Admission: RE | Admit: 2019-01-04 | Discharge: 2019-01-04 | Disposition: A | Discharge status: Home / Self Care | Payer: Medicare Other | Source: Ambulatory Visit | Attending: Radiation Oncology | Admitting: Radiation Oncology

## 2019-01-04 ENCOUNTER — Telehealth: Payer: Self-pay | Admitting: Hematology

## 2019-01-04 DIAGNOSIS — C50211 Malignant neoplasm of upper-inner quadrant of right female breast: Secondary | ICD-10-CM | POA: Insufficient documentation

## 2019-01-04 DIAGNOSIS — Z170 Estrogen receptor positive status [ER+]: Secondary | ICD-10-CM | POA: Insufficient documentation

## 2019-01-04 DIAGNOSIS — Z510 Encounter for antineoplastic radiation therapy: Secondary | ICD-10-CM | POA: Diagnosis not present

## 2019-01-04 NOTE — Telephone Encounter (Signed)
Scheduled appt per 10/12 sch message - mailed reminder letter with apt date and time

## 2019-01-05 ENCOUNTER — Ambulatory Visit (INDEPENDENT_AMBULATORY_CARE_PROVIDER_SITE_OTHER): Payer: Medicare Other | Admitting: Nurse Practitioner

## 2019-01-05 ENCOUNTER — Other Ambulatory Visit: Payer: Self-pay

## 2019-01-05 ENCOUNTER — Encounter: Payer: Self-pay | Admitting: Nurse Practitioner

## 2019-01-05 VITALS — BP 122/68 | HR 74 | Temp 98.9°F | Ht 62.8 in | Wt 133.4 lb

## 2019-01-05 DIAGNOSIS — E782 Mixed hyperlipidemia: Secondary | ICD-10-CM

## 2019-01-05 DIAGNOSIS — I10 Essential (primary) hypertension: Secondary | ICD-10-CM

## 2019-01-05 DIAGNOSIS — E118 Type 2 diabetes mellitus with unspecified complications: Secondary | ICD-10-CM

## 2019-01-05 DIAGNOSIS — E1165 Type 2 diabetes mellitus with hyperglycemia: Secondary | ICD-10-CM | POA: Diagnosis not present

## 2019-01-05 DIAGNOSIS — IMO0002 Reserved for concepts with insufficient information to code with codable children: Secondary | ICD-10-CM

## 2019-01-05 DIAGNOSIS — K219 Gastro-esophageal reflux disease without esophagitis: Secondary | ICD-10-CM

## 2019-01-05 DIAGNOSIS — Z23 Encounter for immunization: Secondary | ICD-10-CM | POA: Diagnosis not present

## 2019-01-05 DIAGNOSIS — R002 Palpitations: Secondary | ICD-10-CM

## 2019-01-05 MED ORDER — JANUMET 50-500 MG PO TABS: 1.0000 | ORAL_TABLET | Freq: Every day | ORAL | 1 refills | Status: DC

## 2019-01-05 NOTE — Progress Notes (Signed)
Subjective:     Patient ID: Elizabeth Mcfarland , female    DOB: 05-19-1940 , 78 y.o.   MRN: [id number]   Chief Complaint  Patient presents with  . Diabetes    HPI  She is scheduled for biopsy today at 1:45pm on both breast due to an abnormal mammogram.  No problems with her breast in the past. No family history of breast cancer.    Denies any pain.    She is starting radiation on Tuesday for her breast cancer.  She had a cyst removed from left breast and right breast cancer removed with one node.  Reports "I just don't feel well"   Diabetes She presents for her follow-up diabetic visit. She has type 2 diabetes mellitus. Her disease course has been stable. There are no hypoglycemic associated symptoms. Pertinent negatives for hypoglycemia include no dizziness or headaches. There are no diabetic associated symptoms. Pertinent negatives for diabetes include no chest pain. There are no hypoglycemic complications. There are no diabetic complications. There are no known risk factors for coronary artery disease. There is no change in her home blood glucose trend. (Blood sugar 90-110.  She will take a 1/2 tab of metformin at night and if she takes a whole tab it will be 77 she will feel bad.  )  Hypertension Pertinent negatives include no chest pain, headaches or palpitations.  Gastroesophageal Reflux She complains of heartburn. She reports no chest pain, no coughing, no dysphagia or no sore throat. This is a recurrent (She had taken a break from her prevacid) problem. The problem has been gradually worsening. The heartburn does not wake her from sleep. The heartburn does not limit her activity. Pertinent negatives include no anemia. She has tried a histamine-2 antagonist for the symptoms. Past procedures do not include an abdominal ultrasound.     Past Medical History:  Diagnosis Date  . Allergy   . Anemia   . Blood transfusion without reported diagnosis   . Cancer ([place])   . Cataract   .  Chronic kidney disease   . Diabetes mellitus without complication ([place])   . Esophageal ulcer with bleeding 05/05/2013   05/05/2013 EGD linear distal esophageal ulcer - cause of hematemesis prior to EGD   . GERD (gastroesophageal reflux disease)   . History of kidney stones   . Hyperlipidemia   . Hypertension   . Kidney cysts   . Sarcoidosis 1970     Family History  Problem Relation Age of Onset  . Diabetes Sister   . Kidney failure Sister   . Colon polyps Sister   . CAD Maternal Grandmother   . Early death Mother   . Stroke Father   . Stroke Brother   . Colon polyps Brother   . Cancer Brother 2       prostate cancer  . Heart disease Brother   . Emphysema Brother   . Cancer Brother 47       prostate cancer  . Asthma Brother   . Breast cancer Neg Hx   . Colon cancer Neg Hx   . Esophageal cancer Neg Hx   . Rectal cancer Neg Hx   . Stomach cancer Neg Hx      Current Outpatient Medications:  .  acetaminophen (TYLENOL) 500 MG tablet, Take 1,000 mg by mouth every 6 (six) hours as needed for moderate pain or headache. , Disp: , Rfl:  .  amLODipine (NORVASC) 10 MG tablet, TAKE 1 TABLET BY MOUTH EVERY  DAY (Patient taking differently: Take 10 mg by mouth daily. ), Disp: 90 tablet, Rfl: 1 .  anastrozole (ARIMIDEX) 1 MG tablet, Take 1 tablet (1 mg total) by mouth daily., Disp: 30 tablet, Rfl: 3 .  Ascorbic Acid (VITAMIN C) 1000 MG tablet, Take 1,000 mg by mouth daily., Disp: , Rfl:  .  aspirin EC 81 MG tablet, Take 81 mg by mouth daily., Disp: , Rfl:  .  cholecalciferol (VITAMIN D) 1000 UNITS tablet, Take 1,000 Units by mouth daily. , Disp: , Rfl:  .  glimepiride (AMARYL) 4 MG tablet, TAKE 1 TABLET BY MOUTH TWICE DAILY, Disp: 180 tablet, Rfl: 0 .  JANUMET 50-500 MG tablet, TAKE 1 TABLET BY MOUTH EVERY DAY WITH MEALS (Patient taking differently: Take 1 tablet by mouth daily. ), Disp: 90 tablet, Rfl: 1 .  lansoprazole (PREVACID) 15 MG capsule, Take 15 mg by mouth daily as needed (acid  reflux). , Disp: , Rfl:  .  Multiple Vitamin (MULTIVITAMIN WITH MINERALS) TABS tablet, Take 1 tablet by mouth daily. , Disp: , Rfl:  .  Polyethyl Glycol-Propyl Glycol (SYSTANE OP), Place 1 drop into both eyes 2 (two) times daily. , Disp: , Rfl:  .  polyethylene glycol (MIRALAX / GLYCOLAX) packet, Take 17 g by mouth daily. , Disp: , Rfl:  .  HYDROcodone-acetaminophen (NORCO/VICODIN) 5-325 MG tablet, Take 1-2 tablets by mouth every 6 (six) hours as needed for moderate pain or severe pain. (Patient not taking: Reported on 01/05/2019), Disp: 10 tablet, Rfl: 0 .  traMADol (ULTRAM) 50 MG tablet, Take 1-2 tablets (50-100 mg total) by mouth every 6 (six) hours as needed. (Patient not taking: Reported on 01/05/2019), Disp: 15 tablet, Rfl: 0   Allergies  Allergen Reactions  . Codeine Nausea And Vomiting  . Percocet [Oxycodone-Acetaminophen] Nausea And Vomiting     Review of Systems  Constitutional: Negative.   HENT: Negative for sore throat.   Respiratory: Negative.  Negative for cough.   Cardiovascular: Negative.  Negative for chest pain, palpitations and leg swelling.  Gastrointestinal: Positive for heartburn. Negative for dysphagia.  Musculoskeletal: Negative.   Neurological: Negative for dizziness and headaches.  Psychiatric/Behavioral: Negative.      Today's Vitals   01/05/19 0931  BP: 122/68  Pulse: 74  Temp: 98.9 F (37.2 C)  TempSrc: Oral  Weight: 133 lb 6.4 oz (60.5 kg)  Height: 5' 2.8" (1.595 m)  PainSc: 0-No pain   Body mass index is 23.78 kg/m.   Objective:  Physical Exam Vitals signs reviewed.  Constitutional:      General: She is not in acute distress.    Appearance: Normal appearance.  Cardiovascular:     Rate and Rhythm: Normal rate and regular rhythm.     Pulses: Normal pulses.     Heart sounds: Normal heart sounds. No murmur.  Pulmonary:     Effort: Pulmonary effort is normal. No respiratory distress.     Breath sounds: Normal breath sounds.  Skin:     General: Skin is warm and dry.     Capillary Refill: Capillary refill takes less than 2 seconds.     Findings: No rash.  Neurological:     General: No focal deficit present.     Mental Status: She is alert and oriented to person, place, and time.  Psychiatric:        Mood and Affect: Mood normal.        Behavior: Behavior normal.        Thought Content: Thought  content normal.        Judgment: Judgment normal.         Assessment And Plan:      1. Type II diabetes mellitus with manifestations, uncontrolled (HCC) Chronic, controlled Continue with current medications Encouraged to limit intake of sugary foods and drinks Encouraged to increase physical activity to 150 minutes per week as tolerated - sitaGLIPtin-metformin (JANUMET) 50-500 MG tablet; Take 1 tablet by mouth daily.  Dispense: 90 tablet; Refill: 1  2. Essential hypertension . B/P is well controlled.  . She has had a BMP in August 2020 . The importance of regular exercise and dietary modification was stressed to the patient.  . Stressed importance of losing ten percent of her body weight to help with B/P control.  . The weight loss would help with decreasing cardiac and cancer risk as well.   3. Need for influenza vaccination  Influenza vaccine given in office  Advised to take Tylenol as needed for muscle aches or fever - Flu vaccine HIGH DOSE PF (Fluzone High dose)  4. Mixed hyperlipidemia  Chronic, controlled  Continue with current medications  5. Gastroesophageal reflux disease without esophagitis  Chronic with lansoprazole as needed  6. Palpitations  Intermittent palpitations, EKG revealed NSR.    Encouraged to stay well hydrated with water.  - EKG 12-Lead      [name], FNP    THE PATIENT IS ENCOURAGED TO PRACTICE SOCIAL DISTANCING DUE TO THE COVID-19 PANDEMIC.

## 2019-01-06 DIAGNOSIS — Z170 Estrogen receptor positive status [ER+]: Secondary | ICD-10-CM | POA: Diagnosis not present

## 2019-01-06 DIAGNOSIS — C50211 Malignant neoplasm of upper-inner quadrant of right female breast: Secondary | ICD-10-CM | POA: Diagnosis not present

## 2019-01-06 DIAGNOSIS — Z510 Encounter for antineoplastic radiation therapy: Secondary | ICD-10-CM | POA: Diagnosis not present

## 2019-01-06 LAB — HEMOGLOBIN A1C
Est. average glucose Bld gHb Est-mCnc: 126 mg/dL
Hgb A1c MFr Bld: 6 % — ABNORMAL HIGH (ref 4.8–5.6)

## 2019-01-10 ENCOUNTER — Ambulatory Visit: Payer: Medicare Other | Admitting: Nurse Practitioner

## 2019-01-11 ENCOUNTER — Ambulatory Visit
Admission: RE | Admit: 2019-01-11 | Discharge: 2019-01-11 | Disposition: A | Discharge status: Home / Self Care | Payer: Medicare Other | Source: Ambulatory Visit | Attending: Radiation Oncology | Admitting: Radiation Oncology

## 2019-01-11 ENCOUNTER — Other Ambulatory Visit: Payer: Self-pay

## 2019-01-11 DIAGNOSIS — Z170 Estrogen receptor positive status [ER+]: Secondary | ICD-10-CM | POA: Diagnosis not present

## 2019-01-11 DIAGNOSIS — C50211 Malignant neoplasm of upper-inner quadrant of right female breast: Secondary | ICD-10-CM

## 2019-01-11 DIAGNOSIS — Z510 Encounter for antineoplastic radiation therapy: Secondary | ICD-10-CM | POA: Diagnosis not present

## 2019-01-11 MED ORDER — RADIAPLEXRX EX GEL
Freq: Once | CUTANEOUS | Status: AC
  Administered 2019-01-11: 16:00:00 via TOPICAL

## 2019-01-11 MED ORDER — ALRA NON-METALLIC DEODORANT (RAD-ONC)
1.0000 "application " | Freq: Once | TOPICAL | Status: AC
  Administered 2019-01-11: 1 via TOPICAL

## 2019-01-12 ENCOUNTER — Other Ambulatory Visit: Payer: Self-pay

## 2019-01-12 ENCOUNTER — Ambulatory Visit
Admission: RE | Admit: 2019-01-12 | Discharge: 2019-01-12 | Disposition: A | Discharge status: Home / Self Care | Payer: Medicare Other | Source: Ambulatory Visit | Attending: Radiation Oncology | Admitting: Radiation Oncology

## 2019-01-12 DIAGNOSIS — Z170 Estrogen receptor positive status [ER+]: Secondary | ICD-10-CM | POA: Diagnosis not present

## 2019-01-12 DIAGNOSIS — C50211 Malignant neoplasm of upper-inner quadrant of right female breast: Secondary | ICD-10-CM | POA: Diagnosis not present

## 2019-01-12 DIAGNOSIS — Z510 Encounter for antineoplastic radiation therapy: Secondary | ICD-10-CM | POA: Diagnosis not present

## 2019-01-13 ENCOUNTER — Ambulatory Visit
Admission: RE | Admit: 2019-01-13 | Discharge: 2019-01-13 | Disposition: A | Discharge status: Home / Self Care | Payer: Medicare Other | Source: Ambulatory Visit | Attending: Radiation Oncology | Admitting: Radiation Oncology

## 2019-01-13 ENCOUNTER — Other Ambulatory Visit: Payer: Self-pay

## 2019-01-13 DIAGNOSIS — Z170 Estrogen receptor positive status [ER+]: Secondary | ICD-10-CM | POA: Diagnosis not present

## 2019-01-13 DIAGNOSIS — Z510 Encounter for antineoplastic radiation therapy: Secondary | ICD-10-CM | POA: Diagnosis not present

## 2019-01-13 DIAGNOSIS — C50211 Malignant neoplasm of upper-inner quadrant of right female breast: Secondary | ICD-10-CM | POA: Diagnosis not present

## 2019-01-14 ENCOUNTER — Other Ambulatory Visit: Payer: Self-pay

## 2019-01-14 ENCOUNTER — Ambulatory Visit
Admission: RE | Admit: 2019-01-14 | Discharge: 2019-01-14 | Disposition: A | Discharge status: Home / Self Care | Payer: Medicare Other | Source: Ambulatory Visit | Attending: Radiation Oncology | Admitting: Radiation Oncology

## 2019-01-14 DIAGNOSIS — C50211 Malignant neoplasm of upper-inner quadrant of right female breast: Secondary | ICD-10-CM | POA: Diagnosis not present

## 2019-01-14 DIAGNOSIS — Z170 Estrogen receptor positive status [ER+]: Secondary | ICD-10-CM | POA: Diagnosis not present

## 2019-01-14 DIAGNOSIS — Z510 Encounter for antineoplastic radiation therapy: Secondary | ICD-10-CM | POA: Diagnosis not present

## 2019-01-17 ENCOUNTER — Other Ambulatory Visit: Payer: Self-pay

## 2019-01-17 ENCOUNTER — Ambulatory Visit
Admission: RE | Admit: 2019-01-17 | Discharge: 2019-01-17 | Disposition: A | Discharge status: Home / Self Care | Payer: Medicare Other | Source: Ambulatory Visit | Attending: Radiation Oncology | Admitting: Radiation Oncology

## 2019-01-17 DIAGNOSIS — C50211 Malignant neoplasm of upper-inner quadrant of right female breast: Secondary | ICD-10-CM | POA: Diagnosis not present

## 2019-01-17 DIAGNOSIS — Z170 Estrogen receptor positive status [ER+]: Secondary | ICD-10-CM | POA: Diagnosis not present

## 2019-01-17 DIAGNOSIS — Z510 Encounter for antineoplastic radiation therapy: Secondary | ICD-10-CM | POA: Diagnosis not present

## 2019-01-18 ENCOUNTER — Ambulatory Visit
Admission: RE | Admit: 2019-01-18 | Discharge: 2019-01-18 | Disposition: A | Discharge status: Home / Self Care | Payer: Medicare Other | Source: Ambulatory Visit | Attending: Radiation Oncology | Admitting: Radiation Oncology

## 2019-01-18 ENCOUNTER — Other Ambulatory Visit: Payer: Self-pay

## 2019-01-18 DIAGNOSIS — C50211 Malignant neoplasm of upper-inner quadrant of right female breast: Secondary | ICD-10-CM | POA: Diagnosis not present

## 2019-01-18 DIAGNOSIS — Z510 Encounter for antineoplastic radiation therapy: Secondary | ICD-10-CM | POA: Diagnosis not present

## 2019-01-18 DIAGNOSIS — Z170 Estrogen receptor positive status [ER+]: Secondary | ICD-10-CM | POA: Diagnosis not present

## 2019-01-19 ENCOUNTER — Other Ambulatory Visit: Payer: Self-pay

## 2019-01-19 ENCOUNTER — Ambulatory Visit
Admission: RE | Admit: 2019-01-19 | Discharge: 2019-01-19 | Disposition: A | Discharge status: Home / Self Care | Payer: Medicare Other | Source: Ambulatory Visit | Attending: Radiation Oncology | Admitting: Radiation Oncology

## 2019-01-19 DIAGNOSIS — Z170 Estrogen receptor positive status [ER+]: Secondary | ICD-10-CM | POA: Diagnosis not present

## 2019-01-19 DIAGNOSIS — Z510 Encounter for antineoplastic radiation therapy: Secondary | ICD-10-CM | POA: Diagnosis not present

## 2019-01-19 DIAGNOSIS — C50211 Malignant neoplasm of upper-inner quadrant of right female breast: Secondary | ICD-10-CM | POA: Diagnosis not present

## 2019-01-20 ENCOUNTER — Other Ambulatory Visit: Payer: Self-pay

## 2019-01-20 ENCOUNTER — Ambulatory Visit
Admission: RE | Admit: 2019-01-20 | Discharge: 2019-01-20 | Disposition: A | Discharge status: Home / Self Care | Payer: Medicare Other | Source: Ambulatory Visit | Attending: Radiation Oncology | Admitting: Radiation Oncology

## 2019-01-20 DIAGNOSIS — Z510 Encounter for antineoplastic radiation therapy: Secondary | ICD-10-CM | POA: Diagnosis not present

## 2019-01-20 DIAGNOSIS — C50211 Malignant neoplasm of upper-inner quadrant of right female breast: Secondary | ICD-10-CM | POA: Diagnosis not present

## 2019-01-20 DIAGNOSIS — Z170 Estrogen receptor positive status [ER+]: Secondary | ICD-10-CM | POA: Diagnosis not present

## 2019-01-21 ENCOUNTER — Ambulatory Visit
Admission: RE | Admit: 2019-01-21 | Discharge: 2019-01-21 | Disposition: A | Discharge status: Home / Self Care | Payer: Medicare Other | Source: Ambulatory Visit | Attending: Radiation Oncology | Admitting: Radiation Oncology

## 2019-01-21 ENCOUNTER — Other Ambulatory Visit: Payer: Self-pay

## 2019-01-21 DIAGNOSIS — C50211 Malignant neoplasm of upper-inner quadrant of right female breast: Secondary | ICD-10-CM | POA: Diagnosis not present

## 2019-01-21 DIAGNOSIS — Z510 Encounter for antineoplastic radiation therapy: Secondary | ICD-10-CM | POA: Diagnosis not present

## 2019-01-21 DIAGNOSIS — Z170 Estrogen receptor positive status [ER+]: Secondary | ICD-10-CM | POA: Diagnosis not present

## 2019-01-24 ENCOUNTER — Ambulatory Visit
Admission: RE | Admit: 2019-01-24 | Discharge: 2019-01-24 | Disposition: A | Discharge status: Home / Self Care | Payer: Medicare Other | Source: Ambulatory Visit | Attending: Radiation Oncology | Admitting: Radiation Oncology

## 2019-01-24 ENCOUNTER — Other Ambulatory Visit: Payer: Self-pay

## 2019-01-24 DIAGNOSIS — Z510 Encounter for antineoplastic radiation therapy: Secondary | ICD-10-CM | POA: Diagnosis not present

## 2019-01-24 DIAGNOSIS — C50211 Malignant neoplasm of upper-inner quadrant of right female breast: Secondary | ICD-10-CM | POA: Insufficient documentation

## 2019-01-24 DIAGNOSIS — Z170 Estrogen receptor positive status [ER+]: Secondary | ICD-10-CM | POA: Diagnosis not present

## 2019-01-25 ENCOUNTER — Other Ambulatory Visit: Payer: Self-pay

## 2019-01-25 ENCOUNTER — Ambulatory Visit
Admission: RE | Admit: 2019-01-25 | Discharge: 2019-01-25 | Disposition: A | Discharge status: Home / Self Care | Payer: Medicare Other | Source: Ambulatory Visit | Attending: Radiation Oncology | Admitting: Radiation Oncology

## 2019-01-25 DIAGNOSIS — Z170 Estrogen receptor positive status [ER+]: Secondary | ICD-10-CM

## 2019-01-25 DIAGNOSIS — C50211 Malignant neoplasm of upper-inner quadrant of right female breast: Secondary | ICD-10-CM | POA: Diagnosis not present

## 2019-01-25 DIAGNOSIS — Z510 Encounter for antineoplastic radiation therapy: Secondary | ICD-10-CM | POA: Diagnosis not present

## 2019-01-25 NOTE — Progress Notes (Signed)
.  Simulation verification  The patient was brought to the treatment machine and placed in the plan treatment position.  Clinical set up was verified to ensure that the target region is appropriately covered for the patient's upcoming electron boost treatment.  The targeted volume of tissue is appropriately covered by the radiation field.  Based on my personal review, I approve the simulation verification.  The patient's treatment will proceed as planned.  ------------------------------------------------  -----------------------------------  Stein Windhorst D. Anniemae Haberkorn, PhD, MD  

## 2019-01-26 ENCOUNTER — Other Ambulatory Visit: Payer: Self-pay

## 2019-01-26 ENCOUNTER — Ambulatory Visit
Admission: RE | Admit: 2019-01-26 | Discharge: 2019-01-26 | Disposition: A | Discharge status: Home / Self Care | Payer: Medicare Other | Source: Ambulatory Visit | Attending: Radiation Oncology | Admitting: Radiation Oncology

## 2019-01-26 DIAGNOSIS — Z510 Encounter for antineoplastic radiation therapy: Secondary | ICD-10-CM | POA: Diagnosis not present

## 2019-01-26 DIAGNOSIS — C50211 Malignant neoplasm of upper-inner quadrant of right female breast: Secondary | ICD-10-CM | POA: Diagnosis not present

## 2019-01-26 DIAGNOSIS — Z170 Estrogen receptor positive status [ER+]: Secondary | ICD-10-CM | POA: Diagnosis not present

## 2019-01-27 ENCOUNTER — Other Ambulatory Visit: Payer: Self-pay

## 2019-01-27 ENCOUNTER — Ambulatory Visit
Admission: RE | Admit: 2019-01-27 | Discharge: 2019-01-27 | Disposition: A | Discharge status: Home / Self Care | Payer: Medicare Other | Source: Ambulatory Visit | Attending: Radiation Oncology | Admitting: Radiation Oncology

## 2019-01-27 DIAGNOSIS — C50211 Malignant neoplasm of upper-inner quadrant of right female breast: Secondary | ICD-10-CM | POA: Diagnosis not present

## 2019-01-27 DIAGNOSIS — Z170 Estrogen receptor positive status [ER+]: Secondary | ICD-10-CM | POA: Diagnosis not present

## 2019-01-27 DIAGNOSIS — Z510 Encounter for antineoplastic radiation therapy: Secondary | ICD-10-CM | POA: Diagnosis not present

## 2019-01-28 ENCOUNTER — Ambulatory Visit
Admission: RE | Admit: 2019-01-28 | Discharge: 2019-01-28 | Disposition: A | Discharge status: Home / Self Care | Payer: Medicare Other | Source: Ambulatory Visit | Attending: Radiation Oncology | Admitting: Radiation Oncology

## 2019-01-28 ENCOUNTER — Other Ambulatory Visit: Payer: Self-pay

## 2019-01-28 DIAGNOSIS — Z510 Encounter for antineoplastic radiation therapy: Secondary | ICD-10-CM | POA: Diagnosis not present

## 2019-01-28 DIAGNOSIS — C50211 Malignant neoplasm of upper-inner quadrant of right female breast: Secondary | ICD-10-CM | POA: Diagnosis not present

## 2019-01-28 DIAGNOSIS — Z170 Estrogen receptor positive status [ER+]: Secondary | ICD-10-CM | POA: Diagnosis not present

## 2019-01-28 NOTE — Progress Notes (Signed)
[name]   Telephone:(336) [phone] [phone]   Oncology Clinic Follow up Note   Patient Care Team: [name], [name] as PCP - General (General Practice) [name], RN as Oncology Nurse Navigator [name], RN as Oncology Nurse Navigator [name], MD as Consulting Physician (Hematology) [name], MD as Consulting Physician (General Surgery) [name], MD as Consulting Physician (Radiation Oncology)  Date of Service:  02/02/2019  CHIEF COMPLAINT: F/u of right breast cancer  SUMMARY OF ONCOLOGIC HISTORY: Oncology History Overview Note  Cancer Staging Malignant neoplasm of upper-inner quadrant of right breast in female, estrogen receptor positive ([place]) Staging form: Breast, AJCC 8th Edition - Clinical stage from 10/07/2018: cT2, cN0, cM0, GX, ER+, PR+, HER2: Equivocal - Signed by [name], MD on 10/12/2018 - Pathologic stage from 12/25/2018: Stage IA (pT2, pN0, cM0, G2, ER+, PR+, HER2-, Oncotype DX score: 21) - Signed by [name], MD on 02/01/2019    Malignant neoplasm of upper-inner quadrant of right breast in female, estrogen receptor positive ([place])  10/01/2018 Imaging   Bone Density Scan 10/01/18  IMPRESSION Lowest T-score of -0.9 at left femur neck (NORMAL) T-score DualFemur Neck Left  10/01/2018    76.1         -0.9    0.916 g/cm2   AP Spine  L1-L4      10/01/2018    76.1         1.2     1.326 g/cm2   DualFemur Total Mean 10/01/2018    76.1         0.1     1.024 g/cm2   10/05/2018 Mammogram   Diagnostic Mammogram 10/05/18 IMPRESSION: 1. Highly suspicious right breast mass 1 o'clock position 2 cm from the nipple on the right. It measures 2.1 x 1.7 x 1.5 cm. Corresponding with the screening mammographic findings. Recommendation is for ultrasound-guided biopsy. 2. Indeterminate left breast mass at the 10 o'clock position 9 cm from the nipple. It measures 1.3 x 0.5 x 0.3 cm. This may represent a benign etiology such as a  degenerating fibroadenoma. However, ultrasound-guided biopsy for definitive tissue diagnosis is recommended. 3. No suspicious lymphadenopathy bilaterally.   10/07/2018 Cancer Staging   Staging form: Breast, AJCC 8th Edition - Clinical stage from 10/07/2018: cT2, cN0, cM0, GX, ER+, PR+, HER2: Equivocal - Signed by [name], MD on 10/12/2018   10/07/2018 Initial Biopsy   Diagnosis 10/07/18 1. Breast, right, needle core biopsy, 1 o'clock, 2cmfn - INVASIVE MAMMARY CARCINOMA WITH CALCIFICATIONS. SEE NOTE 2. Breast, left, needle core biopsy, 10 o'clock - FIBROADENOMA WITH DYSTROPHIC CALCIFICATIONS - NEGATIVE FOR CARCINOMA   10/07/2018 Receptors her2   By immunohistochemistry, the tumor cells are EQUIVOCAL for Her2 (2+). HER2 by [place] will be PERFORMED and the RESULTS REPORTED SEPARATELY Estrogen Receptor: 100%, POSITIVE, STRONG STAINING INTENSITY Progesterone Receptor: 70%, POSITIVE, STRONG STAINING INTENSITY Proliferation Marker Ki67: 10%   10/11/2018 Initial Diagnosis   Malignant neoplasm of upper-inner quadrant of right breast in female, estrogen receptor positive ([name])   10/2018 -  Neo-Adjuvant Anti-estrogen oral therapy   She started anastrozole end of 10/2018 before surgery.   11/25/2018 Surgery   BILATERAL BREAST LUMPECTOMIES  WITH BILATERAL  RADIOACTIVE SEEDS AND RIGHT SENTINEL LYMPH NODE BIOPSY by Dr [name]  11/24/28   11/25/2018 Pathology Results   Diagnosis 11/25/18 1. Breast, lumpectomy, Left w/seed - FIBROADENOMA WITH CALCIFICATIONS. - BIOPSY SITE CHANGES. 2. Lymph node, sentinel, biopsy, Right Axillary #1 - ONE LYMPH NODE, NEGATIVE  FOR CARCINOMA (0/1). 3. Lymph node, sentinel, biopsy, Right Axillary #2 - ONE LYMPH NODE, NEGATIVE FOR CARCINOMA (0/1). 4. Lymph node, sentinel, biopsy, Right - ONE LYMPH NODE, NEGATIVE FOR CARCINOMA (0/1). 5. Breast, lumpectomy, Right w/seed - INVASIVE DUCTAL CARCINOMA, 2.6 CM, NOTTINGHAM GRADE 2 OF 3. - MARGINS OF RESECTION ARE NOT INVOLVED (CLOSEST  MARGIN: LESS THAN 1 MM, SUPERIOR). - BIOPSY SITE CHANGES. - SEE ONCOLOGY TABLE. 6. Medical device, removal, radioactive seed - RADIOACTIVE SEED. - GROSS ONLY.   11/25/2018 Oncotype testing   Oncotype 11/25/18 Recurrence Score 21 with distant recurrence risk at 9 years of with Tamoxifen alone 7%.  There is less than 1% benefit from chemo.    12/25/2018 Cancer Staging   Staging form: Breast, AJCC 8th Edition - Pathologic stage from 12/25/2018: Stage IA (pT2, pN0, cM0, G2, ER+, PR+, HER2-, Oncotype DX score: 21) - Signed by [name], MD on 02/01/2019   01/11/2019 - 02/08/2019 Radiation Therapy   Adjuvant Radiation with Dr [name] 01/11/19-02/08/19      CURRENT THERAPY:  Anastrozole 28m daily starting end of 10/2018 Adjuvant Radiation with Dr [name]10/20/20-11/17/20  INTERVAL HISTORY:  Elizabeth Mcfarland here for a follow up. She presents to the clinic alone.  She notes she is doing well. She notes she has low appetite and has to make herself eat sometimes. She notes since being on Anastrozole she has lower appetite. She does not eat 3 meals a day. She does snack but mainly at night. She has been drinking boost. She notes she has lost some weight.  She is otherwise tolerating anastrozole with no hot flashes and no joint pain.    REVIEW OF SYSTEMS:   Constitutional: Denies fevers, chills (+) Low appetite and weight loss Eyes: Denies blurriness of vision Ears, nose, mouth, throat, and face: Denies mucositis or sore throat Respiratory: Denies cough, dyspnea or wheezes Cardiovascular: Denies palpitation, chest discomfort or lower extremity swelling Gastrointestinal:  Denies nausea, heartburn or change in bowel habits Skin: Denies abnormal skin rashes Lymphatics: Denies new lymphadenopathy or easy bruising Neurological:Denies numbness, tingling or new weaknesses Behavioral/Psych: Mood is stable, no new changes  All other systems were reviewed with the patient and are negative.   MEDICAL HISTORY:  Past Medical History:  Diagnosis Date  . Allergy   . Anemia   . Blood transfusion without reported diagnosis   . Cancer ([place]   . Cataract   . Chronic kidney disease   . Diabetes mellitus without complication ([place]   . Esophageal ulcer with bleeding 05/05/2013   05/05/2013 EGD linear distal esophageal ulcer - cause of hematemesis prior to EGD   . GERD (gastroesophageal reflux disease)   . History of kidney stones   . Hyperlipidemia   . Hypertension   . Kidney cysts   . Sarcoidosis 1970    SURGICAL HISTORY: Past Surgical History:  Procedure Laterality Date  . ABDOMINAL HYSTERECTOMY    . BREAST LUMPECTOMY WITH RADIOACTIVE SEED AND SENTINEL LYMPH NODE BIOPSY Bilateral 11/25/2018   Procedure: BILATERAL BREAST LUMPECTOMIES  WITH BILATERAL  RADIOACTIVE SEEDS AND RIGHT SENTINEL LYMPH NODE BIOPSY;  Surgeon: TJovita Kussmaul MD;  [facility]  Service: General;  Laterality: Bilateral;  . CATARACT EXTRACTION W/ INTRAOCULAR LENS IMPLANT    . COLONOSCOPY    . COLONOSCOPY, ESOPHAGOGASTRODUODENOSCOPY (EGD) AND ESOPHAGEAL DILATION N/A 05/05/2013   Procedure: COLONOSCOPY, ESOPHAGOGASTRODUODENOSCOPY (EGD) AND ESOPHAGEAL DILATION (ED);  Surgeon: CGatha Mayer MD;  Location: [facility];  Service: Endoscopy;  Laterality: N/A;  .  LITHOTRIPSY    . POLYPECTOMY    . UPPER GASTROINTESTINAL ENDOSCOPY      I have reviewed the social history and family history with the patient and they are unchanged from previous note.  ALLERGIES:  is allergic to codeine and percocet [oxycodone-acetaminophen].  MEDICATIONS:  Current Outpatient Medications  Medication Sig Dispense Refill  . acetaminophen (TYLENOL) 500 MG tablet Take 1,000 mg by mouth every 6 (six) hours as needed for moderate pain or headache.     Marland Kitchen amLODipine (NORVASC) 10 MG tablet TAKE 1 TABLET BY MOUTH EVERY DAY (Patient taking differently: Take 10 mg by mouth daily. ) 90 tablet 1  . anastrozole (ARIMIDEX) 1 MG tablet Take 1  tablet (1 mg total) by mouth daily. 30 tablet 3  . Ascorbic Acid (VITAMIN C) 1000 MG tablet Take 1,000 mg by mouth daily.    Marland Kitchen aspirin EC 81 MG tablet Take 81 mg by mouth daily.    . cholecalciferol (VITAMIN D) 1000 UNITS tablet Take 1,000 Units by mouth daily.     Marland Kitchen glimepiride (AMARYL) 4 MG tablet TAKE 1 TABLET BY MOUTH TWICE DAILY 180 tablet 0  . lansoprazole (PREVACID) 15 MG capsule Take 15 mg by mouth daily as needed (acid reflux).     . Multiple Vitamin (MULTIVITAMIN WITH MINERALS) TABS tablet Take 1 tablet by mouth daily.     Glory Rosebush ULTRA test strip USE AS DIRECTED TO CHECK BLOOD SUGAR TWICE DAILY 100 strip 3  . Polyethyl Glycol-Propyl Glycol (SYSTANE OP) Place 1 drop into both eyes 2 (two) times daily.     . polyethylene glycol (MIRALAX / GLYCOLAX) packet Take 17 g by mouth daily.     . sitaGLIPtin-metformin (JANUMET) 50-500 MG tablet Take 1 tablet by mouth daily. 90 tablet 1   No current facility-administered medications for this visit.     PHYSICAL EXAMINATION: ECOG PERFORMANCE STATUS: 1 - Symptomatic but completely ambulatory  Vitals:   02/02/19 0854  BP: (!) 153/73  Pulse: 73  Resp: 17  Temp: 98.9 F (37.2 C)  SpO2: 100%   Filed Weights   02/02/19 0854  Weight: 131 lb 8 oz (59.6 kg)    GENERAL:alert, no distress and comfortable SKIN: skin color, texture, turgor are normal, no rashes or significant lesions EYES: normal, Conjunctiva are pink and non-injected, sclera clear  NECK: supple, thyroid normal size, non-tender, without nodularity LYMPH:  no palpable lymphadenopathy in the cervical, axillary  LUNGS: clear to auscultation and percussion with normal breathing effort HEART: regular rate & rhythm and no murmurs and no lower extremity edema ABDOMEN:abdomen soft, non-tender and normal bowel sounds Musculoskeletal:no cyanosis of digits and no clubbing  NEURO: alert & oriented x 3 with fluent speech, no focal motor/sensory deficits BREAST: S/p b/l lumpectomy:  Surgical incision healed well with scar tissue around right nipple and 3x1cm seroma in right axilla incision (+) Mild lymphedema of right breast (+) Lumpy breast tissue of left breast mainly in UOQ. No palpable mass, nodules or adenopathy bilaterally. Breast exam benign.   LABORATORY DATA:  I have reviewed the data as listed CBC Latest Ref Rng & Units 11/19/2018 10/13/2018 10/07/2018  WBC 4.0 - 10.5 K/uL 6.2 6.3 6.9  Hemoglobin 12.0 - 15.0 g/dL 12.1 11.8(L) 11.5  Hematocrit 36.0 - 46.0 % 37.0 36.7 35.5  Platelets 150 - 400 K/uL 291 282 252     CMP Latest Ref Rng & Units 11/19/2018 10/13/2018 10/07/2018  Glucose 70 - 99 mg/dL 194(H) 121(H) 160(H)  BUN 8 -  23 mg/dL _0 Creatinine 0.44 - 1.00 mg/dL 0.80 0.99 0.73  Sodium 135 - 145 mmol/L 138 143 140  Potassium 3.5 - 5.1 mmol/L 4.1 3.7 4.3  Chloride 98 - 111 mmol/L 104 100 101  CO2 22 - 32 mmol/L _1 Calcium 8.9 - 10.3 mg/dL 9.3 11.1(H) 9.6  Total Protein 6.5 - 8.1 g/dL - 7.5 -  Total Bilirubin 0.3 - 1.2 mg/dL - 0.6 -  Alkaline Phos 38 - 126 U/L - 74 -  AST 15 - 41 U/L - 25 -  ALT 0 - 44 U/L - 20 -      RADIOGRAPHIC STUDIES: I have personally reviewed the radiological images as listed and agreed with the findings in the report. No results found.   ASSESSMENT & PLAN:  Elizabeth Mcfarland is a 78 y.o. female with   1. Malignant neoplasm of upper-inner quadrant of right breast, StageIA, p(T2N0M0), ER/PR:+, HER2-, G2, RS 21 -She was diagnosed in 09/2018. She is s/p b/l breast lumpectomy on 11/25/18 with Dr. [name]. Her pathology showed complete surgical resection of tumor.  -Her Oncotype shows RS 21 with low risk of recurrence. There was less than 1% benefit of chemotherapy so it was not recommended.  -To reduce her risk of local recurrence she started adjuvant radiation with Dr [name] on 01/11/19. Plan to complete on 02/08/19. Tolerating well.  -She started anastrozole end of 10/2018 before surgery. She has tolerated mostly well  except with lower appetite and mild weight loss. I encouraged her to snack in between meals and increase ensure boost. I encouraged her to watch her BG and cholesterol as well .  -She is clinically doing well.Her physical exam was unremarkable except right breast seroma and lymphedema and lumpy left breast tissue. There is no clinical concern for recurrence. -Proceed with completion of RT. Continue surveillance and Anastrozole.  -Survivorship clinic in 3 months and f/u with me in 6 months.    2. DM, HTN -On amlodipine, glimepiride, janumet   3. Bone health  -08/2018 DEXA was normal with lowest T-score 0.9 at left femur neck.  -On calcium and Vitamin D.    PLAN:  -Continue RT  -Continue Anastrozole  -Survivorship clinic with NP Lacie in 3 months -Lab and f/u with me in 6 months.    No problem-specific Assessment & Plan notes found for this encounter.   No orders of the defined types were placed in this encounter.  All questions were answered. The patient knows to call the clinic with any problems, questions or concerns. No barriers to learning was detected. I spent 15 minutes counseling the patient face to face. The total time spent in the appointment was 20 minutes and more than 50% was on counseling and review of test results     [name], MD 02/02/2019   I, Joslyn Devon, am acting as scribe for [name], MD.   I have reviewed the above documentation for accuracy and completeness, and I agree with the above.

## 2019-01-31 ENCOUNTER — Ambulatory Visit
Admission: RE | Admit: 2019-01-31 | Discharge: 2019-01-31 | Disposition: A | Discharge status: Home / Self Care | Payer: Medicare Other | Source: Ambulatory Visit | Attending: Radiation Oncology | Admitting: Radiation Oncology

## 2019-01-31 ENCOUNTER — Other Ambulatory Visit: Payer: Self-pay

## 2019-01-31 DIAGNOSIS — Z510 Encounter for antineoplastic radiation therapy: Secondary | ICD-10-CM | POA: Diagnosis not present

## 2019-01-31 DIAGNOSIS — Z170 Estrogen receptor positive status [ER+]: Secondary | ICD-10-CM | POA: Diagnosis not present

## 2019-01-31 DIAGNOSIS — C50211 Malignant neoplasm of upper-inner quadrant of right female breast: Secondary | ICD-10-CM | POA: Diagnosis not present

## 2019-02-01 ENCOUNTER — Other Ambulatory Visit: Payer: Self-pay

## 2019-02-01 ENCOUNTER — Other Ambulatory Visit: Payer: Self-pay | Admitting: Nurse Practitioner

## 2019-02-01 ENCOUNTER — Ambulatory Visit
Admission: RE | Admit: 2019-02-01 | Discharge: 2019-02-01 | Disposition: A | Discharge status: Home / Self Care | Payer: Medicare Other | Source: Ambulatory Visit | Attending: Radiation Oncology | Admitting: Radiation Oncology

## 2019-02-01 DIAGNOSIS — Z510 Encounter for antineoplastic radiation therapy: Secondary | ICD-10-CM | POA: Diagnosis not present

## 2019-02-01 DIAGNOSIS — C50211 Malignant neoplasm of upper-inner quadrant of right female breast: Secondary | ICD-10-CM | POA: Diagnosis not present

## 2019-02-01 DIAGNOSIS — Z170 Estrogen receptor positive status [ER+]: Secondary | ICD-10-CM | POA: Diagnosis not present

## 2019-02-02 ENCOUNTER — Inpatient Hospital Stay: Payer: Medicare Other | Attending: Hematology | Admitting: Hematology

## 2019-02-02 ENCOUNTER — Encounter: Payer: Self-pay | Admitting: Hematology

## 2019-02-02 ENCOUNTER — Ambulatory Visit
Admission: RE | Admit: 2019-02-02 | Discharge: 2019-02-02 | Disposition: A | Discharge status: Home / Self Care | Payer: Medicare Other | Source: Ambulatory Visit | Attending: Radiation Oncology | Admitting: Radiation Oncology

## 2019-02-02 ENCOUNTER — Other Ambulatory Visit: Payer: Self-pay

## 2019-02-02 VITALS — BP 153/73 | HR 73 | Temp 98.9°F | Resp 17 | Ht 62.8 in | Wt 131.5 lb

## 2019-02-02 DIAGNOSIS — Z79899 Other long term (current) drug therapy: Secondary | ICD-10-CM | POA: Diagnosis not present

## 2019-02-02 DIAGNOSIS — Z7982 Long term (current) use of aspirin: Secondary | ICD-10-CM | POA: Insufficient documentation

## 2019-02-02 DIAGNOSIS — I10 Essential (primary) hypertension: Secondary | ICD-10-CM | POA: Diagnosis not present

## 2019-02-02 DIAGNOSIS — Z79811 Long term (current) use of aromatase inhibitors: Secondary | ICD-10-CM | POA: Diagnosis not present

## 2019-02-02 DIAGNOSIS — Z170 Estrogen receptor positive status [ER+]: Secondary | ICD-10-CM | POA: Diagnosis not present

## 2019-02-02 DIAGNOSIS — Z7984 Long term (current) use of oral hypoglycemic drugs: Secondary | ICD-10-CM | POA: Diagnosis not present

## 2019-02-02 DIAGNOSIS — Z90710 Acquired absence of both cervix and uterus: Secondary | ICD-10-CM | POA: Diagnosis not present

## 2019-02-02 DIAGNOSIS — N189 Chronic kidney disease, unspecified: Secondary | ICD-10-CM | POA: Diagnosis not present

## 2019-02-02 DIAGNOSIS — C50211 Malignant neoplasm of upper-inner quadrant of right female breast: Secondary | ICD-10-CM

## 2019-02-02 DIAGNOSIS — E119 Type 2 diabetes mellitus without complications: Secondary | ICD-10-CM | POA: Diagnosis not present

## 2019-02-02 DIAGNOSIS — E785 Hyperlipidemia, unspecified: Secondary | ICD-10-CM | POA: Diagnosis not present

## 2019-02-02 DIAGNOSIS — Z510 Encounter for antineoplastic radiation therapy: Secondary | ICD-10-CM | POA: Diagnosis not present

## 2019-02-02 DIAGNOSIS — Z923 Personal history of irradiation: Secondary | ICD-10-CM | POA: Insufficient documentation

## 2019-02-03 ENCOUNTER — Ambulatory Visit
Admission: RE | Admit: 2019-02-03 | Discharge: 2019-02-03 | Disposition: A | Discharge status: Home / Self Care | Payer: Medicare Other | Source: Ambulatory Visit | Attending: Radiation Oncology | Admitting: Radiation Oncology

## 2019-02-03 ENCOUNTER — Telehealth: Payer: Self-pay | Admitting: Hematology

## 2019-02-03 ENCOUNTER — Other Ambulatory Visit: Payer: Self-pay

## 2019-02-03 DIAGNOSIS — Z510 Encounter for antineoplastic radiation therapy: Secondary | ICD-10-CM | POA: Diagnosis not present

## 2019-02-03 DIAGNOSIS — Z170 Estrogen receptor positive status [ER+]: Secondary | ICD-10-CM | POA: Diagnosis not present

## 2019-02-03 DIAGNOSIS — C50211 Malignant neoplasm of upper-inner quadrant of right female breast: Secondary | ICD-10-CM | POA: Diagnosis not present

## 2019-02-03 NOTE — Telephone Encounter (Signed)
Scheduled appt per 11/11 los.  Sent a staff message to get a calendar mailed out.

## 2019-02-04 ENCOUNTER — Ambulatory Visit
Admission: RE | Admit: 2019-02-04 | Discharge: 2019-02-04 | Disposition: A | Discharge status: Home / Self Care | Payer: Medicare Other | Source: Ambulatory Visit | Attending: Radiation Oncology | Admitting: Radiation Oncology

## 2019-02-04 ENCOUNTER — Other Ambulatory Visit: Payer: Self-pay

## 2019-02-04 DIAGNOSIS — Z170 Estrogen receptor positive status [ER+]: Secondary | ICD-10-CM | POA: Diagnosis not present

## 2019-02-04 DIAGNOSIS — C50211 Malignant neoplasm of upper-inner quadrant of right female breast: Secondary | ICD-10-CM | POA: Diagnosis not present

## 2019-02-04 DIAGNOSIS — Z510 Encounter for antineoplastic radiation therapy: Secondary | ICD-10-CM | POA: Diagnosis not present

## 2019-02-07 ENCOUNTER — Other Ambulatory Visit: Payer: Self-pay

## 2019-02-07 ENCOUNTER — Ambulatory Visit
Admission: RE | Admit: 2019-02-07 | Discharge: 2019-02-07 | Disposition: A | Discharge status: Home / Self Care | Payer: Medicare Other | Source: Ambulatory Visit | Attending: Radiation Oncology | Admitting: Radiation Oncology

## 2019-02-07 DIAGNOSIS — Z510 Encounter for antineoplastic radiation therapy: Secondary | ICD-10-CM | POA: Diagnosis not present

## 2019-02-07 DIAGNOSIS — Z170 Estrogen receptor positive status [ER+]: Secondary | ICD-10-CM | POA: Diagnosis not present

## 2019-02-07 DIAGNOSIS — C50211 Malignant neoplasm of upper-inner quadrant of right female breast: Secondary | ICD-10-CM | POA: Diagnosis not present

## 2019-02-08 ENCOUNTER — Encounter: Payer: Self-pay | Admitting: Radiation Oncology

## 2019-02-08 ENCOUNTER — Other Ambulatory Visit: Payer: Self-pay

## 2019-02-08 ENCOUNTER — Encounter: Payer: Self-pay | Admitting: *Deleted

## 2019-02-08 ENCOUNTER — Ambulatory Visit
Admission: RE | Admit: 2019-02-08 | Discharge: 2019-02-08 | Disposition: A | Discharge status: Home / Self Care | Payer: Medicare Other | Source: Ambulatory Visit | Attending: Radiation Oncology | Admitting: Radiation Oncology

## 2019-02-08 DIAGNOSIS — Z510 Encounter for antineoplastic radiation therapy: Secondary | ICD-10-CM | POA: Diagnosis not present

## 2019-02-08 DIAGNOSIS — Z170 Estrogen receptor positive status [ER+]: Secondary | ICD-10-CM | POA: Diagnosis not present

## 2019-02-08 DIAGNOSIS — C50211 Malignant neoplasm of upper-inner quadrant of right female breast: Secondary | ICD-10-CM | POA: Diagnosis not present

## 2019-02-14 NOTE — Progress Notes (Incomplete)
Patient Name: Elizabeth Mcfarland MRN: [id number] DOB: March 24, 1941 Referring Physician: [name] (Profile Not Attached) Date of Service: 02/08/2019 [facility], [place]                                                        End Of Treatment Note  Diagnoses: C50.211-Malignant neoplasm of upper-inner quadrant of right female breast  Cancer Staging: Stage IIA (pT2, pN0) Right Breast UIQ, Invasive Ductal Carcinoma, ER+ / PR+ / Her2-, Grade 2  Intent: Curative  Radiation Treatment Dates: 01/11/2019 through 02/08/2019 Site Technique Total Dose (Gy) Dose per Fx (Gy) Completed Fx Beam Energies  Breast: Breast_Rt 3D 40.05/40.05 2.67 15/15 10X  Breast: Breast_Rt_Bst specialPort 12/12 2 6/6 12E   Narrative: The patient tolerated radiation therapy relatively well. Patient reported moderate to severe fatigue and hyperpigmentation of right breast throughout. Hyperpigmentation noted without skin breakdown.   Plan: The patient will follow-up with radiation oncology in three months.  ________________________________________________   Blair Promise, PhD, MD  This document serves as a record of services personally performed by [name], MD. It was created on his behalf by Clerance Lav, a trained medical scribe. The creation of this record is based on the scribe's personal observations and the provider's statements to them. This document has been checked and approved by the attending provider.

## 2019-02-25 ENCOUNTER — Other Ambulatory Visit: Payer: Self-pay | Admitting: Hematology

## 2019-02-28 ENCOUNTER — Other Ambulatory Visit: Payer: Self-pay | Admitting: Nurse Practitioner

## 2019-03-01 ENCOUNTER — Other Ambulatory Visit: Payer: Self-pay | Admitting: Nurse Practitioner

## 2019-03-10 ENCOUNTER — Other Ambulatory Visit: Payer: Self-pay

## 2019-03-10 ENCOUNTER — Encounter: Payer: Self-pay | Admitting: Radiation Oncology

## 2019-03-10 ENCOUNTER — Ambulatory Visit
Admission: RE | Admit: 2019-03-10 | Discharge: 2019-03-10 | Disposition: A | Discharge status: Home / Self Care | Payer: Medicare Other | Source: Ambulatory Visit | Attending: Radiation Oncology | Admitting: Radiation Oncology

## 2019-03-10 VITALS — BP 147/68 | HR 73 | Temp 98.3°F | Resp 20 | Wt 133.8 lb

## 2019-03-10 DIAGNOSIS — C50211 Malignant neoplasm of upper-inner quadrant of right female breast: Secondary | ICD-10-CM | POA: Diagnosis present

## 2019-03-10 DIAGNOSIS — Z170 Estrogen receptor positive status [ER+]: Secondary | ICD-10-CM | POA: Insufficient documentation

## 2019-03-10 DIAGNOSIS — Z7982 Long term (current) use of aspirin: Secondary | ICD-10-CM | POA: Diagnosis not present

## 2019-03-10 DIAGNOSIS — Z79899 Other long term (current) drug therapy: Secondary | ICD-10-CM | POA: Diagnosis not present

## 2019-03-10 DIAGNOSIS — Z923 Personal history of irradiation: Secondary | ICD-10-CM | POA: Insufficient documentation

## 2019-03-10 DIAGNOSIS — Z79811 Long term (current) use of aromatase inhibitors: Secondary | ICD-10-CM | POA: Diagnosis not present

## 2019-03-10 NOTE — Progress Notes (Signed)
Radiation Oncology         [phone] ________________________________  Name: Elizabeth Mcfarland MRN: [id number]  Date: 03/10/2019  DOB: 09/30/1940  Follow-Up Visit Note  CC: [name], FNP  [name], FNP    ICD-10-CM   1. Malignant neoplasm of upper-inner quadrant of right breast in female, estrogen receptor positive ([place])  C50.211    Z17.0     Diagnosis:   StageIIA(pT2, pN0)RightBreast UIQ,Invasive DuctalCarcinoma, ER+/ PR+/ Her2-, Grade2  Interval Since Last Radiation:  1 month   01/11/2019 through 02/08/2019 Site Technique Total Dose (Gy) Dose per Fx (Gy) Completed Fx Beam Energies  Breast: Breast_Rt 3D 40.05/40.05 2.67 15/15 10X  Breast: Breast_Rt_Bst specialPort 12/12 2 6/6 12E    Narrative:  The patient returns today for routine follow-up.  She seems to be tolerating Arimidex well.  She has some mild discomfort within the breast but no pain  On review of systems, she reports occasional heart palpitations and persistent fatigue. She denies breast pain and any other symptoms.  ALLERGIES:  is allergic to codeine and percocet [oxycodone-acetaminophen].  Meds: Current Outpatient Medications  Medication Sig Dispense Refill  . acetaminophen (TYLENOL) 500 MG tablet Take 1,000 mg by mouth every 6 (six) hours as needed for moderate pain or headache.     Marland Kitchen amLODipine (NORVASC) 10 MG tablet TAKE 1 TABLET BY MOUTH EVERY DAY 90 tablet 1  . anastrozole (ARIMIDEX) 1 MG tablet TAKE 1 TABLET(1 MG) BY MOUTH DAILY 30 tablet 3  . Ascorbic Acid (VITAMIN C) 1000 MG tablet Take 1,000 mg by mouth daily.    Marland Kitchen aspirin EC 81 MG tablet Take 81 mg by mouth daily.    . cholecalciferol (VITAMIN D) 1000 UNITS tablet Take 1,000 Units by mouth daily.     Marland Kitchen glimepiride (AMARYL) 4 MG tablet TAKE 1 TABLET BY MOUTH TWICE DAILY 180 tablet 0  . lansoprazole (PREVACID) 15 MG capsule Take 15 mg by mouth daily as needed (acid reflux).     . Multiple Vitamin (MULTIVITAMIN WITH MINERALS)  TABS tablet Take 1 tablet by mouth daily.     Glory Rosebush ULTRA test strip USE AS DIRECTED TO CHECK BLOOD SUGAR TWICE DAILY 100 strip 3  . Polyethyl Glycol-Propyl Glycol (SYSTANE OP) Place 1 drop into both eyes 2 (two) times daily.     . polyethylene glycol (MIRALAX / GLYCOLAX) packet Take 17 g by mouth daily.     . sitaGLIPtin-metformin (JANUMET) 50-500 MG tablet Take 1 tablet by mouth daily. 90 tablet 1   No current facility-administered medications for this encounter.    Physical Findings: The patient is in no acute distress. Patient is alert and oriented.  weight is 133 lb 12.8 oz (60.7 kg). Her temperature is 98.3 F (36.8 C). Her blood pressure is 147/68 (abnormal) and her pulse is 73. Her respiration is 20 and oxygen saturation is 100%. .   Lungs are clear to auscultation bilaterally. Heart has regular rate and rhythm. No palpable cervical, supraclavicular, or axillary adenopathy. Abdomen soft, non-tender, normal bowel sounds. Left Breast: no palpable mass, nipple discharge or bleeding. Right Breast: Skin is healed well.  Patient continues to have some edema in the breast as well as significant hyperpigmentation changes  Lab Findings: Lab Results  Component Value Date   WBC 6.2 11/19/2018   HGB 12.1 11/19/2018   HCT 37.0 11/19/2018   MCV 91.6 11/19/2018   PLT 291 11/19/2018    Radiographic Findings: No results found.  Impression:  The patient  is recovering from the effects of radiation.  No evidence of recurrence on clinical exam today  Plan: As needed follow-up in radiation oncology.  Patient will continue close follow-up with medical oncology and remain on Arimidex.  ____________________________________ [name], MD   This document serves as a record of services personally performed by [name], MD. It was created on his behalf by Wilburn Mylar, a trained medical scribe. The creation of this record is based on the scribe's personal observations and the  provider's statements to them. This document has been checked and approved by the attending provider.

## 2019-03-10 NOTE — Patient Instructions (Signed)
Coronavirus (COVID-19) Are you at risk?  Are you at risk for the Coronavirus (COVID-19)?  To be considered HIGH RISK for Coronavirus (COVID-19), you have to meet the following criteria:  . Traveled to [place], [place], [place], [place] or [place]; or in the [place] to [place], [place], [place], or [place]; and have fever, cough, and shortness of breath within the last 2 weeks of travel OR . Been in close contact with a person diagnosed with COVID-19 within the last 2 weeks and have fever, cough, and shortness of breath . IF YOU DO NOT MEET THESE CRITERIA, YOU ARE CONSIDERED LOW RISK FOR COVID-19.  What to do if you are HIGH RISK for COVID-19?  . If you are having a medical emergency, call 911. . Seek medical care right away. Before you go to a doctor's office, urgent care or emergency department, call ahead and tell them about your recent travel, contact with someone diagnosed with COVID-19, and your symptoms. You should receive instructions from your physician's office regarding next steps of care.  . When you arrive at healthcare provider, tell the healthcare staff immediately you have returned from visiting [place], [place], [place], [place] or [place]; or traveled in the [place] to [place], [place], [place], or [place]; in the last two weeks or you have been in close contact with a person diagnosed with COVID-19 in the last 2 weeks.   . Tell the health care staff about your symptoms: fever, cough and shortness of breath. . After you have been seen by a medical provider, you will be either: o Tested for (COVID-19) and discharged home on quarantine except to seek medical care if symptoms worsen, and asked to  - Stay home and avoid contact with others until you get your results (4-5 days)  - Avoid travel on public transportation if possible (such as bus, train, or airplane) or o Sent to the Emergency Department by EMS for evaluation, COVID-19 testing, and possible  admission depending on your condition and test results.  What to do if you are LOW RISK for COVID-19?  Reduce your risk of any infection by using the same precautions used for avoiding the common cold or flu:  . Wash your hands often with soap and warm water for at least 20 seconds.  If soap and water are not readily available, use an alcohol-based hand sanitizer with at least 60% alcohol.  . If coughing or sneezing, cover your mouth and nose by coughing or sneezing into the elbow areas of your shirt or coat, into a tissue or into your sleeve (not your hands). . Avoid shaking hands with others and consider head nods or verbal greetings only. . Avoid touching your eyes, nose, or mouth with unwashed hands.  . Avoid close contact with people who are sick. . Avoid places or events with large numbers of people in one location, like concerts or sporting events. . Carefully consider travel plans you have or are making. . If you are planning any travel outside or inside the [place], visit the CDC's Travelers' Health webpage for the latest health notices. . If you have some symptoms but not all symptoms, continue to monitor at home and seek medical attention if your symptoms worsen. . If you are having a medical emergency, call 911.   ADDITIONAL HEALTHCARE OPTIONS FOR PATIENTS  [facility] / e-Visit: [email or web address]         [facility]: [phone]  [facility]: [phone]                   [facility]: [phone]   

## 2019-03-10 NOTE — Progress Notes (Signed)
Pt presents today for f/u with Dr. [name]. Pt reports occasional "heart palpitations". Pt was strongly encouraged to seek medical advice from PCP. Pt noncommittal. Pt was offered ED. Pt refused. Pt reports fatigue persists. Pt denies c/o pain in breast. Breast continues to be hyperpigmented and slightly swollen.   BP (!) 147/68 (BP Location: Left Arm, Patient Position: Sitting, Cuff Size: Normal)   Pulse 73   Temp 98.3 F (36.8 C)   Resp 20   Wt 133 lb 12.8 oz (60.7 kg)   SpO2 100%   BMI 23.85 kg/m   Wt Readings from Last 3 Encounters:  03/10/19 133 lb 12.8 oz (60.7 kg)  02/02/19 131 lb 8 oz (59.6 kg)  01/05/19 133 lb 6.4 oz (60.5 kg)   Loma Sousa, RN BSN

## 2019-03-24 ENCOUNTER — Encounter: Payer: Self-pay | Admitting: *Deleted

## 2019-04-07 ENCOUNTER — Encounter: Payer: Self-pay | Admitting: Nurse Practitioner

## 2019-04-07 ENCOUNTER — Ambulatory Visit (INDEPENDENT_AMBULATORY_CARE_PROVIDER_SITE_OTHER): Payer: Medicare Other | Admitting: Nurse Practitioner

## 2019-04-07 ENCOUNTER — Other Ambulatory Visit: Payer: Self-pay

## 2019-04-07 VITALS — BP 132/72 | HR 78 | Temp 98.5°F | Ht 63.2 in | Wt 134.8 lb

## 2019-04-07 DIAGNOSIS — I10 Essential (primary) hypertension: Secondary | ICD-10-CM | POA: Diagnosis not present

## 2019-04-07 DIAGNOSIS — E782 Mixed hyperlipidemia: Secondary | ICD-10-CM | POA: Diagnosis not present

## 2019-04-07 DIAGNOSIS — E1165 Type 2 diabetes mellitus with hyperglycemia: Secondary | ICD-10-CM

## 2019-04-07 DIAGNOSIS — E559 Vitamin D deficiency, unspecified: Secondary | ICD-10-CM

## 2019-04-07 DIAGNOSIS — Z23 Encounter for immunization: Secondary | ICD-10-CM

## 2019-04-07 DIAGNOSIS — IMO0002 Reserved for concepts with insufficient information to code with codable children: Secondary | ICD-10-CM

## 2019-04-07 MED ORDER — PREVNAR 13 IM SUSP: 0.5000 mL | INTRAMUSCULAR | 0 refills | Status: AC

## 2019-04-07 NOTE — Progress Notes (Signed)
Subjective:     Patient ID: Elizabeth Mcfarland , female    DOB: [birth year] , 79 y.o.   MRN: [id number]   Chief Complaint  Patient presents with  . Diabetes    HPI  She has a decreased appetite she feels is related to the medication she is taking for her cancer.  She also feels down.    Wt Readings from Last 3 Encounters: 04/07/19 : 134 lb 12.8 oz (61.1 kg) 03/10/19 : 133 lb 12.8 oz (60.7 kg) 02/02/19 : 131 lb 8 oz (59.6 kg)   Diabetes She presents for her follow-up diabetic visit. She has type 2 diabetes mellitus. Her disease course has been stable. There are no hypoglycemic associated symptoms. Pertinent negatives for hypoglycemia include no dizziness or headaches. There are no diabetic associated symptoms. Pertinent negatives for diabetes include no chest pain. There are no hypoglycemic complications. There are no diabetic complications. There are no known risk factors for coronary artery disease. Her weight is stable. She is following a generally healthy diet. When asked about meal planning, she reported none. There is no change in her home blood glucose trend. (Blood sugar 90-130's.  She will take a 1/2 tab of metformin at night and if she takes a whole tab it will be 77 she will feel bad.  ) She does not see a podiatrist.Eye exam current: done 04/20/2018 - she needs to reschedule.  Hypertension This is a chronic problem. The current episode started more than 1 year ago. The problem is unchanged. The problem is controlled. Pertinent negatives include no chest pain, headaches, palpitations or peripheral edema. There are no associated agents to hypertension. Risk factors for coronary artery disease include sedentary lifestyle and diabetes mellitus. Past treatments include calcium channel blockers. The current treatment provides significant improvement. There are no compliance problems.  There is no history of angina or kidney disease. There is no history of chronic renal disease.      Past Medical History:  Diagnosis Date  . Allergy   . Anemia   . Blood transfusion without reported diagnosis   . Cancer ([place])   . Cataract   . Chronic kidney disease   . Diabetes mellitus without complication ([place])   . Esophageal ulcer with bleeding 05/05/2013   05/05/2013 EGD linear distal esophageal ulcer - cause of hematemesis prior to EGD   . GERD (gastroesophageal reflux disease)   . History of kidney stones   . Hyperlipidemia   . Hypertension   . Kidney cysts   . Sarcoidosis 1970     Family History  Problem Relation Age of Onset  . Diabetes Sister   . Kidney failure Sister   . Colon polyps Sister   . CAD Maternal Grandmother   . Early death Mother   . Stroke Father   . Stroke Brother   . Colon polyps Brother   . Cancer Brother 49       prostate cancer  . Heart disease Brother   . Emphysema Brother   . Cancer Brother 67       prostate cancer  . Asthma Brother   . Breast cancer Neg Hx   . Colon cancer Neg Hx   . Esophageal cancer Neg Hx   . Rectal cancer Neg Hx   . Stomach cancer Neg Hx      Current Outpatient Medications:  .  acetaminophen (TYLENOL) 500 MG tablet, Take 1,000 mg by mouth every 6 (six) hours as needed for moderate pain or headache. ,  Disp: , Rfl:  .  amLODipine (NORVASC) 10 MG tablet, TAKE 1 TABLET BY MOUTH EVERY DAY, Disp: 90 tablet, Rfl: 1 .  anastrozole (ARIMIDEX) 1 MG tablet, TAKE 1 TABLET(1 MG) BY MOUTH DAILY, Disp: 30 tablet, Rfl: 3 .  Ascorbic Acid (VITAMIN C) 1000 MG tablet, Take 1,000 mg by mouth daily., Disp: , Rfl:  .  aspirin EC 81 MG tablet, Take 81 mg by mouth daily., Disp: , Rfl:  .  cholecalciferol (VITAMIN D) 1000 UNITS tablet, Take 1,000 Units by mouth daily. , Disp: , Rfl:  .  glimepiride (AMARYL) 4 MG tablet, TAKE 1 TABLET BY MOUTH TWICE DAILY, Disp: 180 tablet, Rfl: 0 .  Multiple Vitamin (MULTIVITAMIN WITH MINERALS) TABS tablet, Take 1 tablet by mouth daily. , Disp: , Rfl:  .  ONETOUCH ULTRA test strip, USE AS DIRECTED TO  CHECK BLOOD SUGAR TWICE DAILY, Disp: 100 strip, Rfl: 3 .  Polyethyl Glycol-Propyl Glycol (SYSTANE OP), Place 1 drop into both eyes 2 (two) times daily. , Disp: , Rfl:  .  polyethylene glycol (MIRALAX / GLYCOLAX) packet, Take 17 g by mouth daily. , Disp: , Rfl:  .  sitaGLIPtin-metformin (JANUMET) 50-500 MG tablet, Take 1 tablet by mouth daily., Disp: 90 tablet, Rfl: 1 .  lansoprazole (PREVACID) 15 MG capsule, Take 15 mg by mouth daily as needed (acid reflux). , Disp: , Rfl:    Allergies  Allergen Reactions  . Codeine Nausea And Vomiting  . Percocet [Oxycodone-Acetaminophen] Nausea And Vomiting     Review of Systems  Constitutional: Negative.   Respiratory: Negative.   Cardiovascular: Negative for chest pain, palpitations and leg swelling.  Musculoskeletal: Negative.   Neurological: Negative for dizziness and headaches.  Psychiatric/Behavioral: Negative.      Today's Vitals   04/07/19 0946  BP: 132/72  Pulse: 78  Temp: 98.5 F (36.9 C)  TempSrc: Oral  Weight: 134 lb 12.8 oz (61.1 kg)  Height: 5' 3.2" (1.605 m)  PainSc: 0-No pain   Body mass index is 23.73 kg/m.   Objective:  Physical Exam Vitals reviewed.  Constitutional:      General: She is not in acute distress.    Appearance: Normal appearance.  Cardiovascular:     Rate and Rhythm: Normal rate and regular rhythm.     Pulses: Normal pulses.     Heart sounds: Normal heart sounds. No murmur.  Pulmonary:     Effort: Pulmonary effort is normal. No respiratory distress.     Breath sounds: Normal breath sounds.  Skin:    General: Skin is warm and dry.     Capillary Refill: Capillary refill takes less than 2 seconds.     Findings: No rash.  Neurological:     General: No focal deficit present.     Mental Status: She is alert and oriented to person, place, and time.  Psychiatric:        Mood and Affect: Mood normal.        Behavior: Behavior normal.        Thought Content: Thought content normal.        Judgment:  Judgment normal.         Assessment And Plan:      1. Type II diabetes mellitus with manifestations, uncontrolled (HCC) Chronic, controlled Continue with current medications Encouraged to limit intake of sugary foods and drinks Encouraged to increase physical activity to 150 minutes per week as tolerated - Hemoglobin A1c  2. Essential hypertension . B/P is well controlled.  Marland Kitchen  She has had a BMP in August 2020 . The importance of regular exercise and dietary modification was stressed to the patient.  . Stressed importance of losing ten percent of her body weight to help with B/P control.  . The weight loss would help with decreasing cardiac and cancer risk as well.  - CMP14+EGFR  3. Mixed hyperlipidemia  Chronic, controlled  Continue with current medications - Lipid Profile  4. Vitamin D deficiency  Will check vitamin D level and supplement as needed.     Also encouraged to spend 15 minutes in the sun daily.   5. Encounter for immunization  Rx sent to pharmacy and advised to get done at earliest convenience - pneumococcal 13-valent conjugate vaccine (PREVNAR 13) SUSP injection; Inject 0.5 mLs into the muscle tomorrow at 10 am for 1 dose.  Dispense: 0.5 mL; Refill: 0       [name], FNP    THE PATIENT IS ENCOURAGED TO PRACTICE SOCIAL DISTANCING DUE TO THE COVID-19 PANDEMIC.

## 2019-04-08 LAB — CMP14+EGFR
ALT: 21 IU/L (ref 0–32)
AST: 24 IU/L (ref 0–40)
Albumin/Globulin Ratio: 2 (ref 1.2–2.2)
Albumin: 4.5 g/dL (ref 3.7–4.7)
Alkaline Phosphatase: 71 IU/L (ref 39–117)
BUN/Creatinine Ratio: 9 — ABNORMAL LOW (ref 12–28)
BUN: 8 mg/dL (ref 8–27)
Bilirubin Total: 0.3 mg/dL (ref 0.0–1.2)
CO2: 28 mmol/L (ref 20–29)
Calcium: 9.7 mg/dL (ref 8.7–10.3)
Chloride: 101 mmol/L (ref 96–106)
Creatinine, Ser: 0.85 mg/dL (ref 0.57–1.00)
GFR calc Af Amer: 77 mL/min/{1.73_m2} (ref >59)
GFR calc non Af Amer: 67 mL/min/{1.73_m2} (ref >59)
Globulin, Total: 2.2 g/dL (ref 1.5–4.5)
Glucose: 144 mg/dL — ABNORMAL HIGH (ref 65–99)
Potassium: 4.2 mmol/L (ref 3.5–5.2)
Sodium: 141 mmol/L (ref 134–144)
Total Protein: 6.7 g/dL (ref 6.0–8.5)

## 2019-04-08 LAB — LIPID PANEL
Chol/HDL Ratio: 2.2 ratio (ref 0.0–4.4)
Cholesterol, Total: 235 mg/dL — ABNORMAL HIGH (ref 100–199)
HDL: 106 mg/dL (ref >39)
LDL Chol Calc (NIH): 124 mg/dL — ABNORMAL HIGH (ref 0–99)
Triglycerides: 28 mg/dL (ref 0–149)
VLDL Cholesterol Cal: 5 mg/dL (ref 5–40)

## 2019-04-08 LAB — HEMOGLOBIN A1C
Est. average glucose Bld gHb Est-mCnc: 123 mg/dL
Hgb A1c MFr Bld: 5.9 % — ABNORMAL HIGH (ref 4.8–5.6)

## 2019-04-17 MED ORDER — ATORVASTATIN CALCIUM 10 MG PO TABS: 10.0000 mg | ORAL_TABLET | Freq: Every day | ORAL | 11 refills | Status: DC

## 2019-05-05 ENCOUNTER — Other Ambulatory Visit: Payer: Self-pay

## 2019-05-05 ENCOUNTER — Encounter: Payer: Self-pay | Admitting: Nurse Practitioner

## 2019-05-05 ENCOUNTER — Inpatient Hospital Stay: Payer: Medicare Other | Attending: Hematology | Admitting: Nurse Practitioner

## 2019-05-05 VITALS — BP 147/84 | HR 87 | Temp 97.8°F | Resp 20 | Ht 63.2 in | Wt 137.3 lb

## 2019-05-05 DIAGNOSIS — Z923 Personal history of irradiation: Secondary | ICD-10-CM | POA: Diagnosis not present

## 2019-05-05 DIAGNOSIS — C50211 Malignant neoplasm of upper-inner quadrant of right female breast: Secondary | ICD-10-CM | POA: Insufficient documentation

## 2019-05-05 DIAGNOSIS — Z8249 Family history of ischemic heart disease and other diseases of the circulatory system: Secondary | ICD-10-CM | POA: Diagnosis not present

## 2019-05-05 DIAGNOSIS — E119 Type 2 diabetes mellitus without complications: Secondary | ICD-10-CM | POA: Insufficient documentation

## 2019-05-05 DIAGNOSIS — Z170 Estrogen receptor positive status [ER+]: Secondary | ICD-10-CM | POA: Diagnosis not present

## 2019-05-05 DIAGNOSIS — D869 Sarcoidosis, unspecified: Secondary | ICD-10-CM | POA: Insufficient documentation

## 2019-05-05 DIAGNOSIS — I10 Essential (primary) hypertension: Secondary | ICD-10-CM | POA: Diagnosis not present

## 2019-05-05 DIAGNOSIS — Z8042 Family history of malignant neoplasm of prostate: Secondary | ICD-10-CM | POA: Diagnosis not present

## 2019-05-05 DIAGNOSIS — Z7982 Long term (current) use of aspirin: Secondary | ICD-10-CM | POA: Insufficient documentation

## 2019-05-05 DIAGNOSIS — Z90710 Acquired absence of both cervix and uterus: Secondary | ICD-10-CM | POA: Insufficient documentation

## 2019-05-05 DIAGNOSIS — Z7984 Long term (current) use of oral hypoglycemic drugs: Secondary | ICD-10-CM | POA: Diagnosis not present

## 2019-05-05 DIAGNOSIS — N189 Chronic kidney disease, unspecified: Secondary | ICD-10-CM | POA: Insufficient documentation

## 2019-05-05 DIAGNOSIS — Z79899 Other long term (current) drug therapy: Secondary | ICD-10-CM | POA: Diagnosis not present

## 2019-05-05 DIAGNOSIS — E785 Hyperlipidemia, unspecified: Secondary | ICD-10-CM | POA: Insufficient documentation

## 2019-05-05 DIAGNOSIS — Z833 Family history of diabetes mellitus: Secondary | ICD-10-CM | POA: Diagnosis not present

## 2019-05-05 MED ORDER — ANASTROZOLE 1 MG PO TABS: ORAL_TABLET | ORAL | 3 refills | Status: DC

## 2019-05-05 NOTE — Progress Notes (Signed)
CLINIC:  Survivorship   REASON FOR VISIT:  Routine follow-up post-treatment for a recent history of breast cancer.  BRIEF ONCOLOGIC HISTORY:  Oncology History Overview Note  Cancer Staging Malignant neoplasm of upper-inner quadrant of right breast in female, estrogen receptor positive ([place]) Staging form: Breast, AJCC 8th Edition - Clinical stage from 10/07/2018: cT2, cN0, cM0, GX, ER+, PR+, HER2: Equivocal - Signed by [name], MD on 10/12/2018 - Pathologic stage from 12/25/2018: Stage IA (pT2, pN0, cM0, G2, ER+, PR+, HER2-, Oncotype DX score: 21) - Signed by [name], MD on 02/01/2019    Malignant neoplasm of upper-inner quadrant of right breast in female, estrogen receptor positive ([place])  10/01/2018 Imaging   Bone Density Scan 10/01/18  IMPRESSION Lowest T-score of -0.9 at left femur neck (NORMAL) T-score DualFemur Neck Left  10/01/2018    76.1         -0.9    0.916 g/cm2   AP Spine  L1-L4      10/01/2018    76.1         1.2     1.326 g/cm2   DualFemur Total Mean 10/01/2018    76.1         0.1     1.024 g/cm2   10/05/2018 Mammogram   Diagnostic Mammogram 10/05/18 IMPRESSION: 1. Highly suspicious right breast mass 1 o'clock position 2 cm from the nipple on the right. It measures 2.1 x 1.7 x 1.5 cm. Corresponding with the screening mammographic findings. Recommendation is for ultrasound-guided biopsy. 2. Indeterminate left breast mass at the 10 o'clock position 9 cm from the nipple. It measures 1.3 x 0.5 x 0.3 cm. This may represent a benign etiology such as a degenerating fibroadenoma. However, ultrasound-guided biopsy for definitive tissue diagnosis is recommended. 3. No suspicious lymphadenopathy bilaterally.   10/07/2018 Cancer Staging   Staging form: Breast, AJCC 8th Edition - Clinical stage from 10/07/2018: cT2, cN0, cM0, GX, ER+, PR+, HER2: Equivocal - Signed by [name], MD on 10/12/2018   10/07/2018 Initial Biopsy   Diagnosis 10/07/18 1. Breast, right, needle core  biopsy, 1 o'clock, 2cmfn - INVASIVE MAMMARY CARCINOMA WITH CALCIFICATIONS. SEE NOTE 2. Breast, left, needle core biopsy, 10 o'clock - FIBROADENOMA WITH DYSTROPHIC CALCIFICATIONS - NEGATIVE FOR CARCINOMA   10/07/2018 Receptors her2   By immunohistochemistry, the tumor cells are EQUIVOCAL for Her2 (2+). HER2 by [place] will be PERFORMED and the RESULTS REPORTED SEPARATELY Estrogen Receptor: 100%, POSITIVE, STRONG STAINING INTENSITY Progesterone Receptor: 70%, POSITIVE, STRONG STAINING INTENSITY Proliferation Marker Ki67: 10%   10/11/2018 Initial Diagnosis   Malignant neoplasm of upper-inner quadrant of right breast in female, estrogen receptor positive ([place])   10/2018 -  Neo-Adjuvant Anti-estrogen oral therapy   She started anastrozole end of 10/2018 before surgery.   11/25/2018 Surgery   BILATERAL BREAST LUMPECTOMIES  WITH BILATERAL  RADIOACTIVE SEEDS AND RIGHT SENTINEL LYMPH NODE BIOPSY by Dr [name]  11/24/28   11/25/2018 Pathology Results   Diagnosis 11/25/18 1. Breast, lumpectomy, Left w/seed - FIBROADENOMA WITH CALCIFICATIONS. - BIOPSY SITE CHANGES. 2. Lymph node, sentinel, biopsy, Right Axillary #1 - ONE LYMPH NODE, NEGATIVE FOR CARCINOMA (0/1). 3. Lymph node, sentinel, biopsy, Right Axillary #2 - ONE LYMPH NODE, NEGATIVE FOR CARCINOMA (0/1). 4. Lymph node, sentinel, biopsy, Right - ONE LYMPH NODE, NEGATIVE FOR CARCINOMA (0/1). 5. Breast, lumpectomy, Right w/seed - INVASIVE DUCTAL CARCINOMA, 2.6 CM, NOTTINGHAM GRADE 2 OF 3. - MARGINS OF RESECTION ARE NOT INVOLVED (CLOSEST MARGIN: LESS THAN 1 MM, SUPERIOR). - BIOPSY  SITE CHANGES. - SEE ONCOLOGY TABLE. 6. Medical device, removal, radioactive seed - RADIOACTIVE SEED. - GROSS ONLY.   11/25/2018 Oncotype testing   Oncotype 11/25/18 Recurrence Score 21 with distant recurrence risk at 9 years of with Tamoxifen alone 7%.  There is less than 1% benefit from chemo.    12/25/2018 Cancer Staging   Staging form: Breast, AJCC 8th Edition -  Pathologic stage from 12/25/2018: Stage IA (pT2, pN0, cM0, G2, ER+, PR+, HER2-, Oncotype DX score: 21) - Signed by [name], MD on 02/01/2019   01/11/2019 - 02/08/2019 Radiation Therapy   Adjuvant Radiation with Dr [name] 01/11/19-02/08/19   05/05/2019 Survivorship   SCP delivered by Cira Rue, NP      INTERVAL HISTORY:  Elizabeth Mcfarland presents to the [facility] today for our initial meeting to review her survivorship care plan detailing her treatment course for breast cancer, as well as monitoring long-term side effects of that treatment, education regarding health maintenance, screening, and overall wellness and health promotion.     Elizabeth Mcfarland presents to clinic alone. She is doing well. Energy fluctuates but remains active and mobile. Works in the yard for 6 hours at times. Back little stiff after this but resolves with tylenol PRN. Denies other bone or joint pain. She continues anastrozole. Denies hot flashes. Has some anxiety at times but "kicks it." Denies any bleeding or change in bowel habits. No recent fever, chills, cough, chest pain, dyspnea, leg swelling, or neuropathy. Her appetite was low before surgery but is improving, she is gaining weight. Right breast is darker, slightly tender, and swollen, but getting better since completing radiation. Denies other lump, nipple discharge, or inversion.    ONCOLOGY TREATMENT TEAM:  1. Surgeon:  Dr. [name] at [facility] 2. Medical Oncologist: Dr. Burr Medico  3. Radiation Oncologist: Dr. [name]    PAST MEDICAL/SURGICAL HISTORY:  Past Medical History:  Diagnosis Date  . Allergy   . Anemia   . Blood transfusion without reported diagnosis   . Cancer ([place])   . Cataract   . Chronic kidney disease   . Diabetes mellitus without complication ([place])   . Esophageal ulcer with bleeding 05/05/2013   05/05/2013 EGD linear distal esophageal ulcer - cause of hematemesis prior to EGD   . GERD (gastroesophageal reflux disease)   .  History of kidney stones   . Hyperlipidemia   . Hypertension   . Kidney cysts   . Sarcoidosis 1970   Past Surgical History:  Procedure Laterality Date  . ABDOMINAL HYSTERECTOMY    . BREAST LUMPECTOMY WITH RADIOACTIVE SEED AND SENTINEL LYMPH NODE BIOPSY Bilateral 11/25/2018   Procedure: BILATERAL BREAST LUMPECTOMIES  WITH BILATERAL  RADIOACTIVE SEEDS AND RIGHT SENTINEL LYMPH NODE BIOPSY;  Surgeon: [name], MD;  Location: [facility];  Service: General;  Laterality: Bilateral;  . CATARACT EXTRACTION W/ INTRAOCULAR LENS IMPLANT    . COLONOSCOPY    . COLONOSCOPY, ESOPHAGOGASTRODUODENOSCOPY (EGD) AND ESOPHAGEAL DILATION N/A 05/05/2013   Procedure: COLONOSCOPY, ESOPHAGOGASTRODUODENOSCOPY (EGD) AND ESOPHAGEAL DILATION (ED);  Surgeon: Gatha Mayer, MD;  Location: [facility];  Service: Endoscopy;  Laterality: N/A;  . LITHOTRIPSY    . POLYPECTOMY    . UPPER GASTROINTESTINAL ENDOSCOPY       ALLERGIES:  Allergies  Allergen Reactions  . Codeine Nausea And Vomiting  . Percocet [Oxycodone-Acetaminophen] Nausea And Vomiting     CURRENT MEDICATIONS:  Outpatient Encounter Medications as of 05/05/2019  Medication Sig  . acetaminophen (TYLENOL) 500 MG tablet Take 1,000  mg by mouth every 6 (six) hours as needed for moderate pain or headache.   . amLODipine (NORVASC) 10 MG tablet TAKE 1 TABLET BY MOUTH EVERY DAY  . anastrozole (ARIMIDEX) 1 MG tablet TAKE 1 TABLET(1 MG) BY MOUTH DAILY  . Ascorbic Acid (VITAMIN C) 1000 MG tablet Take 1,000 mg by mouth daily.  . aspirin EC 81 MG tablet Take 81 mg by mouth daily.  . atorvastatin (LIPITOR) 10 MG tablet Take 1 tablet (10 mg total) by mouth daily.  . cholecalciferol (VITAMIN D) 1000 UNITS tablet Take 1,000 Units by mouth daily.   . glimepiride (AMARYL) 4 MG tablet TAKE 1 TABLET BY MOUTH TWICE DAILY  . Multiple Vitamin (MULTIVITAMIN WITH MINERALS) TABS tablet Take 1 tablet by mouth daily.   . ONETOUCH ULTRA test strip USE AS DIRECTED TO CHECK BLOOD SUGAR  TWICE DAILY  . Polyethyl Glycol-Propyl Glycol (SYSTANE OP) Place 1 drop into both eyes 2 (two) times daily.   . polyethylene glycol (MIRALAX / GLYCOLAX) packet Take 17 g by mouth daily.   . sitaGLIPtin-metformin (JANUMET) 50-500 MG tablet Take 1 tablet by mouth daily.  . [DISCONTINUED] anastrozole (ARIMIDEX) 1 MG tablet TAKE 1 TABLET(1 MG) BY MOUTH DAILY  . lansoprazole (PREVACID) 15 MG capsule Take 15 mg by mouth daily as needed (acid reflux).    No facility-administered encounter medications on file as of 05/05/2019.     ONCOLOGIC FAMILY HISTORY:  Family History  Problem Relation Age of Onset  . Diabetes Sister   . Kidney failure Sister   . Colon polyps Sister   . CAD Maternal Grandmother   . Early death Mother   . Stroke Father   . Stroke Brother   . Colon polyps Brother   . Cancer Brother 65       prostate cancer  . Heart disease Brother   . Emphysema Brother   . Cancer Brother 65       prostate cancer  . Asthma Brother   . Breast cancer Neg Hx   . Colon cancer Neg Hx   . Esophageal cancer Neg Hx   . Rectal cancer Neg Hx   . Stomach cancer Neg Hx      GENETIC COUNSELING/TESTING: None  SOCIAL HISTORY:  Elizabeth Mcfarland lives alone in [place].  She denies any current or history of tobacco, alcohol, or illicit drug use.     PHYSICAL EXAMINATION:  Vital Signs:   Vitals:   05/05/19 0933  BP: (!) 147/84  Pulse: 87  Resp: 20  Temp: 97.8 F (36.6 C)  SpO2: 100%   Filed Weights   05/05/19 0933  Weight: 137 lb 4.8 oz (62.3 kg)   General: Well-nourished, well-appearing female in no acute distress.   HEENT: Sclerae anicteric.   Lymph: No cervical, supraclavicular, or infraclavicular lymphadenopathy noted on palpation.  Cardiovascular: Regular rate and rhythm. Respiratory: Clear; breathing non-labored.  GI/GU: Deferred.  Neuro: No focal deficits. Steady gait.  Psych: Mood and affect normal and appropriate for situation.  Extremities: No  edema. MSK: Full range of motion in bilateral upper extremities Skin: Warm and dry. Breast exam: s/p right lumpectomy and radiation. Incisions healed well. Mild to moderate hyperpigmentation and breast edema, no palpable mass in either breast or axilla that I could appreciate. Left breast s/p cyst removal historically, scar is healed, breast tissue diffusely lumpy   LABORATORY DATA:  None for this visit.  DIAGNOSTIC IMAGING:  None for this visit.      ASSESSMENT   AND PLAN:  Ms.. Mcfarland is a pleasant 78 y.o. female with Stage IA right breast invasive ductal carcinoma, ER+/PR+/HER2-, diagnosed in 09/2018, treated with lumpectomy, adjuvant radiation therapy, and anti-estrogen therapy with anastrozole beginning before surgery in 10/2018.  She presents to the Survivorship Clinic for our initial meeting and routine follow-up post-completion of treatment for breast cancer.    1. Stage IA right breast cancer:  Elizabeth Mcfarland is continuing to recover from definitive treatment for breast cancer. She will follow-up with her medical oncologist, Dr. Feng in 07/2019 with history and physical exam per surveillance protocol.  She will continue her anti-estrogen therapy with anastrozole. Thus far, she is tolerating it well, with minimal side effects. She was instructed to make Dr. Feng or myself aware if she begins to experience any worsening side effects of the medication and I could see her back in clinic to help manage those side effects, as needed. Her bresat exam shows mild to moderate hyperpigmentation and edema. I recommend PT to help but she prefers to hold for now. No clinical concern for recurrence. Today, a comprehensive survivorship care plan and treatment summary was reviewed with the patient today detailing her breast cancer diagnosis, treatment course, potential late/long-term effects of treatment, appropriate follow-up care with recommendations for the future, and patient education resources.  A copy of this  summary, along with a letter will be sent to the patient's primary care provider via In Basket message after today's visit.    2. Breast edema - secondary to surgery and adjuvant radiation: She would likely benefit from PT, but she prefers to wait to let it resolve. Will revisit at next f/u in 5/21.   3. Bone health:  Given Elizabeth Mcfarland's age/history of breast cancer and her current treatment regimen including anti-estrogen therapy with anastrozole, she is at risk for bone demineralization.  Her last DEXA scan was 10/01/2018 and was normal. Will repeat in 09/2020. In the meantime, she was encouraged to increase her consumption of foods rich in calcium, as well as increase her weight-bearing activities, and take a daily calcium + vitamin D supplement.  She was given education on specific activities to promote bone health.  4. Cancer screening:  Due to Elizabeth Mcfarland's history and her age, she should use shared decision making to determine appropriate cancer screenings. She is s/p hysterectomy. Her last colonoscopy was in 09/2018 and Dr. Gessner has not recommended repeating this. She knows to monitor for changes in her howel habits, abdominal pain, GI or GYN bleeding and report this to her care team. She was encouraged to have her skin checked, though the incidence of skin cancers is lower in her ethnicity. The information and recommendations are listed on the patient's comprehensive care plan/treatment summary and were reviewed in detail with the patient.    5. Health maintenance and wellness promotion: Elizabeth Mcfarland was encouraged to consume 5-7 servings of fruits and vegetables per day. We reviewed the "Nutrition Rainbow" handout, as well as the handout "Take Control of Your Health and Reduce Your Cancer Risk" from the [employer].  She was also encouraged to engage in moderate to vigorous exercise for 30 minutes per day most days of the week. We discussed the [employer] fitness program, which is  designed for cancer survivors to help them become more physically fit after cancer treatments.  She was instructed to limit her alcohol consumption and continue to abstain from tobacco use.     6. Support services/counseling: It is not uncommon   for this period of the patient's cancer care trajectory to be one of many emotions and stressors.  We discussed an opportunity for her to participate in the next session of [facility] ("Finding Your New Normal") support group series designed for patients after they have completed treatment.   Elizabeth Mcfarland was encouraged to take advantage of our many other support services programs, support groups, and/or counseling in coping with her new life as a cancer survivor after completing anti-cancer treatment.  She was offered support today through active listening and expressive supportive counseling.  She was given information regarding our available services and encouraged to contact me with any questions or for help enrolling in any of our support group/programs.    Dispo:   -Return to cancer center 07/2019  -Mammogram due in 09/2019, ordered today   -Follow up with surgery as indicated  -Continue AI, refilled  -She is welcome to return back to the Survivorship Clinic at any time; no additional follow-up needed at this time.  -Consider referral back to survivorship as a long-term survivor for continued surveillance  A total of (30) minutes of face-to-face time was spent with this patient with greater than 50% of that time in counseling and care-coordination.   Cira Rue, NP Survivorship Program [facility] [phone]   Note: PRIMARY CARE PROVIDER [name], [place] [phone]

## 2019-05-06 ENCOUNTER — Telehealth: Payer: Self-pay | Admitting: Nurse Practitioner

## 2019-05-06 NOTE — Telephone Encounter (Signed)
No los per 2/11. 

## 2019-05-13 ENCOUNTER — Other Ambulatory Visit: Payer: Self-pay | Admitting: Nurse Practitioner

## 2019-05-13 DIAGNOSIS — IMO0002 Reserved for concepts with insufficient information to code with codable children: Secondary | ICD-10-CM

## 2019-05-13 DIAGNOSIS — E1165 Type 2 diabetes mellitus with hyperglycemia: Secondary | ICD-10-CM

## 2019-05-16 ENCOUNTER — Other Ambulatory Visit: Payer: Self-pay

## 2019-05-16 DIAGNOSIS — E1165 Type 2 diabetes mellitus with hyperglycemia: Secondary | ICD-10-CM

## 2019-05-16 DIAGNOSIS — IMO0002 Reserved for concepts with insufficient information to code with codable children: Secondary | ICD-10-CM

## 2019-05-16 MED ORDER — JANUMET 50-500 MG PO TABS: 1.0000 | ORAL_TABLET | Freq: Every day | ORAL | 1 refills | Status: DC

## 2019-05-19 ENCOUNTER — Other Ambulatory Visit: Payer: Self-pay | Admitting: Nurse Practitioner

## 2019-05-19 DIAGNOSIS — E1165 Type 2 diabetes mellitus with hyperglycemia: Secondary | ICD-10-CM

## 2019-05-19 DIAGNOSIS — IMO0002 Reserved for concepts with insufficient information to code with codable children: Secondary | ICD-10-CM

## 2019-05-20 ENCOUNTER — Other Ambulatory Visit: Payer: Self-pay | Admitting: Nurse Practitioner

## 2019-05-20 DIAGNOSIS — E1165 Type 2 diabetes mellitus with hyperglycemia: Secondary | ICD-10-CM

## 2019-05-20 DIAGNOSIS — IMO0002 Reserved for concepts with insufficient information to code with codable children: Secondary | ICD-10-CM

## 2019-06-28 DIAGNOSIS — C50411 Malignant neoplasm of upper-outer quadrant of right female breast: Secondary | ICD-10-CM | POA: Diagnosis not present

## 2019-06-28 DIAGNOSIS — Z170 Estrogen receptor positive status [ER+]: Secondary | ICD-10-CM | POA: Diagnosis not present

## 2019-07-14 ENCOUNTER — Ambulatory Visit (INDEPENDENT_AMBULATORY_CARE_PROVIDER_SITE_OTHER): Payer: Medicare Other | Admitting: Nurse Practitioner

## 2019-07-14 ENCOUNTER — Other Ambulatory Visit: Payer: Self-pay

## 2019-07-14 ENCOUNTER — Ambulatory Visit (INDEPENDENT_AMBULATORY_CARE_PROVIDER_SITE_OTHER): Payer: Medicare Other

## 2019-07-14 ENCOUNTER — Encounter: Payer: Self-pay | Admitting: Nurse Practitioner

## 2019-07-14 VITALS — BP 138/70 | HR 79 | Temp 97.4°F | Ht 62.2 in | Wt 137.2 lb

## 2019-07-14 VITALS — BP 138/70 | HR 79 | Temp 97.4°F | Ht 62.2 in | Wt 137.1 lb

## 2019-07-14 DIAGNOSIS — E1165 Type 2 diabetes mellitus with hyperglycemia: Secondary | ICD-10-CM | POA: Diagnosis not present

## 2019-07-14 DIAGNOSIS — E559 Vitamin D deficiency, unspecified: Secondary | ICD-10-CM | POA: Diagnosis not present

## 2019-07-14 DIAGNOSIS — Z0000 Encounter for general adult medical examination without abnormal findings: Secondary | ICD-10-CM | POA: Diagnosis not present

## 2019-07-14 DIAGNOSIS — E118 Type 2 diabetes mellitus with unspecified complications: Secondary | ICD-10-CM | POA: Diagnosis not present

## 2019-07-14 DIAGNOSIS — F419 Anxiety disorder, unspecified: Secondary | ICD-10-CM

## 2019-07-14 DIAGNOSIS — I10 Essential (primary) hypertension: Secondary | ICD-10-CM

## 2019-07-14 DIAGNOSIS — E782 Mixed hyperlipidemia: Secondary | ICD-10-CM | POA: Diagnosis not present

## 2019-07-14 DIAGNOSIS — E11649 Type 2 diabetes mellitus with hypoglycemia without coma: Secondary | ICD-10-CM

## 2019-07-14 DIAGNOSIS — IMO0002 Reserved for concepts with insufficient information to code with codable children: Secondary | ICD-10-CM

## 2019-07-14 LAB — POCT URINALYSIS DIPSTICK
Bilirubin, UA: NEGATIVE
Blood, UA: NEGATIVE
Glucose, UA: NEGATIVE
Ketones, UA: NEGATIVE
Nitrite, UA: NEGATIVE
Protein, UA: NEGATIVE
Spec Grav, UA: >=1.03 — AB (ref 1.010–1.025)
Urobilinogen, UA: 0.2 E.U./dL
pH, UA: 6 (ref 5.0–8.0)

## 2019-07-14 LAB — POCT UA - MICROALBUMIN
Albumin/Creatinine Ratio, Urine, POC: <30
Creatinine, POC: 300 mg/dL
Microalbumin Ur, POC: 30 mg/L

## 2019-07-14 MED ORDER — SITAGLIPTIN PHOSPHATE 50 MG PO TABS: 50.0000 mg | ORAL_TABLET | Freq: Every day | ORAL | 1 refills | Status: DC

## 2019-07-14 MED ORDER — GLIMEPIRIDE 4 MG PO TABS: 4.0000 mg | ORAL_TABLET | ORAL | 1 refills | Status: DC

## 2019-07-14 NOTE — Patient Instructions (Signed)
Elizabeth Mcfarland , Thank you for taking time to come for your Medicare Wellness Visit. I appreciate your ongoing commitment to your health goals. Please review the following plan we discussed and let me know if I can assist you in the future.   Screening recommendations/referrals: Colonoscopy: 08/2018 Mammogram: 09/2018 Bone Density: 09/2018 Recommended yearly ophthalmology/optometry visit for glaucoma screening and checkup Recommended yearly dental visit for hygiene and checkup  Vaccinations: Influenza vaccine: 12/2018 Pneumococcal vaccine: following up with pharmacy Tdap vaccine: 03/2010 Shingles vaccine: discuseed    Advanced directives: Advance directive discussed with you today. I have provided a copy for you to complete at home and have notarized. Once this is complete please bring a copy in to our office so we can scan it into your chart.   Conditions/risks identified: none  Next appointment:    Preventive Care 22 Years and Older, Female Preventive care refers to lifestyle choices and visits with your health care provider that can promote health and wellness. What does preventive care include?  A yearly physical exam. This is also called an annual well check.  Dental exams once or twice a year.  Routine eye exams. Ask your health care provider how often you should have your eyes checked.  Personal lifestyle choices, including:  Daily care of your teeth and gums.  Regular physical activity.  Eating a healthy diet.  Avoiding tobacco and drug use.  Limiting alcohol use.  Practicing safe sex.  Taking low-dose aspirin every day.  Taking vitamin and mineral supplements as recommended by your health care provider. What happens during an annual well check? The services and screenings done by your health care provider during your annual well check will depend on your age, overall health, lifestyle risk factors, and family history of disease. Counseling  Your health care  provider may ask you questions about your:  Alcohol use.  Tobacco use.  Drug use.  Emotional well-being.  Home and relationship well-being.  Sexual activity.  Eating habits.  History of falls.  Memory and ability to understand (cognition).  Work and work Statistician.  Reproductive health. Screening  You may have the following tests or measurements:  Height, weight, and BMI.  Blood pressure.  Lipid and cholesterol levels. These may be checked every 5 years, or more frequently if you are over 60 years old.  Skin check.  Lung cancer screening. You may have this screening every year starting at age 78 if you have a 30-pack-year history of smoking and currently smoke or have quit within the past 15 years.  Fecal occult blood test (FOBT) of the stool. You may have this test every year starting at age 77.  Flexible sigmoidoscopy or colonoscopy. You may have a sigmoidoscopy every 5 years or a colonoscopy every 10 years starting at age 63.  Hepatitis C blood test.  Hepatitis B blood test.  Sexually transmitted disease (STD) testing.  Diabetes screening. This is done by checking your blood sugar (glucose) after you have not eaten for a while (fasting). You may have this done every 1-3 years.  Bone density scan. This is done to screen for osteoporosis. You may have this done starting at age 55.  Mammogram. This may be done every 1-2 years. Talk to your health care provider about how often you should have regular mammograms. Talk with your health care provider about your test results, treatment options, and if necessary, the need for more tests. Vaccines  Your health care provider may recommend certain vaccines, such as:  Influenza vaccine. This is recommended every year.  Tetanus, diphtheria, and acellular pertussis (Tdap, Td) vaccine. You may need a Td booster every 10 years.  Zoster vaccine. You may need this after age 44.  Pneumococcal 13-valent conjugate (PCV13)  vaccine. One dose is recommended after age 64.  Pneumococcal polysaccharide (PPSV23) vaccine. One dose is recommended after age 59. Talk to your health care provider about which screenings and vaccines you need and how often you need them. This information is not intended to replace advice given to you by your health care provider. Make sure you discuss any questions you have with your health care provider. Document Released: 04/06/2015 Document Revised: 11/28/2015 Document Reviewed: 01/09/2015 Elsevier Interactive Patient Education  2017 [employer] Prevention in the Home Falls can cause injuries. They can happen to people of all ages. There are many things you can do to make your home safe and to help prevent falls. What can I do on the outside of my home?  Regularly fix the edges of walkways and driveways and fix any cracks.  Remove anything that might make you trip as you walk through a door, such as a raised step or threshold.  Trim any bushes or trees on the path to your home.  Use bright outdoor lighting.  Clear any walking paths of anything that might make someone trip, such as rocks or tools.  Regularly check to see if handrails are loose or broken. Make sure that both sides of any steps have handrails.  Any raised decks and porches should have guardrails on the edges.  Have any leaves, snow, or ice cleared regularly.  Use sand or salt on walking paths during winter.  Clean up any spills in your garage right away. This includes oil or grease spills. What can I do in the bathroom?  Use night lights.  Install grab bars by the toilet and in the tub and shower. Do not use towel bars as grab bars.  Use non-skid mats or decals in the tub or shower.  If you need to sit down in the shower, use a plastic, non-slip stool.  Keep the floor dry. Clean up any water that spills on the floor as soon as it happens.  Remove soap buildup in the tub or shower  regularly.  Attach bath mats securely with double-sided non-slip rug tape.  Do not have throw rugs and other things on the floor that can make you trip. What can I do in the bedroom?  Use night lights.  Make sure that you have a light by your bed that is easy to reach.  Do not use any sheets or blankets that are too big for your bed. They should not hang down onto the floor.  Have a firm chair that has side arms. You can use this for support while you get dressed.  Do not have throw rugs and other things on the floor that can make you trip. What can I do in the kitchen?  Clean up any spills right away.  Avoid walking on wet floors.  Keep items that you use a lot in easy-to-reach places.  If you need to reach something above you, use a strong step stool that has a grab bar.  Keep electrical cords out of the way.  Do not use floor polish or wax that makes floors slippery. If you must use wax, use non-skid floor wax.  Do not have throw rugs and other things on the floor that  can make you trip. What can I do with my stairs?  Do not leave any items on the stairs.  Make sure that there are handrails on both sides of the stairs and use them. Fix handrails that are broken or loose. Make sure that handrails are as long as the stairways.  Check any carpeting to make sure that it is firmly attached to the stairs. Fix any carpet that is loose or worn.  Avoid having throw rugs at the top or bottom of the stairs. If you do have throw rugs, attach them to the floor with carpet tape.  Make sure that you have a light switch at the top of the stairs and the bottom of the stairs. If you do not have them, ask someone to add them for you. What else can I do to help prevent falls?  Wear shoes that:  Do not have high heels.  Have rubber bottoms.  Are comfortable and fit you well.  Are closed at the toe. Do not wear sandals.  If you use a stepladder:  Make sure that it is fully  opened. Do not climb a closed stepladder.  Make sure that both sides of the stepladder are locked into place.  Ask someone to hold it for you, if possible.  Clearly mark and make sure that you can see:  Any grab bars or handrails.  First and last steps.  Where the edge of each step is.  Use tools that help you move around (mobility aids) if they are needed. These include:  Canes.  Walkers.  Scooters.  Crutches.  Turn on the lights when you go into a dark area. Replace any light bulbs as soon as they burn out.  Set up your furniture so you have a clear path. Avoid moving your furniture around.  If any of your floors are uneven, fix them.  If there are any pets around you, be aware of where they are.  Review your medicines with your doctor. Some medicines can make you feel dizzy. This can increase your chance of falling. Ask your doctor what other things that you can do to help prevent falls. This information is not intended to replace advice given to you by your health care provider. Make sure you discuss any questions you have with your health care provider. Document Released: 01/04/2009 Document Revised: 08/16/2015 Document Reviewed: 04/14/2014 Elsevier Interactive Patient Education  2017 [employer].

## 2019-07-14 NOTE — Progress Notes (Signed)
This visit occurred during the SARS-CoV-2 public health emergency.  Safety protocols were in place, including screening questions prior to the visit, additional usage of staff PPE, and extensive cleaning of exam room while observing appropriate contact time as indicated for disinfecting solutions.  Subjective:   Elizabeth Mcfarland is a 79 y.o. female who presents for Medicare Annual (Subsequent) preventive examination.  Review of Systems:  n/a Cardiac Risk Factors include: advanced age (>67men, >37 women);diabetes mellitus;hypertension     Objective:     Vitals: BP 138/70 (BP Location: Left Arm, Patient Position: Sitting, Cuff Size: Normal)   Pulse 79   Temp (!) 97.4 F (36.3 C) (Oral)   Ht 5' 2.2" (1.58 m)   Wt 137 lb 3.2 oz (62.2 kg)   SpO2 98%   BMI 24.93 kg/m   Body mass index is 24.93 kg/m.  Advanced Directives 07/14/2019 03/10/2019 12/22/2018 11/19/2018 07/08/2018 12/23/2015 05/05/2013  Does Patient Have a Medical Advance Directive? No No No No Yes No Patient does not have advance directive  Type of Advance Directive - - - - Living will - -  Does patient want to make changes to medical advance directive? - - - - No - Guardian declined - -  Would patient like information on creating a medical advance directive? Yes (MAU/Ambulatory/Procedural Areas - Information given) - - Yes (MAU/Ambulatory/Procedural Areas - Information given) - - -    Tobacco Social History   Tobacco Use  Smoking Status Never Smoker  Smokeless Tobacco Never Used     Counseling given: Not Answered   Clinical Intake:  Pre-visit preparation completed: Yes  Pain : 0-10 Pain Score: 5  Pain Location: Shoulder Pain Orientation: Right, Left Pain Descriptors / Indicators: Aching Pain Onset: More than a month ago Pain Frequency: Intermittent Pain Relieving Factors: APAP helps  Pain Relieving Factors: APAP helps  Nutritional Status: BMI of 19-24  Normal Nutritional Risks: None Diabetes:  Yes  How often do you need to have someone help you when you read instructions, pamphlets, or other written materials from your doctor or pharmacy?: 1 - Never What is the last grade level you completed in school?: GED  Interpreter Needed?: No  Information entered by :: NAllen LPN  Past Medical History:  Diagnosis Date  . Allergy   . Anemia   . Blood transfusion without reported diagnosis   . Cancer ([place])   . Cataract   . Chronic kidney disease   . Diabetes mellitus without complication ([place])   . Esophageal ulcer with bleeding 05/05/2013   05/05/2013 EGD linear distal esophageal ulcer - cause of hematemesis prior to EGD   . GERD (gastroesophageal reflux disease)   . History of kidney stones   . Hyperlipidemia   . Hypertension   . Kidney cysts   . Sarcoidosis 1970   Past Surgical History:  Procedure Laterality Date  . ABDOMINAL HYSTERECTOMY    . BREAST LUMPECTOMY WITH RADIOACTIVE SEED AND SENTINEL LYMPH NODE BIOPSY Bilateral 11/25/2018   Procedure: BILATERAL BREAST LUMPECTOMIES  WITH BILATERAL  RADIOACTIVE SEEDS AND RIGHT SENTINEL LYMPH NODE BIOPSY;  Surgeon: [name], MD;  Location: [facility];  Service: General;  Laterality: Bilateral;  . CATARACT EXTRACTION W/ INTRAOCULAR LENS IMPLANT    . COLONOSCOPY    . COLONOSCOPY, ESOPHAGOGASTRODUODENOSCOPY (EGD) AND ESOPHAGEAL DILATION N/A 05/05/2013   Procedure: COLONOSCOPY, ESOPHAGOGASTRODUODENOSCOPY (EGD) AND ESOPHAGEAL DILATION (ED);  Surgeon: Gatha Mayer, MD;  Location: [facility];  Service: Endoscopy;  Laterality: N/A;  . LITHOTRIPSY    .  POLYPECTOMY    . UPPER GASTROINTESTINAL ENDOSCOPY     Family History  Problem Relation Age of Onset  . Diabetes Sister   . Kidney failure Sister   . Colon polyps Sister   . CAD Maternal Grandmother   . Early death Mother   . Stroke Father   . Stroke Brother   . Colon polyps Brother   . Cancer Brother 13       prostate cancer  . Heart disease Brother   . Emphysema Brother   . Cancer  Brother 7       prostate cancer  . Asthma Brother   . Breast cancer Neg Hx   . Colon cancer Neg Hx   . Esophageal cancer Neg Hx   . Rectal cancer Neg Hx   . Stomach cancer Neg Hx    Social History   Socioeconomic History  . Marital status: Single    Spouse name: Not on file  . Number of children: 1  . Years of education: Not on file  . Highest education level: Not on file  Occupational History  . Occupation: Physiological scientist: SELF-EMPLOYED  . Occupation: retired  Tobacco Use  . Smoking status: Never Smoker  . Smokeless tobacco: Never Used  Substance and Sexual Activity  . Alcohol use: No  . Drug use: No  . Sexual activity: Not Currently  Other Topics Concern  . Not on file  Social History Narrative  . Not on file   Social Determinants of Health   Financial Resource Strain: Low Risk   . Difficulty of Paying Living Expenses: Not hard at all  Food Insecurity: No Food Insecurity  . Worried About Charity fundraiser in the Last Year: Never true  . Ran Out of Food in the Last Year: Never true  Transportation Needs: No Transportation Needs  . Lack of Transportation (Medical): No  . Lack of Transportation (Non-Medical): No  Physical Activity: Sufficiently Active  . Days of Exercise per Week: 4 days  . Minutes of Exercise per Session: 150+ min  Stress: No Stress Concern Present  . Feeling of Stress : Not at all  Social Connections:   . Frequency of Communication with Friends and Family:   . Frequency of Social Gatherings with Friends and Family:   . Attends Religious Services:   . Active Member of Clubs or Organizations:   . Attends Archivist Meetings:   Marland Kitchen Marital Status:     Outpatient Encounter Medications as of 07/14/2019  Medication Sig  . acetaminophen (TYLENOL) 500 MG tablet Take 1,000 mg by mouth every 6 (six) hours as needed for moderate pain or headache.   Marland Kitchen amLODipine (NORVASC) 10 MG tablet TAKE 1 TABLET BY MOUTH EVERY DAY  . anastrozole  (ARIMIDEX) 1 MG tablet TAKE 1 TABLET(1 MG) BY MOUTH DAILY  . Ascorbic Acid (VITAMIN C) 1000 MG tablet Take 1,000 mg by mouth daily.  Marland Kitchen aspirin EC 81 MG tablet Take 81 mg by mouth daily.  . cholecalciferol (VITAMIN D) 1000 UNITS tablet Take 1,000 Units by mouth daily.   Marland Kitchen glimepiride (AMARYL) 4 MG tablet TAKE 1 TABLET BY MOUTH TWICE DAILY  . JANUMET 50-500 MG tablet TAKE 1 TABLET BY MOUTH EVERY DAY WITH MEALS  . Multiple Vitamin (MULTIVITAMIN WITH MINERALS) TABS tablet Take 1 tablet by mouth daily.   Glory Rosebush ULTRA test strip USE AS DIRECTED TO CHECK BLOOD SUGAR TWICE DAILY  . Polyethyl Glycol-Propyl Glycol (SYSTANE OP) Place  1 drop into both eyes 2 (two) times daily.   . polyethylene glycol (MIRALAX / GLYCOLAX) packet Take 17 g by mouth daily.   Marland Kitchen atorvastatin (LIPITOR) 10 MG tablet Take 1 tablet (10 mg total) by mouth daily. (Patient not taking: Reported on 07/14/2019)  . lansoprazole (PREVACID) 15 MG capsule Take 15 mg by mouth daily as needed (acid reflux).    No facility-administered encounter medications on file as of 07/14/2019.    Activities of Daily Living In your present state of health, do you have any difficulty performing the following activities: 07/14/2019 11/19/2018  Hearing? N N  Vision? N N  Difficulty concentrating or making decisions? N N  Walking or climbing stairs? N N  Dressing or bathing? N N  Doing errands, shopping? N N  Preparing Food and eating ? N -  Using the Toilet? N -  In the past six months, have you accidently leaked urine? N -  Do you have problems with loss of bowel control? N -  Managing your Medications? N -  Managing your Finances? N -  Housekeeping or managing your Housekeeping? N -  Some recent data might be hidden    Patient Care Team: [name], FNP as PCP - General (General Practice) [name], RN as Oncology Nurse Navigator [name], RN as Oncology Nurse Navigator [name], MD as Consulting Physician  (Hematology) [name], MD as Consulting Physician (General Surgery) [name], MD as Consulting Physician (Radiation Oncology) Alla Feeling, NP as Nurse Practitioner (Nurse Practitioner)    Assessment:   This is a routine wellness examination for [facility].  Exercise Activities and Dietary recommendations Current Exercise Habits: Home exercise routine, Type of exercise: Other - see comments(gardening), Time (Minutes): > 60, Frequency (Times/Week): 4, Weekly Exercise (Minutes/Week): 0  Goals    . Patient Stated     No goals    . Patient Stated     07/14/2019, wants to have regular bowel movements without taking medicine       Fall Risk Fall Risk  07/14/2019 04/07/2019 01/05/2019 10/07/2018 07/08/2018  Falls in the past year? 0 0 0 0 0  Risk for fall due to : Medication side effect - - - Medication side effect  Follow up Falls evaluation completed;Education provided;Falls prevention discussed - - - Education provided;Falls prevention discussed   Is the patient's home free of loose throw rugs in walkways, pet beds, electrical cords, etc?   yes      Grab bars in the bathroom? no      Handrails on the stairs?   n/a      Adequate lighting?   yes  Timed Get Up and Go performed: n/a  Depression Screen PHQ 2/9 Scores 07/14/2019 04/07/2019 01/05/2019 07/08/2018  PHQ - 2 Score 0 0 0 0  PHQ- 9 Score 0 - - 0     Cognitive Function     6CIT Screen 07/14/2019 07/08/2018  What Year? 0 points 0 points  What month? 0 points 0 points  What time? 0 points 0 points  Count back from 20 0 points 0 points  Months in reverse 0 points 0 points  Repeat phrase 2 points 0 points  Total Score 2 0    Immunization History  Administered Date(s) Administered  . Influenza, High Dose Seasonal PF 01/13/2018, 01/05/2019  . PFIZER SARS-COV-2 Vaccination 05/03/2019, 05/21/2019  . Td 03/24/2010    Qualifies for Shingles Vaccine? yes  Screening Tests Health Maintenance  Topic  Date Due  . PNA  vac Low Risk Adult (1 of 2 - PCV13) Never done  . OPHTHALMOLOGY EXAM  04/21/2019  . URINE MICROALBUMIN  07/08/2019  . HEMOGLOBIN A1C  10/05/2019  . INFLUENZA VACCINE  10/23/2019  . TETANUS/TDAP  03/24/2020  . FOOT EXAM  04/06/2020  . DEXA SCAN  Completed  . COVID-19 Vaccine  Completed    Cancer Screenings: Lung: Low Dose CT Chest recommended if Age 67-80 years, 30 pack-year currently smoking OR have quit w/in 15years. Patient does not qualify. Breast:  Up to date on Mammogram? Yes   Up to date of Bone Density/Dexa? Yes Colorectal: up to date  Additional Screenings: : Hepatitis C Screening: n/a     Plan:    Patient would like to manage bowels without having to take medication.   I have personally reviewed and noted the following in the patient's chart:   . Medical and social history . Use of alcohol, tobacco or illicit drugs  . Current medications and supplements . Functional ability and status . Nutritional status . Physical activity . Advanced directives . List of other physicians . Hospitalizations, surgeries, and ER visits in previous 12 months . Vitals . Screenings to include cognitive, depression, and falls . Referrals and appointments  In addition, I have reviewed and discussed with patient certain preventive protocols, quality metrics, and best practice recommendations. A written personalized care plan for preventive services as well as general preventive health recommendations were provided to patient.     Kellie Simmering, LPN  [phone]

## 2019-07-14 NOTE — Addendum Note (Signed)
Addended by: Kellie Simmering on: 07/14/2019 10:47 AM   Modules accepted: Orders

## 2019-07-14 NOTE — Progress Notes (Signed)
Subjective:     Patient ID: Elizabeth Mcfarland , female    DOB: 20-Jul-1940 , 79 y.o.   MRN: [id number]   Chief Complaint  Patient presents with  . Diabetes    HPI  She has lost 2 brothers in one month.   .Wt Readings from Last 3 Encounters: 07/14/19 : 137 lb 2 oz (62.2 kg) 07/14/19 : 137 lb 3.2 oz (62.2 kg) 05/05/19 : 137 lb 4.8 oz (62.3 kg)  Blood pressure range 135-174/77-102  Diabetes She presents for her follow-up diabetic visit. She has type 2 diabetes mellitus. Her disease course has been stable. Hypoglycemia symptoms include nervousness/anxiousness. Pertinent negatives for hypoglycemia include no dizziness or headaches. There are no diabetic associated symptoms. Pertinent negatives for diabetes include no chest pain and no fatigue. There are no hypoglycemic complications. There are no diabetic complications. There are no known risk factors for coronary artery disease. Her weight is stable. She is following a generally healthy diet. When asked about meal planning, she reported none. There is no change in her home blood glucose trend. (Blood sugar has been as low as 50 will take 1/2 tab amaryl at night. ) She does not see a podiatrist.Eye exam current: done 04/20/2018 - she needs to reschedule.  Hypertension This is a chronic problem. The current episode started more than 1 year ago. The problem is unchanged. The problem is controlled. Associated symptoms include anxiety. Pertinent negatives include no chest pain, headaches, palpitations or peripheral edema. There are no associated agents to hypertension. Risk factors for coronary artery disease include sedentary lifestyle and diabetes mellitus. Past treatments include calcium channel blockers. The current treatment provides significant improvement. There are no compliance problems.  There is no history of angina or kidney disease. There is no history of chronic renal disease.  Anxiety Presents for initial visit. Onset was 1 to 6  months ago. The problem has been gradually worsening. Symptoms include nervous/anxious behavior. Patient reports no chest pain, dizziness or palpitations. Primary symptoms comment: blood pressure has been going up when her brothers passed. The quality of sleep is good. Nighttime awakenings: none.   Past treatments include nothing.     Past Medical History:  Diagnosis Date  . Allergy   . Anemia   . Blood transfusion without reported diagnosis   . Cancer ([place])   . Cataract   . Chronic kidney disease   . Diabetes mellitus without complication ([place])   . Esophageal ulcer with bleeding 05/05/2013   05/05/2013 EGD linear distal esophageal ulcer - cause of hematemesis prior to EGD   . GERD (gastroesophageal reflux disease)   . History of kidney stones   . Hyperlipidemia   . Hypertension   . Kidney cysts   . Sarcoidosis 1970     Family History  Problem Relation Age of Onset  . Diabetes Sister   . Kidney failure Sister   . Colon polyps Sister   . CAD Maternal Grandmother   . Early death Mother   . Stroke Father   . Stroke Brother   . Colon polyps Brother   . Cancer Brother 20       prostate cancer  . Heart disease Brother   . Emphysema Brother   . Cancer Brother 30       prostate cancer  . Asthma Brother   . Breast cancer Neg Hx   . Colon cancer Neg Hx   . Esophageal cancer Neg Hx   . Rectal cancer Neg Hx   .  Stomach cancer Neg Hx      Current Outpatient Medications:  .  acetaminophen (TYLENOL) 500 MG tablet, Take 1,000 mg by mouth every 6 (six) hours as needed for moderate pain or headache. , Disp: , Rfl:  .  amLODipine (NORVASC) 10 MG tablet, TAKE 1 TABLET BY MOUTH EVERY DAY, Disp: 90 tablet, Rfl: 1 .  anastrozole (ARIMIDEX) 1 MG tablet, TAKE 1 TABLET(1 MG) BY MOUTH DAILY, Disp: 30 tablet, Rfl: 3 .  Ascorbic Acid (VITAMIN C) 1000 MG tablet, Take 1,000 mg by mouth daily., Disp: , Rfl:  .  aspirin EC 81 MG tablet, Take 81 mg by mouth daily., Disp: , Rfl:  .  atorvastatin  (LIPITOR) 10 MG tablet, Take 1 tablet (10 mg total) by mouth daily. (Patient not taking: Reported on 07/14/2019), Disp: 30 tablet, Rfl: 11 .  cholecalciferol (VITAMIN D) 1000 UNITS tablet, Take 1,000 Units by mouth daily. , Disp: , Rfl:  .  glimepiride (AMARYL) 4 MG tablet, TAKE 1 TABLET BY MOUTH TWICE DAILY, Disp: 180 tablet, Rfl: 0 .  JANUMET 50-500 MG tablet, TAKE 1 TABLET BY MOUTH EVERY DAY WITH MEALS, Disp: 90 tablet, Rfl: 1 .  lansoprazole (PREVACID) 15 MG capsule, Take 15 mg by mouth daily as needed (acid reflux). , Disp: , Rfl:  .  Multiple Vitamin (MULTIVITAMIN WITH MINERALS) TABS tablet, Take 1 tablet by mouth daily. , Disp: , Rfl:  .  ONETOUCH ULTRA test strip, USE AS DIRECTED TO CHECK BLOOD SUGAR TWICE DAILY, Disp: 100 strip, Rfl: 3 .  Polyethyl Glycol-Propyl Glycol (SYSTANE OP), Place 1 drop into both eyes 2 (two) times daily. , Disp: , Rfl:  .  polyethylene glycol (MIRALAX / GLYCOLAX) packet, Take 17 g by mouth daily. , Disp: , Rfl:    Allergies  Allergen Reactions  . Codeine Nausea And Vomiting  . Percocet [Oxycodone-Acetaminophen] Nausea And Vomiting     Review of Systems  Constitutional: Negative.  Negative for fatigue.  Respiratory: Negative.   Cardiovascular: Negative for chest pain, palpitations and leg swelling.  Musculoskeletal: Negative.   Neurological: Negative for dizziness and headaches.  Psychiatric/Behavioral: The patient is nervous/anxious.      Today's Vitals   07/14/19 0917  BP: 138/70  Pulse: 79  Temp: (!) 97.4 F (36.3 C)  TempSrc: Oral  Weight: 137 lb 2 oz (62.2 kg)  Height: 5' 2.2" (1.58 m)   Body mass index is 24.92 kg/m.   Objective:  Physical Exam Vitals reviewed.  Constitutional:      General: She is not in acute distress.    Appearance: Normal appearance.  Cardiovascular:     Rate and Rhythm: Normal rate and regular rhythm.     Pulses: Normal pulses.     Heart sounds: Normal heart sounds. No murmur.  Pulmonary:     Effort:  Pulmonary effort is normal. No respiratory distress.     Breath sounds: Normal breath sounds.  Skin:    General: Skin is warm and dry.     Capillary Refill: Capillary refill takes less than 2 seconds.     Findings: No rash.  Neurological:     General: No focal deficit present.     Mental Status: She is alert and oriented to person, place, and time.  Psychiatric:        Mood and Affect: Mood normal.        Behavior: Behavior normal.        Thought Content: Thought content normal.  Judgment: Judgment normal.         Assessment And Plan:      1. Type II diabetes mellitus with manifestations, uncontrolled (HCC) Chronic, controlled but with low blood sugars I will decrease her amaryl to once a day and after she completes her janumet she will take [place] since she is concerned about the risk for cancer with metformin Continue with current medications Encouraged to limit intake of sugary foods and drinks Encouraged to increase physical activity to 150 minutes per week as tolerated - Hemoglobin A1c  2. Essential hypertension . B/P is well controlled.  . She has had a BMP in August 2020 . The importance of regular exercise and dietary modification was stressed to the patient.  - CMP14+EGFR  3. Mixed hyperlipidemia  Chronic, controlled  Continue with current medications - Lipid Profile  4. Vitamin D deficiency  Will check vitamin D level and supplement as needed.     Also encouraged to spend 15 minutes in the sun daily.   5. Anxiety  She would like to try magnesium for her anxiety before considering taking a prescription medications        [name], FNP    THE PATIENT IS ENCOURAGED TO PRACTICE SOCIAL DISTANCING DUE TO THE COVID-19 PANDEMIC.

## 2019-07-15 LAB — CMP14+EGFR
ALT: 21 IU/L (ref 0–32)
AST: 32 IU/L (ref 0–40)
Albumin/Globulin Ratio: 2.1 (ref 1.2–2.2)
Albumin: 4.5 g/dL (ref 3.7–4.7)
Alkaline Phosphatase: 68 IU/L (ref 39–117)
BUN/Creatinine Ratio: 14 (ref 12–28)
BUN: 12 mg/dL (ref 8–27)
Bilirubin Total: 0.4 mg/dL (ref 0.0–1.2)
CO2: 26 mmol/L (ref 20–29)
Calcium: 9.8 mg/dL (ref 8.7–10.3)
Chloride: 102 mmol/L (ref 96–106)
Creatinine, Ser: 0.83 mg/dL (ref 0.57–1.00)
GFR calc Af Amer: 79 mL/min/{1.73_m2} (ref >59)
GFR calc non Af Amer: 69 mL/min/{1.73_m2} (ref >59)
Globulin, Total: 2.1 g/dL (ref 1.5–4.5)
Glucose: 75 mg/dL (ref 65–99)
Potassium: 4 mmol/L (ref 3.5–5.2)
Sodium: 141 mmol/L (ref 134–144)
Total Protein: 6.6 g/dL (ref 6.0–8.5)

## 2019-07-15 LAB — LIPID PANEL
Chol/HDL Ratio: 2.3 ratio (ref 0.0–4.4)
Cholesterol, Total: 216 mg/dL — ABNORMAL HIGH (ref 100–199)
HDL: 96 mg/dL (ref >39)
LDL Chol Calc (NIH): 114 mg/dL — ABNORMAL HIGH (ref 0–99)
Triglycerides: 31 mg/dL (ref 0–149)
VLDL Cholesterol Cal: 6 mg/dL (ref 5–40)

## 2019-07-15 LAB — VITAMIN D 25 HYDROXY (VIT D DEFICIENCY, FRACTURES): Vit D, 25-Hydroxy: 47.8 ng/mL (ref 30.0–100.0)

## 2019-07-15 LAB — HEMOGLOBIN A1C
Est. average glucose Bld gHb Est-mCnc: 123 mg/dL
Hgb A1c MFr Bld: 5.9 % — ABNORMAL HIGH (ref 4.8–5.6)

## 2019-07-29 ENCOUNTER — Other Ambulatory Visit: Payer: Self-pay

## 2019-07-29 DIAGNOSIS — Z170 Estrogen receptor positive status [ER+]: Secondary | ICD-10-CM

## 2019-07-29 NOTE — Progress Notes (Signed)
Elizabeth Mcfarland   Telephone:([phone]) [phone] Fax:([phone]) [phone]   Clinic Follow up Note   Patient Care Team: [name], [place] as PCP - General (General Practice) [name], RN as Oncology Nurse Navigator [name], RN as Oncology Nurse Navigator [name], MD as Consulting Physician (Hematology) [name], MD as Consulting Physician (General Surgery) [name], MD as Consulting Physician (Radiation Oncology) Alla Feeling, NP as Nurse Practitioner (Nurse Practitioner)  Date of Service:  08/01/2019  CHIEF COMPLAINT: F/u of right breast cancer  SUMMARY OF ONCOLOGIC HISTORY: Oncology History Overview Note  Cancer Staging Malignant neoplasm of upper-inner quadrant of right breast in female, estrogen receptor positive ([place]) Staging form: Breast, AJCC 8th Edition - Clinical stage from 10/07/2018: cT2, cN0, cM0, GX, ER+, PR+, HER2: Equivocal - Signed by [name], MD on 10/12/2018 - Pathologic stage from 12/25/2018: Stage IA (pT2, pN0, cM0, G2, ER+, PR+, HER2-, Oncotype DX score: 21) - Signed by [name], MD on 02/01/2019    Malignant neoplasm of upper-inner quadrant of right breast in female, estrogen receptor positive ([place])  10/01/2018 Imaging   Bone Density Scan 10/01/18  IMPRESSION Lowest T-score of -0.9 at left femur neck (NORMAL) T-score DualFemur Neck Left  10/01/2018    76.1         -0.9    0.916 g/cm2   AP Spine  L1-L4      10/01/2018    76.1         1.2     1.326 g/cm2   DualFemur Total Mean 10/01/2018    76.1         0.1     1.024 g/cm2   10/05/2018 Mammogram   Diagnostic Mammogram 10/05/18 IMPRESSION: 1. Highly suspicious right breast mass 1 o'clock position 2 cm from the nipple on the right. It measures 2.1 x 1.7 x 1.5 cm. Corresponding with the screening mammographic findings. Recommendation is for ultrasound-guided biopsy. 2. Indeterminate left breast mass at the 10 o'clock position 9 cm from the nipple. It measures 1.3 x 0.5 x 0.3  cm. This may represent a benign etiology such as a degenerating fibroadenoma. However, ultrasound-guided biopsy for definitive tissue diagnosis is recommended. 3. No suspicious lymphadenopathy bilaterally.   10/07/2018 Cancer Staging   Staging form: Breast, AJCC 8th Edition - Clinical stage from 10/07/2018: cT2, cN0, cM0, GX, ER+, PR+, HER2: Equivocal - Signed by [name], MD on 10/12/2018   10/07/2018 Initial Biopsy   Diagnosis 10/07/18 1. Breast, right, needle core biopsy, 1 o'clock, 2cmfn - INVASIVE MAMMARY CARCINOMA WITH CALCIFICATIONS. SEE NOTE 2. Breast, left, needle core biopsy, 10 o'clock - FIBROADENOMA WITH DYSTROPHIC CALCIFICATIONS - NEGATIVE FOR CARCINOMA   10/07/2018 Receptors her2   By immunohistochemistry, the tumor cells are EQUIVOCAL for Her2 (2+). HER2 by [place] will be PERFORMED and the RESULTS REPORTED SEPARATELY Estrogen Receptor: 100%, POSITIVE, STRONG STAINING INTENSITY Progesterone Receptor: 70%, POSITIVE, STRONG STAINING INTENSITY Proliferation Marker Ki67: 10%   10/11/2018 Initial Diagnosis   Malignant neoplasm of upper-inner quadrant of right breast in female, estrogen receptor positive ([place])   10/2018 -  Neo-Adjuvant Anti-estrogen oral therapy   She started anastrozole end of 10/2018 before surgery.   11/25/2018 Surgery   BILATERAL BREAST LUMPECTOMIES  WITH BILATERAL  RADIOACTIVE SEEDS AND RIGHT SENTINEL LYMPH NODE BIOPSY by Dr [name]  11/24/28   11/25/2018 Pathology Results   Diagnosis 11/25/18 1. Breast, lumpectomy, Left w/seed - FIBROADENOMA WITH CALCIFICATIONS. - BIOPSY SITE CHANGES. 2. Lymph node, sentinel, biopsy,  Right Axillary #1 - ONE LYMPH NODE, NEGATIVE FOR CARCINOMA (0/1). 3. Lymph node, sentinel, biopsy, Right Axillary #2 - ONE LYMPH NODE, NEGATIVE FOR CARCINOMA (0/1). 4. Lymph node, sentinel, biopsy, Right - ONE LYMPH NODE, NEGATIVE FOR CARCINOMA (0/1). 5. Breast, lumpectomy, Right w/seed - INVASIVE DUCTAL CARCINOMA, 2.6 CM, NOTTINGHAM GRADE 2 OF  3. - MARGINS OF RESECTION ARE NOT INVOLVED (CLOSEST MARGIN: LESS THAN 1 MM, SUPERIOR). - BIOPSY SITE CHANGES. - SEE ONCOLOGY TABLE. 6. Medical device, removal, radioactive seed - RADIOACTIVE SEED. - GROSS ONLY.   11/25/2018 Oncotype testing   Oncotype 11/25/18 Recurrence Score 21 with distant recurrence risk at 9 years of with Tamoxifen alone 7%.  There is less than 1% benefit from chemo.    12/25/2018 Cancer Staging   Staging form: Breast, AJCC 8th Edition - Pathologic stage from 12/25/2018: Stage IA (pT2, pN0, cM0, G2, ER+, PR+, HER2-, Oncotype DX score: 21) - Signed by [name], MD on 02/01/2019   01/11/2019 - 02/08/2019 Radiation Therapy   Adjuvant Radiation with Dr [name] 01/11/19-02/08/19   05/05/2019 Survivorship   SCP delivered by Cira Rue, NP       CURRENT THERAPY:  Anastrozole 36m daily starting end of 10/2018  INTERVAL HISTORY:  Elizabeth Leisneris here for a follow up of right breast cancer. She was last seen by me 6 months ago and seen by NP Lacie 3 months ago in interim. She presents to the clinic alone. She notes she is doing well. She feels lumpiness in her right breast. She notes she has lateral right breast aches. She denies dark urine. She notes she is tolerating Anastrozole well. She notes she has been anxious when busy since being on Anastrozole. Her mood is mostly stable and does not impact her sleep. She does not want to take medication for her mood. She notes she is active with her yard work.     REVIEW OF SYSTEMS:   Constitutional: Denies fevers, chills or abnormal weight loss Eyes: Denies blurriness of vision Ears, nose, mouth, throat, and face: Denies mucositis or sore throat Respiratory: Denies cough, dyspnea or wheezes Cardiovascular: Denies palpitation, chest discomfort or lower extremity swelling Gastrointestinal:  Denies nausea, heartburn or change in bowel habits Skin: Denies abnormal skin rashes Lymphatics: Denies new lymphadenopathy or  easy bruising Neurological:Denies numbness, tingling or new weaknesses Behavioral/Psych: Mood is stable, no new changes (+) intermittent anxiety.  Breast:(+) Right breast lumpiness with lateral breast aches All other systems were reviewed with the patient and are negative.  MEDICAL HISTORY:  Past Medical History:  Diagnosis Date  . Allergy   . Anemia   . Blood transfusion without reported diagnosis   . [place]   . Cataract   . Chronic kidney disease   . [place]   . Esophageal ulcer with bleeding 05/05/2013   05/05/2013 EGD linear distal esophageal ulcer - cause of hematemesis prior to EGD   . GERD (gastroesophageal reflux disease)   . History of kidney stones   . Hyperlipidemia   . Hypertension   . Kidney cysts   . Sarcoidosis 1970    SURGICAL HISTORY: Past Surgical History:  Procedure Laterality Date  . ABDOMINAL HYSTERECTOMY    . BREAST LUMPECTOMY WITH RADIOACTIVE SEED AND SENTINEL LYMPH NODE BIOPSY Bilateral 11/25/2018   Procedure: BILATERAL BREAST LUMPECTOMIES  WITH BILATERAL  RADIOACTIVE SEEDS AND RIGHT SENTINEL LYMPH NODE BIOPSY;  Surgeon: TJovita Kussmaul MD;  [facility]  Service: General;  Laterality: Bilateral;  .  CATARACT EXTRACTION W/ INTRAOCULAR LENS IMPLANT    . COLONOSCOPY    . COLONOSCOPY, ESOPHAGOGASTRODUODENOSCOPY (EGD) AND ESOPHAGEAL DILATION N/A 05/05/2013   Procedure: COLONOSCOPY, ESOPHAGOGASTRODUODENOSCOPY (EGD) AND ESOPHAGEAL DILATION (ED);  Surgeon: Gatha Mayer, MD;  Location: [facility];  Service: Endoscopy;  Laterality: N/A;  . LITHOTRIPSY    . POLYPECTOMY    . UPPER GASTROINTESTINAL ENDOSCOPY      I have reviewed the social history and family history with the patient and they are unchanged from previous note.  ALLERGIES:  is allergic to codeine and percocet [oxycodone-acetaminophen].  MEDICATIONS:  Current Outpatient Medications  Medication Sig Dispense Refill  . acetaminophen (TYLENOL) 500 MG  tablet Take 1,000 mg by mouth every 6 (six) hours as needed for moderate pain or headache.     Marland Kitchen amLODipine (NORVASC) 10 MG tablet TAKE 1 TABLET BY MOUTH EVERY DAY 90 tablet 1  . anastrozole (ARIMIDEX) 1 MG tablet TAKE 1 TABLET(1 MG) BY MOUTH DAILY 30 tablet 3  . Ascorbic Acid (VITAMIN C) 1000 MG tablet Take 1,000 mg by mouth daily.    Marland Kitchen aspirin EC 81 MG tablet Take 81 mg by mouth daily.    . cholecalciferol (VITAMIN D) 1000 UNITS tablet Take 1,000 Units by mouth daily.     Marland Kitchen glimepiride (AMARYL) 4 MG tablet Take 1 tablet (4 mg total) by mouth every morning. 90 tablet 1  . Multiple Vitamin (MULTIVITAMIN WITH MINERALS) TABS tablet Take 1 tablet by mouth daily.     Glory Rosebush ULTRA test strip USE AS DIRECTED TO CHECK BLOOD SUGAR TWICE DAILY 100 strip 3  . Polyethyl Glycol-Propyl Glycol (SYSTANE OP) Place 1 drop into both eyes 2 (two) times daily.     . polyethylene glycol (MIRALAX / GLYCOLAX) packet Take 17 g by mouth daily.     . sitaGLIPtin (JANUVIA) 50 MG tablet Take 1 tablet (50 mg total) by mouth daily. 90 tablet 1   No current facility-administered medications for this visit.    PHYSICAL EXAMINATION: ECOG PERFORMANCE STATUS: 0 - Asymptomatic  Vitals:   08/01/19 0909  BP: (!) 143/70  Pulse: 69  Resp: 20  Temp: 98.9 F (37.2 C)  SpO2: 100%   Filed Weights   08/01/19 0909  Weight: 137 lb (62.1 kg)    GENERAL:alert, no distress and comfortable SKIN: skin color, texture, turgor are normal, no rashes or significant lesions EYES: normal, Conjunctiva are pink and non-injected, sclera clear (+) Very mild yellowing of eyes.   NECK: supple, thyroid normal size, non-tender, without nodularity LYMPH:  no palpable lymphadenopathy in the cervical, axillary  LUNGS: clear to auscultation and percussion with normal breathing effort HEART: regular rate & rhythm and no murmurs and no lower extremity edema ABDOMEN:abdomen soft, non-tender and normal bowel sounds Musculoskeletal:no cyanosis of  digits and no clubbing  NEURO: alert & oriented x 3 with fluent speech, no focal motor/sensory deficits BREAST: S/p B/l lumpectomy: Surgical incisions healed well with mild scar tissue (+) Moderate Right breast lymphedema and skin hyperpigmentation. No palpable mass, nodules or adenopathy bilaterally. Breast exam benign.   LABORATORY DATA:  I have reviewed the data as listed CBC Latest Ref Rng & Units 08/01/2019 11/19/2018 10/13/2018  WBC 4.0 - 10.5 K/uL 4.8 6.2 6.3  Hemoglobin 12.0 - 15.0 g/dL 11.2(L) 12.1 11.8(L)  Hematocrit 36.0 - 46.0 % 35.1(L) 37.0 36.7  Platelets 150 - 400 K/uL 233 291 282     CMP Latest Ref Rng & Units 08/01/2019 07/14/2019 04/07/2019  Glucose 70 -  99 mg/dL 232(H) 75 144(H)  BUN 8 - 23 mg/dL '14 12 8  ' Creatinine 0.44 - 1.00 mg/dL 0.88 0.83 0.85  Sodium 135 - 145 mmol/L 139 141 141  Potassium 3.5 - 5.1 mmol/L 4.2 4.0 4.2  Chloride 98 - 111 mmol/L 104 102 101  CO2 22 - 32 mmol/L '27 26 28  ' Calcium 8.9 - 10.3 mg/dL 8.8(L) 9.8 9.7  Total Protein 6.5 - 8.1 g/dL 6.7 6.6 6.7  Total Bilirubin 0.3 - 1.2 mg/dL 0.5 0.4 0.3  Alkaline Phos 38 - 126 U/L 59 68 71  AST 15 - 41 U/L 28 32 24  ALT 0 - 44 U/L 33 21 21      RADIOGRAPHIC STUDIES: I have personally reviewed the radiological images as listed and agreed with the findings in the report. No results found.   ASSESSMENT & PLAN:  Elizabeth Mcfarland is a 79 y.o. female with    1.Malignant neoplasm of upper-inner quadrant of right breast, StageIA, p(T2N0M0), ER/PR:+, HER2-,G2, RS 21 -She was diagnosed in 09/2018. She is s/p b/l breast lumpectomy on 11/25/18 with Dr. [name]. Her pathology showed complete surgical resection of tumor, with low risk RS 21. She is s/p adjuvant RT in 10-01/2019.  -She started anastrozole end of 10/2018 before surgery.  -She is clinically doing well. She notes she has been feeling right breast lumpiness and lateral breast aches.  -Lab reviewed, her CBC and CMP are within normal limits except Hg  11.2, BG 232, Ca 8.8. Her physical exam showed moderate right breast lymphedema which makes her breast asymmetrical. There is no clinical concern for recurrence. -I discussed option of PT for her Lymphedema. She declined for now.  -Continue surveillance. Next Mammogram due in 08/2019  -Continue Anastrozole  -f/u in 8 months. If she has labs around that time she can cancel labs with [place].    2. DM, HTN -On amlodipine, glimepiride, janumet -Her BG is 232 today (08/01/19). I recommend she reduce sugar in her diet and continue to control DM.    3. Bone health  -08/2018 DEXA was normal with lowest T-score 0.9 at left femur neck.  -On calcium and Vitamin D.    4. Mild anemia  -Her hg was normal in 06/2019 with lab by PCP.  -Her Hg at 11.2 today (08/01/19). Prior labs shows she has had intermittent mild anemia in the past.  -Her 08/2018 colonoscopy with Dr Carlean Purl. Given her age she does not plan to repeat.  -I will check her iron level at next visit. I recommend she use OTC prenatal vitamin or multivitamin with iron. She is agreeable.    PLAN: -Continue Anastrozole  -Next mammogram in 08/2019 -Lab and F/u in 8 months    No problem-specific Assessment & Plan notes found for this encounter.   Orders Placed This Encounter  Procedures  . MM DIAG BREAST TOMO BILATERAL    Standing Status:   Future    Standing Expiration Date:   07/31/2020    Order Specific Question:   Reason for Exam (SYMPTOM  OR DIAGNOSIS REQUIRED)    Answer:   screening    Order Specific Question:   Preferred imaging location?    Answer:   [facility]  . Ferritin    Standing Status:   Future    Standing Expiration Date:   07/31/2020  . Iron and TIBC    Standing Status:   Future    Standing Expiration Date:   07/31/2020  . Vitamin B12  Standing Status:   Future    Standing Expiration Date:   07/31/2020   All questions were answered. The patient knows to call the clinic with any problems, questions or  concerns. No barriers to learning was detected. The total time spent in the appointment was 25 minutes.     [name], MD 08/01/2019   I, Joslyn Devon, am acting as scribe for [name], MD.   I have reviewed the above documentation for accuracy and completeness, and I agree with the above.

## 2019-08-01 ENCOUNTER — Other Ambulatory Visit: Payer: Self-pay

## 2019-08-01 ENCOUNTER — Encounter: Payer: Self-pay | Admitting: Nurse Practitioner

## 2019-08-01 ENCOUNTER — Inpatient Hospital Stay: Payer: Medicare Other | Admitting: Hematology

## 2019-08-01 ENCOUNTER — Inpatient Hospital Stay: Payer: Medicare Other | Attending: Hematology

## 2019-08-01 ENCOUNTER — Encounter: Payer: Self-pay | Admitting: Hematology

## 2019-08-01 VITALS — BP 143/70 | HR 69 | Temp 98.9°F | Resp 20 | Ht 62.2 in | Wt 137.0 lb

## 2019-08-01 DIAGNOSIS — D649 Anemia, unspecified: Secondary | ICD-10-CM

## 2019-08-01 DIAGNOSIS — I129 Hypertensive chronic kidney disease with stage 1 through stage 4 chronic kidney disease, or unspecified chronic kidney disease: Secondary | ICD-10-CM | POA: Insufficient documentation

## 2019-08-01 DIAGNOSIS — Z842 Family history of other diseases of the genitourinary system: Secondary | ICD-10-CM | POA: Diagnosis not present

## 2019-08-01 DIAGNOSIS — Z170 Estrogen receptor positive status [ER+]: Secondary | ICD-10-CM | POA: Insufficient documentation

## 2019-08-01 DIAGNOSIS — Z8249 Family history of ischemic heart disease and other diseases of the circulatory system: Secondary | ICD-10-CM | POA: Insufficient documentation

## 2019-08-01 DIAGNOSIS — Z79811 Long term (current) use of aromatase inhibitors: Secondary | ICD-10-CM | POA: Diagnosis not present

## 2019-08-01 DIAGNOSIS — Z7984 Long term (current) use of oral hypoglycemic drugs: Secondary | ICD-10-CM | POA: Diagnosis not present

## 2019-08-01 DIAGNOSIS — C50211 Malignant neoplasm of upper-inner quadrant of right female breast: Secondary | ICD-10-CM | POA: Diagnosis not present

## 2019-08-01 DIAGNOSIS — I890 Lymphedema, not elsewhere classified: Secondary | ICD-10-CM | POA: Insufficient documentation

## 2019-08-01 DIAGNOSIS — N189 Chronic kidney disease, unspecified: Secondary | ICD-10-CM | POA: Insufficient documentation

## 2019-08-01 DIAGNOSIS — Z90710 Acquired absence of both cervix and uterus: Secondary | ICD-10-CM | POA: Diagnosis not present

## 2019-08-01 DIAGNOSIS — E1122 Type 2 diabetes mellitus with diabetic chronic kidney disease: Secondary | ICD-10-CM | POA: Insufficient documentation

## 2019-08-01 DIAGNOSIS — Z833 Family history of diabetes mellitus: Secondary | ICD-10-CM | POA: Diagnosis not present

## 2019-08-01 DIAGNOSIS — D869 Sarcoidosis, unspecified: Secondary | ICD-10-CM | POA: Diagnosis not present

## 2019-08-01 DIAGNOSIS — Z79899 Other long term (current) drug therapy: Secondary | ICD-10-CM | POA: Diagnosis not present

## 2019-08-01 DIAGNOSIS — Z923 Personal history of irradiation: Secondary | ICD-10-CM | POA: Insufficient documentation

## 2019-08-01 LAB — CBC WITH DIFFERENTIAL (CANCER CENTER ONLY)
Abs Immature Granulocytes: 0.02 10*3/uL (ref 0.00–0.07)
Basophils Absolute: 0 10*3/uL (ref 0.0–0.1)
Basophils Relative: 1 %
Eosinophils Absolute: 0.1 10*3/uL (ref 0.0–0.5)
Eosinophils Relative: 1 %
HCT: 35.1 % — ABNORMAL LOW (ref 36.0–46.0)
Hemoglobin: 11.2 g/dL — ABNORMAL LOW (ref 12.0–15.0)
Immature Granulocytes: 0 %
Lymphocytes Relative: 16 %
Lymphs Abs: 0.8 10*3/uL (ref 0.7–4.0)
MCH: 29.8 pg (ref 26.0–34.0)
MCHC: 31.9 g/dL (ref 30.0–36.0)
MCV: 93.4 fL (ref 80.0–100.0)
Monocytes Absolute: 0.4 10*3/uL (ref 0.1–1.0)
Monocytes Relative: 7 %
Neutro Abs: 3.6 10*3/uL (ref 1.7–7.7)
Neutrophils Relative %: 75 %
Platelet Count: 233 10*3/uL (ref 150–400)
RBC: 3.76 MIL/uL — ABNORMAL LOW (ref 3.87–5.11)
RDW: 12.6 % (ref 11.5–15.5)
WBC Count: 4.8 10*3/uL (ref 4.0–10.5)
nRBC: 0 % (ref 0.0–0.2)

## 2019-08-01 LAB — CMP (CANCER CENTER ONLY)
ALT: 33 U/L (ref 0–44)
AST: 28 U/L (ref 15–41)
Albumin: 3.7 g/dL (ref 3.5–5.0)
Alkaline Phosphatase: 59 U/L (ref 38–126)
Anion gap: 8 (ref 5–15)
BUN: 14 mg/dL (ref 8–23)
CO2: 27 mmol/L (ref 22–32)
Calcium: 8.8 mg/dL — ABNORMAL LOW (ref 8.9–10.3)
Chloride: 104 mmol/L (ref 98–111)
Creatinine: 0.88 mg/dL (ref 0.44–1.00)
GFR, Est AFR Am: >60 mL/min (ref >60)
GFR, Estimated: >60 mL/min (ref >60)
Glucose, Bld: 232 mg/dL — ABNORMAL HIGH (ref 70–99)
Potassium: 4.2 mmol/L (ref 3.5–5.1)
Sodium: 139 mmol/L (ref 135–145)
Total Bilirubin: 0.5 mg/dL (ref 0.3–1.2)
Total Protein: 6.7 g/dL (ref 6.5–8.1)

## 2019-08-02 ENCOUNTER — Telehealth: Payer: Self-pay | Admitting: Hematology

## 2019-08-02 NOTE — Telephone Encounter (Signed)
Scheduled appt per 5/10 los.  Spoke with pt and they are aware of the appot date and time.

## 2019-08-03 ENCOUNTER — Other Ambulatory Visit: Payer: Self-pay | Admitting: Nurse Practitioner

## 2019-09-07 ENCOUNTER — Telehealth: Payer: Self-pay | Admitting: Nurse Practitioner

## 2019-09-07 DIAGNOSIS — E11319 Type 2 diabetes mellitus with unspecified diabetic retinopathy without macular edema: Secondary | ICD-10-CM | POA: Diagnosis not present

## 2019-09-07 DIAGNOSIS — H5203 Hypermetropia, bilateral: Secondary | ICD-10-CM | POA: Diagnosis not present

## 2019-09-07 LAB — HM DIABETES EYE EXAM

## 2019-09-07 NOTE — Chronic Care Management (AMB) (Signed)
  Chronic Care Management   Outreach Note  09/07/2019 Name: Tilia Faso MRN: [id number] DOB: Dec 25, 1940  Carlotta Telfair is a 79 y.o. year old female who is a primary care patient of [name], [place]. I reached out to [name] by phone today in response to a referral sent by Ms. Kathlene Cote [place]'s health plan.     An unsuccessful telephone outreach was attempted today. The patient was referred to the case management team for assistance with care management and care coordination.   Follow Up Plan: A HIPPA compliant phone message was left for the patient providing contact information and requesting a return call. The care management team will reach out to the patient again over the next 7 days. If patient returns call to provider office, please advise to call [place] at [phone].  [place], [place] [address] Direct Dial: [phone] [email or web address]: [email or web address]

## 2019-09-09 ENCOUNTER — Other Ambulatory Visit: Payer: Self-pay | Admitting: Nurse Practitioner

## 2019-09-13 ENCOUNTER — Encounter: Payer: Self-pay | Admitting: Nurse Practitioner

## 2019-09-13 NOTE — Chronic Care Management (AMB) (Signed)
  Chronic Care Management   Outreach Note  09/13/2019 Name: Elizabeth Mcfarland MRN: [id number] DOB: Jun 10, 1940  Elizabeth Mcfarland is a 79 y.o. year old female who is a primary care patient of [name], [place]. I reached out to [name] by phone today in response to a referral sent by Ms. Kathlene Cote [place]'s health plan.     A second unsuccessful telephone outreach was attempted today. The patient was referred to the case management team for assistance with care management and care coordination.   Follow Up Plan: A HIPPA compliant phone message was left for the patient providing contact information and requesting a return call. The care management team will reach out to the patient again over the next 7 days. If patient returns call to provider office, please advise to call [place] at [phone].  [place], [place] [address] Direct Dial: [phone] [email or web address]: [email or web address]

## 2019-09-21 NOTE — Chronic Care Management (AMB) (Signed)
°  Chronic Care Management   Outreach Note  09/21/2019 Name: Elizabeth Mcfarland MRN: [id number] DOB: [birth year]  Elizabeth Mcfarland is a 79 y.o. year old female who is a primary care patient of [name], [facility]. I reached out to [name] by phone today in response to a referral sent by [name]'s health plan.     Third unsuccessful telephone outreach was attempted today. The patient was referred to the case management team for assistance with care management and care coordination. The patient's primary care provider has been notified of our unsuccessful attempts to make or maintain contact with the patient. The care management team is pleased to engage with this patient at any time in the future should he/she be interested in assistance from the care management team.   Follow Up Plan: A HIPPA compliant phone message was left for the patient providing contact information and requesting a return call. The care management team is available to follow up with the patient after provider conversation with the patient regarding recommendation for care management engagement and subsequent re-referral to the care management team.  If patient returns call to provider office, please advise to call [place] at [phone].  [place], [place] [address] Direct Dial: [phone] [email or web address] Website: [email or web address]

## 2019-09-29 ENCOUNTER — Telehealth: Payer: Self-pay

## 2019-09-29 NOTE — Telephone Encounter (Signed)
I called pt to see if she would be ok taking atorvastatin daily to help reduce her risk of cardiac diseases. Left pt v/m to call the office [facility]

## 2019-10-03 ENCOUNTER — Other Ambulatory Visit: Payer: Self-pay

## 2019-10-03 MED ORDER — ATORVASTATIN CALCIUM 10 MG PO TABS: 10.0000 mg | ORAL_TABLET | Freq: Every day | ORAL | 0 refills | Status: DC

## 2019-10-10 ENCOUNTER — Ambulatory Visit
Admission: RE | Admit: 2019-10-10 | Discharge: 2019-10-10 | Disposition: A | Discharge status: Home / Self Care | Payer: Medicare Other | Source: Ambulatory Visit | Attending: Hematology | Admitting: Hematology

## 2019-10-10 ENCOUNTER — Other Ambulatory Visit: Payer: Self-pay

## 2019-10-10 DIAGNOSIS — R922 Inconclusive mammogram: Secondary | ICD-10-CM | POA: Diagnosis not present

## 2019-10-10 DIAGNOSIS — Z853 Personal history of malignant neoplasm of breast: Secondary | ICD-10-CM | POA: Diagnosis not present

## 2019-10-10 DIAGNOSIS — Z170 Estrogen receptor positive status [ER+]: Secondary | ICD-10-CM

## 2019-10-10 HISTORY — DX: Personal history of irradiation: Z92.3

## 2019-10-13 ENCOUNTER — Ambulatory Visit (INDEPENDENT_AMBULATORY_CARE_PROVIDER_SITE_OTHER): Payer: Medicare Other | Admitting: Nurse Practitioner

## 2019-10-13 ENCOUNTER — Encounter: Payer: Self-pay | Admitting: Nurse Practitioner

## 2019-10-13 ENCOUNTER — Other Ambulatory Visit: Payer: Self-pay

## 2019-10-13 VITALS — BP 140/72 | HR 65 | Temp 98.2°F | Ht 61.8 in | Wt 133.0 lb

## 2019-10-13 DIAGNOSIS — H401131 Primary open-angle glaucoma, bilateral, mild stage: Secondary | ICD-10-CM | POA: Diagnosis not present

## 2019-10-13 DIAGNOSIS — R202 Paresthesia of skin: Secondary | ICD-10-CM | POA: Diagnosis not present

## 2019-10-13 DIAGNOSIS — E1169 Type 2 diabetes mellitus with other specified complication: Secondary | ICD-10-CM

## 2019-10-13 DIAGNOSIS — R200 Anesthesia of skin: Secondary | ICD-10-CM

## 2019-10-13 DIAGNOSIS — I10 Essential (primary) hypertension: Secondary | ICD-10-CM | POA: Diagnosis not present

## 2019-10-13 DIAGNOSIS — Z1159 Encounter for screening for other viral diseases: Secondary | ICD-10-CM

## 2019-10-13 NOTE — Patient Instructions (Signed)
Recommended get a wrist splint to wear at night    Prediabetes Eating Plan Prediabetes is a condition that causes blood sugar (glucose) levels to be higher than normal. This increases the risk for developing diabetes. In order to prevent diabetes from developing, your health care provider may recommend a diet and other lifestyle changes to help you:  Control your blood glucose levels.  Improve your cholesterol levels.  Manage your blood pressure. Your health care provider may recommend working with a diet and nutrition specialist (dietitian) to make a meal plan that is best for you. What are tips for following this plan? Lifestyle  Set weight loss goals with the help of your health care team. It is recommended that most people with prediabetes lose 7% of their current body weight.  Exercise for at least 30 minutes at least 5 days a week.  Attend a support group or seek ongoing support from a mental health counselor.  Take over-the-counter and prescription medicines only as told by your health care provider. Reading food labels  Read food labels to check the amount of fat, salt (sodium), and sugar in prepackaged foods. Avoid foods that have: ? Saturated fats. ? Trans fats. ? Added sugars.  Avoid foods that have more than 300 milligrams (mg) of sodium per serving. Limit your daily sodium intake to less than 2,300 mg each day. Shopping  Avoid buying pre-made and processed foods. Cooking  Cook with olive oil. Do not use butter, lard, or ghee.  Bake, broil, grill, or boil foods. Avoid frying. Meal planning   Work with your dietitian to develop an eating plan that is right for you. This may include: ? Tracking how many calories you take in. Use a food diary, notebook, or mobile application to track what you eat at each meal. ? Using the glycemic index (GI) to plan your meals. The index tells you how quickly a food will raise your blood glucose. Choose low-GI foods. These foods  take a longer time to raise blood glucose.  Consider following a Mediterranean diet. This diet includes: ? Several servings each day of fresh fruits and vegetables. ? Eating fish at least twice a week. ? Several servings each day of whole grains, beans, nuts, and seeds. ? Using olive oil instead of other fats. ? Moderate alcohol consumption. ? Eating small amounts of red meat and whole-fat dairy.  If you have high blood pressure, you may need to limit your sodium intake or follow a diet such as the DASH eating plan. DASH is an eating plan that aims to lower high blood pressure. What foods are recommended? The items listed below may not be a complete list. Talk with your dietitian about what dietary choices are best for you. Grains Whole grains, such as whole-wheat or whole-grain breads, crackers, cereals, and pasta. Unsweetened oatmeal. Bulgur. Barley. Quinoa. Brown rice. Corn or whole-wheat flour tortillas or taco shells. Vegetables Lettuce. Spinach. Peas. Beets. Cauliflower. Cabbage. Broccoli. Carrots. Tomatoes. Squash. Eggplant. Herbs. Peppers. Onions. Cucumbers. Brussels sprouts. Fruits Berries. Bananas. Apples. Oranges. Grapes. Papaya. Mango. Pomegranate. Kiwi. Grapefruit. Cherries. Meats and other protein foods Seafood. Poultry without skin. Lean cuts of pork and beef. Tofu. Eggs. Nuts. Beans. Dairy Low-fat or fat-free dairy products, such as yogurt, cottage cheese, and cheese. Beverages Water. Tea. Coffee. Sugar-free or diet soda. Seltzer water. Lowfat or no-fat milk. Milk alternatives, such as soy or almond milk. Fats and oils Olive oil. Canola oil. Sunflower oil. Grapeseed oil. Avocado. Walnuts. Sweets and desserts Sugar-free  or low-fat pudding. Sugar-free or low-fat ice cream and other frozen treats. Seasoning and other foods Herbs. Sodium-free spices. Mustard. Relish. Low-fat, low-sugar ketchup. Low-fat, low-sugar barbecue sauce. Low-fat or fat-free mayonnaise. What foods  are not recommended? The items listed below may not be a complete list. Talk with your dietitian about what dietary choices are best for you. Grains Refined white flour and flour products, such as bread, pasta, snack foods, and cereals. Vegetables Canned vegetables. Frozen vegetables with butter or cream sauce. Fruits Fruits canned with syrup. Meats and other protein foods Fatty cuts of meat. Poultry with skin. Breaded or fried meat. Processed meats. Dairy Full-fat yogurt, cheese, or milk. Beverages Sweetened drinks, such as sweet iced tea and soda. Fats and oils Butter. Lard. Ghee. Sweets and desserts Baked goods, such as cake, cupcakes, pastries, cookies, and cheesecake. Seasoning and other foods Spice mixes with added salt. Ketchup. Barbecue sauce. Mayonnaise. Summary  To prevent diabetes from developing, you may need to make diet and other lifestyle changes to help control blood sugar, improve cholesterol levels, and manage your blood pressure.  Set weight loss goals with the help of your health care team. It is recommended that most people with prediabetes lose 7 percent of their current body weight.  Consider following a Mediterranean diet that includes plenty of fresh fruits and vegetables, whole grains, beans, nuts, seeds, fish, lean meat, low-fat dairy, and healthy oils. This information is not intended to replace advice given to you by your health care provider. Make sure you discuss any questions you have with your health care provider. Document Revised: 07/02/2018 Document Reviewed: 05/14/2016 Elsevier Patient Education  2020 [employer].

## 2019-10-13 NOTE — Progress Notes (Signed)
This visit occurred during the SARS-CoV-2 public health emergency.  Safety protocols were in place, including screening questions prior to the visit, additional usage of staff PPE, and extensive cleaning of exam room while observing appropriate contact time as indicated for disinfecting solutions.  Subjective:     Patient ID: Elizabeth Mcfarland , female    DOB: 07/18/1940 , 79 y.o.   MRN: [id number]   Chief Complaint  Patient presents with  . Diabetes  . Hypertension    HPI  Wt Readings from Last 3 Encounters: 10/13/19 : 133 lb (60.3 kg) 08/01/19 : 137 lb (62.1 kg) 07/14/19 : 137 lb 2 oz (62.2 kg)  Diabetes She presents for her follow-up diabetic visit. She has type 2 diabetes mellitus. Her disease course has been stable. There are no hypoglycemic associated symptoms. Pertinent negatives for hypoglycemia include no dizziness. Pertinent negatives for diabetes include no polydipsia, no polyphagia and no polyuria. There are no hypoglycemic complications. Symptoms are stable. There are no diabetic complications. Risk factors for coronary artery disease include hypertension, sedentary lifestyle and dyslipidemia. Current diabetic treatment includes oral agent (dual therapy). She is compliant with treatment all of the time. Her weight is stable. She is following a generally healthy diet. When asked about meal planning, she reported none. She participates in exercise daily (gardening). Her overall blood glucose range is 90-110 mg/dl. An ACE inhibitor/angiotensin II receptor blocker is being taken. Eye exam is current.  Hypertension This is a chronic problem. The current episode started more than 1 year ago. The problem is unchanged. The problem is controlled. There are no associated agents to hypertension. Risk factors for coronary artery disease include post-menopausal state. Past treatments include ACE inhibitors. The current treatment provides significant improvement. There are no compliance  problems.      Past Medical History:  Diagnosis Date  . Allergy   . Anemia   . Blood transfusion without reported diagnosis   . Breast cancer ([place]) 11/2018   Right breast invasive mammary carcinoma  . Cancer ([place])   . Cataract   . Chronic kidney disease   . Diabetes mellitus without complication ([place])   . Esophageal ulcer with bleeding 05/05/2013   05/05/2013 EGD linear distal esophageal ulcer - cause of hematemesis prior to EGD   . GERD (gastroesophageal reflux disease)   . History of kidney stones   . Hyperlipidemia   . Hypertension   . Kidney cysts   . Personal history of radiation therapy   . Sarcoidosis 1970     Family History  Problem Relation Age of Onset  . Diabetes Sister   . Kidney failure Sister   . Colon polyps Sister   . CAD Maternal Grandmother   . Early death Mother   . Stroke Father   . Stroke Brother   . Colon polyps Brother   . Cancer Brother 63       prostate cancer  . Heart disease Brother   . Emphysema Brother   . Cancer Brother 49       prostate cancer  . Asthma Brother   . Breast cancer Neg Hx   . Colon cancer Neg Hx   . Esophageal cancer Neg Hx   . Rectal cancer Neg Hx   . Stomach cancer Neg Hx      Current Outpatient Medications:  .  acetaminophen (TYLENOL) 500 MG tablet, Take 1,000 mg by mouth every 6 (six) hours as needed for moderate pain or headache. , Disp: , Rfl:  .  amLODipine (NORVASC) 10 MG tablet, TAKE 1 TABLET BY MOUTH EVERY DAY, Disp: 90 tablet, Rfl: 1 .  anastrozole (ARIMIDEX) 1 MG tablet, TAKE 1 TABLET(1 MG) BY MOUTH DAILY, Disp: 30 tablet, Rfl: 3 .  Ascorbic Acid (VITAMIN C) 1000 MG tablet, Take 1,000 mg by mouth daily., Disp: , Rfl:  .  aspirin EC 81 MG tablet, Take 81 mg by mouth daily., Disp: , Rfl:  .  atorvastatin (LIPITOR) 10 MG tablet, Take 1 tablet (10 mg total) by mouth daily., Disp: 90 tablet, Rfl: 0 .  cholecalciferol (VITAMIN D) 1000 UNITS tablet, Take 1,000 Units by mouth daily. , Disp: , Rfl:  .  glimepiride  (AMARYL) 4 MG tablet, Take 1 tablet (4 mg total) by mouth every morning., Disp: 90 tablet, Rfl: 1 .  Multiple Vitamin (MULTIVITAMIN WITH MINERALS) TABS tablet, Take 1 tablet by mouth daily. , Disp: , Rfl:  .  ONETOUCH ULTRA test strip, USE AS DIRECTED TO CHECK BLOOD SUGAR TWICE DAILY, Disp: 100 strip, Rfl: 3 .  Polyethyl Glycol-Propyl Glycol (SYSTANE OP), Place 1 drop into both eyes 2 (two) times daily. , Disp: , Rfl:  .  polyethylene glycol (MIRALAX / GLYCOLAX) packet, Take 17 g by mouth daily. , Disp: , Rfl:  .  sitaGLIPtin (JANUVIA) 50 MG tablet, Take 1 tablet (50 mg total) by mouth daily., Disp: 90 tablet, Rfl: 1   Allergies  Allergen Reactions  . Codeine Nausea And Vomiting  . Percocet [Oxycodone-Acetaminophen] Nausea And Vomiting     Review of Systems  Constitutional: Negative.   Respiratory: Negative.   Cardiovascular: Negative.   Gastrointestinal: Negative.   Endocrine: Negative for polydipsia, polyphagia and polyuria.  Musculoskeletal: Positive for arthralgias (right hand pain).  Neurological: Negative for dizziness.  Psychiatric/Behavioral: Negative.      Today's Vitals   10/13/19 0951  BP: 140/72  Pulse: 65  Temp: 98.2 F (36.8 C)  Weight: 133 lb (60.3 kg)  Height: 5' 1.8" (1.57 m)  PainSc: 0-No pain   Body mass index is 24.48 kg/m.   Objective:  Physical Exam Constitutional:      General: She is not in acute distress.    Appearance: Normal appearance. She is normal weight.  Cardiovascular:     Rate and Rhythm: Normal rate and regular rhythm.     Pulses: Normal pulses.     Heart sounds: Normal heart sounds. No murmur heard.      Comments: Bilateral DP pulses are 2+ Pulmonary:     Effort: Pulmonary effort is normal. No respiratory distress.     Breath sounds: Normal breath sounds.  Musculoskeletal:        General: Normal range of motion.     Right lower leg: No edema.     Left lower leg: No edema.  Feet:     Comments: Left foot numbness Skin:     General: Skin is warm and dry.     Capillary Refill: Capillary refill takes less than 2 seconds.  Neurological:     General: No focal deficit present.     Mental Status: She is alert and oriented to person, place, and time.  Psychiatric:        Mood and Affect: Mood normal.        Behavior: Behavior normal.        Thought Content: Thought content normal.        Judgment: Judgment normal.         Assessment And Plan:     1.  Type 2 diabetes mellitus with other specified complication, without long-term current use of insulin (HCC)  Chronic, controlled  Continue with current medications  Encouraged to limit intake of sugary foods and drinks  Encouraged to increase physical activity to 150 minutes per week  2. Essential hypertension . B/P is fairly controlled.  . The importance of regular exercise and dietary modification was stressed to the patient.  . Stressed importance of losing ten percent of her body weight to help with B/P control.   3. Numbness and tingling of foot Comments: will check metabolic causes, if negative and continues may need to get doppler - Vitamin B12 - TSH  4. Encounter for hepatitis C screening test for low risk patient  Will check Hepatitis C screening due to recent recommendations to screen all adults 18 years and older - Hepatitis C antibody     Patient was given opportunity to ask questions. Patient verbalized understanding of the plan and was able to repeat key elements of the plan. All questions were answered to their satisfaction.  [name], FNP   I, [name], FNP, have reviewed all documentation for this visit. The documentation on 10/13/19 for the exam, diagnosis, procedures, and orders are all accurate and complete.  THE PATIENT IS ENCOURAGED TO PRACTICE SOCIAL DISTANCING DUE TO THE COVID-19 PANDEMIC.

## 2019-10-14 LAB — VITAMIN B12: Vitamin B-12: 1859 pg/mL — ABNORMAL HIGH (ref 232–1245)

## 2019-10-14 LAB — HEPATITIS C ANTIBODY: Hep C Virus Ab: <0.1 s/co ratio (ref 0.0–0.9)

## 2019-10-14 LAB — TSH: TSH: 1.33 u[IU]/mL (ref 0.450–4.500)

## 2019-12-20 ENCOUNTER — Other Ambulatory Visit: Payer: Self-pay

## 2019-12-20 ENCOUNTER — Ambulatory Visit (INDEPENDENT_AMBULATORY_CARE_PROVIDER_SITE_OTHER): Payer: Medicare Other | Admitting: Nurse Practitioner

## 2019-12-20 ENCOUNTER — Encounter: Payer: Self-pay | Admitting: Nurse Practitioner

## 2019-12-20 VITALS — BP 132/64 | HR 70 | Temp 98.0°F | Ht 61.0 in | Wt 132.6 lb

## 2019-12-20 DIAGNOSIS — E1169 Type 2 diabetes mellitus with other specified complication: Secondary | ICD-10-CM

## 2019-12-20 DIAGNOSIS — Z23 Encounter for immunization: Secondary | ICD-10-CM | POA: Diagnosis not present

## 2019-12-20 DIAGNOSIS — R10A0 Flank pain, unspecified side: Secondary | ICD-10-CM

## 2019-12-20 DIAGNOSIS — E663 Overweight: Secondary | ICD-10-CM

## 2019-12-20 DIAGNOSIS — R5383 Other fatigue: Secondary | ICD-10-CM | POA: Diagnosis not present

## 2019-12-20 DIAGNOSIS — R109 Unspecified abdominal pain: Secondary | ICD-10-CM | POA: Diagnosis not present

## 2019-12-20 DIAGNOSIS — I10 Essential (primary) hypertension: Secondary | ICD-10-CM

## 2019-12-20 DIAGNOSIS — Z6825 Body mass index (BMI) 25.0-25.9, adult: Secondary | ICD-10-CM

## 2019-12-20 LAB — POCT URINALYSIS DIPSTICK
Bilirubin, UA: NEGATIVE
Blood, UA: NEGATIVE
Glucose, UA: NEGATIVE
Ketones, UA: NEGATIVE
Leukocytes, UA: NEGATIVE
Nitrite, UA: NEGATIVE
Protein, UA: NEGATIVE
Spec Grav, UA: 1.02
Urobilinogen, UA: 0.2 U/dL
pH, UA: 7.5

## 2019-12-20 MED ORDER — GLIMEPIRIDE 4 MG PO TABS: 4.0000 mg | ORAL_TABLET | ORAL | 1 refills | Status: DC

## 2019-12-20 NOTE — Patient Instructions (Signed)
Diabetes Mellitus and Exercise Exercising regularly is important for your overall health, especially when you have diabetes (diabetes mellitus). Exercising is not only about losing weight. It has many other health benefits, such as increasing muscle strength and bone density and reducing body fat and stress. This leads to improved fitness, flexibility, and endurance, all of which result in better overall health. Exercise has additional benefits for people with diabetes, including:  Reducing appetite.  Helping to lower and control blood glucose.  Lowering blood pressure.  Helping to control amounts of fatty substances (lipids) in the blood, such as cholesterol and triglycerides.  Helping the body to respond better to insulin (improving insulin sensitivity).  Reducing how much insulin the body needs.  Decreasing the risk for heart disease by: ? Lowering cholesterol and triglyceride levels. ? Increasing the levels of good cholesterol. ? Lowering blood glucose levels. What is my activity plan? Your health care provider or certified diabetes educator can help you make a plan for the type and frequency of exercise (activity plan) that works for you. Make sure that you:  Do at least 150 minutes of moderate-intensity or vigorous-intensity exercise each week. This could be brisk walking, biking, or water aerobics. ? Do stretching and strength exercises, such as yoga or weightlifting, at least 2 times a week. ? Spread out your activity over at least 3 days of the week.  Get some form of physical activity every day. ? Do not go more than 2 days in a row without some kind of physical activity. ? Avoid being inactive for more than 30 minutes at a time. Take frequent breaks to walk or stretch.  Choose a type of exercise or activity that you enjoy, and set realistic goals.  Start slowly, and gradually increase the intensity of your exercise over time. What do I need to know about managing my  diabetes?   Check your blood glucose before and after exercising. ? If your blood glucose is 240 mg/dL (13.3 mmol/L) or higher before you exercise, check your urine for ketones. If you have ketones in your urine, do not exercise until your blood glucose returns to normal. ? If your blood glucose is 100 mg/dL (5.6 mmol/L) or lower, eat a snack containing 15-20 grams of carbohydrate. Check your blood glucose 15 minutes after the snack to make sure that your level is above 100 mg/dL (5.6 mmol/L) before you start your exercise.  Know the symptoms of low blood glucose (hypoglycemia) and how to treat it. Your risk for hypoglycemia increases during and after exercise. Common symptoms of hypoglycemia can include: ? Hunger. ? Anxiety. ? Sweating and feeling clammy. ? Confusion. ? Dizziness or feeling light-headed. ? Increased heart rate or palpitations. ? Blurry vision. ? Tingling or numbness around the mouth, lips, or tongue. ? Tremors or shakes. ? Irritability.  Keep a rapid-acting carbohydrate snack available before, during, and after exercise to help prevent or treat hypoglycemia.  Avoid injecting insulin into areas of the body that are going to be exercised. For example, avoid injecting insulin into: ? The arms, when playing tennis. ? The legs, when jogging.  Keep records of your exercise habits. Doing this can help you and your health care provider adjust your diabetes management plan as needed. Write down: ? Food that you eat before and after you exercise. ? Blood glucose levels before and after you exercise. ? The type and amount of exercise you have done. ? When your insulin is expected to peak, if you use   insulin. Avoid exercising at times when your insulin is peaking.  When you start a new exercise or activity, work with your health care provider to make sure the activity is safe for you, and to adjust your insulin, medicines, or food intake as needed.  Drink plenty of water while  you exercise to prevent dehydration or heat stroke. Drink enough fluid to keep your urine clear or pale yellow. Summary  Exercising regularly is important for your overall health, especially when you have diabetes (diabetes mellitus).  Exercising has many health benefits, such as increasing muscle strength and bone density and reducing body fat and stress.  Your health care provider or certified diabetes educator can help you make a plan for the type and frequency of exercise (activity plan) that works for you.  When you start a new exercise or activity, work with your health care provider to make sure the activity is safe for you, and to adjust your insulin, medicines, or food intake as needed. This information is not intended to replace advice given to you by your health care provider. Make sure you discuss any questions you have with your health care provider. Document Revised: 10/02/2016 Document Reviewed: 08/20/2015 Elsevier Patient Education  2020 [employer]  

## 2019-12-20 NOTE — Progress Notes (Signed)
I,Tianna Badgett,acting as a Education administrator for [employer], FNP.,have documented all relevant documentation on the behalf of [name], FNP,as directed by  [name], FNP while in the presence of [name], [place].  This visit occurred during the SARS-CoV-2 public health emergency.  Safety protocols were in place, including screening questions prior to the visit, additional usage of staff PPE, and extensive cleaning of exam room while observing appropriate contact time as indicated for disinfecting solutions.  Subjective:     Patient ID: Elizabeth Mcfarland , female    DOB: February 28, 1941 , 79 y.o.   MRN: [id number]   Chief Complaint  Patient presents with  . Diabetes  . Hypertension    HPI  Patient is here for dm check. She is compliant with medications. She reports left side pain x  2 weeks this past Sunday she has been feeling weak and not feeling good.  She has had kidney stones in the past and has a urologist  Wt Readings from Last 3 Encounters: 12/20/19 : 132 lb 9.6 oz (60.1 kg) 10/13/19 : 133 lb (60.3 kg) 08/01/19 : 137 lb (62.1 kg)     Diabetes She presents for her follow-up diabetic visit. She has type 2 diabetes mellitus. Her disease course has been stable. There are no hypoglycemic associated symptoms. Pertinent negatives for hypoglycemia include no dizziness. Associated symptoms include fatigue. Pertinent negatives for diabetes include no polydipsia, no polyphagia and no polyuria. There are no hypoglycemic complications. Symptoms are stable. There are no diabetic complications. Risk factors for coronary artery disease include hypertension, sedentary lifestyle and dyslipidemia. Current diabetic treatment includes oral agent (dual therapy). She is compliant with treatment all of the time. Her weight is stable. She is following a generally healthy diet. When asked about meal planning, she reported none. She participates in exercise daily (gardening). (Blood sugars are ranging 77-235  (ate bad)) An ACE inhibitor/angiotensin II receptor blocker is being taken. Eye exam is current.  Hypertension This is a chronic problem. The current episode started more than 1 year ago. The problem is unchanged. The problem is controlled. There are no associated agents to hypertension. Risk factors for coronary artery disease include post-menopausal state. Past treatments include ACE inhibitors. The current treatment provides significant improvement. There are no compliance problems.      Past Medical History:  Diagnosis Date  . Allergy   . Anemia   . Blood transfusion without reported diagnosis   . Breast cancer ([place]) 11/2018   Right breast invasive mammary carcinoma  . Cancer ([place])   . Cataract   . Chronic kidney disease   . Diabetes mellitus without complication ([place])   . Esophageal ulcer with bleeding 05/05/2013   05/05/2013 EGD linear distal esophageal ulcer - cause of hematemesis prior to EGD   . GERD (gastroesophageal reflux disease)   . History of kidney stones   . Hyperlipidemia   . Hypertension   . Kidney cysts   . Personal history of radiation therapy   . Sarcoidosis 1970     Family History  Problem Relation Age of Onset  . Diabetes Sister   . Kidney failure Sister   . Colon polyps Sister   . CAD Maternal Grandmother   . Early death Mother   . Stroke Father   . Stroke Brother   . Colon polyps Brother   . Cancer Brother 27       prostate cancer  . Heart disease Brother   . Emphysema Brother   . Cancer  Brother 73       prostate cancer  . Asthma Brother   . Breast cancer Neg Hx   . Colon cancer Neg Hx   . Esophageal cancer Neg Hx   . Rectal cancer Neg Hx   . Stomach cancer Neg Hx      Current Outpatient Medications:  .  acetaminophen (TYLENOL) 500 MG tablet, Take 1,000 mg by mouth every 6 (six) hours as needed for moderate pain or headache. , Disp: , Rfl:  .  amLODipine (NORVASC) 10 MG tablet, TAKE 1 TABLET BY MOUTH EVERY DAY, Disp: 90 tablet, Rfl: 1 .   anastrozole (ARIMIDEX) 1 MG tablet, TAKE 1 TABLET(1 MG) BY MOUTH DAILY, Disp: 30 tablet, Rfl: 3 .  Ascorbic Acid (VITAMIN C) 1000 MG tablet, Take 1,000 mg by mouth daily., Disp: , Rfl:  .  aspirin EC 81 MG tablet, Take 81 mg by mouth daily., Disp: , Rfl:  .  atorvastatin (LIPITOR) 10 MG tablet, Take 1 tablet (10 mg total) by mouth daily., Disp: 90 tablet, Rfl: 0 .  cholecalciferol (VITAMIN D) 1000 UNITS tablet, Take 1,000 Units by mouth daily. , Disp: , Rfl:  .  Multiple Vitamin (MULTIVITAMIN WITH MINERALS) TABS tablet, Take 1 tablet by mouth daily. , Disp: , Rfl:  .  Polyethyl Glycol-Propyl Glycol (SYSTANE OP), Place 1 drop into both eyes 2 (two) times daily. , Disp: , Rfl:  .  polyethylene glycol (MIRALAX / GLYCOLAX) packet, Take 17 g by mouth daily. , Disp: , Rfl:  .  sitaGLIPtin (JANUVIA) 50 MG tablet, Take 1 tablet (50 mg total) by mouth daily., Disp: 90 tablet, Rfl: 1 .  glimepiride (AMARYL) 4 MG tablet, Take 1 tablet (4 mg total) by mouth every morning., Disp: 90 tablet, Rfl: 1 .  ONETOUCH ULTRA test strip, USE AS DIRECTED TO CHECK BLOOD SUGAR TWICE DAILY, Disp: 100 strip, Rfl: 3   Allergies  Allergen Reactions  . Codeine Nausea And Vomiting  . Percocet [Oxycodone-Acetaminophen] Nausea And Vomiting     Review of Systems  Constitutional: Positive for fatigue.  Respiratory: Negative.   Cardiovascular: Negative.   Gastrointestinal: Negative.        Pain from back radiating around to upper abdomen below rib cage on left. Last bowel movement today.   Endocrine: Negative for polydipsia, polyphagia and polyuria.  Genitourinary: Positive for flank pain (8/10 rating).       Left side. 2 weeks   Neurological: Negative.  Negative for dizziness.  Psychiatric/Behavioral: Negative.      Today's Vitals   12/20/19 1431  BP: 132/64  Pulse: 70  Temp: 98 F (36.7 C)  TempSrc: Oral  Weight: 132 lb 9.6 oz (60.1 kg)  Height: 5' 1" (1.549 m)   Body mass index is 25.05 kg/m.   Objective:   Physical Exam Vitals reviewed.  Constitutional:      General: She is not in acute distress.    Appearance: Normal appearance. She is obese.  Cardiovascular:     Rate and Rhythm: Normal rate and regular rhythm.     Pulses: Normal pulses.     Heart sounds: Normal heart sounds. No murmur heard.   Pulmonary:     Effort: Pulmonary effort is normal. No respiratory distress.     Breath sounds: Normal breath sounds.  Abdominal:     General: Abdomen is flat. Bowel sounds are normal.     Palpations: Abdomen is soft.     Tenderness: There is no abdominal tenderness. There is  no right CVA tenderness or left CVA tenderness.  Skin:    Capillary Refill: Capillary refill takes less than 2 seconds.  Neurological:     General: No focal deficit present.     Mental Status: She is alert and oriented to person, place, and time.     Cranial Nerves: No cranial nerve deficit.  Psychiatric:        Mood and Affect: Mood normal.        Behavior: Behavior normal.        Thought Content: Thought content normal.        Judgment: Judgment normal.         Assessment And Plan:     1. Type 2 diabetes mellitus with other specified complication, without long-term current use of insulin (HCC)  Chronic, stable  Continue with current medications  Continue to avoid sugary foods and drinks - CMP14+EGFR - Hemoglobin A1c  2. Essential hypertension  Chronic, fair control  Continue with current medications - CMP14+EGFR  3. Flank pain  Urinalysis is normal  Negative for CVA tenderness - CBC - POCT Urinalysis Dipstick (81002)  4. Need for influenza vaccination  Influenza vaccine administered  Encouraged to take Tylenol as needed for fever or muscle aches. - Flu Vaccine QUAD High Dose(Fluad)  5. Fatigue, unspecified type  Will check for metabolic causes   This could be related to deconditioning as well. - CBC - TSH  6. Overweight with body mass index (BMI) of 25 to 25.9 in  adult  Slightly overweight continue with eating healthy diet and exercise as tolerated   Patient was given opportunity to ask questions. Patient verbalized understanding of the plan and was able to repeat key elements of the plan. All questions were answered to their satisfaction.   Teola Bradley, FNP, have reviewed all documentation for this visit. The documentation on 01/01/20 for the exam, diagnosis, procedures, and orders are all accurate and complete.  THE PATIENT IS ENCOURAGED TO PRACTICE SOCIAL DISTANCING DUE TO THE COVID-19 PANDEMIC.

## 2019-12-21 LAB — CMP14+EGFR
ALT: 25 IU/L (ref 0–32)
AST: 29 IU/L (ref 0–40)
Albumin/Globulin Ratio: 1.9 (ref 1.2–2.2)
Albumin: 4.6 g/dL (ref 3.7–4.7)
Alkaline Phosphatase: 77 IU/L (ref 44–121)
BUN/Creatinine Ratio: 15 (ref 12–28)
BUN: 15 mg/dL (ref 8–27)
Bilirubin Total: 0.4 mg/dL (ref 0.0–1.2)
CO2: 27 mmol/L (ref 20–29)
Calcium: 10.3 mg/dL (ref 8.7–10.3)
Chloride: 100 mmol/L (ref 96–106)
Creatinine, Ser: 0.98 mg/dL (ref 0.57–1.00)
GFR calc Af Amer: 64 mL/min/{1.73_m2} (ref >59)
GFR calc non Af Amer: 56 mL/min/{1.73_m2} — ABNORMAL LOW (ref >59)
Globulin, Total: 2.4 g/dL (ref 1.5–4.5)
Glucose: 117 mg/dL — ABNORMAL HIGH (ref 65–99)
Potassium: 4.9 mmol/L (ref 3.5–5.2)
Sodium: 141 mmol/L (ref 134–144)
Total Protein: 7 g/dL (ref 6.0–8.5)

## 2019-12-21 LAB — CBC
Hematocrit: 35.1 % (ref 34.0–46.6)
Hemoglobin: 11.9 g/dL (ref 11.1–15.9)
MCH: 30.7 pg (ref 26.6–33.0)
MCHC: 33.9 g/dL (ref 31.5–35.7)
MCV: 91 fL (ref 79–97)
Platelets: 268 10*3/uL (ref 150–450)
RBC: 3.87 x10E6/uL (ref 3.77–5.28)
RDW: 11.8 % (ref 11.7–15.4)
WBC: 5.7 10*3/uL (ref 3.4–10.8)

## 2019-12-21 LAB — HEMOGLOBIN A1C
Est. average glucose Bld gHb Est-mCnc: 128 mg/dL
Hgb A1c MFr Bld: 6.1 % — ABNORMAL HIGH (ref 4.8–5.6)

## 2019-12-21 LAB — TSH: TSH: 0.802 u[IU]/mL (ref 0.450–4.500)

## 2019-12-26 ENCOUNTER — Other Ambulatory Visit: Payer: Self-pay | Admitting: Nurse Practitioner

## 2019-12-28 DIAGNOSIS — N200 Calculus of kidney: Secondary | ICD-10-CM | POA: Diagnosis not present

## 2019-12-31 ENCOUNTER — Other Ambulatory Visit: Payer: Self-pay | Admitting: Nurse Practitioner

## 2019-12-31 DIAGNOSIS — IMO0002 Reserved for concepts with insufficient information to code with codable children: Secondary | ICD-10-CM

## 2019-12-31 DIAGNOSIS — E1165 Type 2 diabetes mellitus with hyperglycemia: Secondary | ICD-10-CM

## 2020-01-02 ENCOUNTER — Other Ambulatory Visit: Payer: Self-pay | Admitting: Nurse Practitioner

## 2020-01-02 DIAGNOSIS — IMO0002 Reserved for concepts with insufficient information to code with codable children: Secondary | ICD-10-CM

## 2020-01-02 DIAGNOSIS — E1165 Type 2 diabetes mellitus with hyperglycemia: Secondary | ICD-10-CM

## 2020-01-09 ENCOUNTER — Telehealth: Payer: Self-pay

## 2020-01-09 ENCOUNTER — Other Ambulatory Visit: Payer: Self-pay | Admitting: Nurse Practitioner

## 2020-01-09 NOTE — Telephone Encounter (Signed)
Per JM pt kidney levels are ok pt can keep doing what she is doing. Pt notified

## 2020-01-17 ENCOUNTER — Other Ambulatory Visit: Payer: Self-pay

## 2020-01-17 ENCOUNTER — Encounter: Payer: Self-pay | Admitting: Nurse Practitioner

## 2020-01-17 ENCOUNTER — Ambulatory Visit (INDEPENDENT_AMBULATORY_CARE_PROVIDER_SITE_OTHER): Payer: Medicare Other | Admitting: Nurse Practitioner

## 2020-01-17 VITALS — BP 142/78 | HR 65 | Temp 98.5°F | Ht 61.0 in | Wt 133.6 lb

## 2020-01-17 DIAGNOSIS — E1169 Type 2 diabetes mellitus with other specified complication: Secondary | ICD-10-CM | POA: Diagnosis not present

## 2020-01-17 DIAGNOSIS — R1032 Left lower quadrant pain: Secondary | ICD-10-CM | POA: Diagnosis not present

## 2020-01-17 DIAGNOSIS — I10 Essential (primary) hypertension: Secondary | ICD-10-CM

## 2020-01-17 NOTE — Progress Notes (Signed)
I,[name] as a Education administrator for [employer], FNP.,have documented all relevant documentation on the behalf of [name], FNP,as directed by  [name], FNP while in the presence of [name], [place]. This visit occurred during the SARS-CoV-2 public health emergency.  Safety protocols were in place, including screening questions prior to the visit, additional usage of staff PPE, and extensive cleaning of exam room while observing appropriate contact time as indicated for disinfecting solutions.  Subjective:     Patient ID: Elizabeth Mcfarland , female    DOB: 1940-08-04 , 79 y.o.   MRN: [id number]   Chief Complaint  Patient presents with  . Diabetes  . Abdominal Pain    patient stated her stomach has been hurting and has worsened.  . Hypertension    HPI  Patient here for a f/u on her dm   Wt Readings from Last 3 Encounters: 01/17/20 : 133 lb 9.6 oz (60.6 kg) 12/20/19 : 132 lb 9.6 oz (60.1 kg) 10/13/19 : 133 lb (60.3 kg)  Her pain level is 4-5 unless getting out of bed.    Abdominal Pain This is a new problem. The current episode started more than 1 month ago (3 months seems to be getting worse). The onset quality is gradual. The problem occurs intermittently (mostly in the morning). The pain is located in the LLQ. The quality of the pain is aching. The abdominal pain radiates to the LLQ. Pertinent negatives include no anorexia, arthralgias or headaches. Nothing aggravates the pain. The pain is relieved by nothing. Prior workup: she has been followed by nephrology.     Past Medical History:  Diagnosis Date  . Allergy   . Anemia   . Blood transfusion without reported diagnosis   . Breast cancer ([place]) 11/2018   Right breast invasive mammary carcinoma  . Cancer ([place])   . Cataract   . Chronic kidney disease   . Diabetes mellitus without complication ([place])   . Esophageal ulcer with bleeding 05/05/2013   05/05/2013 EGD linear distal esophageal ulcer - cause of  hematemesis prior to EGD   . GERD (gastroesophageal reflux disease)   . History of kidney stones   . Hyperlipidemia   . Hypertension   . Kidney cysts   . Personal history of radiation therapy   . Sarcoidosis 1970     Family History  Problem Relation Age of Onset  . Diabetes Sister   . Kidney failure Sister   . Colon polyps Sister   . CAD Maternal Grandmother   . Early death Mother   . Stroke Father   . Stroke Brother   . Colon polyps Brother   . Cancer Brother 46       prostate cancer  . Heart disease Brother   . Emphysema Brother   . Cancer Brother 4       prostate cancer  . Asthma Brother   . Breast cancer Neg Hx   . Colon cancer Neg Hx   . Esophageal cancer Neg Hx   . Rectal cancer Neg Hx   . Stomach cancer Neg Hx      Current Outpatient Medications:  .  acetaminophen (TYLENOL) 500 MG tablet, Take 1,000 mg by mouth every 6 (six) hours as needed for moderate pain or headache. , Disp: , Rfl:  .  amLODipine (NORVASC) 10 MG tablet, TAKE 1 TABLET BY MOUTH EVERY DAY, Disp: 90 tablet, Rfl: 1 .  anastrozole (ARIMIDEX) 1 MG tablet, TAKE 1 TABLET(1 MG) BY  MOUTH DAILY, Disp: 30 tablet, Rfl: 3 .  Ascorbic Acid (VITAMIN C) 1000 MG tablet, Take 1,000 mg by mouth daily., Disp: , Rfl:  .  aspirin EC 81 MG tablet, Take 81 mg by mouth daily., Disp: , Rfl:  .  atorvastatin (LIPITOR) 10 MG tablet, TAKE 1 TABLET(10 MG) BY MOUTH DAILY, Disp: 90 tablet, Rfl: 0 .  cholecalciferol (VITAMIN D) 1000 UNITS tablet, Take 1,000 Units by mouth daily. , Disp: , Rfl:  .  glimepiride (AMARYL) 4 MG tablet, Take 1 tablet (4 mg total) by mouth every morning., Disp: 90 tablet, Rfl: 1 .  JANUVIA 50 MG tablet, TAKE 1 TABLET(50 MG) BY MOUTH DAILY, Disp: 90 tablet, Rfl: 1 .  Multiple Vitamin (MULTIVITAMIN WITH MINERALS) TABS tablet, Take 1 tablet by mouth daily. , Disp: , Rfl:  .  ONETOUCH ULTRA test strip, USE AS DIRECTED TO CHECK BLOOD SUGAR TWICE DAILY, Disp: 100 strip, Rfl: 3 .  Polyethyl Glycol-Propyl  Glycol (SYSTANE OP), Place 1 drop into both eyes 2 (two) times daily. , Disp: , Rfl:  .  polyethylene glycol (MIRALAX / GLYCOLAX) packet, Take 17 g by mouth daily. , Disp: , Rfl:    Allergies  Allergen Reactions  . Codeine Nausea And Vomiting  . Percocet [Oxycodone-Acetaminophen] Nausea And Vomiting     Review of Systems  Constitutional: Negative.   Respiratory: Negative.   Cardiovascular: Negative.  Negative for chest pain, palpitations and leg swelling.  Gastrointestinal: Positive for abdominal pain. Negative for anorexia.  Musculoskeletal: Negative.  Negative for arthralgias.  Skin: Negative.   Neurological: Negative for dizziness and headaches.  Psychiatric/Behavioral: Negative.      Today's Vitals   01/17/20 0938 01/17/20 1019  BP: (!) 160/98 (!) 142/78  Pulse: 65   Temp: 98.5 F (36.9 C)   TempSrc: Oral   Weight: 133 lb 9.6 oz (60.6 kg)   Height: 5\' 1"  (1.549 m)   PainSc: 0-No pain    Body mass index is 25.24 kg/m.   Objective:  Physical Exam Constitutional:      General: She is not in acute distress.    Appearance: She is well-developed.  Abdominal:     General: Abdomen is flat. Bowel sounds are normal.     Palpations: Abdomen is soft. There is no shifting dullness or mass.     Tenderness: There is abdominal tenderness (left lower quadrant) in the left lower quadrant. There is no right CVA tenderness or guarding. Negative signs include Murphy's sign.     Comments: Dullness to left lower quadrant  Skin:    General: Skin is warm and dry.     Capillary Refill: Capillary refill takes less than 2 seconds.  Neurological:     Mental Status: She is alert.         Assessment And Plan:     1. Left lower quadrant abdominal pain  Persistent abdomen pain worse in am  She has been seen by her urologist as well and patient reports she still has a kidney stone  Tenderness to left lower quadrant   She does have dullness on percussion to left lower quadrant  I  will send her for a CT of abdomen for further evaluation - CT Abdomen Pelvis Wo Contrast; Future  2. Type 2 diabetes mellitus with other specified complication, without long-term current use of insulin (HCC)  Chronic, her blood sugar was elevated yesterday morning which she blames on eating a heavy meal on Sunday  She has recently had her  HgbA1c done which was 5.9   3. Essential hypertension  Blood pressure is elevated today, she took her medications just 30 minutes prior to getting to the office the repeat was down to 142/78  She is encouraged to continue to avoid high salt diet.   Patient was given opportunity to ask questions. Patient verbalized understanding of the plan and was able to repeat key elements of the plan. All questions were answered to their satisfaction.   Teola Bradley, FNP, have reviewed all documentation for this visit. The documentation on 01/17/20 for the exam, diagnosis, procedures, and orders are all accurate and complete.  THE PATIENT IS ENCOURAGED TO PRACTICE SOCIAL DISTANCING DUE TO THE COVID-19 PANDEMIC.

## 2020-01-17 NOTE — Patient Instructions (Signed)

## 2020-01-24 DIAGNOSIS — C50411 Malignant neoplasm of upper-outer quadrant of right female breast: Secondary | ICD-10-CM | POA: Diagnosis not present

## 2020-01-24 DIAGNOSIS — Z170 Estrogen receptor positive status [ER+]: Secondary | ICD-10-CM | POA: Diagnosis not present

## 2020-01-26 ENCOUNTER — Ambulatory Visit
Admission: RE | Admit: 2020-01-26 | Discharge: 2020-01-26 | Disposition: A | Discharge status: Home / Self Care | Payer: Medicare Other | Source: Ambulatory Visit | Attending: Nurse Practitioner | Admitting: Nurse Practitioner

## 2020-01-26 ENCOUNTER — Other Ambulatory Visit: Payer: Self-pay

## 2020-01-26 DIAGNOSIS — N200 Calculus of kidney: Secondary | ICD-10-CM | POA: Diagnosis not present

## 2020-01-26 DIAGNOSIS — R1032 Left lower quadrant pain: Secondary | ICD-10-CM

## 2020-01-26 DIAGNOSIS — I700 Atherosclerosis of aorta: Secondary | ICD-10-CM | POA: Diagnosis not present

## 2020-01-26 DIAGNOSIS — N281 Cyst of kidney, acquired: Secondary | ICD-10-CM | POA: Diagnosis not present

## 2020-01-29 ENCOUNTER — Other Ambulatory Visit: Payer: Self-pay | Admitting: Nurse Practitioner

## 2020-01-29 DIAGNOSIS — I700 Atherosclerosis of aorta: Secondary | ICD-10-CM

## 2020-01-29 MED ORDER — ATORVASTATIN CALCIUM 20 MG PO TABS: 20.0000 mg | ORAL_TABLET | Freq: Every day | ORAL | 1 refills | Status: DC

## 2020-02-04 ENCOUNTER — Other Ambulatory Visit: Payer: Self-pay | Admitting: Nurse Practitioner

## 2020-02-13 DIAGNOSIS — H401132 Primary open-angle glaucoma, bilateral, moderate stage: Secondary | ICD-10-CM | POA: Diagnosis not present

## 2020-03-30 NOTE — Progress Notes (Signed)
Elizabeth Mcfarland   [phone] [phone]   Clinic Follow up Note   Patient Care Team: [name], [place] as PCP - General (General Practice) [name], RN as Oncology Nurse Navigator [name], RN as Oncology Nurse Navigator [name], MD as Consulting Physician (Hematology) [name], MD as Consulting Physician (General Surgery) [name], MD as Consulting Physician (Radiation Oncology) Alla Feeling, NP as Nurse Practitioner (Nurse Practitioner)  Date of Service:  04/02/2020  CHIEF COMPLAINT: F/u of right breast cancer  SUMMARY OF ONCOLOGIC HISTORY: Oncology History Overview Note  Cancer Staging Malignant neoplasm of upper-inner quadrant of right breast in female, estrogen receptor positive ([place]) Staging form: Breast, AJCC 8th Edition - Clinical stage from 10/07/2018: cT2, cN0, cM0, GX, ER+, PR+, HER2: Equivocal - Signed by [name], MD on 10/12/2018 - Pathologic stage from 12/25/2018: Stage IA (pT2, pN0, cM0, G2, ER+, PR+, HER2-, Oncotype DX score: 21) - Signed by [name], MD on 02/01/2019    Malignant neoplasm of upper-inner quadrant of right breast in female, estrogen receptor positive ([place])  10/01/2018 Imaging   Bone Density Scan 10/01/18  IMPRESSION Lowest T-score of -0.9 at left femur neck (NORMAL) T-score DualFemur Neck Left  10/01/2018    76.1         -0.9    0.916 g/cm2   AP Spine  L1-L4      10/01/2018    76.1         1.2     1.326 g/cm2   DualFemur Total Mean 10/01/2018    76.1         0.1     1.024 g/cm2   10/05/2018 Mammogram   Diagnostic Mammogram 10/05/18 IMPRESSION: 1. Highly suspicious right breast mass 1 o'clock position 2 cm from the nipple on the right. It measures 2.1 x 1.7 x 1.5 cm. Corresponding with the screening mammographic findings. Recommendation is for ultrasound-guided biopsy. 2. Indeterminate left breast mass at the 10 o'clock position 9 cm from the nipple. It measures 1.3 x 0.5 x 0.3  cm. This may represent a benign etiology such as a degenerating fibroadenoma. However, ultrasound-guided biopsy for definitive tissue diagnosis is recommended. 3. No suspicious lymphadenopathy bilaterally.   10/07/2018 Cancer Staging   Staging form: Breast, AJCC 8th Edition - Clinical stage from 10/07/2018: cT2, cN0, cM0, GX, ER+, PR+, HER2: Equivocal - Signed by [name], MD on 10/12/2018   10/07/2018 Initial Biopsy   Diagnosis 10/07/18 1. Breast, right, needle core biopsy, 1 o'clock, 2cmfn - INVASIVE MAMMARY CARCINOMA WITH CALCIFICATIONS. SEE NOTE 2. Breast, left, needle core biopsy, 10 o'clock - FIBROADENOMA WITH DYSTROPHIC CALCIFICATIONS - NEGATIVE FOR CARCINOMA   10/07/2018 Receptors her2   By immunohistochemistry, the tumor cells are EQUIVOCAL for Her2 (2+). HER2 by [place] will be PERFORMED and the RESULTS REPORTED SEPARATELY Estrogen Receptor: 100%, POSITIVE, STRONG STAINING INTENSITY Progesterone Receptor: 70%, POSITIVE, STRONG STAINING INTENSITY Proliferation Marker Ki67: 10%   10/11/2018 Initial Diagnosis   Malignant neoplasm of upper-inner quadrant of right breast in female, estrogen receptor positive ([place])   10/2018 -  Neo-Adjuvant Anti-estrogen oral therapy   She started anastrozole end of 10/2018 before surgery.   11/25/2018 Surgery   BILATERAL BREAST LUMPECTOMIES  WITH BILATERAL  RADIOACTIVE SEEDS AND RIGHT SENTINEL LYMPH NODE BIOPSY by Dr [name]  11/24/28   11/25/2018 Pathology Results   Diagnosis 11/25/18 1. Breast, lumpectomy, Left w/seed - FIBROADENOMA WITH CALCIFICATIONS. - BIOPSY SITE CHANGES. 2. Lymph node, sentinel, biopsy,  Right Axillary #1 - ONE LYMPH NODE, NEGATIVE FOR CARCINOMA (0/1). 3. Lymph node, sentinel, biopsy, Right Axillary #2 - ONE LYMPH NODE, NEGATIVE FOR CARCINOMA (0/1). 4. Lymph node, sentinel, biopsy, Right - ONE LYMPH NODE, NEGATIVE FOR CARCINOMA (0/1). 5. Breast, lumpectomy, Right w/seed - INVASIVE DUCTAL CARCINOMA, 2.6 CM, NOTTINGHAM GRADE 2 OF  3. - MARGINS OF RESECTION ARE NOT INVOLVED (CLOSEST MARGIN: LESS THAN 1 MM, SUPERIOR). - BIOPSY SITE CHANGES. - SEE ONCOLOGY TABLE. 6. Medical device, removal, radioactive seed - RADIOACTIVE SEED. - GROSS ONLY.   11/25/2018 Oncotype testing   Oncotype 11/25/18 Recurrence Score 21 with distant recurrence risk at 9 years of with Tamoxifen alone 7%.  There is less than 1% benefit from chemo.    12/25/2018 Cancer Staging   Staging form: Breast, AJCC 8th Edition - Pathologic stage from 12/25/2018: Stage IA (pT2, pN0, cM0, G2, ER+, PR+, HER2-, Oncotype DX score: 21) - Signed by [name], MD on 02/01/2019   01/11/2019 - 02/08/2019 Radiation Therapy   Adjuvant Radiation with Dr [name] 01/11/19-02/08/19   05/05/2019 Survivorship   SCP delivered by Cira Rue, NP       CURRENT THERAPY:  Anastrozole 71m daily starting end of 10/2018  INTERVAL HISTORY:  NKatherine Mcfarland here for a follow up of right breast cancer. She was last seen by me 8 months ago. She presents to the clinic alone. She notes she had 3 deaths in her family in 2021. She notes she is still dealing with this. She notes she is tolerating anastrozole well. She has some mood change, but overall tolerable. She notes her right breast will occasionally get sore. She also notes left breast burning since prior cysts were removed. She notes occasional LLQ discomfort. She notes she had colonoscopy 08/2018. She notes she has benign fat tissue above her left knee.   She notes she has had her COVID booster.    REVIEW OF SYSTEMS:   Constitutional: Denies fevers, chills or abnormal weight loss Eyes: Denies blurriness of vision Ears, nose, mouth, throat, and face: Denies mucositis or sore throat Respiratory: Denies cough, dyspnea or wheezes Cardiovascular: Denies palpitation, chest discomfort or lower extremity swelling Gastrointestinal:  Denies nausea, heartburn or change in bowel habits Skin: Denies abnormal skin rashes Lymphatics:  Denies new lymphadenopathy or easy bruising Neurological:Denies numbness, tingling or new weaknesses Behavioral/Psych: (+) Occasionally mood swings  Breast: (+) right breast aches   All other systems were reviewed with the patient and are negative.  MEDICAL HISTORY:  Past Medical History:  Diagnosis Date  . Allergy   . Anemia   . Blood transfusion without reported diagnosis   . Breast cancer ([facility] 11/2018   Right breast invasive mammary carcinoma  . Cancer [place]   . Cataract   . Chronic kidney disease   . Diabetes mellitus without complication ([facility]   . Esophageal ulcer with bleeding 05/05/2013   05/05/2013 EGD linear distal esophageal ulcer - cause of hematemesis prior to EGD   . GERD (gastroesophageal reflux disease)   . History of kidney stones   . Hyperlipidemia   . Hypertension   . Kidney cysts   . Personal history of radiation therapy   . Sarcoidosis 1970    SURGICAL HISTORY: Past Surgical History:  Procedure Laterality Date  . ABDOMINAL HYSTERECTOMY    . BREAST EXCISIONAL BIOPSY Left 11/2018   Fibroadenoma  . BREAST LUMPECTOMY    . BREAST LUMPECTOMY WITH RADIOACTIVE SEED AND SENTINEL LYMPH NODE BIOPSY Bilateral 11/25/2018  Procedure: BILATERAL BREAST LUMPECTOMIES  WITH BILATERAL  RADIOACTIVE SEEDS AND RIGHT SENTINEL LYMPH NODE BIOPSY;  Surgeon: [name], MD;  Location: [facility];  Service: General;  Laterality: Bilateral;  . CATARACT EXTRACTION W/ INTRAOCULAR LENS IMPLANT    . COLONOSCOPY    . COLONOSCOPY, ESOPHAGOGASTRODUODENOSCOPY (EGD) AND ESOPHAGEAL DILATION N/A 05/05/2013   Procedure: COLONOSCOPY, ESOPHAGOGASTRODUODENOSCOPY (EGD) AND ESOPHAGEAL DILATION (ED);  Surgeon: Gatha Mayer, MD;  [facility];  Service: Endoscopy;  Laterality: N/A;  . LITHOTRIPSY    . POLYPECTOMY    . UPPER GASTROINTESTINAL ENDOSCOPY      I have reviewed the social history and family history with the patient and they are unchanged from previous note.  ALLERGIES:  is  allergic to codeine and percocet [oxycodone-acetaminophen].  MEDICATIONS:  Current Outpatient Medications  Medication Sig Dispense Refill  . acetaminophen (TYLENOL) 500 MG tablet Take 1,000 mg by mouth every 6 (six) hours as needed for moderate pain or headache.     Marland Kitchen amLODipine (NORVASC) 10 MG tablet TAKE 1 TABLET BY MOUTH EVERY DAY 90 tablet 1  . anastrozole (ARIMIDEX) 1 MG tablet TAKE 1 TABLET(1 MG) BY MOUTH DAILY 30 tablet 3  . Ascorbic Acid (VITAMIN C) 1000 MG tablet Take 1,000 mg by mouth daily.    Marland Kitchen aspirin EC 81 MG tablet Take 81 mg by mouth daily.    Marland Kitchen atorvastatin (LIPITOR) 20 MG tablet Take 1 tablet (20 mg total) by mouth at bedtime. 90 tablet 1  . cholecalciferol (VITAMIN D) 1000 UNITS tablet Take 1,000 Units by mouth daily.     Marland Kitchen glimepiride (AMARYL) 4 MG tablet Take 1 tablet (4 mg total) by mouth every morning. 90 tablet 1  . JANUVIA 50 MG tablet TAKE 1 TABLET(50 MG) BY MOUTH DAILY 90 tablet 1  . Multiple Vitamin (MULTIVITAMIN WITH MINERALS) TABS tablet Take 1 tablet by mouth daily.     Glory Rosebush ULTRA test strip USE AS DIRECTED TO CHECK BLOOD SUGAR TWICE DAILY 100 strip 3  . Polyethyl Glycol-Propyl Glycol (SYSTANE OP) Place 1 drop into both eyes 2 (two) times daily.     . polyethylene glycol (MIRALAX / GLYCOLAX) packet Take 17 g by mouth daily.      No current facility-administered medications for this visit.    PHYSICAL EXAMINATION: ECOG PERFORMANCE STATUS: 0 - Asymptomatic  Vitals:   04/02/20 0916  BP: (!) 166/79  Pulse: 72  Resp: 17  Temp: 97.7 F (36.5 C)  SpO2: 100%   Filed Weights   04/02/20 0916  Weight: 137 lb 1.6 oz (62.2 kg)    GENERAL:alert, no distress and comfortable SKIN: skin color, texture, turgor are normal, no rashes or significant lesions (+) small Benign lipoma above left knee EYES: normal, Conjunctiva are pink and non-injected, sclera clear  NECK: supple, thyroid normal size, non-tender, without nodularity LYMPH:  no palpable  lymphadenopathy in the cervical, axillary  LUNGS: clear to auscultation and percussion with normal breathing effort HEART: regular rate & rhythm and no murmurs and no lower extremity edema ABDOMEN:abdomen soft, non-tender and normal bowel sounds (+) Prior surgical incisions healed well Musculoskeletal:no cyanosis of digits and no clubbing  NEURO: alert & oriented x 3 with fluent speech, no focal motor/sensory deficits BREAST: S/p b/l lumpectomy: Surgical incisions healed well with scar tissue in left breast (+) right breast skin darkening and lymphedema. No palpable mass, nodules or adenopathy bilaterally. Breast exam benign.   LABORATORY DATA:  I have reviewed the data as listed CBC  Latest Ref Rng & Units 04/02/2020 12/20/2019 08/01/2019  WBC 4.0 - 10.5 K/uL 7.6 5.7 4.8  Hemoglobin 12.0 - 15.0 g/dL 12.2 11.9 11.2(L)  Hematocrit 36.0 - 46.0 % 37.4 35.1 35.1(L)  Platelets 150 - 400 K/uL 222 268 233     CMP Latest Ref Rng & Units 04/02/2020 12/20/2019 08/01/2019  Glucose 70 - 99 mg/dL 175(H) 117(H) 232(H)  BUN 8 - 23 mg/dL '10 15 14  ' Creatinine 0.44 - 1.00 mg/dL 0.87 0.98 0.88  Sodium 135 - 145 mmol/L 140 141 139  Potassium 3.5 - 5.1 mmol/L 4.0 4.9 4.2  Chloride 98 - 111 mmol/L 105 100 104  CO2 22 - 32 mmol/L '29 27 27  ' Calcium 8.9 - 10.3 mg/dL 9.4 10.3 8.8(L)  Total Protein 6.5 - 8.1 g/dL 7.1 7.0 6.7  Total Bilirubin 0.3 - 1.2 mg/dL 0.6 0.4 0.5  Alkaline Phos 38 - 126 U/L 71 77 59  AST 15 - 41 U/L '26 29 28  ' ALT 0 - 44 U/L 26 25 33      RADIOGRAPHIC STUDIES: I have personally reviewed the radiological images as listed and agreed with the findings in the report. No results found.   ASSESSMENT & PLAN:  Mayetta Castleman is a 80 y.o. female with    1.Malignant neoplasm of upper-inner quadrant of right breast, StageIA,p(T2N0M0), ER/PR:+, HER2-,G2, RS 21 -She was diagnosed in 09/2018. She is s/p b/l breast lumpectomyon 11/25/18 with Dr. [name]. Her pathology showed complete surgical  resection of tumor, with low risk RS 21. She is s/p adjuvant RT in 10-01/2019.  -She started anastrozole end of 10/2018 before surgery. Tolerating well, will continue.  -She is clinically doing well. Lab reviewed, her CBC and CMP are within normal limits except BG 175. Her physical exam showed right breast lymphedema which causes pain. Her 7/021 Mammogram was benign. There is no clinical concern for recurrence. -Send referral for PT.  -Continue surveillance. Next mammogram in 09/2020.  -Continue Anastrozole.  -F/u with NP Lacie in 6 months    2. DM, HTN -On amlodipine, glimepiride, janumet. Continue to f/u with PCP    3. Bone health  -08/2018 DEXA was normal with lowest T-score 0.9 at left femur neck.  -On calcium and Vitamin D. -Will repeat DEXA in 09/2020   4. Mild anemia  -Her hg was normal in 06/2019 with lab by PCP.  -Her Hg at 11.2 on 08/01/19. Prior labs shows she has had intermittent mild anemia in the past.  -Her 08/2018 colonoscopy with Dr Carlean Purl. Given her age she does not plan to repeat.  -Anemia has resolved recently. Her anemia panel is still pending (04/02/20). Continue multivitamins.    PLAN: -Continue Anastrozole  -Mammogram and DEXA in 09/2020  -Lab and F/u with NP Lacie in 6 months  -Send PT referral for right breast lymphedema   No problem-specific Assessment & Plan notes found for this encounter.   Orders Placed This Encounter  Procedures  . MM DIAG BREAST TOMO BILATERAL    Standing Status:   Future    Standing Expiration Date:   04/02/2021    Order Specific Question:   Reason for Exam (SYMPTOM  OR DIAGNOSIS REQUIRED)    Answer:   screening    Order Specific Question:   Preferred imaging location?    Answer:   [facility]  . DG Bone Density    Standing Status:   Future    Standing Expiration Date:   04/02/2021    Order Specific  Question:   Reason for Exam (SYMPTOM  OR DIAGNOSIS REQUIRED)    Answer:   screening    Order Specific Question:    Preferred imaging location?    Answer:   [facility]  . Ambulatory referral to Physical Therapy    Referral Priority:   Routine    Referral Type:   Physical Medicine    Referral Reason:   Specialty Services Required    Requested Specialty:   Physical Therapy    Number of Visits Requested:   1   All questions were answered. The patient knows to call the clinic with any problems, questions or concerns. No barriers to learning was detected. The total time spent in the appointment was 30 minutes.     [name], MD 04/02/2020   I, Joslyn Devon, am acting as scribe for [name], MD.   I have reviewed the above documentation for accuracy and completeness, and I agree with the above.

## 2020-04-02 ENCOUNTER — Inpatient Hospital Stay: Payer: Medicare Other | Attending: Hematology | Admitting: Hematology

## 2020-04-02 ENCOUNTER — Inpatient Hospital Stay: Payer: Medicare Other

## 2020-04-02 ENCOUNTER — Encounter: Payer: Self-pay | Admitting: Hematology

## 2020-04-02 ENCOUNTER — Other Ambulatory Visit: Payer: Self-pay

## 2020-04-02 ENCOUNTER — Telehealth: Payer: Self-pay | Admitting: Hematology

## 2020-04-02 VITALS — BP 166/79 | HR 72 | Temp 97.7°F | Resp 17 | Ht 61.0 in | Wt 137.1 lb

## 2020-04-02 DIAGNOSIS — E1122 Type 2 diabetes mellitus with diabetic chronic kidney disease: Secondary | ICD-10-CM | POA: Diagnosis not present

## 2020-04-02 DIAGNOSIS — D869 Sarcoidosis, unspecified: Secondary | ICD-10-CM | POA: Insufficient documentation

## 2020-04-02 DIAGNOSIS — Z90710 Acquired absence of both cervix and uterus: Secondary | ICD-10-CM | POA: Diagnosis not present

## 2020-04-02 DIAGNOSIS — Z833 Family history of diabetes mellitus: Secondary | ICD-10-CM | POA: Insufficient documentation

## 2020-04-02 DIAGNOSIS — I890 Lymphedema, not elsewhere classified: Secondary | ICD-10-CM | POA: Insufficient documentation

## 2020-04-02 DIAGNOSIS — Z923 Personal history of irradiation: Secondary | ICD-10-CM | POA: Insufficient documentation

## 2020-04-02 DIAGNOSIS — Z170 Estrogen receptor positive status [ER+]: Secondary | ICD-10-CM

## 2020-04-02 DIAGNOSIS — Z79899 Other long term (current) drug therapy: Secondary | ICD-10-CM | POA: Insufficient documentation

## 2020-04-02 DIAGNOSIS — Z79811 Long term (current) use of aromatase inhibitors: Secondary | ICD-10-CM | POA: Diagnosis not present

## 2020-04-02 DIAGNOSIS — E2839 Other primary ovarian failure: Secondary | ICD-10-CM

## 2020-04-02 DIAGNOSIS — Z7984 Long term (current) use of oral hypoglycemic drugs: Secondary | ICD-10-CM | POA: Diagnosis not present

## 2020-04-02 DIAGNOSIS — D649 Anemia, unspecified: Secondary | ICD-10-CM

## 2020-04-02 DIAGNOSIS — Z842 Family history of other diseases of the genitourinary system: Secondary | ICD-10-CM | POA: Diagnosis not present

## 2020-04-02 DIAGNOSIS — N189 Chronic kidney disease, unspecified: Secondary | ICD-10-CM | POA: Diagnosis not present

## 2020-04-02 DIAGNOSIS — Z8249 Family history of ischemic heart disease and other diseases of the circulatory system: Secondary | ICD-10-CM | POA: Insufficient documentation

## 2020-04-02 DIAGNOSIS — C50211 Malignant neoplasm of upper-inner quadrant of right female breast: Secondary | ICD-10-CM | POA: Diagnosis not present

## 2020-04-02 DIAGNOSIS — I129 Hypertensive chronic kidney disease with stage 1 through stage 4 chronic kidney disease, or unspecified chronic kidney disease: Secondary | ICD-10-CM | POA: Diagnosis not present

## 2020-04-02 LAB — CMP (CANCER CENTER ONLY)
ALT: 26 U/L (ref 0–44)
AST: 26 U/L (ref 15–41)
Albumin: 4 g/dL (ref 3.5–5.0)
Alkaline Phosphatase: 71 U/L (ref 38–126)
Anion gap: 6 (ref 5–15)
BUN: 10 mg/dL (ref 8–23)
CO2: 29 mmol/L (ref 22–32)
Calcium: 9.4 mg/dL (ref 8.9–10.3)
Chloride: 105 mmol/L (ref 98–111)
Creatinine: 0.87 mg/dL (ref 0.44–1.00)
GFR, Estimated: >60 mL/min (ref >60)
Glucose, Bld: 175 mg/dL — ABNORMAL HIGH (ref 70–99)
Potassium: 4 mmol/L (ref 3.5–5.1)
Sodium: 140 mmol/L (ref 135–145)
Total Bilirubin: 0.6 mg/dL (ref 0.3–1.2)
Total Protein: 7.1 g/dL (ref 6.5–8.1)

## 2020-04-02 LAB — IRON AND TIBC
Iron: 65 ug/dL (ref 41–142)
Saturation Ratios: 17 % — ABNORMAL LOW (ref 21–57)
TIBC: 379 ug/dL (ref 236–444)
UIBC: 314 ug/dL (ref 120–384)

## 2020-04-02 LAB — CBC WITH DIFFERENTIAL (CANCER CENTER ONLY)
Abs Immature Granulocytes: 0.03 10*3/uL (ref 0.00–0.07)
Basophils Absolute: 0 10*3/uL (ref 0.0–0.1)
Basophils Relative: 0 %
Eosinophils Absolute: 0 10*3/uL (ref 0.0–0.5)
Eosinophils Relative: 1 %
HCT: 37.4 % (ref 36.0–46.0)
Hemoglobin: 12.2 g/dL (ref 12.0–15.0)
Immature Granulocytes: 0 %
Lymphocytes Relative: 13 %
Lymphs Abs: 1 10*3/uL (ref 0.7–4.0)
MCH: 30 pg (ref 26.0–34.0)
MCHC: 32.6 g/dL (ref 30.0–36.0)
MCV: 92.1 fL (ref 80.0–100.0)
Monocytes Absolute: 0.4 10*3/uL (ref 0.1–1.0)
Monocytes Relative: 5 %
Neutro Abs: 6.2 10*3/uL (ref 1.7–7.7)
Neutrophils Relative %: 81 %
Platelet Count: 222 10*3/uL (ref 150–400)
RBC: 4.06 MIL/uL (ref 3.87–5.11)
RDW: 12 % (ref 11.5–15.5)
WBC Count: 7.6 10*3/uL (ref 4.0–10.5)
nRBC: 0 % (ref 0.0–0.2)

## 2020-04-02 LAB — VITAMIN B12: Vitamin B-12: 2304 pg/mL — ABNORMAL HIGH (ref 180–914)

## 2020-04-02 LAB — FERRITIN: Ferritin: 41 ng/mL (ref 11–307)

## 2020-04-02 NOTE — Telephone Encounter (Signed)
Scheduled appointments per 1/10 los. Spoke to patient who is aware of appointments dates and times.  

## 2020-04-05 ENCOUNTER — Encounter: Payer: Self-pay | Admitting: Physical Therapy

## 2020-04-05 ENCOUNTER — Other Ambulatory Visit: Payer: Self-pay

## 2020-04-05 ENCOUNTER — Ambulatory Visit: Payer: Medicare Other | Attending: Hematology | Admitting: Physical Therapy

## 2020-04-05 DIAGNOSIS — I890 Lymphedema, not elsewhere classified: Secondary | ICD-10-CM | POA: Insufficient documentation

## 2020-04-05 DIAGNOSIS — L599 Disorder of the skin and subcutaneous tissue related to radiation, unspecified: Secondary | ICD-10-CM | POA: Insufficient documentation

## 2020-04-05 NOTE — Therapy (Signed)
[facility] [place], [place], [address] Phone: [phone]   Fax:  [phone]  Physical Therapy Evaluation  Patient Details  Name: Elizabeth Mcfarland MRN: [id number] Date of Birth: 1940-11-30 Referring Provider (PT): Burr Medico   Encounter Date: 04/05/2020   PT End of Session - 04/05/20 1218    Visit Number 1    Number of Visits 9    Date for PT Re-Evaluation 05/03/20    PT Start Time 1108    PT Stop Time 1155    PT Time Calculation (min) 47 min    Activity Tolerance Patient tolerated treatment well    Behavior During Therapy [facility] for tasks assessed/performed           Past Medical History:  Diagnosis Date  . Allergy   . Anemia   . Blood transfusion without reported diagnosis   . Breast cancer ([place]) 11/2018   Right breast invasive mammary carcinoma  . Cancer ([place])   . Cataract   . Chronic kidney disease   . Diabetes mellitus without complication ([place])   . Esophageal ulcer with bleeding 05/05/2013   05/05/2013 EGD linear distal esophageal ulcer - cause of hematemesis prior to EGD   . GERD (gastroesophageal reflux disease)   . History of kidney stones   . Hyperlipidemia   . Hypertension   . Kidney cysts   . Personal history of radiation therapy   . Sarcoidosis 1970    Past Surgical History:  Procedure Laterality Date  . ABDOMINAL HYSTERECTOMY    . BREAST EXCISIONAL BIOPSY Left 11/2018   Fibroadenoma  . BREAST LUMPECTOMY    . BREAST LUMPECTOMY WITH RADIOACTIVE SEED AND SENTINEL LYMPH NODE BIOPSY Bilateral 11/25/2018   Procedure: BILATERAL BREAST LUMPECTOMIES  WITH BILATERAL  RADIOACTIVE SEEDS AND RIGHT SENTINEL LYMPH NODE BIOPSY;  Surgeon: [name], MD;  Location: [facility];  Service: General;  Laterality: Bilateral;  . CATARACT EXTRACTION W/ INTRAOCULAR LENS IMPLANT    . COLONOSCOPY    . COLONOSCOPY, ESOPHAGOGASTRODUODENOSCOPY (EGD) AND ESOPHAGEAL DILATION N/A 05/05/2013   Procedure: COLONOSCOPY,  ESOPHAGOGASTRODUODENOSCOPY (EGD) AND ESOPHAGEAL DILATION (ED);  Surgeon: Gatha Mayer, MD;  [facility];  Service: Endoscopy;  Laterality: N/A;  . LITHOTRIPSY    . POLYPECTOMY    . UPPER GASTROINTESTINAL ENDOSCOPY      There were no vitals filed for this visit.    Subjective Assessment - 04/05/20 1009    Subjective I started noticing the swelling over the year ago but I thought it would go away and it did not. I am not having any pain.    Pertinent History R breast cancer- bilateral lumpectomy (L not cancerous) on 12/05/18 with SLNB on R, completed radiation, diabetes, kidney stones    Patient Stated Goals to get the swelling down    Currently in Pain? No/denies    Pain Score 0-No pain              [facility] PT Assessment - 04/05/20 0001      Assessment   Medical Diagnosis R breast cancer    Referring Provider (PT) Burr Medico    Onset Date/Surgical Date 12/05/18    Hand Dominance Right    Prior Therapy none      Precautions   Precautions Other (comment)    Precaution Comments lymphedema      Restrictions   Weight Bearing Restrictions No      Balance Screen   Has the patient fallen in the past 6  months No    Has the patient had a decrease in activity level because of a fear of falling?  No    Is the patient reluctant to leave their home because of a fear of falling?  No      Home Social worker Private residence    Living Arrangements Alone    Available Help at Discharge Friend(s)    Type of [place] to enter    Entrance Stairs-Number of Steps [place] One level      Prior Function   Level of [place] Retired    Leisure likes to garden, rides a stationary bike occasionally, Printmaker   Overall Cognitive Status Within Functional Limits for tasks assessed      Observation/Other Assessments   Observations Pt's R breast is approximately 25-30% larger than her L  breast with increased fibrosis just superior to nipple and at inferior breast      Posture/Postural Control   Posture/Postural Control Postural limitations    Postural Limitations Rounded Shoulders;Forward head      ROM / Strength   AROM / PROM / Strength AROM      AROM   AROM Assessment Site Shoulder    Right/Left Shoulder Left;Right    Right Shoulder Flexion 151 Degrees    Right Shoulder ABduction 161 Degrees    Left Shoulder Flexion 155 Degrees    Left Shoulder ABduction 156 Degrees             LYMPHEDEMA/ONCOLOGY QUESTIONNAIRE - 04/05/20 0001      Type   Cancer Type right breast cancer      Surgeries   Lumpectomy Date 12/05/18    Sentinel Lymph Node Biopsy Date 12/05/18    Number Lymph Nodes Removed 3   all negative     Date Lymphedema/Swelling Started   Date 08/23/19      Treatment   Active Chemotherapy Treatment No    Past Chemotherapy Treatment No    Active Radiation Treatment No    Past Radiation Treatment Yes    Current Hormone Treatment Yes    Drug Name anastrozole      What other symptoms do you have   Are you Having Heaviness or Tightness Yes    Are you having Pain No    Is it Hard or Difficult finding clothes that fit No    Do you have infections No      Lymphedema Assessments   Lymphedema Assessments Upper extremities      Right Upper Extremity Lymphedema   15 cm Proximal to Olecranon Process 24.2 cm    Olecranon Process 22.2 cm    15 cm Proximal to Ulnar Styloid Process 20.6 cm    Just Proximal to Ulnar Styloid Process 15 cm    Across Hand at [employer] 20 cm    At [place] of 2nd Digit 6.6 cm      Left Upper Extremity Lymphedema   15 cm Proximal to Olecranon Process 24 cm    Olecranon Process 23.5 cm    15 cm Proximal to Ulnar Styloid Process 20.6 cm    Just Proximal to Ulnar Styloid Process 15.2 cm    Across Hand at [employer] 19.5 cm    At [place] of 2nd Digit 6.5 cm  Objective measurements completed  on examination: See above findings.       [place] Adult PT Treatment/Exercise - 04/05/20 0001      Manual Therapy   Manual Therapy Edema management    Edema Management created foam chip pack for pt to wear in her bra                  PT Education - 04/05/20 1222    Education Details anatomy and physiology of lymphatic system, lymphedema risk reduction practices, treatment for breast lymphedema    Person(s) Educated Patient    Methods Explanation;Handout    Comprehension Verbalized understanding               PT Long Term Goals - 04/05/20 1102      PT LONG TERM GOAL #1   Title Pt will be independent in self MLD for long term management of lymphedema.    Time 4    Period Weeks    Status New    Target Date 05/03/20      PT LONG TERM GOAL #2   Title Pt will obtain a compression bra for long term management of lymphedema.    Time 4    Period Weeks    Status New    Target Date 05/03/20      PT LONG TERM GOAL #3   Title Pt will demonstrate a 50% reduction in fibrosis in breast to decrease risk of infection.    Time 4    Period Weeks    Status New    Target Date 05/03/20      PT LONG TERM GOAL #4   Title Pt will report a 50% improvement in scar tissue at bilateral breast lumpectomy scars to improve skin mobility.    Time 4    Period Weeks    Status New    Target Date 05/03/20                  Plan - 04/05/20 1219    Clinical Impression Statement Pt reports to PT with right breast lymphedema following a R breast lumpectomy and SLNB on 12/05/18. She reports she began developing swelling in her R breast around June 2021. She reports it began after working in the garden more frequently. She has increased fibrosis superior to her R nipple and and in her inferior breast. Pt has decreased scar mobility. She would benefit from skilled PT services to decrease R breast lymphedema, assist pt with obtaining appropriate compression garments and teach pt self MLD for  long term management.    Personal Factors and Comorbidities Time since onset of injury/illness/exacerbation    Stability/Clinical Decision Making Stable/Uncomplicated    Clinical Decision Making Low    Rehab Potential Good    PT Frequency 2x / week    PT Duration 4 weeks    PT Treatment/Interventions ADLs/Self Care Home Management;Therapeutic exercise;Patient/family education;Manual techniques;Manual lymph drainage;Compression bandaging;Scar mobilization;Taping;Vasopneumatic Device    PT Next Visit Plan begin MLD to R breast, check for signed Rx back for compression bra, eventually instruct in self MLD    PT Home Exercise Plan wear foam chip pack in bra    Consulted and Agree with Plan of Care Patient           Patient will benefit from skilled therapeutic intervention in order to improve the following deficits and impairments:  Postural dysfunction,Decreased knowledge of precautions,Increased edema,Decreased scar mobility  Visit Diagnosis: Lymphedema, not elsewhere classified  Disorder of the  skin and subcutaneous tissue related to radiation, unspecified     Problem List Patient Active Problem List   Diagnosis Date Noted  . Malignant neoplasm of upper-inner quadrant of right breast in female, estrogen receptor positive ([place]) 10/11/2018  . Localized skin eruption 10/07/2018  . Breast mass seen on mammogram 10/07/2018  . Gastroesophageal reflux disease without esophagitis 10/07/2018  . Chest pain 12/23/2015  . Essential hypertension   . Diabetes mellitus type 2 in nonobese ([facility])   . Anemia 06/21/2013  . Personal history of colonic polyps 05/04/2013  . Type II diabetes mellitus with manifestations, uncontrolled ([place]) 02/21/2008  . HYPERTENSION 02/21/2008  . IBS 02/21/2008  . NEPHROLITHIASIS, HX OF 02/21/2008    Allyson Sabal [facility] 04/05/2020, 12:24 PM  [place], [place], [address] Phone:  [phone]   Fax:  [phone]  Name: Elizabeth Mcfarland MRN: [id number] Date of Birth: [birth year]  Allyson Sabal [place], PT 04/05/20 12:24 PM

## 2020-04-12 ENCOUNTER — Ambulatory Visit: Payer: Medicare Other

## 2020-04-12 ENCOUNTER — Other Ambulatory Visit: Payer: Self-pay

## 2020-04-12 DIAGNOSIS — L599 Disorder of the skin and subcutaneous tissue related to radiation, unspecified: Secondary | ICD-10-CM

## 2020-04-12 DIAGNOSIS — I890 Lymphedema, not elsewhere classified: Secondary | ICD-10-CM

## 2020-04-12 NOTE — Therapy (Signed)
[place], [place], [address] Phone: [phone]   Fax:  [phone]  Physical Therapy Treatment  Patient Details  Name: Elizabeth Mcfarland MRN: [id number] Date of Birth: [birth year] Referring Provider (PT): Burr Medico   Encounter Date: 04/12/2020   PT End of Session - 04/12/20 1219    Visit Number 2    Number of Visits 9    Date for PT Re-Evaluation 05/03/20    PT Start Time 1106    PT Stop Time 1208    PT Time Calculation (min) 62 min    Activity Tolerance Patient tolerated treatment well    Behavior During Therapy [facility] for tasks assessed/performed           Past Medical History:  Diagnosis Date  . Allergy   . Anemia   . Blood transfusion without reported diagnosis   . Breast cancer ([place]) 11/2018   Right breast invasive mammary carcinoma  . Cancer ([place])   . Cataract   . Chronic kidney disease   . Diabetes mellitus without complication ([place])   . Esophageal ulcer with bleeding 05/05/2013   05/05/2013 EGD linear distal esophageal ulcer - cause of hematemesis prior to EGD   . GERD (gastroesophageal reflux disease)   . History of kidney stones   . Hyperlipidemia   . Hypertension   . Kidney cysts   . Personal history of radiation therapy   . Sarcoidosis 1970    Past Surgical History:  Procedure Laterality Date  . ABDOMINAL HYSTERECTOMY    . BREAST EXCISIONAL BIOPSY Left 11/2018   Fibroadenoma  . BREAST LUMPECTOMY    . BREAST LUMPECTOMY WITH RADIOACTIVE SEED AND SENTINEL LYMPH NODE BIOPSY Bilateral 11/25/2018   Procedure: BILATERAL BREAST LUMPECTOMIES  WITH BILATERAL  RADIOACTIVE SEEDS AND RIGHT SENTINEL LYMPH NODE BIOPSY;  Surgeon: [name], MD;  Location: [place];  Service: General;  Laterality: Bilateral;  . CATARACT EXTRACTION W/ INTRAOCULAR LENS IMPLANT    . COLONOSCOPY    . COLONOSCOPY, ESOPHAGOGASTRODUODENOSCOPY (EGD) AND ESOPHAGEAL DILATION N/A 05/05/2013   Procedure: COLONOSCOPY,  ESOPHAGOGASTRODUODENOSCOPY (EGD) AND ESOPHAGEAL DILATION (ED);  Surgeon: Gatha Mayer, MD;  Location: [facility];  Service: Endoscopy;  Laterality: N/A;  . LITHOTRIPSY    . POLYPECTOMY    . UPPER GASTROINTESTINAL ENDOSCOPY      There were no vitals filed for this visit.   Subjective Assessment - 04/12/20 1112    Subjective I've been wearing the foam in my bra and that has really helped to soften my bra. I even slept in my bra last night and that made a big difference with my morning swelling.    Pertinent History R breast cancer- bilateral lumpectomy (L not cancerous) on 12/05/18 with SLNB on R, completed radiation, diabetes, kidney stones    Patient Stated Goals to get the swelling down    Currently in Pain? No/denies                             [facility] Adult PT Treatment/Exercise - 04/12/20 0001      Manual Therapy   Manual Therapy Myofascial release;Manual Lymphatic Drainage (MLD);Passive ROM    Myofascial Release To Rt axilla during P/ROM of shoulder    Manual Lymphatic Drainage (MLD) In Supine with HOB elevated: Short neck, superficial and deep abdominals, Rt inguinal and Lt axillary nodes, Rt axillo-inguinal and anterior inter-axillary anastomosis, then focused on superior Rt breast redirecting towards inter-axillary  anastomosis, then into Lt S/L for focus to lateral and inferior breast redirecting towards axillo-inguinal anastomosis, then finished in supine retracing all steps. Began instructing pt in this while performing today having her return demo    Passive ROM In Supine to Rt shoulder into flexion, abduction and D2 to pts available end motions                  PT Education - 04/12/20 1215    Education Details Self manual lymph drainage of Rt breast    Person(s) Educated Patient    Methods Explanation;Demonstration;Handout    Comprehension Verbalized understanding;Returned demonstration;Tactile cues required;Need further instruction;Verbal cues  required               PT Long Term Goals - 04/05/20 1102      PT LONG TERM GOAL #1   Title Pt will be independent in self MLD for long term management of lymphedema.    Time 4    Period Weeks    Status New    Target Date 05/03/20      PT LONG TERM GOAL #2   Title Pt will obtain a compression bra for long term management of lymphedema.    Time 4    Period Weeks    Status New    Target Date 05/03/20      PT LONG TERM GOAL #3   Title Pt will demonstrate a 50% reduction in fibrosis in breast to decrease risk of infection.    Time 4    Period Weeks    Status New    Target Date 05/03/20      PT LONG TERM GOAL #4   Title Pt will report a 50% improvement in scar tissue at bilateral breast lumpectomy scars to improve skin mobility.    Time 4    Period Weeks    Status New    Target Date 05/03/20                 Plan - 04/12/20 1219    Clinical Impression Statement First session of treatment today for Rt breast lymphedema. Began manual lymph drainage and educated pt about basics of anatomy of lymphatic system and principles of MLD throughout session and while performing. Had her return demo of anastomosis and breast sequence then issued handout at end of session. She required multiple VCs for lighter pressue with hand over hand demonstration, also for lighter pressure. Included end P/ROM strtching as well as pt is limited in her end motions and reported feeling good stretch with these motions. She will benefit from further istruction and review of MLD techniques and continued end P/ROM stretching. Issued script with info to call [facility] for a compression bra.    Personal Factors and Comorbidities Time since onset of injury/illness/exacerbation    Stability/Clinical Decision Making Stable/Uncomplicated    Rehab Potential Good    PT Frequency 2x / week    PT Duration 4 weeks    PT Treatment/Interventions ADLs/Self Care Home Management;Therapeutic  exercise;Patient/family education;Manual techniques;Manual lymph drainage;Compression bandaging;Scar mobilization;Taping;Vasopneumatic Device    PT Next Visit Plan Cont MLD to R breast and cont to instruct pt in same, did pt get compression bra?, cont end P/ROM and MFR for Rt shoulder/axilla    PT Home Exercise Plan wear foam chip pack in bra; begin trying self MLD    Consulted and Agree with Plan of Care Patient           Patient will  benefit from skilled therapeutic intervention in order to improve the following deficits and impairments:  Postural dysfunction,Decreased knowledge of precautions,Increased edema,Decreased scar mobility  Visit Diagnosis: Lymphedema, not elsewhere classified  Disorder of the skin and subcutaneous tissue related to radiation, unspecified     Problem List Patient Active Problem List   Diagnosis Date Noted  . Malignant neoplasm of upper-inner quadrant of right breast in female, estrogen receptor positive (HCC) 10/11/2018  . Localized skin eruption 10/07/2018  . Breast mass seen on mammogram 10/07/2018  . Gastroesophageal reflux disease without esophagitis 10/07/2018  . Chest pain 12/23/2015  . Essential hypertension   . Diabetes mellitus type 2 in nonobese (HCC)   . Anemia 06/21/2013  . Personal history of colonic polyps 05/04/2013  . Type II diabetes mellitus with manifestations, uncontrolled (HCC) 02/21/2008  . HYPERTENSION 02/21/2008  . IBS 02/21/2008  . NEPHROLITHIASIS, HX OF 02/21/2008    Hermenia Bers, PTA 04/12/2020, 12:25 PM  [facility] [address] [place], [place], [address] Phone: [phone]   Fax:  [phone]  Name: Elizabeth Mcfarland MRN: [id number] Date of Birth: 10-Sep-1940

## 2020-04-12 NOTE — Patient Instructions (Signed)
Self manual lymph drainage: Perform this sequence once a day.  Only give enough pressure to your skin to make the skin move.  Hug yourself.  Do circles at your neck just above your collarbones.  Repeat this 10 times.  Diaphragmatic - Supine   Inhale through nose making navel move out toward hands. Exhale through puckered lips, hands follow navel in. Repeat _5__ times. Rest _10__ seconds between repeats.   Axilla - One at a Time   Using full weight of flat hand and fingers at center of uninvolved armpit, make _10__ in-place circles.   LEG: Inguinal Nodes Stimulation   With small finger side of hand against hip crease on involved side, gently perform circles at the crease. Repeat __10_ times.   1) Axilla to Inguinal Nodes - Sweep   On involved side, pump _4__ times from armpit along side of trunk to hip crease.  Now gently stretch skin from the involved side to the uninvolved side across the chest at the shoulder line.  Repeat that 4 times.  Draw an imaginary diagonal line from upper outer breast through the nipple area toward lower inner breast.  Direct fluid upward and inward from this line toward the pathway across your upper chest . Do this in three rows to treat all of the upper inner breast tissue, and do each row 3-4x.      Direct fluid to treat all of lower outer breast tissue in left sidelaying downward and outward toward  pathway that is aimed at the right groin.  Finish by doing the pathways as described above going from your involved armpit to the same side groin and going across your upper chest from the involved shoulder to the uninvolved shoulder.  Repeat the steps above where you do circles in your right groin and left armpit.

## 2020-04-16 ENCOUNTER — Other Ambulatory Visit: Payer: Self-pay

## 2020-04-16 ENCOUNTER — Ambulatory Visit: Payer: Medicare Other

## 2020-04-16 DIAGNOSIS — I890 Lymphedema, not elsewhere classified: Secondary | ICD-10-CM

## 2020-04-16 DIAGNOSIS — L599 Disorder of the skin and subcutaneous tissue related to radiation, unspecified: Secondary | ICD-10-CM | POA: Diagnosis not present

## 2020-04-16 NOTE — Therapy (Signed)
Elizabeth [place]far[place], [place], [address] Phone: [phone]   Fax:  [phone]  Physical Therapy Treatment  Patient Details  Name: Elizabeth Mcfarland MRN: [id number] Date of Birth: 01/24/1941 Referring Provider (PT): Burr Medico   Encounter Date: 04/16/2020   PT End of Session - 04/16/20 1409    Visit Number 3    Number of Visits 9    Date for PT Re-Evaluation 05/03/20    PT Start Time 1301    PT Stop Time 1404    PT Time Calculation (min) 63 min    Activity Tolerance Patient tolerated treatment well    Behavior During Therapy [facility] for tasks assessed/performed           Past Medical History:  Diagnosis Date  . Allergy   . Anemia   . Blood transfusion without reported diagnosis   . Breast cancer ([place]) 11/2018   Right breast invasive mammary carcinoma  . Cancer ([place])   . Cataract   . Chronic kidney disease   . Diabetes mellitus without complication ([place])   . Esophageal ulcer with bleeding 05/05/2013   05/05/2013 EGD linear distal esophageal ulcer - cause of hematemesis prior to EGD   . GERD (gastroesophageal reflux disease)   . History of kidney stones   . Hyperlipidemia   . Hypertension   . Kidney cysts   . Personal history of radiation therapy   . Sarcoidosis 1970    Past Surgical History:  Procedure Laterality Date  . ABDOMINAL HYSTERECTOMY    . BREAST EXCISIONAL BIOPSY Left 11/2018   Fibroadenoma  . BREAST LUMPECTOMY    . BREAST LUMPECTOMY WITH RADIOACTIVE SEED AND SENTINEL LYMPH NODE BIOPSY Bilateral 11/25/2018   Procedure: BILATERAL BREAST LUMPECTOMIES  WITH BILATERAL  RADIOACTIVE SEEDS AND RIGHT SENTINEL LYMPH NODE BIOPSY;  Surgeon: [name], MD;  Location: [place];  Service: General;  Laterality: Bilateral;  . CATARACT EXTRACTION W/ INTRAOCULAR LENS IMPLANT    . COLONOSCOPY    . COLONOSCOPY, ESOPHAGOGASTRODUODENOSCOPY (EGD) AND ESOPHAGEAL DILATION N/A 05/05/2013   Procedure: COLONOSCOPY,  ESOPHAGOGASTRODUODENOSCOPY (EGD) AND ESOPHAGEAL DILATION (ED);  Surgeon: Gatha Mayer, MD;  [facility];  Service: Endoscopy;  Laterality: N/A;  . LITHOTRIPSY    . POLYPECTOMY    . UPPER GASTROINTESTINAL ENDOSCOPY      There were no vitals filed for this visit.   Subjective Assessment - 04/16/20 1307    Subjective I've done the self manual lymph drainage a few times since you showed me last time I was here. I think it's helping some, my breast is a little softer. And I got an appointment to be measured for a compression bra Friday at [facility].    Pertinent History R breast cancer- bilateral lumpectomy (L not cancerous) on 12/05/18 with SLNB on R, completed radiation, diabetes, kidney stones    Patient Stated Goals to get the swelling down    Currently in Pain? No/denies                             [facility] Adult PT Treatment/Exercise - 04/16/20 0001      Manual Therapy   Manual Therapy Myofascial release;Manual Lymphatic Drainage (MLD);Passive ROM    Myofascial Release To Rt axilla during P/ROM of shoulder    Manual Lymphatic Drainage (MLD) In Supine with HOB elevated: Short neck, superficial and deep abdominals, Rt inguinal and Lt axillary nodes, Rt axillo-inguinal and  anterior inter-axillary anastomosis, then focused on superior Rt breast redirecting towards inter-axillary anastomosis, then into Lt S/L for focus to lateral and inferior breast redirecting towards axillo-inguinal anastomosis, then finished in supine retracing all steps reviewing with pt and having her return final demo of each step at end of session    Passive ROM In Supine to Rt shoulder into flexion, abduction and D2 to pts available end motions and scapular depression to prevent compensation techniques                       PT Long Term Goals - 04/05/20 1102      PT LONG TERM GOAL #1   Title Pt will be independent in self MLD for long term management of lymphedema.     Time 4    Period Weeks    Status New    Target Date 05/03/20      PT LONG TERM GOAL #2   Title Pt will obtain a compression bra for long term management of lymphedema.    Time 4    Period Weeks    Status New    Target Date 05/03/20      PT LONG TERM GOAL #3   Title Pt will demonstrate a 50% reduction in fibrosis in breast to decrease risk of infection.    Time 4    Period Weeks    Status New    Target Date 05/03/20      PT LONG TERM GOAL #4   Title Pt will report a 50% improvement in scar tissue at bilateral breast lumpectomy scars to improve skin mobility.    Time 4    Period Weeks    Status New    Target Date 05/03/20                 Plan - 04/16/20 1409    Clinical Impression Statement Continued with focus on manual lymph drainage to Rt breast and reviewed this with pt at end of sequence having her return demo. She was able to return better demo after min tactile and VCs intially, then able to use correct light pressure and skin stretch only with no slide. She will benefit from continued review of this to finalize technique. Good softening of lateral aspect of breast noted when pt in Lt S/L today, also less fibrosis at incision at areola. Also incorporated some end P/ROM stretching to Lt shoulder and MFR to Lt axilla during this as pt continues to demonstrate tightness here.    Personal Factors and Comorbidities Time since onset of injury/illness/exacerbation    Stability/Clinical Decision Making Stable/Uncomplicated    Rehab Potential Good    PT Frequency 2x / week    PT Duration 4 weeks    PT Treatment/Interventions ADLs/Self Care Home Management;Therapeutic exercise;Patient/family education;Manual techniques;Manual lymph drainage;Compression bandaging;Scar mobilization;Taping;Vasopneumatic Device    PT Next Visit Plan Cont MLD to R breast and cont to instruct pt in same, assess fit of compression bra when pt brings next week (gets measured Friday), progress HEP to  include AA/ROM for Rt shoulder and cont end P/ROM and MFR for Rt shoulder/axilla    PT Home Exercise Plan wear foam chip pack in bra; self MLD 1-2x/daily    Consulted and Agree with Plan of Care Patient           Patient will benefit from skilled therapeutic intervention in order to improve the following deficits and impairments:  Postural dysfunction,Decreased knowledge of precautions,Increased edema,Decreased  scar mobility  Visit Diagnosis: Lymphedema, not elsewhere classified  Disorder of the skin and subcutaneous tissue related to radiation, unspecified     Problem List Patient Active Problem List   Diagnosis Date Noted  . Malignant neoplasm of upper-inner quadrant of right breast in female, estrogen receptor positive ([place]) 10/11/2018  . Localized skin eruption 10/07/2018  . Breast mass seen on mammogram 10/07/2018  . Gastroesophageal reflux disease without esophagitis 10/07/2018  . Chest pain 12/23/2015  . Essential hypertension   . Diabetes mellitus type 2 in nonobese ([facility])   . Anemia 06/21/2013  . Personal history of colonic polyps 05/04/2013  . Type II diabetes mellitus with manifestations, uncontrolled ([place]) 02/21/2008  . HYPERTENSION 02/21/2008  . IBS 02/21/2008  . NEPHROLITHIASIS, HX OF 02/21/2008    Otelia Limes, PTA 04/16/2020, 2:25 PM  [place], [place], [address] Phone: [phone]   Fax:  [phone]  Name: Elizabeth Mcfarland MRN: [id number] Date of Birth: 1940/04/14

## 2020-04-18 ENCOUNTER — Other Ambulatory Visit: Payer: Self-pay

## 2020-04-18 ENCOUNTER — Encounter: Payer: Self-pay | Admitting: Nurse Practitioner

## 2020-04-18 ENCOUNTER — Other Ambulatory Visit: Payer: Self-pay | Admitting: Nurse Practitioner

## 2020-04-18 ENCOUNTER — Ambulatory Visit (INDEPENDENT_AMBULATORY_CARE_PROVIDER_SITE_OTHER): Payer: Medicare Other | Admitting: Nurse Practitioner

## 2020-04-18 VITALS — BP 138/84 | HR 77 | Temp 98.3°F | Ht 63.4 in | Wt 135.0 lb

## 2020-04-18 DIAGNOSIS — I129 Hypertensive chronic kidney disease with stage 1 through stage 4 chronic kidney disease, or unspecified chronic kidney disease: Secondary | ICD-10-CM | POA: Diagnosis not present

## 2020-04-18 DIAGNOSIS — N182 Chronic kidney disease, stage 2 (mild): Secondary | ICD-10-CM

## 2020-04-18 DIAGNOSIS — E119 Type 2 diabetes mellitus without complications: Secondary | ICD-10-CM | POA: Diagnosis not present

## 2020-04-18 DIAGNOSIS — E782 Mixed hyperlipidemia: Secondary | ICD-10-CM

## 2020-04-18 NOTE — Progress Notes (Signed)
Rutherford Nail as a scribe for [name], FNP.,have documented all relevant documentation on the behalf of [name], FNP,as directed by  [name], FNP while in the presence of [name], [place]. This visit occurred during the SARS-CoV-2 public health emergency.  Safety protocols were in place, including screening questions prior to the visit, additional usage of staff PPE, and extensive cleaning of exam room while observing appropriate contact time as indicated for disinfecting solutions.  Subjective:     Patient ID: Elizabeth Mcfarland , female    DOB: November 14, 1940 , 80 y.o.   MRN: [id number]   Chief Complaint  Patient presents with  . Diabetes    HPI  Patient is here for dm check. States " I been doing alright". Her son in law passed during Christmas and lost another family member.  She had labs done with Dr. Burr Medico. She is going to therapy for lymphedema to her right breast.       Diabetes She presents for her follow-up diabetic visit. She has type 2 diabetes mellitus. Her disease course has been stable. There are no hypoglycemic associated symptoms. Pertinent negatives for hypoglycemia include no dizziness or headaches. Pertinent negatives for diabetes include no chest pain, no fatigue, no polydipsia, no polyphagia and no polyuria. There are no hypoglycemic complications. Symptoms are stable. There are no diabetic complications. Risk factors for coronary artery disease include hypertension, sedentary lifestyle and dyslipidemia. Current diabetic treatment includes oral agent (dual therapy). She is compliant with treatment all of the time. Her weight is stable. She is following a generally healthy diet. When asked about meal planning, she reported none. She participates in exercise daily (gardening). (Blood sugars are ranging ) An ACE inhibitor/angiotensin II receptor blocker is being taken. Eye exam is current.  Hypertension This is a chronic problem. The current episode  started more than 1 year ago. The problem is unchanged. The problem is controlled. Pertinent negatives include no chest pain, headaches or palpitations. There are no associated agents to hypertension. Risk factors for coronary artery disease include post-menopausal state. Past treatments include ACE inhibitors. The current treatment provides significant improvement. There are no compliance problems.      Past Medical History:  Diagnosis Date  . Allergy   . Anemia   . Blood transfusion without reported diagnosis   . Breast cancer ([place]) 11/2018   Right breast invasive mammary carcinoma  . Cancer ([place])   . Cataract   . Chronic kidney disease   . Diabetes mellitus without complication ([place])   . Esophageal ulcer with bleeding 05/05/2013   05/05/2013 EGD linear distal esophageal ulcer - cause of hematemesis prior to EGD   . GERD (gastroesophageal reflux disease)   . History of kidney stones   . Hyperlipidemia   . Hypertension   . Kidney cysts   . Personal history of radiation therapy   . Sarcoidosis 1970     Family History  Problem Relation Age of Onset  . Diabetes Sister   . Kidney failure Sister   . Colon polyps Sister   . CAD Maternal Grandmother   . Early death Mother   . Stroke Father   . Stroke Brother   . Colon polyps Brother   . Cancer Brother 91       prostate cancer  . Heart disease Brother   . Emphysema Brother   . Cancer Brother 24       prostate cancer  . Asthma Brother   . Breast cancer Neg  Hx   . Colon cancer Neg Hx   . Esophageal cancer Neg Hx   . Rectal cancer Neg Hx   . Stomach cancer Neg Hx      Current Outpatient Medications:  .  acetaminophen (TYLENOL) 500 MG tablet, Take 1,000 mg by mouth every 6 (six) hours as needed for moderate pain or headache. , Disp: , Rfl:  .  amLODipine (NORVASC) 10 MG tablet, TAKE 1 TABLET BY MOUTH EVERY DAY, Disp: 90 tablet, Rfl: 1 .  anastrozole (ARIMIDEX) 1 MG tablet, TAKE 1 TABLET(1 MG) BY MOUTH DAILY, Disp: 30 tablet,  Rfl: 3 .  Ascorbic Acid (VITAMIN C) 1000 MG tablet, Take 1,000 mg by mouth daily., Disp: , Rfl:  .  aspirin EC 81 MG tablet, Take 81 mg by mouth daily., Disp: , Rfl:  .  atorvastatin (LIPITOR) 20 MG tablet, Take 1 tablet (20 mg total) by mouth at bedtime., Disp: 90 tablet, Rfl: 1 .  cholecalciferol (VITAMIN D) 1000 UNITS tablet, Take 1,000 Units by mouth daily. , Disp: , Rfl:  .  glimepiride (AMARYL) 4 MG tablet, Take 1 tablet (4 mg total) by mouth every morning., Disp: 90 tablet, Rfl: 1 .  JANUVIA 50 MG tablet, TAKE 1 TABLET(50 MG) BY MOUTH DAILY, Disp: 90 tablet, Rfl: 1 .  Multiple Vitamin (MULTIVITAMIN WITH MINERALS) TABS tablet, Take 1 tablet by mouth daily. , Disp: , Rfl:  .  ONETOUCH ULTRA test strip, USE AS DIRECTED TO CHECK BLOOD SUGAR TWICE DAILY, Disp: 100 strip, Rfl: 3 .  Polyethyl Glycol-Propyl Glycol (SYSTANE OP), Place 1 drop into both eyes 2 (two) times daily., Disp: , Rfl:  .  polyethylene glycol (MIRALAX / GLYCOLAX) packet, Take 17 g by mouth daily. , Disp: , Rfl:    Allergies  Allergen Reactions  . Codeine Nausea And Vomiting  . Percocet [Oxycodone-Acetaminophen] Nausea And Vomiting     Review of Systems  Constitutional: Negative for fatigue.  HENT: Negative.   Respiratory: Negative.   Cardiovascular: Negative.  Negative for chest pain, palpitations and leg swelling.  Endocrine: Negative for polydipsia, polyphagia and polyuria.  Musculoskeletal: Negative.   Skin: Negative.   Neurological: Negative for dizziness and headaches.  Psychiatric/Behavioral: Negative.      Today's Vitals   04/18/20 1026  BP: 138/84  Pulse: 77  Temp: 98.3 F (36.8 C)  TempSrc: Oral  SpO2: 97%  Weight: 135 lb (61.2 kg)  Height: 5' 3.4" (1.61 m)   Body mass index is 23.61 kg/m.  Wt Readings from Last 3 Encounters:  04/18/20 135 lb (61.2 kg)  04/02/20 137 lb 1.6 oz (62.2 kg)  01/17/20 133 lb 9.6 oz (60.6 kg)   Objective:  Physical Exam Vitals reviewed.  Constitutional:       General: She is not in acute distress.    Appearance: Normal appearance. She is obese.  Cardiovascular:     Rate and Rhythm: Normal rate and regular rhythm.     Pulses: Normal pulses.     Heart sounds: Normal heart sounds. No murmur heard.   Pulmonary:     Effort: Pulmonary effort is normal. No respiratory distress.     Breath sounds: Normal breath sounds. No wheezing.  Abdominal:     General: Abdomen is flat. Bowel sounds are normal.     Palpations: Abdomen is soft.     Tenderness: There is no abdominal tenderness. There is no right CVA tenderness or left CVA tenderness.  Skin:    Capillary Refill: Capillary refill takes  less than 2 seconds.  Neurological:     General: No focal deficit present.     Mental Status: She is alert and oriented to person, place, and time.     Cranial Nerves: No cranial nerve deficit.     Motor: No weakness.  Psychiatric:        Mood and Affect: Mood normal.        Behavior: Behavior normal.        Thought Content: Thought content normal.        Judgment: Judgment normal.         Assessment And Plan:     1. Diabetes mellitus type 2 in nonobese (HCC)  Chronic, stable  Continue with current regimen  Encouraged to continue to avoid sugary foods and drinks - Hemoglobin A1c  2. Benign hypertension with CKD (chronic kidney disease), stage II . B/P is fairly controlled.  . Kidney functions done at cancer center earlier this month . The importance of regular exercise and dietary modification was stressed to the patient.  . Stressed importance of losing ten percent of her body weight to help with B/P control.      Patient was given opportunity to ask questions. Patient verbalized understanding of the plan and was able to repeat key elements of the plan. All questions were answered to their satisfaction.  [name], FNP   I, [name], FNP, have reviewed all documentation for this visit. The documentation on 05/13/20 for the exam,  diagnosis, procedures, and orders are all accurate and complete.  THE PATIENT IS ENCOURAGED TO PRACTICE SOCIAL DISTANCING DUE TO THE COVID-19 PANDEMIC.

## 2020-04-18 NOTE — Patient Instructions (Addendum)
Hypertension, Adult Hypertension is another name for high blood pressure. High blood pressure forces your heart to work harder to pump blood. This can cause problems over time. There are two numbers in a blood pressure reading. There is a top number (systolic) over a bottom number (diastolic). It is best to have a blood pressure that is below 120/80. Healthy choices can help lower your blood pressure, or you may need medicine to help lower it. What are the causes? The cause of this condition is not known. Some conditions may be related to high blood pressure. What increases the risk?  Smoking.  Having type 2 diabetes mellitus, high cholesterol, or both.  Not getting enough exercise or physical activity.  Being overweight.  Having too much fat, sugar, calories, or salt (sodium) in your diet.  Drinking too much alcohol.  Having long-term (chronic) kidney disease.  Having a family history of high blood pressure.  Age. Risk increases with age.  Race. You may be at higher risk if you are African American.  Gender. Men are at higher risk than women before age 45. After age 65, women are at higher risk than men.  Having obstructive sleep apnea.  Stress. What are the signs or symptoms?  High blood pressure may not cause symptoms. Very high blood pressure (hypertensive crisis) may cause: ? Headache. ? Feelings of worry or nervousness (anxiety). ? Shortness of breath. ? Nosebleed. ? A feeling of being sick to your stomach (nausea). ? Throwing up (vomiting). ? Changes in how you see. ? Very bad chest pain. ? Seizures. How is this treated?  This condition is treated by making healthy lifestyle changes, such as: ? Eating healthy foods. ? Exercising more. ? Drinking less alcohol.  Your health care provider may prescribe medicine if lifestyle changes are not enough to get your blood pressure under control, and if: ? Your top number is above 130. ? Your bottom number is above  80.  Your personal target blood pressure may vary. Follow these instructions at home: Eating and drinking  If told, follow the DASH eating plan. To follow this plan: ? Fill one half of your plate at each meal with fruits and vegetables. ? Fill one fourth of your plate at each meal with whole grains. Whole grains include whole-wheat pasta, brown rice, and whole-grain bread. ? Eat or drink low-fat dairy products, such as skim milk or low-fat yogurt. ? Fill one fourth of your plate at each meal with low-fat (lean) proteins. Low-fat proteins include fish, chicken without skin, eggs, beans, and tofu. ? Avoid fatty meat, cured and processed meat, or chicken with skin. ? Avoid pre-made or processed food.  Eat less than 1,500 mg of salt each day.  Do not drink alcohol if: ? Your doctor tells you not to drink. ? You are pregnant, may be pregnant, or are planning to become pregnant.  If you drink alcohol: ? Limit how much you use to:  0-1 drink a day for women.  0-2 drinks a day for men. ? Be aware of how much alcohol is in your drink. In the [place] one drink equals one 12 oz bottle of beer (355 mL), one 5 oz glass of wine (148 mL), or one 1 oz glass of hard liquor (44 mL).   Lifestyle  Work with your doctor to stay at a healthy weight or to lose weight. Ask your doctor what the best weight is for you.  Get at least 30 minutes of exercise most   days of the week. This may include walking, swimming, or biking.  Get at least 30 minutes of exercise that strengthens your muscles (resistance exercise) at least 3 days a week. This may include lifting weights or doing Pilates.  Do not use any products that contain nicotine or tobacco, such as cigarettes, e-cigarettes, and chewing tobacco. If you need help quitting, ask your doctor.  Check your blood pressure at home as told by your doctor.  Keep all follow-up visits as told by your doctor. This is important.   Medicines  Take over-the-counter  and prescription medicines only as told by your doctor. Follow directions carefully.  Do not skip doses of blood pressure medicine. The medicine does not work as well if you skip doses. Skipping doses also puts you at risk for problems.  Ask your doctor about side effects or reactions to medicines that you should watch for. Contact a doctor if you:  Think you are having a reaction to the medicine you are taking.  Have headaches that keep coming back (recurring).  Feel dizzy.  Have swelling in your ankles.  Have trouble with your vision. Get help right away if you:  Get a very bad headache.  Start to feel mixed up (confused).  Feel weak or numb.  Feel faint.  Have very bad pain in your: ? Chest. ? Belly (abdomen).  Throw up more than once.  Have trouble breathing. Summary  Hypertension is another name for high blood pressure.  High blood pressure forces your heart to work harder to pump blood.  For most people, a normal blood pressure is less than 120/80.  Making healthy choices can help lower blood pressure. If your blood pressure does not get lower with healthy choices, you may need to take medicine. This information is not intended to replace advice given to you by your health care provider. Make sure you discuss any questions you have with your health care provider. Document Revised: 11/18/2017 Document Reviewed: 11/18/2017 Elsevier Patient Education  [place]. Diabetes Mellitus and Exercise Exercising regularly is important for overall health, especially for people who have diabetes mellitus. Exercising is not only about losing weight. It has many other health benefits, such as increasing muscle strength and bone density and reducing body fat and stress. This leads to improved fitness, flexibility, and endurance, all of which result in better overall health. What are the benefits of exercise if I have diabetes? Exercise has many benefits for people with  diabetes. They include:  Helping to lower and control blood sugar (glucose).  Helping the body to respond better to the hormone insulin by improving insulin sensitivity.  Reducing how much insulin the body needs.  Lowering the risk for heart disease by: ? Lowering "bad" cholesterol and triglyceride levels. ? Increasing "good" cholesterol levels. ? Lowering blood pressure. ? Lowering blood glucose levels. What is my activity plan? Your health care provider or certified diabetes educator can help you make a plan for the type and frequency of exercise that works for you. This is called your activity plan. Be sure to:  Get at least 150 minutes of medium-intensity or high-intensity exercise each week. Exercises may include brisk walking, biking, or water aerobics.  Do stretching and strengthening exercises, such as yoga or weight lifting, at least 2 times a week.  Spread out your activity over at least 3 days of the week.  Get some form of physical activity each day. ? Do not go more than 2 days in  a row without some kind of physical activity. ? Avoid being inactive for more than 90 minutes at a time. Take frequent breaks to walk or stretch.  Choose exercises or activities that you enjoy. Set realistic goals.  Start slowly and gradually increase your exercise intensity over time.   How do I manage my diabetes during exercise? Monitor your blood glucose  Check your blood glucose before and after exercising. If your blood glucose is: ? 240 mg/dL (13.3 mmol/L) or higher before you exercise, check your urine for ketones. These are chemicals created by the liver. If you have ketones in your urine, do not exercise until your blood glucose returns to normal. ? 100 mg/dL (5.6 mmol/L) or lower, eat a snack containing 15-20 grams of carbohydrate. Check your blood glucose 15 minutes after the snack to make sure that your glucose level is above 100 mg/dL (5.6 mmol/L) before you start your  exercise.  Know the symptoms of low blood glucose (hypoglycemia) and how to treat it. Your risk for hypoglycemia increases during and after exercise. Follow these tips and your health care provider's instructions  Keep a carbohydrate snack that is fast-acting for use before, during, and after exercise to help prevent or treat hypoglycemia.  Avoid injecting insulin into areas of the body that are going to be exercised. For example, avoid injecting insulin into: ? Your arms, when you are about to play tennis. ? Your legs, when you are about to go jogging.  Keep records of your exercise habits. Doing this can help you and your health care provider adjust your diabetes management plan as needed. Write down: ? Food that you eat before and after you exercise. ? Blood glucose levels before and after you exercise. ? The type and amount of exercise you have done.  Work with your health care provider when you start a new exercise or activity. He or she may need to: ? Make sure that the activity is safe for you. ? Adjust your insulin, other medicines, and food that you eat.  Drink plenty of water while you exercise. This prevents loss of water (dehydration) and problems caused by a lot of heat in the body (heat stroke).   Where to find more information  American Diabetes Association: www.diabetes.org Summary  Exercising regularly is important for overall health, especially for people who have diabetes mellitus.  Exercising has many health benefits. It increases muscle strength and bone density and reduces body fat and stress. It also lowers and controls blood glucose.  Your health care provider or certified diabetes educator can help you make an activity plan for the type and frequency of exercise that works for you.  Work with your health care provider to make sure any new activity is safe for you. Also work with your health care provider to adjust your insulin, other medicines, and the food  you eat. This information is not intended to replace advice given to you by your health care provider. Make sure you discuss any questions you have with your health care provider. Document Revised: 12/06/2018 Document Reviewed: 12/06/2018 Elsevier Patient Education  2021 [place].    Chronic Kidney Disease, Adult Chronic kidney disease is when lasting damage happens to the kidneys slowly over a long time. The kidneys help to:  Make pee (urine).  Make hormones.  Keep the right amount of fluids and chemicals in the body. Most often, this disease does not go away. You must take steps to help keep the kidney damage  from getting worse. If steps are not taken, the kidneys might stop working forever. What are the causes?  Diabetes.  High blood pressure.  Diseases that affect the heart and blood vessels.  Other kidney diseases.  Diseases of the body's disease-fighting system.  A problem with the flow of pee.  Infections of the organs that make pee, store it, and take it out of the body.  Swelling or irritation of your blood vessels. What increases the risk?  Getting older.  Having someone in your family who has kidney disease or kidney failure.  Having a disease caused by genes.  Taking medicines often that harm the kidneys.  Being near or having contact with harmful substances.  Being very overweight.  Using tobacco now or in the past. What are the signs or symptoms?  Feeling very tired.  Having a swollen face, legs, ankles, or feet.  Feeling like you may vomit or vomiting.  Not feeling hungry.  Being confused or not able to focus.  Twitches and cramps in the leg muscles or other muscles.  Dry, itchy skin.  A taste of metal in your mouth.  Making less pee, or making more pee.  Shortness of breath.  Trouble sleeping. You may also become anemic or get weak bones. Anemic means there is not enough red blood cells or hemoglobin in your blood. You may  get symptoms slowly. You may not notice them until the kidney damage gets very bad. How is this treated? Often, there is no cure for this disease. Treatment can help with symptoms and help keep the disease from getting worse. You may need to:  Avoid alcohol.  Avoid foods that are high in salt, potassium, phosphorous, and protein.  Take medicines for symptoms and to help control other conditions.  Have dialysis. This treatment gets harmful waste out of your body.  Treat other problems that cause your kidney disease or make it worse. Follow these instructions at home: Medicines  Take over-the-counter and prescription medicines only as told by your doctor.  Do not take any new medicines, vitamins, or supplements unless your doctor says it is okay. Lifestyle  Do not smoke or use any products that contain nicotine or tobacco. If you need help quitting, ask your doctor.  If you drink alcohol: ? Limit how much you use to:  0-1 drink a day for women who are not pregnant.  0-2 drinks a day for men. ? Know how much alcohol is in your drink. In the [place] one drink equals one 12 oz bottle of beer (355 mL), one 5 oz glass of wine (148 mL), or one 1 oz glass of hard liquor (44 mL).  Stay at a healthy weight. If you need help losing weight, ask your doctor.   General instructions  Follow instructions from your doctor about what you cannot eat or drink.  Track your blood pressure at home. Tell your doctor about any changes.  If you have diabetes, track your blood sugar.  Exercise at least 30 minutes a day, 5 days a week.  Keep your shots (vaccinations) up to date.  Keep all follow-up visits.   Where to find more information  American Association of Kidney Patients: BombTimer.gl  National Kidney Foundation: www.kidney.[place]: https://mathis.com/  Life Options: www.lifeoptions.org  Kidney School: www.kidneyschool.org Contact a doctor if:  Your symptoms get  worse.  You get new symptoms. Get help right away if:  You get symptoms of end-stage kidney disease. These include: ?  Headaches. ? Losing feeling in your hands or feet. ? Easy bruising. ? Having hiccups often. ? Chest pain. ? Shortness of breath. ? Lack of menstrual periods, in women.  You have a fever.  You make less pee than normal.  You have pain or you bleed when you pee or poop. These symptoms may be an emergency. Get help right away. Call your local emergency services (911 in the [place]  Do not wait to see if the symptoms will go away.  Do not drive yourself to the hospital. Summary  Chronic kidney disease is when lasting damage happens to the kidneys slowly over a long time.  Causes of this disease include diabetes and high blood pressure.  Often, there is no cure for this disease. Treatment can help symptoms and help keep the disease from getting worse.  Treatment may involve lifestyle changes, medicines, and dialysis. This information is not intended to replace advice given to you by your health care provider. Make sure you discuss any questions you have with your health care provider. Document Revised: 06/15/2019 Document Reviewed: 06/15/2019 Elsevier Patient Education  [place].

## 2020-04-19 LAB — HEMOGLOBIN A1C
Est. average glucose Bld gHb Est-mCnc: 148 mg/dL
Hgb A1c MFr Bld: 6.8 % — ABNORMAL HIGH (ref 4.8–5.6)

## 2020-04-20 DIAGNOSIS — C50911 Malignant neoplasm of unspecified site of right female breast: Secondary | ICD-10-CM | POA: Diagnosis not present

## 2020-04-24 ENCOUNTER — Ambulatory Visit: Payer: Medicare Other | Attending: Hematology

## 2020-04-24 ENCOUNTER — Other Ambulatory Visit: Payer: Self-pay

## 2020-04-24 DIAGNOSIS — I890 Lymphedema, not elsewhere classified: Secondary | ICD-10-CM | POA: Diagnosis not present

## 2020-04-24 DIAGNOSIS — L599 Disorder of the skin and subcutaneous tissue related to radiation, unspecified: Secondary | ICD-10-CM | POA: Diagnosis not present

## 2020-04-24 NOTE — Therapy (Addendum)
[place], [place], [address] Phone: [phone]   Fax:  [phone]  Physical Therapy Treatment  Patient Details  Name: Elizabeth Mcfarland MRN: [id number] Date of Birth: 03/23/1941 Referring Provider (PT): Burr Medico   Encounter Date: 04/24/2020   PT End of Session - 04/24/20 1209    Visit Number 4    Number of Visits 9    Date for PT Re-Evaluation 05/03/20    PT Start Time 1102    PT Stop Time 1215    PT Time Calculation (min) 73 min    Activity Tolerance Patient tolerated treatment well    Behavior During Therapy [facility] for tasks assessed/performed           Past Medical History:  Diagnosis Date  . Allergy   . Anemia   . Blood transfusion without reported diagnosis   . Breast cancer ([place]) 11/2018   Right breast invasive mammary carcinoma  . Cancer ([place])   . Cataract   . Chronic kidney disease   . Diabetes mellitus without complication ([place])   . Esophageal ulcer with bleeding 05/05/2013   05/05/2013 EGD linear distal esophageal ulcer - cause of hematemesis prior to EGD   . GERD (gastroesophageal reflux disease)   . History of kidney stones   . Hyperlipidemia   . Hypertension   . Kidney cysts   . Personal history of radiation therapy   . Sarcoidosis 1970    Past Surgical History:  Procedure Laterality Date  . ABDOMINAL HYSTERECTOMY    . BREAST EXCISIONAL BIOPSY Left 11/2018   Fibroadenoma  . BREAST LUMPECTOMY    . BREAST LUMPECTOMY WITH RADIOACTIVE SEED AND SENTINEL LYMPH NODE BIOPSY Bilateral 11/25/2018   Procedure: BILATERAL BREAST LUMPECTOMIES  WITH BILATERAL  RADIOACTIVE SEEDS AND RIGHT SENTINEL LYMPH NODE BIOPSY;  Surgeon: [name], MD;  Location: [facility];  Service: General;  Laterality: Bilateral;  . CATARACT EXTRACTION W/ INTRAOCULAR LENS IMPLANT    . COLONOSCOPY    . COLONOSCOPY, ESOPHAGOGASTRODUODENOSCOPY (EGD) AND ESOPHAGEAL DILATION N/A 05/05/2013   Procedure: COLONOSCOPY,  ESOPHAGOGASTRODUODENOSCOPY (EGD) AND ESOPHAGEAL DILATION (ED);  Surgeon: Gatha Mayer, MD;  [facility];  Service: Endoscopy;  Laterality: N/A;  . LITHOTRIPSY    . POLYPECTOMY    . UPPER GASTROINTESTINAL ENDOSCOPY      There were no vitals filed for this visit.   Subjective Assessment - 04/24/20 1105    Subjective I got my new compression bra and I love it! I haven't wanted to take it off. I got 4 of them and only had to pay a little out of pocket for each so I was pleased with that. I can tell the breast swelling is improving because  although it's still there it's better than it was before.    Pertinent History R breast cancer- bilateral lumpectomy (L not cancerous) on 12/05/18 with SLNB on R, completed radiation, diabetes, kidney stones    Patient Stated Goals to get the swelling down    Currently in Pain? No/denies                             [facility] Adult PT Treatment/Exercise - 04/24/20 0001      Shoulder Exercises: Standing   Flexion AAROM;Right   with dowel, 3 reps   Flexion Limitations In front of mirror for visual cuing and returned therapist demo    ABduction AAROM;Right   with dowel, 3  reps     Shoulder Exercises: IT sales professional 3 reps;10 seconds   in doorway returning therapist demo     Manual Therapy   Myofascial Release To Rt axilla during P/ROM of shoulder    Manual Lymphatic Drainage (MLD) In Supine with HOB elevated: Short neck, superficial and deep abdominals, Rt inguinal and Lt axillary nodes, Rt axillo-inguinal and anterior inter-axillary anastomosis, then focused on superior Rt breast redirecting towards inter-axillary anastomosis, then into Lt S/L for focus to lateral and inferior breast redirecting towards axillo-inguinal anastomosis, then finished in supine retracing all steps reviewing with pt and having her return final demo of each step at end of session    Passive ROM In Supine to Rt shoulder into flexion, abduction and  D2 to pts available end motions and scapular depression to prevent compensation techniques                  PT Education - 04/24/20 1225    Education Details Standing dowel exercises    Person(s) Educated Patient    Methods Explanation;Demonstration;Handout    Comprehension Verbalized understanding;Returned demonstration;Need further instruction               PT Long Term Goals - 04/24/20 1220      PT LONG TERM GOAL #1   Title Pt will be independent in self MLD for long term management of lymphedema.    Baseline Pt reports doing this daily-04/24/20    Status Achieved      PT LONG TERM GOAL #2   Title Pt will obtain a compression bra for long term management of lymphedema.    Baseline Pt has received this and wears daily-04/24/20    Status Achieved      PT LONG TERM GOAL #3   Title Pt will demonstrate a 50% reduction in fibrosis in breast to decrease risk of infection.    Baseline pt reports 75% improvement-04/24/20    Status Achieved      PT LONG TERM GOAL #4   Title Pt will report a 50% improvement in scar tissue at bilateral breast lumpectomy scars to improve skin mobility.    Baseline pt reports 75% improvement-04/24/20    Status Achieved                 Plan - 04/24/20 1209    Clinical Impression Statement Continued with manual therapy to Rt upper quadrant working to decrease breast lymphedema, myofascial tightness at axilla and Rt shoulder tightness. Overall pt is reporting noticing her lymphedema is getting softer. She got her compression bra and has been wearing it since Friday reporting this very comfortable. Pt reports doing very well, has met all goals and would like to D/C at this time.    Personal Factors and Comorbidities Time since onset of injury/illness/exacerbation    Stability/Clinical Decision Making Stable/Uncomplicated    Rehab Potential Good    PT Frequency 2x / week    PT Duration 4 weeks    PT Treatment/Interventions ADLs/Self Care Home  Management;Therapeutic exercise;Patient/family education;Manual techniques;Manual lymph drainage;Compression bandaging;Scar mobilization;Taping;Vasopneumatic Device    PT Next Visit Plan D/C this visit.    PT Home Exercise Plan wear foam chip pack in bra; self MLD 1-2x/daily, wear compression bra daily    Consulted and Agree with Plan of Care Patient           Patient will benefit from skilled therapeutic intervention in order to improve the following deficits and impairments:  Postural dysfunction,Decreased  knowledge of precautions,Increased edema,Decreased scar mobility  Visit Diagnosis: Lymphedema, not elsewhere classified  Disorder of the skin and subcutaneous tissue related to radiation, unspecified     Problem List Patient Active Problem List   Diagnosis Date Noted  . Malignant neoplasm of upper-inner quadrant of right breast in female, estrogen receptor positive ([place]) 10/11/2018  . Localized skin eruption 10/07/2018  . Breast mass seen on mammogram 10/07/2018  . Gastroesophageal reflux disease without esophagitis 10/07/2018  . Chest pain 12/23/2015  . Essential hypertension   . Diabetes mellitus type 2 in nonobese (HCC)   . Anemia 06/21/2013  . Personal history of colonic polyps 05/04/2013  . Type II diabetes mellitus with manifestations, uncontrolled ([place]) 02/21/2008  . HYPERTENSION 02/21/2008  . IBS 02/21/2008  . NEPHROLITHIASIS, HX OF 02/21/2008    Otelia Limes, PTA 04/24/2020, 12:45 PM  [place], [place], [address] Phone: [phone]   Fax:  [phone]  Name: Elizabeth Mcfarland MRN: [id number] Date of Birth: [birth year]  PHYSICAL THERAPY DISCHARGE SUMMARY  Visits from Start of Care: 4  Current functional level related to goals / functional outcomes: All goals met   Remaining deficits: None   Education / Equipment: Self MLD, compression bra  Plan: Patient  agrees to discharge.  Patient goals were met. Patient is being discharged due to meeting the stated rehab goals.  ?????    Allyson Sabal Bar[place], [place] 04/24/20 3:27 PM

## 2020-04-24 NOTE — Patient Instructions (Signed)
Flexion (Eccentric) - Active-Assist (Cane)          Cancer Rehab [phone]    Use unaffected arm to push affected arm forward. Avoid hiking shoulder (shoulder should NOT touch cheek). Keep palm relaxed. Slowly lower affected arm. Hold stretch for _5_ seconds repeating _5-10_ times, _1-2_ times a day.  Abduction (Eccentric) - Active-Assist (Cane)    Use unaffected arm to push affected arm out to side. Avoid hiking shoulder (shoulder should NOT touch cheek). Keep palm relaxed. Slowly lower affected arm. Hold stretch _5_ seconds repeating _5-10_ times, _1-2_ times a day.  CHEST: Doorway, Bilateral - Standing    Standing in doorway, place hands on wall with elbows bent at shoulder height and place one foot in front of other. Shift weight onto front foot. Hold _10-20__ seconds. Do _3-5_ times, _1-2_ times a day.     

## 2020-05-16 DIAGNOSIS — H401132 Primary open-angle glaucoma, bilateral, moderate stage: Secondary | ICD-10-CM | POA: Diagnosis not present

## 2020-05-22 ENCOUNTER — Other Ambulatory Visit: Payer: Self-pay | Admitting: Nurse Practitioner

## 2020-05-22 DIAGNOSIS — E1169 Type 2 diabetes mellitus with other specified complication: Secondary | ICD-10-CM

## 2020-05-27 ENCOUNTER — Encounter (HOSPITAL_COMMUNITY): Payer: Self-pay

## 2020-05-27 ENCOUNTER — Other Ambulatory Visit: Payer: Self-pay

## 2020-05-27 ENCOUNTER — Ambulatory Visit (HOSPITAL_COMMUNITY)
Admission: EM | Admit: 2020-05-27 | Discharge: 2020-05-27 | Disposition: A | Discharge status: Home / Self Care | Payer: Medicare Other | Attending: Medical Oncology | Admitting: Medical Oncology

## 2020-05-27 DIAGNOSIS — Z7982 Long term (current) use of aspirin: Secondary | ICD-10-CM | POA: Insufficient documentation

## 2020-05-27 DIAGNOSIS — R739 Hyperglycemia, unspecified: Secondary | ICD-10-CM | POA: Diagnosis not present

## 2020-05-27 DIAGNOSIS — Z7984 Long term (current) use of oral hypoglycemic drugs: Secondary | ICD-10-CM | POA: Insufficient documentation

## 2020-05-27 DIAGNOSIS — Z79899 Other long term (current) drug therapy: Secondary | ICD-10-CM | POA: Insufficient documentation

## 2020-05-27 DIAGNOSIS — Z885 Allergy status to narcotic agent status: Secondary | ICD-10-CM | POA: Diagnosis not present

## 2020-05-27 LAB — POCT URINALYSIS DIPSTICK, ED / UC
Bilirubin Urine: NEGATIVE
Glucose, UA: NEGATIVE mg/dL
Hgb urine dipstick: NEGATIVE
Ketones, ur: NEGATIVE mg/dL
Leukocytes,Ua: NEGATIVE
Nitrite: NEGATIVE
Protein, ur: NEGATIVE mg/dL
Specific Gravity, Urine: 1.01 (ref 1.005–1.030)
Urobilinogen, UA: 0.2 mg/dL (ref 0.0–1.0)
pH: 6 (ref 5.0–8.0)

## 2020-05-27 NOTE — ED Triage Notes (Signed)
Pt presents with hyperglycemia the past few days for unknown reason; pt states no change in diet or medications.

## 2020-05-27 NOTE — ED Provider Notes (Signed)
[place]    CSN: [id number] Arrival date & time: 05/27/20  1501      History   Chief Complaint Chief Complaint  Patient presents with  . Hyperglycemia    HPI Elizabeth Mcfarland is a 80 y.o. female.   HPI   Hyperglycemia: Patient reports that over the past 4 to 5 days she has had elevated blood sugar readings.  She reports that her normal blood sugar readings are around 130s although it does vary from time to time.  Upon further questioning she states that she has been stressed over the past few months due to the loss of many family members and some troubles with her fridge at home and so she has noticed that her sugars are consistently more irregular than normal.  Over the past 4 to 5 days she has had sugars in the upper 200 range.  She denies any diet changes, medication changes.  No known skin infections, dysuria, urinary frequency, abdominal pain or fevers. She has not taken anything for symptoms.   Past Medical History:  Diagnosis Date  . Allergy   . Anemia   . Blood transfusion without reported diagnosis   . Breast cancer ([place]) 11/2018   Right breast invasive mammary carcinoma  . Cancer ([place])   . Cataract   . Chronic kidney disease   . Diabetes mellitus without complication ([place])   . Esophageal ulcer with bleeding 05/05/2013   05/05/2013 EGD linear distal esophageal ulcer - cause of hematemesis prior to EGD   . GERD (gastroesophageal reflux disease)   . History of kidney stones   . Hyperlipidemia   . Hypertension   . Kidney cysts   . Personal history of radiation therapy   . Sarcoidosis 1970    Patient Active Problem List   Diagnosis Date Noted  . Malignant neoplasm of upper-inner quadrant of right breast in female, estrogen receptor positive ([place]) 10/11/2018  . Localized skin eruption 10/07/2018  . Breast mass seen on mammogram 10/07/2018  . Gastroesophageal reflux disease without esophagitis 10/07/2018  . Chest pain 12/23/2015  . Essential  hypertension   . Diabetes mellitus type 2 in nonobese (HCC)   . Anemia 06/21/2013  . Personal history of colonic polyps 05/04/2013  . Type II diabetes mellitus with manifestations, uncontrolled ([place]) 02/21/2008  . HYPERTENSION 02/21/2008  . IBS 02/21/2008  . NEPHROLITHIASIS, HX OF 02/21/2008    Past Surgical History:  Procedure Laterality Date  . ABDOMINAL HYSTERECTOMY    . BREAST EXCISIONAL BIOPSY Left 11/2018   Fibroadenoma  . BREAST LUMPECTOMY    . BREAST LUMPECTOMY WITH RADIOACTIVE SEED AND SENTINEL LYMPH NODE BIOPSY Bilateral 11/25/2018   Procedure: BILATERAL BREAST LUMPECTOMIES  WITH BILATERAL  RADIOACTIVE SEEDS AND RIGHT SENTINEL LYMPH NODE BIOPSY;  Surgeon: [name], MD;  Location: [facility];  Service: General;  Laterality: Bilateral;  . CATARACT EXTRACTION W/ INTRAOCULAR LENS IMPLANT    . COLONOSCOPY    . COLONOSCOPY, ESOPHAGOGASTRODUODENOSCOPY (EGD) AND ESOPHAGEAL DILATION N/A 05/05/2013   Procedure: COLONOSCOPY, ESOPHAGOGASTRODUODENOSCOPY (EGD) AND ESOPHAGEAL DILATION (ED);  Surgeon: Gatha Mayer, MD;  [facility];  Service: Endoscopy;  Laterality: N/A;  . LITHOTRIPSY    . POLYPECTOMY    . UPPER GASTROINTESTINAL ENDOSCOPY      OB History   No obstetric history on file.      Home Medications    Prior to Admission medications   Medication Sig Start Date End Date Taking? Authorizing Provider  acetaminophen (TYLENOL) 500 MG  tablet Take 1,000 mg by mouth every 6 (six) hours as needed for moderate pain or headache.     [provider]  amLODipine (NORVASC) 10 MG tablet TAKE 1 TABLET BY MOUTH EVERY DAY 02/04/20   [name], FNP  anastrozole (ARIMIDEX) 1 MG tablet TAKE 1 TABLET(1 MG) BY MOUTH DAILY 01/02/20   [name], MD  Ascorbic Acid (VITAMIN C) 1000 MG tablet Take 1,000 mg by mouth daily.    [provider]  aspirin EC 81 MG tablet Take 81 mg by mouth daily.    [provider]  atorvastatin (LIPITOR) 20 MG tablet Take 1  tablet (20 mg total) by mouth at bedtime. 01/29/20   [name], FNP  cholecalciferol (VITAMIN D) 1000 UNITS tablet Take 1,000 Units by mouth daily.     [provider]  glimepiride (AMARYL) 4 MG tablet TAKE 1 TABLET BY MOUTH TWICE DAILY 05/22/20   [name], FNP  JANUVIA 50 MG tablet TAKE 1 TABLET(50 MG) BY MOUTH DAILY 01/02/20   [name], FNP  Multiple Vitamin (MULTIVITAMIN WITH MINERALS) TABS tablet Take 1 tablet by mouth daily.     [provider]  [facility] ULTRA test strip USE AS DIRECTED TO CHECK BLOOD SUGAR TWICE DAILY 12/26/19   [name], FNP  Polyethyl Glycol-Propyl Glycol (SYSTANE OP) Place 1 drop into both eyes 2 (two) times daily.    [provider]  polyethylene glycol (MIRALAX / GLYCOLAX) packet Take 17 g by mouth daily.     [provider]    Family History Family History  Problem Relation Age of Onset  . Diabetes Sister   . Kidney failure Sister   . Colon polyps Sister   . CAD Maternal Grandmother   . Early death Mother   . Stroke Father   . Stroke Brother   . Colon polyps Brother   . Cancer Brother 36       prostate cancer  . Heart disease Brother   . Emphysema Brother   . Cancer Brother 47       prostate cancer  . Asthma Brother   . Breast cancer Neg Hx   . Colon cancer Neg Hx   . Esophageal cancer Neg Hx   . Rectal cancer Neg Hx   . Stomach cancer Neg Hx     Social History Social History   Tobacco Use  . Smoking status: Never Smoker  . Smokeless tobacco: Never Used  Vaping Use  . Vaping Use: Never used  Substance Use Topics  . Alcohol use: No  . Drug use: No     Allergies   Codeine and Percocet [oxycodone-acetaminophen]   Review of Systems Review of Systems  As stated above in HPI Physical Exam Triage Vital Signs ED Triage Vitals  Enc Vitals Group     BP 05/27/20 1513 (!) 143/68     Pulse Rate 05/27/20 1513 72     Resp 05/27/20 1513 18     Temp 05/27/20 1513 99.3 F (37.4 C)     Temp  Source 05/27/20 1513 Oral     SpO2 05/27/20 1513 100 %     Weight --      Height --      Head Circumference --      Peak Flow --      Pain Score 05/27/20 1512 0     Pain Loc --      Pain Edu? --      Excl. in [place]? --  No data found.  Updated Vital Signs BP (!) 143/68 (BP Location: Right Arm)   Pulse 72   Temp 99.3 F (37.4 C) (Oral)   Resp 18   SpO2 100%   Physical Exam Vitals and nursing note reviewed.  Constitutional:      General: She is not in acute distress.    Appearance: Normal appearance. She is not ill-appearing, toxic-appearing or diaphoretic.  HENT:     Head: Normocephalic.     Mouth/Throat:     Mouth: Mucous membranes are moist.  Eyes:     Extraocular Movements: Extraocular movements intact.     Pupils: Pupils are equal, round, and reactive to light.  Cardiovascular:     Rate and Rhythm: Normal rate and regular rhythm.     Heart sounds: Normal heart sounds.  Pulmonary:     Effort: Pulmonary effort is normal.     Breath sounds: Normal breath sounds.  Abdominal:     General: Abdomen is flat. Bowel sounds are normal. There is no distension.     Palpations: Abdomen is soft. There is no mass.     Tenderness: There is no abdominal tenderness. There is no right CVA tenderness, left CVA tenderness, guarding or rebound.     Hernia: No hernia is present.  Musculoskeletal:     Cervical back: Normal range of motion and neck supple.  Lymphadenopathy:     Cervical: No cervical adenopathy.  Skin:    General: Skin is warm.  Neurological:     Mental Status: She is alert.  Psychiatric:        Mood and Affect: Mood normal.        Behavior: Behavior normal.      UC Treatments / Results  Labs (all labs ordered are listed, but only abnormal results are displayed) Labs Reviewed  URINE CULTURE  POCT URINALYSIS DIPSTICK, ED / UC    EKG   Radiology No results found.  Procedures Procedures (including critical care time)  Medications Ordered in  UC Medications - No data to display  Initial Impression / Assessment and Plan / UC Course  I have reviewed the triage vital signs and the nursing notes.  Pertinent labs & imaging results that were available during my care of the patient were reviewed by me and considered in my medical decision making (see chart for details).     New.  We will ensure that her urine does not show any sign of urinary tract infection however this is likely secondary to stress.  I discussed patient that common triggers include illness, stress, medication changes, diet changes.  I want her to follow-up with her primary care provider, stay hydrated with water and follow a low carbohydrate diet.  We discussed red flag symptom ranges. Final Clinical Impressions(s) / UC Diagnoses   Final diagnoses:  Hyperglycemia   Discharge Instructions   None    ED Prescriptions    None     PDMP not reviewed this encounter.   Hughie Closs, [place] 05/27/20 1541

## 2020-05-28 LAB — URINE CULTURE: Culture: NO GROWTH

## 2020-05-31 ENCOUNTER — Other Ambulatory Visit: Payer: Self-pay | Admitting: Nurse Practitioner

## 2020-05-31 ENCOUNTER — Other Ambulatory Visit: Payer: Self-pay

## 2020-05-31 ENCOUNTER — Encounter: Payer: Self-pay | Admitting: Nurse Practitioner

## 2020-05-31 ENCOUNTER — Ambulatory Visit (INDEPENDENT_AMBULATORY_CARE_PROVIDER_SITE_OTHER): Payer: Medicare Other | Admitting: Nurse Practitioner

## 2020-05-31 VITALS — BP 120/78 | HR 65 | Temp 98.5°F | Ht 63.4 in | Wt 132.6 lb

## 2020-05-31 DIAGNOSIS — R12 Heartburn: Secondary | ICD-10-CM

## 2020-05-31 DIAGNOSIS — R202 Paresthesia of skin: Secondary | ICD-10-CM

## 2020-05-31 DIAGNOSIS — I10 Essential (primary) hypertension: Secondary | ICD-10-CM | POA: Diagnosis not present

## 2020-05-31 DIAGNOSIS — R739 Hyperglycemia, unspecified: Secondary | ICD-10-CM | POA: Diagnosis not present

## 2020-05-31 MED ORDER — OMEPRAZOLE MAGNESIUM 20 MG PO TBEC: 20.0000 mg | DELAYED_RELEASE_TABLET | Freq: Every day | ORAL | 1 refills | Status: DC

## 2020-05-31 NOTE — Progress Notes (Signed)
I,[name] as a Education administrator for [employer], FNP.,have documented all relevant documentation on the behalf of [name], FNP,as directed by  [name], FNP while in the presence of [name], [name]. This visit occurred during the SARS-CoV-2 public health emergency.  Safety protocols were in place, including screening questions prior to the visit, additional usage of staff PPE, and extensive cleaning of exam room while observing appropriate contact time as indicated for disinfecting solutions.  Subjective:     Patient ID: Elizabeth Mcfarland , female    DOB: [birth year] , 80 y.o.   MRN: [id number]   Chief Complaint  Patient presents with  . Diabetes  . Hypertension  . iron     HPI  She is here today due to having an elevated blood pressure. On Saturday her blood pressure increased and she was not feeling well. She has been under more stress recently.   Blood sugar on Sund 197, Mon 133, Tues 71, Wed 93 and thurs morning 112.  170/84 blood pressure this morning and took her medications this morning. She did not bring her blood pressure cuff. Last night her blood pressure was up to 210/110. She ate baked chicken with a little bit of sweet and sour sauce and mixed vegetables. One night last week she had numbness in her left leg and now has a "funny feeling" to the bottom of her feet.   Wt Readings from Last 3 Encounters: 05/31/20 : 132 lb 9.6 oz (60.1 kg) 04/18/20 : 135 lb (61.2 kg) 04/02/20 : 137 lb 1.6 oz (62.2 kg)  She reports taking 2 rolaids a day mostly after lunch.       Past Medical History:  Diagnosis Date  . Allergy   . Anemia   . Blood transfusion without reported diagnosis   . Breast cancer ([place]) 11/2018   Right breast invasive mammary carcinoma  . Cancer ([place])   . Cataract   . Chronic kidney disease   . Diabetes mellitus without complication ([place])   . Esophageal ulcer with bleeding 05/05/2013   05/05/2013 EGD linear distal esophageal ulcer - cause  of hematemesis prior to EGD   . GERD (gastroesophageal reflux disease)   . History of kidney stones   . Hyperlipidemia   . Hypertension   . Kidney cysts   . Personal history of radiation therapy   . Sarcoidosis 1970     Family History  Problem Relation Age of Onset  . Diabetes Sister   . Kidney failure Sister   . Colon polyps Sister   . CAD Maternal Grandmother   . Early death Mother   . Stroke Father   . Stroke Brother   . Colon polyps Brother   . Cancer Brother 7       prostate cancer  . Heart disease Brother   . Emphysema Brother   . Cancer Brother 32       prostate cancer  . Asthma Brother   . Breast cancer Neg Hx   . Colon cancer Neg Hx   . Esophageal cancer Neg Hx   . Rectal cancer Neg Hx   . Stomach cancer Neg Hx      Current Outpatient Medications:  .  acetaminophen (TYLENOL) 500 MG tablet, Take 1,000 mg by mouth every 6 (six) hours as needed for moderate pain or headache. , Disp: , Rfl:  .  amLODipine (NORVASC) 10 MG tablet, TAKE 1 TABLET BY MOUTH EVERY DAY, Disp: 90 tablet, Rfl: 1 .  anastrozole (ARIMIDEX) 1 MG tablet, TAKE 1 TABLET(1 MG) BY MOUTH DAILY, Disp: 30 tablet, Rfl: 3 .  Ascorbic Acid (VITAMIN C) 1000 MG tablet, Take 1,000 mg by mouth daily., Disp: , Rfl:  .  aspirin EC 81 MG tablet, Take 81 mg by mouth daily., Disp: , Rfl:  .  atorvastatin (LIPITOR) 20 MG tablet, Take 1 tablet (20 mg total) by mouth at bedtime., Disp: 90 tablet, Rfl: 1 .  cholecalciferol (VITAMIN D) 1000 UNITS tablet, Take 1,000 Units by mouth daily. , Disp: , Rfl:  .  glimepiride (AMARYL) 4 MG tablet, TAKE 1 TABLET BY MOUTH TWICE DAILY, Disp: 180 tablet, Rfl: 1 .  JANUVIA 50 MG tablet, TAKE 1 TABLET(50 MG) BY MOUTH DAILY, Disp: 90 tablet, Rfl: 1 .  Multiple Vitamin (MULTIVITAMIN WITH MINERALS) TABS tablet, Take 1 tablet by mouth daily. , Disp: , Rfl:  .  ONETOUCH ULTRA test strip, USE AS DIRECTED TO CHECK BLOOD SUGAR TWICE DAILY, Disp: 100 strip, Rfl: 3 .  Polyethyl Glycol-Propyl  Glycol (SYSTANE OP), Place 1 drop into both eyes 2 (two) times daily., Disp: , Rfl:  .  polyethylene glycol (MIRALAX / GLYCOLAX) packet, Take 17 g by mouth daily. , Disp: , Rfl:  .  omeprazole (PRILOSEC) 20 MG capsule, TAKE 1 TABLET(20 MG) BY MOUTH DAILY, Disp: 90 capsule, Rfl: 1   Allergies  Allergen Reactions  . Codeine Nausea And Vomiting  . Percocet [Oxycodone-Acetaminophen] Nausea And Vomiting     Review of Systems  Constitutional: Negative.   Respiratory: Negative.   Cardiovascular: Negative.   Gastrointestinal:       Pain underneath right rib area  Neurological:       Describes right foot tingling  Psychiatric/Behavioral: Negative.      Today's Vitals   05/31/20 1003  BP: 120/78  Pulse: 65  Temp: 98.5 F (36.9 C)  TempSrc: Oral  Weight: 132 lb 9.6 oz (60.1 kg)  Height: 5' 3.4" (1.61 m)  PainSc: 0-No pain   Body mass index is 23.19 kg/m.   Objective:  Physical Exam Constitutional:      General: She is not in acute distress.    Appearance: Normal appearance. She is normal weight.  Cardiovascular:     Rate and Rhythm: Normal rate and regular rhythm.     Pulses: Normal pulses.     Heart sounds: Normal heart sounds.  Pulmonary:     Effort: Pulmonary effort is normal.     Breath sounds: Normal breath sounds.  Abdominal:     General: Abdomen is flat. Bowel sounds are normal.     Palpations: Abdomen is soft.  Musculoskeletal:        General: Normal range of motion.     Cervical back: Normal range of motion and neck supple.  Skin:    General: Skin is warm and dry.     Capillary Refill: Capillary refill takes less than 2 seconds.  Neurological:     General: No focal deficit present.     Mental Status: She is alert and oriented to person, place, and time.  Psychiatric:        Mood and Affect: Mood normal.        Behavior: Behavior normal.        Thought Content: Thought content normal.        Judgment: Judgment normal.         Assessment And Plan:      1. Heartburn  I will check amylase, looking for gallbladder  issue vs heartburn  EKG done with Sinus bradycardia HR 59 - Amylase - EKG 12-Lead  2. Essential hypertension . B/P is controlled, no longer elevated . The importance of regular exercise and dietary modification was stressed to the patient.   3. Hyperglycemia  Blood sugars have now improved since she went to the Urgent care   I reviewed the urgent care notes and her blood pressure was slightly elevated, no interventions done at that time  4. Tingling of skin  Reported tingling to right foot, sensation is normal  Will check for metabolic cause. - TSH     Patient was given opportunity to ask questions. Patient verbalized understanding of the plan and was able to repeat key elements of the plan. All questions were answered to their satisfaction.  [name], FNP   I, [name], FNP, have reviewed all documentation for this visit. The documentation on 06/03/20 for the exam, diagnosis, procedures, and orders are all accurate and complete.    THE PATIENT IS ENCOURAGED TO PRACTICE SOCIAL DISTANCING DUE TO THE COVID-19 PANDEMIC.

## 2020-05-31 NOTE — Patient Instructions (Signed)

## 2020-06-01 LAB — TSH: TSH: 1.15 u[IU]/mL (ref 0.450–4.500)

## 2020-06-01 LAB — AMYLASE: Amylase: 200 U/L — ABNORMAL HIGH (ref 31–110)

## 2020-06-03 ENCOUNTER — Other Ambulatory Visit: Payer: Self-pay | Admitting: Nurse Practitioner

## 2020-06-03 DIAGNOSIS — IMO0002 Reserved for concepts with insufficient information to code with codable children: Secondary | ICD-10-CM

## 2020-06-03 DIAGNOSIS — E1165 Type 2 diabetes mellitus with hyperglycemia: Secondary | ICD-10-CM

## 2020-06-03 DIAGNOSIS — R12 Heartburn: Secondary | ICD-10-CM

## 2020-06-03 DIAGNOSIS — R748 Abnormal levels of other serum enzymes: Secondary | ICD-10-CM

## 2020-06-20 ENCOUNTER — Ambulatory Visit
Admission: RE | Admit: 2020-06-20 | Discharge: 2020-06-20 | Disposition: A | Discharge status: Home / Self Care | Payer: Medicare Other | Source: Ambulatory Visit | Attending: Nurse Practitioner | Admitting: Nurse Practitioner

## 2020-06-20 DIAGNOSIS — R748 Abnormal levels of other serum enzymes: Secondary | ICD-10-CM

## 2020-06-20 DIAGNOSIS — N281 Cyst of kidney, acquired: Secondary | ICD-10-CM | POA: Diagnosis not present

## 2020-06-20 DIAGNOSIS — R12 Heartburn: Secondary | ICD-10-CM

## 2020-06-25 ENCOUNTER — Other Ambulatory Visit: Payer: Self-pay | Admitting: Hematology

## 2020-07-17 ENCOUNTER — Ambulatory Visit: Payer: Medicare Other | Admitting: Nurse Practitioner

## 2020-07-19 ENCOUNTER — Ambulatory Visit: Payer: Medicare Other

## 2020-07-19 ENCOUNTER — Other Ambulatory Visit: Payer: Self-pay

## 2020-07-19 ENCOUNTER — Encounter: Payer: Self-pay | Admitting: Nurse Practitioner

## 2020-07-19 ENCOUNTER — Ambulatory Visit (INDEPENDENT_AMBULATORY_CARE_PROVIDER_SITE_OTHER): Payer: Medicare Other | Admitting: Nurse Practitioner

## 2020-07-19 VITALS — BP 152/74 | HR 70 | Temp 98.2°F | Ht 63.2 in | Wt 128.2 lb

## 2020-07-19 DIAGNOSIS — R935 Abnormal findings on diagnostic imaging of other abdominal regions, including retroperitoneum: Secondary | ICD-10-CM

## 2020-07-19 DIAGNOSIS — I10 Essential (primary) hypertension: Secondary | ICD-10-CM

## 2020-07-19 DIAGNOSIS — R1084 Generalized abdominal pain: Secondary | ICD-10-CM | POA: Diagnosis not present

## 2020-07-19 DIAGNOSIS — E1169 Type 2 diabetes mellitus with other specified complication: Secondary | ICD-10-CM | POA: Diagnosis not present

## 2020-07-19 DIAGNOSIS — R202 Paresthesia of skin: Secondary | ICD-10-CM | POA: Diagnosis not present

## 2020-07-19 MED ORDER — OLMESARTAN MEDOXOMIL 20 MG PO TABS: 20.0000 mg | ORAL_TABLET | Freq: Every day | ORAL | 2 refills | Status: DC

## 2020-07-19 MED ORDER — GLIMEPIRIDE 4 MG PO TABS: ORAL_TABLET | ORAL | 1 refills | Status: DC

## 2020-07-19 NOTE — Patient Instructions (Signed)

## 2020-07-19 NOTE — Addendum Note (Signed)
Addended by: [name] F on: 07/19/2020 05:00 PM   Modules accepted: Orders

## 2020-07-19 NOTE — Progress Notes (Signed)
I,[name] as a Education administrator for [employer], FNP.,have documented all relevant documentation on the behalf of [name], FNP,as directed by  [name], FNP while in the presence of [name], [place]. This visit occurred during the SARS-CoV-2 public health emergency.  Safety protocols were in place, including screening questions prior to the visit, additional usage of staff PPE, and extensive cleaning of exam room while observing appropriate contact time as indicated for disinfecting solutions.  Subjective:     Patient ID: Elizabeth Mcfarland , female    DOB: 1940/07/12 , 80 y.o.   MRN: [id number]   Chief Complaint  Patient presents with  . Diabetes  . Hypertension  . Numbness    HPI  Patient is here for dm check and hypertension.  Wt Readings from Last 3 Encounters: 07/19/20 : 128 lb 3.2 oz (58.2 kg) 05/31/20 : 132 lb 9.6 oz (60.1 kg) 04/18/20 : 135 lb (61.2 kg)     Diabetes She presents for her follow-up diabetic visit. She has type 2 diabetes mellitus. Her disease course has been stable. There are no hypoglycemic associated symptoms. Pertinent negatives for hypoglycemia include no dizziness or headaches. Pertinent negatives for diabetes include no chest pain, no fatigue, no polydipsia, no polyphagia and no polyuria. There are no hypoglycemic complications. Symptoms are stable. There are no diabetic complications. Risk factors for coronary artery disease include hypertension, sedentary lifestyle and dyslipidemia. Current diabetic treatment includes oral agent (dual therapy). She is compliant with treatment all of the time. Her weight is stable. She is following a generally healthy diet. When asked about meal planning, she reported none. She participates in exercise daily (gardening). (Blood sugars was 210 last night, she took her glyburide at bedtime and her blood sugar this morning was 92 after taking a 1/2 tablet) An ACE inhibitor/angiotensin II receptor blocker  is being taken. Eye exam is current.  Hypertension This is a chronic problem. The current episode started more than 1 year ago. The problem is unchanged. The problem is controlled. Pertinent negatives include no chest pain, headaches or palpitations. There are no associated agents to hypertension. Risk factors for coronary artery disease include post-menopausal state. Past treatments include ACE inhibitors. The current treatment provides significant improvement. There are no compliance problems.      Past Medical History:  Diagnosis Date  . Allergy   . Anemia   . Blood transfusion without reported diagnosis   . Breast cancer ([place]) 11/2018   Right breast invasive mammary carcinoma  . Cancer ([place])   . Cataract   . Chronic kidney disease   . Diabetes mellitus without complication ([place])   . Esophageal ulcer with bleeding 05/05/2013   05/05/2013 EGD linear distal esophageal ulcer - cause of hematemesis prior to EGD   . GERD (gastroesophageal reflux disease)   . History of kidney stones   . Hyperlipidemia   . Hypertension   . Kidney cysts   . Personal history of radiation therapy   . Sarcoidosis 1970     Family History  Problem Relation Age of Onset  . Diabetes Sister   . Kidney failure Sister   . Colon polyps Sister   . CAD Maternal Grandmother   . Early death Mother   . Stroke Father   . Stroke Brother   . Colon polyps Brother   . Cancer Brother 50       prostate cancer  . Heart disease Brother   . Emphysema Brother   . Cancer Brother 73  prostate cancer  . Asthma Brother   . Breast cancer Neg Hx   . Colon cancer Neg Hx   . Esophageal cancer Neg Hx   . Rectal cancer Neg Hx   . Stomach cancer Neg Hx      Current Outpatient Medications:  .  acetaminophen (TYLENOL) 500 MG tablet, Take 1,000 mg by mouth every 6 (six) hours as needed for moderate pain or headache. , Disp: , Rfl:  .  amLODipine (NORVASC) 10 MG tablet, TAKE 1 TABLET BY MOUTH EVERY DAY, Disp: 90 tablet,  Rfl: 1 .  anastrozole (ARIMIDEX) 1 MG tablet, TAKE 1 TABLET(1 MG) BY MOUTH DAILY, Disp: 30 tablet, Rfl: 3 .  Ascorbic Acid (VITAMIN C) 1000 MG tablet, Take 1,000 mg by mouth daily., Disp: , Rfl:  .  aspirin EC 81 MG tablet, Take 81 mg by mouth daily., Disp: , Rfl:  .  atorvastatin (LIPITOR) 20 MG tablet, Take 1 tablet (20 mg total) by mouth at bedtime., Disp: 90 tablet, Rfl: 1 .  cholecalciferol (VITAMIN D) 1000 UNITS tablet, Take 1,000 Units by mouth daily. , Disp: , Rfl:  .  glimepiride (AMARYL) 4 MG tablet, Take 1 tab (4mg ) in am and 1/2 tab (2mg ) at bedtime, Disp: 135 tablet, Rfl: 1 .  Multiple Vitamin (MULTIVITAMIN WITH MINERALS) TABS tablet, Take 1 tablet by mouth daily. , Disp: , Rfl:  .  olmesartan (BENICAR) 20 MG tablet, Take 1 tablet (20 mg total) by mouth daily., Disp: 30 tablet, Rfl: 2 .  omeprazole (PRILOSEC) 20 MG capsule, TAKE 1 TABLET(20 MG) BY MOUTH DAILY, Disp: 90 capsule, Rfl: 1 .  ONETOUCH ULTRA test strip, USE AS DIRECTED TO CHECK BLOOD SUGAR TWICE DAILY, Disp: 100 strip, Rfl: 3 .  Polyethyl Glycol-Propyl Glycol (SYSTANE OP), Place 1 drop into both eyes 2 (two) times daily., Disp: , Rfl:  .  polyethylene glycol (MIRALAX / GLYCOLAX) packet, Take 17 g by mouth daily. , Disp: , Rfl:    Allergies  Allergen Reactions  . Codeine Nausea And Vomiting  . Percocet [Oxycodone-Acetaminophen] Nausea And Vomiting     Review of Systems  Constitutional: Negative for fatigue.  Cardiovascular: Negative for chest pain and palpitations.  Endocrine: Negative for polydipsia, polyphagia and polyuria.  Neurological: Negative for dizziness and headaches.  Psychiatric/Behavioral: Negative.      Today's Vitals   07/19/20 0930  BP: (!) 152/74  Pulse: 70  Temp: 98.2 F (36.8 C)  TempSrc: Oral  Weight: 128 lb 3.2 oz (58.2 kg)  Height: 5' 3.2" (1.605 m)  PainSc: 0-No pain   Body mass index is 22.57 kg/m.   Objective:  Physical Exam Vitals reviewed.  Constitutional:      General:  She is not in acute distress.    Appearance: Normal appearance. She is normal weight.  Cardiovascular:     Rate and Rhythm: Normal rate and regular rhythm.     Pulses: Normal pulses.     Heart sounds: Normal heart sounds. No murmur heard.   Pulmonary:     Effort: Pulmonary effort is normal. No respiratory distress.     Breath sounds: Normal breath sounds. No wheezing.  Abdominal:     General: Abdomen is flat. Bowel sounds are normal.     Palpations: Abdomen is soft.  Musculoskeletal:        General: Normal range of motion.     Cervical back: Normal range of motion and neck supple.  Skin:    General: Skin is warm and dry.  Capillary Refill: Capillary refill takes less than 2 seconds.  Neurological:     General: No focal deficit present.     Mental Status: She is alert and oriented to person, place, and time.     Cranial Nerves: No cranial nerve deficit.     Motor: No weakness.  Psychiatric:        Mood and Affect: Mood normal.        Behavior: Behavior normal.        Thought Content: Thought content normal.        Judgment: Judgment normal.         Assessment And Plan:     1. Uncontrolled hypertension . B/P is fairly elevated,  controlled.  . CMP ordered to check renal function.  . The importance of regular exercise and dietary modification was stressed to the patient.  . Stressed importance of losing ten percent of her body weight to help with B/P control.  . She is to call with her blood pressure readings in one week, I have added olmesartan due blood pressure is elevated at home.  - olmesartan (BENICAR) 20 MG tablet; Take 1 tablet (20 mg total) by mouth daily.  Dispense: 30 tablet; Refill: 2  2. Type 2 diabetes mellitus with other specified complication, without long-term current use of insulin (HCC)  Blood sugars have been lower in the mornings, will decrease her evening dose of glimepiride. - glimepiride (AMARYL) 4 MG tablet; Take 1 tab (4mg ) in am and 1/2 tab  (2mg ) at bedtime  Dispense: 135 tablet; Refill: 1  3. Tingling of skin  Left leg continues to have tingling, will send to Neurology for EMG/NCV  Vitamin b12 and thyroid are both normal - Ambulatory referral to Neurology  4. 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 Recommended to do a CT according to pancreatic protocol abdomen CT with contrast.   She has lost 4 lbs since her last visit  - [place]; Future     Patient was given opportunity to ask questions. Patient verbalized understanding of the plan and was able to repeat key elements of the plan. All questions were answered to their satisfaction.  [name], FNP   I, [name], FNP, have reviewed all documentation for this visit. The documentation on 07/19/20 for the exam, diagnosis, procedures, and orders are all accurate and complete.   IF YOU HAVE BEEN REFERRED TO A SPECIALIST, IT MAY TAKE 1-2 WEEKS TO SCHEDULE/PROCESS THE REFERRAL. IF YOU HAVE NOT HEARD FROM US/SPECIALIST IN TWO WEEKS, PLEASE GIVE [place] A CALL AT [phone].   THE PATIENT IS ENCOURAGED TO PRACTICE SOCIAL DISTANCING DUE TO THE COVID-19 PANDEMIC.

## 2020-07-30 ENCOUNTER — Encounter: Payer: Self-pay | Admitting: Nurse Practitioner

## 2020-07-31 ENCOUNTER — Other Ambulatory Visit: Payer: Self-pay | Admitting: Nurse Practitioner

## 2020-08-02 ENCOUNTER — Ambulatory Visit (INDEPENDENT_AMBULATORY_CARE_PROVIDER_SITE_OTHER): Payer: Medicare Other

## 2020-08-02 VITALS — BP 143/78 | HR 76 | Ht 64.0 in | Wt 124.0 lb

## 2020-08-02 DIAGNOSIS — Z23 Encounter for immunization: Secondary | ICD-10-CM | POA: Diagnosis not present

## 2020-08-02 DIAGNOSIS — Z0000 Encounter for general adult medical examination without abnormal findings: Secondary | ICD-10-CM | POA: Diagnosis not present

## 2020-08-02 MED ORDER — BOOSTRIX 5-2.5-18.5 LF-MCG/0.5 IM SUSP: 0.5000 mL | Freq: Once | INTRAMUSCULAR | 0 refills | Status: AC

## 2020-08-02 NOTE — Patient Instructions (Signed)
Elizabeth Mcfarland , Thank you for taking time to come for your Medicare Wellness Visit. I appreciate your ongoing commitment to your health goals. Please review the following plan we discussed and let me know if I can assist you in the future.   Screening recommendations/referrals: Colonoscopy: not required Mammogram: completed 10/10/2019 Bone Density: scheduled 10/10/2020 Recommended yearly ophthalmology/optometry visit for glaucoma screening and checkup Recommended yearly dental visit for hygiene and checkup  Vaccinations: Influenza vaccine: completed 12/20/2019, due 10/22/2020 Pneumococcal vaccine: due Tdap vaccine: sent to pharmacy Shingles vaccine: discussed   Covid-19: 07/30/2020, 01/04/2020, 05/21/2019, 05/03/2019  Advanced directives: Advance directive discussed with you today.   Conditions/risks identified: none  Next appointment: Follow up in one year for your annual wellness visit    Preventive Care 65 Years and Older, Female Preventive care refers to lifestyle choices and visits with your health care provider that can promote health and wellness. What does preventive care include?  A yearly physical exam. This is also called an annual well check.  Dental exams once or twice a year.  Routine eye exams. Ask your health care provider how often you should have your eyes checked.  Personal lifestyle choices, including:  Daily care of your teeth and gums.  Regular physical activity.  Eating a healthy diet.  Avoiding tobacco and drug use.  Limiting alcohol use.  Practicing safe sex.  Taking low-dose aspirin every day.  Taking vitamin and mineral supplements as recommended by your health care provider. What happens during an annual well check? The services and screenings done by your health care provider during your annual well check will depend on your age, overall health, lifestyle risk factors, and family history of disease. Counseling  Your health care provider may ask you  questions about your:  Alcohol use.  Tobacco use.  Drug use.  Emotional well-being.  Home and relationship well-being.  Sexual activity.  Eating habits.  History of falls.  Memory and ability to understand (cognition).  Work and work Statistician.  Reproductive health. Screening  You may have the following tests or measurements:  Height, weight, and BMI.  Blood pressure.  Lipid and cholesterol levels. These may be checked every 5 years, or more frequently if you are over 58 years old.  Skin check.  Lung cancer screening. You may have this screening every year starting at age 30 if you have a 30-pack-year history of smoking and currently smoke or have quit within the past 15 years.  Fecal occult blood test (FOBT) of the stool. You may have this test every year starting at age 7.  Flexible sigmoidoscopy or colonoscopy. You may have a sigmoidoscopy every 5 years or a colonoscopy every 10 years starting at age 35.  Hepatitis C blood test.  Hepatitis B blood test.  Sexually transmitted disease (STD) testing.  Diabetes screening. This is done by checking your blood sugar (glucose) after you have not eaten for a while (fasting). You may have this done every 1-3 years.  Bone density scan. This is done to screen for osteoporosis. You may have this done starting at age 72.  Mammogram. This may be done every 1-2 years. Talk to your health care provider about how often you should have regular mammograms. Talk with your health care provider about your test results, treatment options, and if necessary, the need for more tests. Vaccines  Your health care provider may recommend certain vaccines, such as:  Influenza vaccine. This is recommended every year.  Tetanus, diphtheria, and acellular pertussis (  Tdap, Td) vaccine. You may need a Td booster every 10 years.  Zoster vaccine. You may need this after age 33.  Pneumococcal 13-valent conjugate (PCV13) vaccine. One dose is  recommended after age 31.  Pneumococcal polysaccharide (PPSV23) vaccine. One dose is recommended after age 63. Talk to your health care provider about which screenings and vaccines you need and how often you need them. This information is not intended to replace advice given to you by your health care provider. Make sure you discuss any questions you have with your health care provider. Document Released: 04/06/2015 Document Revised: 11/28/2015 Document Reviewed: 01/09/2015 Elsevier Interactive Patient Education  2017 [place] Prevention in the Home Falls can cause injuries. They can happen to people of all ages. There are many things you can do to make your home safe and to help prevent falls. What can I do on the outside of my home?  Regularly fix the edges of walkways and driveways and fix any cracks.  Remove anything that might make you trip as you walk through a door, such as a raised step or threshold.  Trim any bushes or trees on the path to your home.  Use bright outdoor lighting.  Clear any walking paths of anything that might make someone trip, such as rocks or tools.  Regularly check to see if handrails are loose or broken. Make sure that both sides of any steps have handrails.  Any raised decks and porches should have guardrails on the edges.  Have any leaves, snow, or ice cleared regularly.  Use sand or salt on walking paths during winter.  Clean up any spills in your garage right away. This includes oil or grease spills. What can I do in the bathroom?  Use night lights.  Install grab bars by the toilet and in the tub and shower. Do not use towel bars as grab bars.  Use non-skid mats or decals in the tub or shower.  If you need to sit down in the shower, use a plastic, non-slip stool.  Keep the floor dry. Clean up any water that spills on the floor as soon as it happens.  Remove soap buildup in the tub or shower regularly.  Attach bath mats  securely with double-sided non-slip rug tape.  Do not have throw rugs and other things on the floor that can make you trip. What can I do in the bedroom?  Use night lights.  Make sure that you have a light by your bed that is easy to reach.  Do not use any sheets or blankets that are too big for your bed. They should not hang down onto the floor.  Have a firm chair that has side arms. You can use this for support while you get dressed.  Do not have throw rugs and other things on the floor that can make you trip. What can I do in the kitchen?  Clean up any spills right away.  Avoid walking on wet floors.  Keep items that you use a lot in easy-to-reach places.  If you need to reach something above you, use a strong step stool that has a grab bar.  Keep electrical cords out of the way.  Do not use floor polish or wax that makes floors slippery. If you must use wax, use non-skid floor wax.  Do not have throw rugs and other things on the floor that can make you trip. What can I do with my stairs?  Do  not leave any items on the stairs.  Make sure that there are handrails on both sides of the stairs and use them. Fix handrails that are broken or loose. Make sure that handrails are as long as the stairways.  Check any carpeting to make sure that it is firmly attached to the stairs. Fix any carpet that is loose or worn.  Avoid having throw rugs at the top or bottom of the stairs. If you do have throw rugs, attach them to the floor with carpet tape.  Make sure that you have a light switch at the top of the stairs and the bottom of the stairs. If you do not have them, ask someone to add them for you. What else can I do to help prevent falls?  Wear shoes that:  Do not have high heels.  Have rubber bottoms.  Are comfortable and fit you well.  Are closed at the toe. Do not wear sandals.  If you use a stepladder:  Make sure that it is fully opened. Do not climb a closed  stepladder.  Make sure that both sides of the stepladder are locked into place.  Ask someone to hold it for you, if possible.  Clearly mark and make sure that you can see:  Any grab bars or handrails.  First and last steps.  Where the edge of each step is.  Use tools that help you move around (mobility aids) if they are needed. These include:  Canes.  Walkers.  Scooters.  Crutches.  Turn on the lights when you go into a dark area. Replace any light bulbs as soon as they burn out.  Set up your furniture so you have a clear path. Avoid moving your furniture around.  If any of your floors are uneven, fix them.  If there are any pets around you, be aware of where they are.  Review your medicines with your doctor. Some medicines can make you feel dizzy. This can increase your chance of falling. Ask your doctor what other things that you can do to help prevent falls. This information is not intended to replace advice given to you by your health care provider. Make sure you discuss any questions you have with your health care provider. Document Released: 01/04/2009 Document Revised: 08/16/2015 Document Reviewed: 04/14/2014 Elsevier Interactive Patient Education  2017 [employer].

## 2020-08-02 NOTE — Progress Notes (Signed)
I connected with [name] today by telephone and verified that I am speaking with the correct person using two identifiers. Location patient: home Location provider: work Persons participating in the virtual visit: Adylin Hankey, Glenna Durand LPN.   I discussed the limitations, risks, security and privacy concerns of performing an evaluation and management service by telephone and the availability of in person appointments. I also discussed with the patient that there may be a patient responsible charge related to this service. The patient expressed understanding and verbally consented to this telephonic visit.    Interactive audio and video telecommunications were attempted between this provider and patient, however failed, due to patient having technical difficulties OR patient did not have access to video capability.  We continued and completed visit with audio only.     Vital signs may be patient reported or missing.  Subjective:   Elizabeth Mcfarland is a 80 y.o. female who presents for Medicare Annual (Subsequent) preventive examination.  Review of Systems     Cardiac Risk Factors include: advanced age (>36men, >76 women);diabetes mellitus;hypertension     Objective:    Today's Vitals   08/02/20 1357 08/02/20 1358  BP: (!) 143/78   Pulse: 76   Weight: 124 lb (56.2 kg)   Height: 5\' 4"  (1.626 m)   PainSc:  4    Body mass index is 21.28 kg/m.  Advanced Directives 08/02/2020 04/05/2020 04/02/2020 08/01/2019 07/14/2019 03/10/2019 12/22/2018  Does Patient Have a Medical Advance Directive? No No No No No No No  Type of Advance Directive - - - - - - -  Does patient want to make changes to medical advance directive? - - - - - - -  Would patient like information on creating a medical advance directive? - No - Patient declined Yes (MAU/Ambulatory/Procedural Areas - Information given) - Yes (MAU/Ambulatory/Procedural Areas - Information given) - -    Current Medications  (verified) Outpatient Encounter Medications as of 08/02/2020  Medication Sig  . acetaminophen (TYLENOL) 500 MG tablet Take 1,000 mg by mouth every 6 (six) hours as needed for moderate pain or headache.   Marland Kitchen amLODipine (NORVASC) 10 MG tablet TAKE 1 TABLET BY MOUTH EVERY DAY  . anastrozole (ARIMIDEX) 1 MG tablet TAKE 1 TABLET(1 MG) BY MOUTH DAILY  . Ascorbic Acid (VITAMIN C) 1000 MG tablet Take 1,000 mg by mouth daily.  Marland Kitchen aspirin EC 81 MG tablet Take 81 mg by mouth daily.  Marland Kitchen atorvastatin (LIPITOR) 20 MG tablet Take 1 tablet (20 mg total) by mouth at bedtime.  . cholecalciferol (VITAMIN D) 1000 UNITS tablet Take 1,000 Units by mouth daily.   Marland Kitchen glimepiride (AMARYL) 4 MG tablet Take 1 tab (4mg ) in am and 1/2 tab (2mg ) at bedtime  . Multiple Vitamin (MULTIVITAMIN WITH MINERALS) TABS tablet Take 1 tablet by mouth daily.   Marland Kitchen olmesartan (BENICAR) 20 MG tablet Take 1 tablet (20 mg total) by mouth daily.  Marland Kitchen omeprazole (PRILOSEC) 20 MG capsule TAKE 1 TABLET(20 MG) BY MOUTH DAILY  . ONETOUCH ULTRA test strip USE AS DIRECTED TO CHECK BLOOD SUGAR TWICE DAILY  . Polyethyl Glycol-Propyl Glycol (SYSTANE OP) Place 1 drop into both eyes 2 (two) times daily.  . polyethylene glycol (MIRALAX / GLYCOLAX) packet Take 17 g by mouth daily.   . Tdap (BOOSTRIX) 5-2.5-18.5 LF-MCG/0.5 injection Inject 0.5 mLs into the muscle once for 1 dose.   No facility-administered encounter medications on file as of 08/02/2020.    Allergies (verified) Codeine and Percocet [oxycodone-acetaminophen]  History: Past Medical History:  Diagnosis Date  . Allergy   . Anemia   . Blood transfusion without reported diagnosis   . Breast cancer ([place]) 11/2018   Right breast invasive mammary carcinoma  . Cancer ([place])   . Cataract   . Chronic kidney disease   . Diabetes mellitus without complication ([place])   . Esophageal ulcer with bleeding 05/05/2013   05/05/2013 EGD linear distal esophageal ulcer - cause of hematemesis prior to EGD   .  GERD (gastroesophageal reflux disease)   . History of kidney stones   . Hyperlipidemia   . Hypertension   . Kidney cysts   . Personal history of radiation therapy   . Sarcoidosis 1970   Past Surgical History:  Procedure Laterality Date  . ABDOMINAL HYSTERECTOMY    . BREAST EXCISIONAL BIOPSY Left 11/2018   Fibroadenoma  . BREAST LUMPECTOMY    . BREAST LUMPECTOMY WITH RADIOACTIVE SEED AND SENTINEL LYMPH NODE BIOPSY Bilateral 11/25/2018   Procedure: BILATERAL BREAST LUMPECTOMIES  WITH BILATERAL  RADIOACTIVE SEEDS AND RIGHT SENTINEL LYMPH NODE BIOPSY;  Surgeon: [name], MD;  Location: [place] [facility];  Service: General;  Laterality: Bilateral;  . CATARACT EXTRACTION W/ INTRAOCULAR LENS IMPLANT    . COLONOSCOPY    . COLONOSCOPY, ESOPHAGOGASTRODUODENOSCOPY (EGD) AND ESOPHAGEAL DILATION N/A 05/05/2013   Procedure: COLONOSCOPY, ESOPHAGOGASTRODUODENOSCOPY (EGD) AND ESOPHAGEAL DILATION (ED);  Surgeon: Gatha Mayer, MD;  Location: [facility];  Service: Endoscopy;  Laterality: N/A;  . LITHOTRIPSY    . POLYPECTOMY    . UPPER GASTROINTESTINAL ENDOSCOPY     Family History  Problem Relation Age of Onset  . Diabetes Sister   . Kidney failure Sister   . Colon polyps Sister   . CAD Maternal Grandmother   . Early death Mother   . Stroke Father   . Stroke Brother   . Colon polyps Brother   . Cancer Brother 22       prostate cancer  . Heart disease Brother   . Emphysema Brother   . Cancer Brother 63       prostate cancer  . Asthma Brother   . Breast cancer Neg Hx   . Colon cancer Neg Hx   . Esophageal cancer Neg Hx   . Rectal cancer Neg Hx   . Stomach cancer Neg Hx    Social History   Socioeconomic History  . Marital status: Single    Spouse name: Not on file  . Number of children: 1  . Years of education: Not on file  . Highest education level: Not on file  Occupational History  . Occupation: Physiological scientist: SELF-EMPLOYED  . Occupation: retired  Tobacco Use  . Smoking  status: Never Smoker  . Smokeless tobacco: Never Used  Vaping Use  . Vaping Use: Never used  Substance and Sexual Activity  . Alcohol use: No  . Drug use: No  . Sexual activity: Not Currently  Other Topics Concern  . Not on file  Social History Narrative  . Not on file   Social Determinants of Health   Financial Resource Strain: Low Risk   . Difficulty of Paying Living Expenses: Not hard at all  Food Insecurity: No Food Insecurity  . Worried About Charity fundraiser in the Last Year: Never true  . Ran Out of Food in the Last Year: Never true  Transportation Needs: No Transportation Needs  . Lack of Transportation (Medical): No  . Lack of Transportation (Non-Medical):  No  Physical Activity: Inactive  . Days of Exercise per Week: 0 days  . Minutes of Exercise per Session: 0 min  Stress: No Stress Concern Present  . Feeling of Stress : Not at all  Social Connections: Not on file    Tobacco Counseling Counseling given: Not Answered   Clinical Intake:  Pre-visit preparation completed: Yes  Pain : 0-10 Pain Score: 4  Pain Type: Acute pain Pain Location: Knee Pain Orientation: Left Pain Descriptors / Indicators: Aching Pain Onset: 1 to 4 weeks ago Pain Frequency: Intermittent     Nutritional Status: BMI of 19-24  Normal Nutritional Risks: None Diabetes: Yes  How often do you need to have someone help you when you read instructions, pamphlets, or other written materials from your doctor or pharmacy?: 1 - Never What is the last grade level you completed in school?: GED  Diabetic? Yes Nutrition Risk Assessment:  Has the patient had any N/V/D within the last 2 months?  No  Does the patient have any non-healing wounds?  No  Has the patient had any unintentional weight loss or weight gain?  Yes   Diabetes:  Is the patient diabetic?  Yes  If diabetic, was a CBG obtained today?  No  Did the patient bring in their glucometer from home?  No  How often do you  monitor your CBG's? 3-4 daily.   Financial Strains and Diabetes Management:  Are you having any financial strains with the device, your supplies or your medication? No .  Does the patient want to be seen by Chronic Care Management for management of their diabetes?  No  Would the patient like to be referred to a Nutritionist or for Diabetic Management?  No   Diabetic Exams:  Diabetic Eye Exam: Completed 09/07/2019 Diabetic Foot Exam: Completed 04/18/2020   Interpreter Needed?: No  Information entered by :: NAllen LPN   Activities of Daily Living In your present state of health, do you have any difficulty performing the following activities: 08/02/2020 07/19/2020  Hearing? N N  Vision? N N  Difficulty concentrating or making decisions? N Y  Walking or climbing stairs? N N  Dressing or bathing? N N  Doing errands, shopping? N N  Preparing Food and eating ? N -  Using the Toilet? N -  In the past six months, have you accidently leaked urine? N -  Do you have problems with loss of bowel control? N -  Managing your Medications? N -  Managing your Finances? N -  Housekeeping or managing your Housekeeping? N -  Some recent data might be hidden    Patient Care Team: [name], [place] as PCP - General (General Practice) [name], RN as Oncology Nurse Navigator [name], RN as Oncology Nurse Navigator [name], MD as Consulting Physician (Hematology) [name], MD as Consulting Physician (General Surgery) [name], MD as Consulting Physician (Radiation Oncology) Alla Feeling, NP as Nurse Practitioner (Nurse Practitioner)  Indicate any recent Medical Services you may have received from other than [facility] providers in the past year (date may be approximate).     Assessment:   This is a routine wellness examination for [facility].  Hearing/Vision screen  Hearing Screening   125Hz  250Hz  500Hz  1000Hz  2000Hz  3000Hz  4000Hz  6000Hz  8000Hz   Right ear:            Left ear:           Vision Screening Comments: Regular eye exams, Dr.  [place]  Dietary issues and exercise activities discussed: Current Exercise Habits: The patient does not participate in regular exercise at present  Goals Addressed            This Visit's Progress   . Patient Stated       08/02/2020, find out what is going on with pancreas and stand up straighter      Depression Screen PHQ 2/9 Scores 08/02/2020 07/19/2020 07/14/2019 04/07/2019 01/05/2019 07/08/2018 01/13/2018  PHQ - 2 Score 0 0 0 0 0 0 0  PHQ- 9 Score - - 0 - - 0 -    Fall Risk Fall Risk  08/02/2020 07/19/2020 07/14/2019 04/07/2019 01/05/2019  Falls in the past year? 1 0 0 0 0  Comment tripped in garden - - - -  Number falls in past yr: 0 - - - -  Injury with Fall? 1 - - - -  Comment hurt knee - - - -  Risk for fall due to : Medication side effect - Medication side effect - -  Follow up Falls evaluation completed;Education provided;Falls prevention discussed - Falls evaluation completed;Education provided;Falls prevention discussed - -    FALL RISK PREVENTION PERTAINING TO THE HOME:  Any stairs in or around the home? Yes  If so, are there any without handrails? Yes  Home free of loose throw rugs in walkways, pet beds, electrical cords, etc? Yes  Adequate lighting in your home to reduce risk of falls? Yes   ASSISTIVE DEVICES UTILIZED TO PREVENT FALLS:  Life alert? No  Use of a cane, walker or w/c? No  Grab bars in the bathroom? No  Shower chair or bench in shower? No  Elevated toilet seat or a handicapped toilet? Yes   TIMED UP AND GO:  Was the test performed? No . .   Cognitive Function:     6CIT Screen 08/02/2020 07/14/2019 07/08/2018  What Year? 0 points 0 points 0 points  What month? 0 points 0 points 0 points  What time? 0 points 0 points 0 points  Count back from 20 2 points 0 points 0 points  Months in reverse 0 points 0 points 0 points  Repeat phrase 0 points 2 points 0 points  Total Score 2  2 0    Immunizations Immunization History  Administered Date(s) Administered  . Fluad Quad(high Dose 65+) 12/20/2019  . Influenza, High Dose Seasonal PF 01/13/2018, 01/05/2019  . PFIZER(Purple Top)SARS-COV-2 Vaccination 05/03/2019, 05/21/2019, 01/04/2020, 07/30/2020  . Pneumococcal Conjugate-13 04/11/2019  . Td 03/24/2010    TDAP status: Due, Education has been provided regarding the importance of this vaccine. Advised may receive this vaccine at local pharmacy or Health Dept. Aware to provide a copy of the vaccination record if obtained from local pharmacy or Health Dept. Verbalized acceptance and understanding.  Flu Vaccine status: Up to date  Pneumococcal vaccine status: Due, Education has been provided regarding the importance of this vaccine. Advised may receive this vaccine at local pharmacy or Health Dept. Aware to provide a copy of the vaccination record if obtained from local pharmacy or Health Dept. Verbalized acceptance and understanding.  Covid-19 vaccine status: Completed vaccines  Qualifies for Shingles Vaccine? Yes   Zostavax completed No   Shingrix Completed?: No.    Education has been provided regarding the importance of this vaccine. Patient has been advised to call insurance company to determine out of pocket expense if they have not yet received this vaccine. Advised may also receive vaccine at local pharmacy or  Health Dept. Verbalized acceptance and understanding.  Screening Tests Health Maintenance  Topic Date Due  . TETANUS/TDAP  03/24/2020  . PNA vac Low Risk Adult (2 of 2 - PPSV23) 04/10/2020  . OPHTHALMOLOGY EXAM  09/06/2020  . HEMOGLOBIN A1C  10/16/2020  . INFLUENZA VACCINE  10/22/2020  . FOOT EXAM  04/18/2021  . DEXA SCAN  Completed  . COVID-19 Vaccine  Completed  . Hepatitis C Screening  Completed  . HPV VACCINES  Aged Out    Health Maintenance  Health Maintenance Due  Topic Date Due  . TETANUS/TDAP  03/24/2020  . PNA vac Low Risk Adult (2 of  2 - PPSV23) 04/10/2020    Colorectal cancer screening: No longer required.   Mammogram status: Completed 10/10/2019. Repeat every year  Bone Density status: scheduled 10/10/2020  Lung Cancer Screening: (Low Dose CT Chest recommended if Age 46-80 years, 30 pack-year currently smoking OR have quit w/in 15years.) does not qualify.   Lung Cancer Screening Referral: no  Additional Screening:  Hepatitis C Screening: does qualify; Completed 10/13/2019  Vision Screening: Recommended annual ophthalmology exams for early detection of glaucoma and other disorders of the eye. Is the patient up to date with their annual eye exam?  Yes  Who is the provider or what is the name of the office in which the patient attends annual eye exams? Dr. [place] If pt is not established with a provider, would they like to be referred to a provider to establish care? No .   Dental Screening: Recommended annual dental exams for proper oral hygiene  Community Resource Referral / Chronic Care Management: CRR required this visit?  No   CCM required this visit?  No      Plan:     I have personally reviewed and noted the following in the patient's chart:   . Medical and social history . Use of alcohol, tobacco or illicit drugs  . Current medications and supplements including opioid prescriptions.  . Functional ability and status . Nutritional status . Physical activity . Advanced directives . List of other physicians . Hospitalizations, surgeries, and ER visits in previous 12 months . Vitals . Screenings to include cognitive, depression, and falls . Referrals and appointments  In addition, I have reviewed and discussed with patient certain preventive protocols, quality metrics, and best practice recommendations. A written personalized care plan for preventive services as well as general preventive health recommendations were provided to patient.     Kellie Simmering, LPN   [id number]   Nurse Notes:

## 2020-08-09 IMAGING — MG MM PLC BREAST LOC DEV 1ST LESION INC*R*
8 of 9 series · 8 of 9 positions shown · non-contrast
Comparison: Previous exam(s).

CLINICAL DATA: Biopsy proven invasive mammary carcinoma in the
right breast and a fibroadenoma with dystrophic calcifications in
the left breast. Patient request excision of the fibroadenoma in the
left breast.

EXAM:
MAMMOGRAPHIC GUIDED RADIOACTIVE SEED LOCALIZATION OF THE BILATERAL
BREAST

[R CC (1 of 5)]
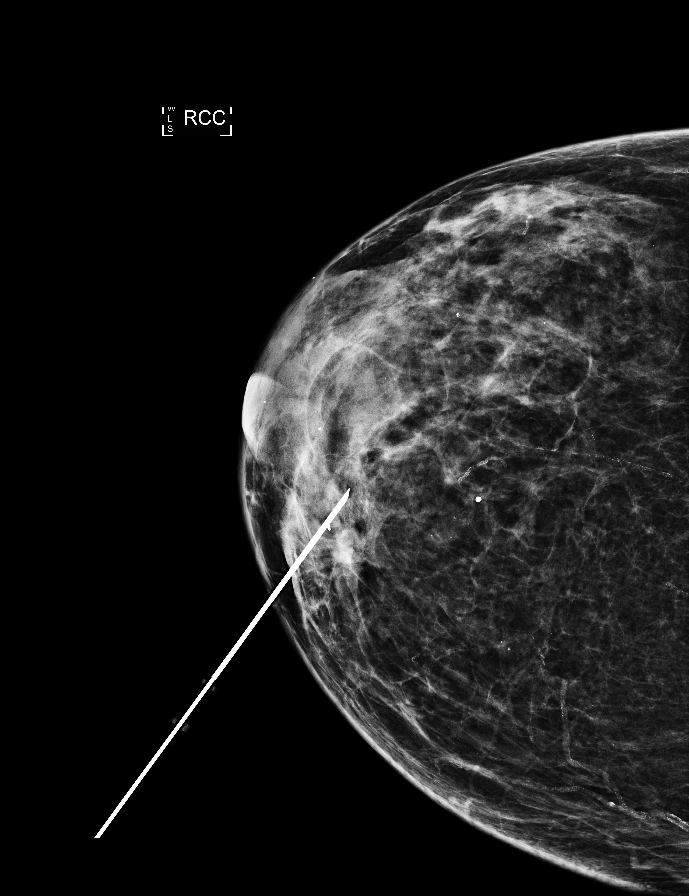

[R CC (2 of 5)]
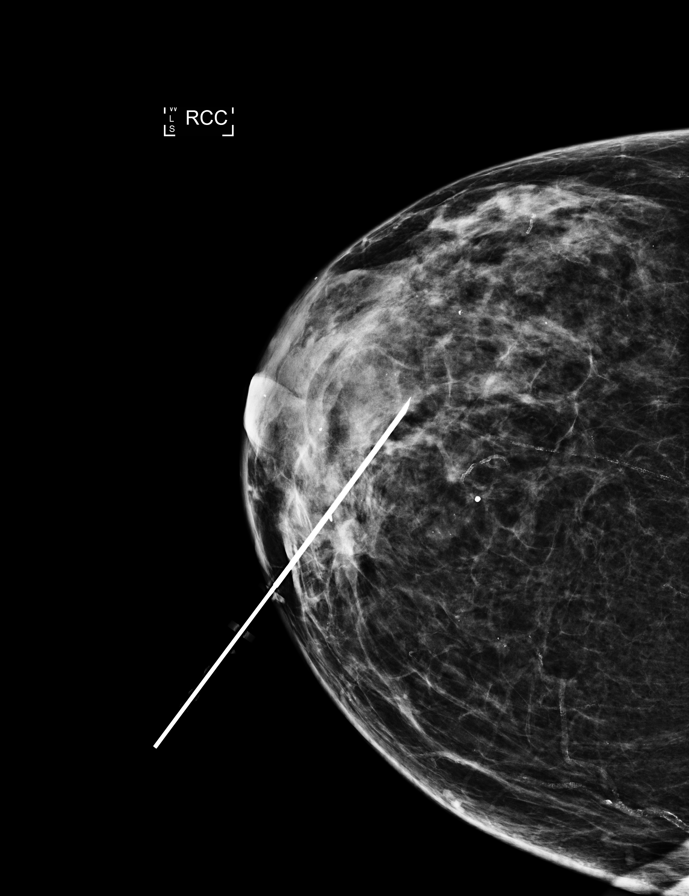

[R ML (1 of 3)]
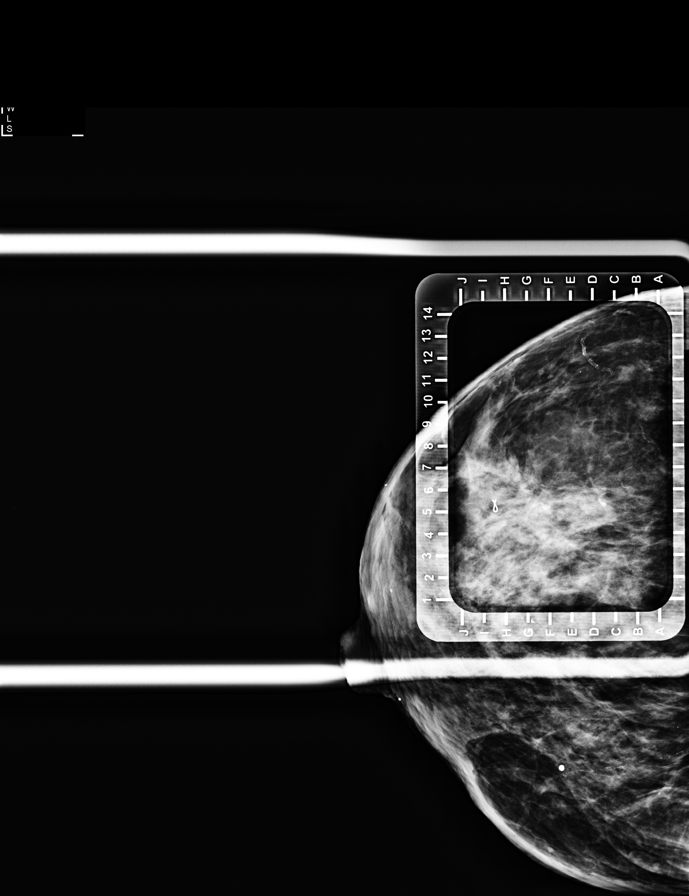

[R ML (2 of 3)]
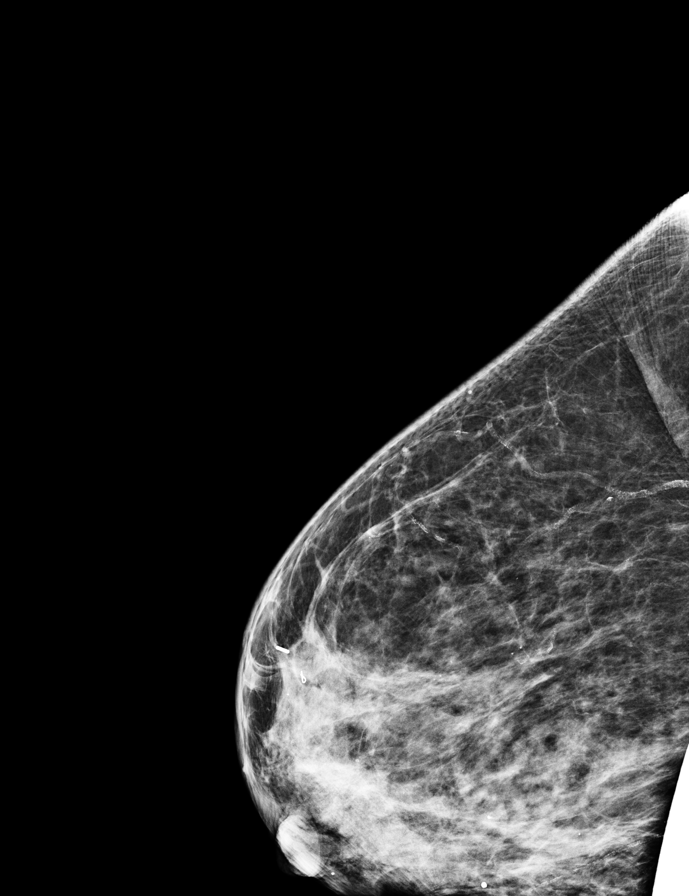

[R CC (3 of 5)]
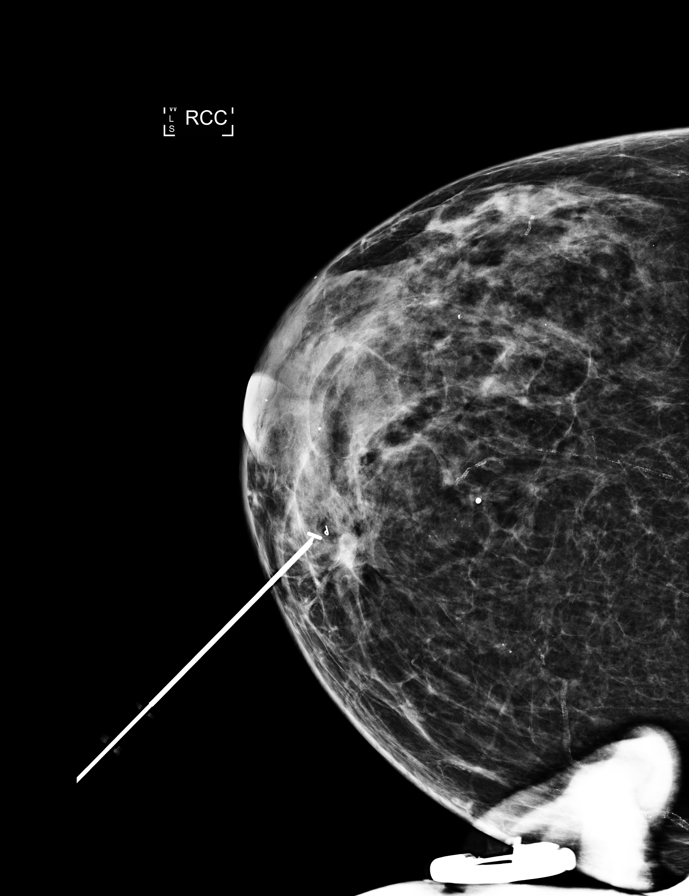

[R CC (4 of 5)]
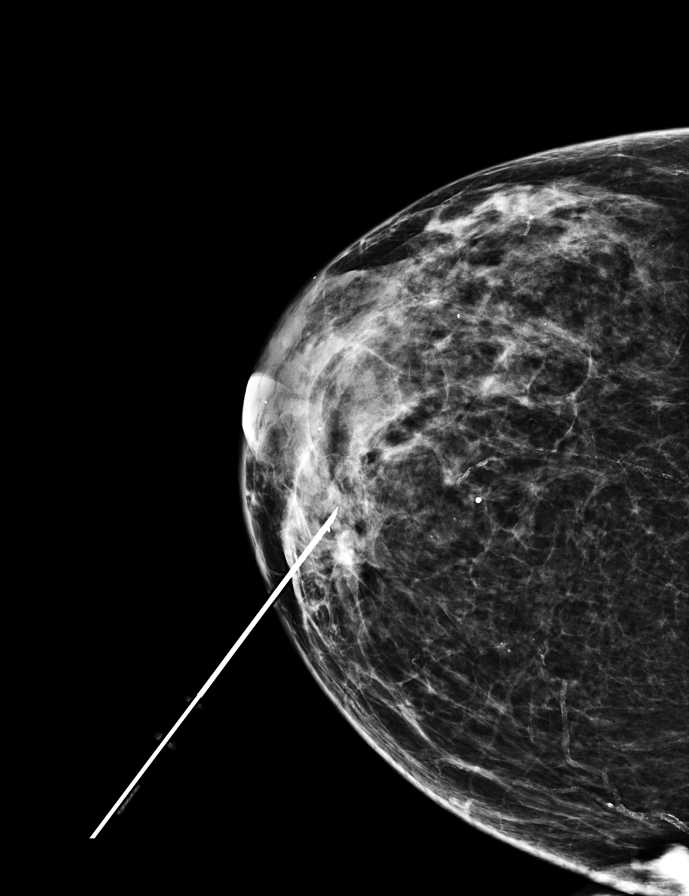

[R CC (5 of 5)]
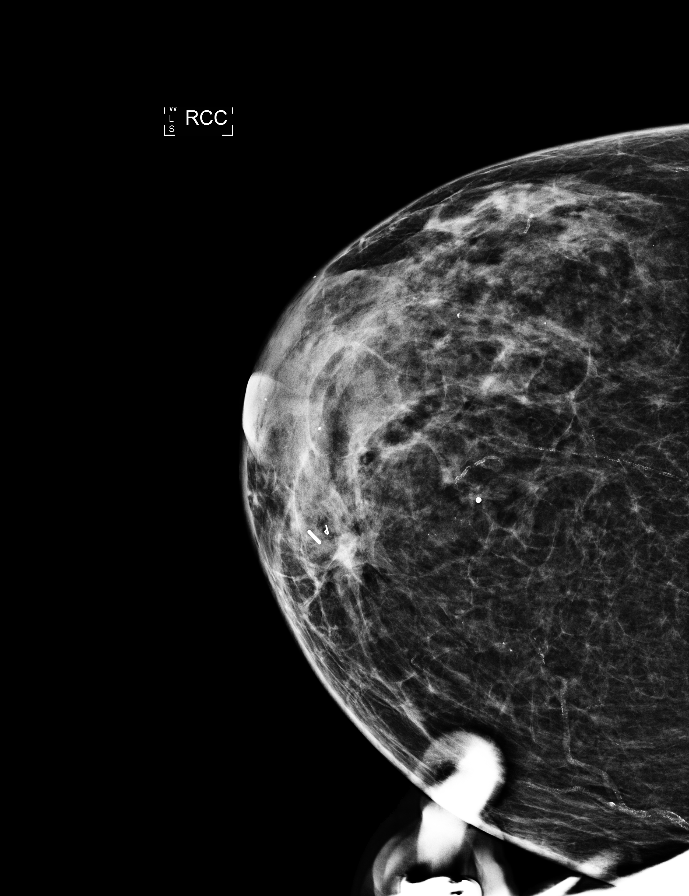

[R ML (3 of 3)]
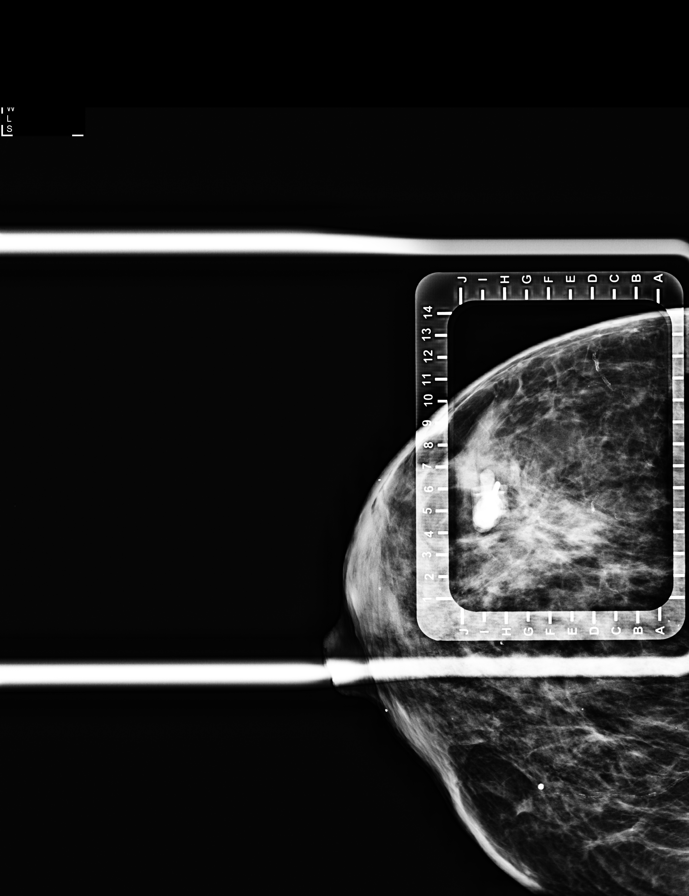

[8 of 9 positions shown; findings below may reference images not displayed]



The usual time-out protocol was performed immediately prior to the
procedure.

Using mammographic guidance, sterile technique, 1% lidocaine and an
G-M01 radioactive seed, the ribbon shaped clip in the UPPER-INNER
QUADRANT OF THE RIGHT BREAST was localized using a medial to lateral
approach. The follow-up mammogram images confirm the seed in the
expected location and were marked for Dr. Ana-Maria.

Follow-up survey of the patient confirms presence of the radioactive
seed.

Order number of G-M01 seed:  [id number].

Total activity:  0.245 millicuries reference Date: 11/22/2018

The patient tolerated the procedure well and was released from the
[REDACTED]. She was given instructions regarding seed removal.

Patient presents for radioactive seed localization prior to surgery.
I met with the patient and we discussed the procedure of seed
localization including benefits and alternatives. We discussed the
high likelihood of a successful procedure. We discussed the risks of
the procedure including infection, bleeding, tissue injury and
further surgery. We discussed the low dose of radioactivity involved
in the procedure. Informed, written consent was given.

The usual time-out protocol was performed immediately prior to the
procedure.

Using mammographic guidance, sterile technique, 1% lidocaine and an
G-M01 radioactive seed, the ribbon shaped clip in the UPPER INNER
QUADRANT OF THE LEFT BREAST was localized using a medial to lateral
approach. The follow-up mammogram images confirm the seed in the
expected location and were marked for Dr. Ana-Maria.

Follow-up survey of the patient confirms presence of the radioactive
seed.

Order number of G-M01 seed:  [id number].

Total activity:  0.245 millicuries reference Date: 11/22/2018

The patient tolerated the procedure well and was released from the
[REDACTED]. She was given instructions regarding seed removal.
IMPRESSION: Radioactive seed localization bilateral breast. No apparent
complications.

## 2020-08-10 ENCOUNTER — Other Ambulatory Visit: Payer: Self-pay | Admitting: Nurse Practitioner

## 2020-08-10 IMAGING — MG MM BREAST SURGICAL SPECIMEN
1 series · 1 of 1 positions shown · non-contrast
Comparison: Previous exam(s).

CLINICAL DATA: Evaluate surgical specimen following excision of
fibroadenoma with dystrophic calcifications in the LEFT breast.

EXAM:
SPECIMEN RADIOGRAPH OF THE LEFT BREAST

[L]
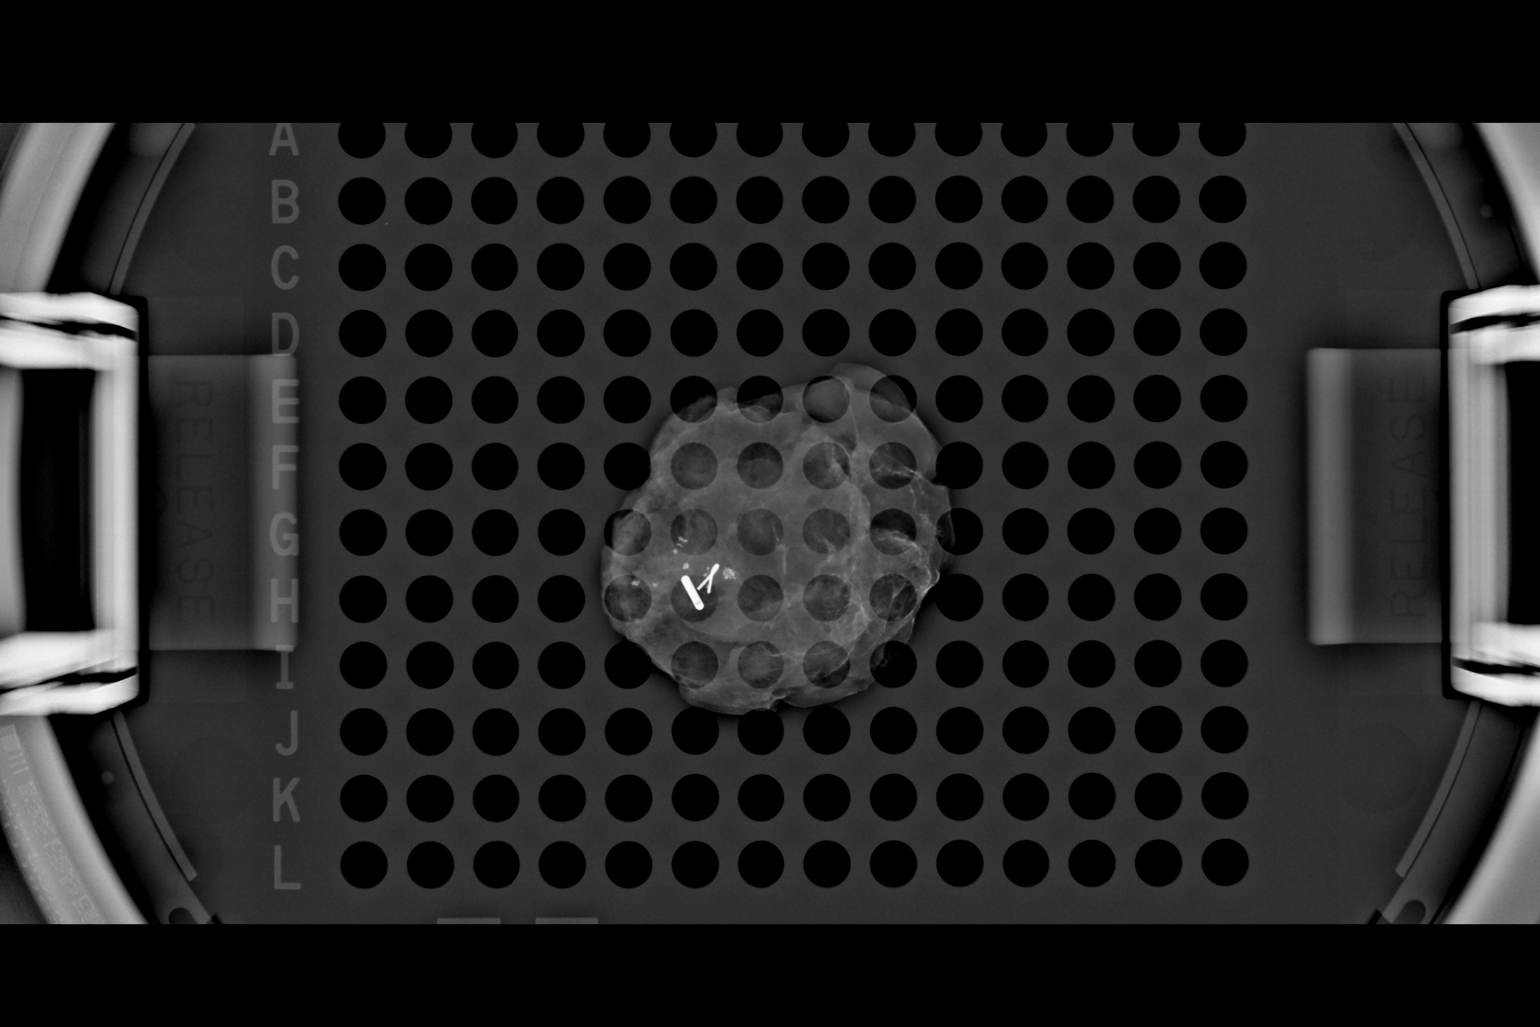

[1 of 1 positions shown; findings below may reference images not displayed]

FINDINGS: Status post excision of the LEFT breast. The radioactive seed and
biopsy marker clip are present, completely intact, and were marked
for pathology.
IMPRESSION: Specimen radiograph of the LEFT breast.

## 2020-08-13 ENCOUNTER — Ambulatory Visit
Admission: RE | Admit: 2020-08-13 | Discharge: 2020-08-13 | Disposition: A | Discharge status: Home / Self Care | Payer: Medicare Other | Source: Ambulatory Visit | Attending: Nurse Practitioner | Admitting: Nurse Practitioner

## 2020-08-13 ENCOUNTER — Other Ambulatory Visit: Payer: Self-pay

## 2020-08-13 DIAGNOSIS — R935 Abnormal findings on diagnostic imaging of other abdominal regions, including retroperitoneum: Secondary | ICD-10-CM

## 2020-08-13 DIAGNOSIS — R1084 Generalized abdominal pain: Secondary | ICD-10-CM

## 2020-08-13 DIAGNOSIS — N202 Calculus of kidney with calculus of ureter: Secondary | ICD-10-CM | POA: Diagnosis not present

## 2020-08-13 DIAGNOSIS — R109 Unspecified abdominal pain: Secondary | ICD-10-CM | POA: Diagnosis not present

## 2020-08-13 MED ORDER — IOPAMIDOL (ISOVUE-300) INJECTION 61%
100.0000 mL | Freq: Once | INTRAVENOUS | Status: AC | PRN
  Administered 2020-08-13: 100 mL via INTRAVENOUS

## 2020-08-17 ENCOUNTER — Other Ambulatory Visit: Payer: Self-pay | Admitting: Nurse Practitioner

## 2020-08-17 DIAGNOSIS — I700 Atherosclerosis of aorta: Secondary | ICD-10-CM

## 2020-09-04 ENCOUNTER — Ambulatory Visit: Payer: Medicare Other | Admitting: Nurse Practitioner

## 2020-09-05 ENCOUNTER — Other Ambulatory Visit: Payer: Self-pay | Admitting: Hematology

## 2020-09-11 ENCOUNTER — Other Ambulatory Visit: Payer: Self-pay

## 2020-09-11 ENCOUNTER — Encounter: Payer: Self-pay | Admitting: Nurse Practitioner

## 2020-09-11 ENCOUNTER — Ambulatory Visit (INDEPENDENT_AMBULATORY_CARE_PROVIDER_SITE_OTHER): Payer: Medicare Other | Admitting: Nurse Practitioner

## 2020-09-11 VITALS — BP 142/78 | HR 68 | Temp 98.9°F | Ht 64.0 in | Wt 127.4 lb

## 2020-09-11 DIAGNOSIS — I739 Peripheral vascular disease, unspecified: Secondary | ICD-10-CM | POA: Diagnosis not present

## 2020-09-11 DIAGNOSIS — E1169 Type 2 diabetes mellitus with other specified complication: Secondary | ICD-10-CM | POA: Diagnosis not present

## 2020-09-11 DIAGNOSIS — Z23 Encounter for immunization: Secondary | ICD-10-CM | POA: Diagnosis not present

## 2020-09-11 DIAGNOSIS — I10 Essential (primary) hypertension: Secondary | ICD-10-CM | POA: Diagnosis not present

## 2020-09-11 DIAGNOSIS — M79642 Pain in left hand: Secondary | ICD-10-CM | POA: Diagnosis not present

## 2020-09-11 DIAGNOSIS — E782 Mixed hyperlipidemia: Secondary | ICD-10-CM

## 2020-09-11 MED ORDER — TETANUS-DIPHTH-ACELL PERTUSSIS 5-2.5-18.5 LF-MCG/0.5 IM SUSP: 0.5000 mL | Freq: Once | INTRAMUSCULAR | 0 refills | Status: AC

## 2020-09-11 MED ORDER — OLMESARTAN MEDOXOMIL 40 MG PO TABS: 40.0000 mg | ORAL_TABLET | Freq: Every day | ORAL | 1 refills | Status: DC

## 2020-09-11 MED ORDER — SHINGRIX 50 MCG/0.5ML IM SUSR: 0.5000 mL | Freq: Once | INTRAMUSCULAR | 0 refills | Status: DC

## 2020-09-11 NOTE — Progress Notes (Signed)
I,[name] as a Education administrator for [employer], FNP.,have documented all relevant documentation on the behalf of [name], FNP,as directed by  [name], FNP while in the presence of [name], [place].  This visit occurred during the SARS-CoV-2 public health emergency.  Safety protocols were in place, including screening questions prior to the visit, additional usage of staff PPE, and extensive cleaning of exam room while observing appropriate contact time as indicated for disinfecting solutions.  Subjective:     Patient ID: Elizabeth Mcfarland , female    DOB: 1940-07-23 , 80 y.o.   MRN: [id number]   Chief Complaint  Patient presents with   Diabetes    HPI  Patient is here for dm check. She is also due for her pneumonia vaccine, pt says she forgot about it being called into the drug store a month ago, & if it could be called back in. She also has concerns about the tests ran for her pancreas.   She reports her back feels better since her last visit after lifting many bags of mulch.    Blood pressure has been averaging 147-184/80-104  Wt Readings from Last 3 Encounters: 09/11/20 : 127 lb 6.4 oz (57.8 kg) 08/02/20 : 124 lb (56.2 kg) 07/19/20 : 128 lb 3.2 oz (58.2 kg)       Diabetes She presents for her follow-up diabetic visit. She has type 2 diabetes mellitus. Her disease course has been stable. There are no hypoglycemic associated symptoms. Pertinent negatives for hypoglycemia include no dizziness or headaches. Pertinent negatives for diabetes include no chest pain, no fatigue, no polydipsia, no polyphagia and no polyuria. There are no hypoglycemic complications. Symptoms are stable. There are no diabetic complications. Risk factors for coronary artery disease include hypertension, sedentary lifestyle and dyslipidemia. Current diabetic treatment includes oral agent (dual therapy). She is compliant with treatment all of the time. Her weight is stable. She is  following a generally healthy diet. When asked about meal planning, she reported none. She participates in exercise daily (gardening). (Blood sugars was 210 last night, she took her glyburide at bedtime and her blood sugar this morning was 92 after taking a 1/2 tablet) An ACE inhibitor/angiotensin II receptor blocker is being taken. Eye exam is current.  Hypertension This is a chronic problem. The current episode started more than 1 year ago. The problem is unchanged. The problem is controlled. Pertinent negatives include no chest pain, headaches or palpitations. There are no associated agents to hypertension. Risk factors for coronary artery disease include post-menopausal state. Past treatments include ACE inhibitors. The current treatment provides significant improvement. There are no compliance problems.     Past Medical History:  Diagnosis Date   Allergy    Anemia    Blood transfusion without reported diagnosis    Breast cancer ([place]) 11/2018   Right breast invasive mammary carcinoma   Cancer ([place])    Cataract    Chronic kidney disease    Diabetes mellitus without complication ([place])    Esophageal ulcer with bleeding 05/05/2013   05/05/2013 EGD linear distal esophageal ulcer - cause of hematemesis prior to EGD    GERD (gastroesophageal reflux disease)    History of kidney stones    Hyperlipidemia    Hypertension    Kidney cysts    Personal history of radiation therapy    Sarcoidosis 1970     Family History  Problem Relation Age of Onset   Diabetes Sister    Kidney failure Sister  Colon polyps Sister    CAD Maternal Grandmother    Early death Mother    Stroke Father    Stroke Brother    Colon polyps Brother    Cancer Brother 37       prostate cancer   Heart disease Brother    Emphysema Brother    Cancer Brother 10       prostate cancer   Asthma Brother    Breast cancer Neg Hx    Colon cancer Neg Hx    Esophageal cancer Neg Hx    Rectal cancer Neg Hx    Stomach cancer  Neg Hx      Current Outpatient Medications:    acetaminophen (TYLENOL) 500 MG tablet, Take 1,000 mg by mouth every 6 (six) hours as needed for moderate pain or headache. , Disp: , Rfl:    amLODipine (NORVASC) 10 MG tablet, TAKE 1 TABLET BY MOUTH EVERY DAY, Disp: 90 tablet, Rfl: 1   anastrozole (ARIMIDEX) 1 MG tablet, TAKE 1 TABLET(1 MG) BY MOUTH DAILY, Disp: 30 tablet, Rfl: 3   Ascorbic Acid (VITAMIN C) 1000 MG tablet, Take 1,000 mg by mouth daily., Disp: , Rfl:    aspirin EC 81 MG tablet, Take 81 mg by mouth daily., Disp: , Rfl:    atorvastatin (LIPITOR) 20 MG tablet, TAKE 1 TABLET(20 MG) BY MOUTH AT BEDTIME, Disp: 90 tablet, Rfl: 1   cholecalciferol (VITAMIN D) 1000 UNITS tablet, Take 1,000 Units by mouth daily. , Disp: , Rfl:    glimepiride (AMARYL) 4 MG tablet, Take 1 tab (44m) in am and 1/2 tab (272m at bedtime, Disp: 135 tablet, Rfl: 1   Multiple Vitamin (MULTIVITAMIN WITH MINERALS) TABS tablet, Take 1 tablet by mouth daily. , Disp: , Rfl:    omeprazole (PRILOSEC) 20 MG capsule, TAKE 1 TABLET(20 MG) BY MOUTH DAILY, Disp: 90 capsule, Rfl: 1   ONETOUCH ULTRA test strip, USE AS DIRECTED TO CHECK BLOOD SUGAR TWICE DAILY, Disp: 100 strip, Rfl: 3   Polyethyl Glycol-Propyl Glycol (SYSTANE OP), Place 1 drop into both eyes 2 (two) times daily., Disp: , Rfl:    polyethylene glycol (MIRALAX / GLYCOLAX) packet, Take 17 g by mouth daily. , Disp: , Rfl:    olmesartan (BENICAR) 40 MG tablet, Take 1 tablet (40 mg total) by mouth daily., Disp: 90 tablet, Rfl: 1   Allergies  Allergen Reactions   Codeine Nausea And Vomiting   Percocet [Oxycodone-Acetaminophen] Nausea And Vomiting     Review of Systems  Constitutional: Negative.  Negative for fatigue.  Respiratory: Negative.    Cardiovascular: Negative.  Negative for chest pain, palpitations and leg swelling.  Endocrine: Negative for polydipsia, polyphagia and polyuria.  Neurological: Negative.  Negative for dizziness and headaches.   Psychiatric/Behavioral: Negative.      Today's Vitals   09/11/20 0823  BP: (!) 142/78  Pulse: 68  Temp: 98.9 F (37.2 C)  SpO2: 99%  Weight: 127 lb 6.4 oz (57.8 kg)  Height: _0  (1.626 m)   Body mass index is 21.87 kg/m.  Wt Readings from Last 3 Encounters:  09/11/20 127 lb 6.4 oz (57.8 kg)  08/02/20 124 lb (56.2 kg)  07/19/20 128 lb 3.2 oz (58.2 kg)    Objective:  Physical Exam Vitals reviewed.  Constitutional:      General: She is not in acute distress.    Appearance: Normal appearance. She is normal weight.  Cardiovascular:     Rate and Rhythm: Normal rate and regular rhythm.  Pulses: Normal pulses.     Heart sounds: Normal heart sounds. No murmur heard. Pulmonary:     Effort: Pulmonary effort is normal. No respiratory distress.     Breath sounds: Normal breath sounds. No wheezing.  Abdominal:     General: Abdomen is flat. Bowel sounds are normal.     Palpations: Abdomen is soft.  Musculoskeletal:        General: Normal range of motion.     Cervical back: Normal range of motion and neck supple.  Skin:    General: Skin is warm and dry.     Capillary Refill: Capillary refill takes less than 2 seconds.  Neurological:     General: No focal deficit present.     Mental Status: She is alert and oriented to person, place, and time.     Cranial Nerves: No cranial nerve deficit.     Motor: No weakness.  Psychiatric:        Mood and Affect: Mood normal.        Behavior: Behavior normal.        Thought Content: Thought content normal.        Judgment: Judgment normal.        Assessment And Plan:     1. Type 2 diabetes mellitus with other specified complication, without long-term current use of insulin (HCC) Chronic, slightly elevated in January Continue with current medications Continue with chair exercising - Hemoglobin A1c  2. PAD (peripheral artery disease) (HCC) Chronic  3. Uncontrolled hypertension Systolic blood pressure is elevated and has been  ranging 147-184/80-104 She is to return in 2 weeks for blood pressure check I have increased her dose of olmesartan to 40 mg daily she will take 2 tabs (22m) daily until gone. - olmesartan (BENICAR) 40 MG tablet; Take 1 tablet (40 mg total) by mouth daily.  Dispense: 90 tablet; Refill: 1 - CMP14+EGFR  4. Mixed hyperlipidemia Chronic, controlled Continue with current medications, tolerating medications well. - Lipid panel  5. Left hand pain Left 3rd phalange tenderness and a callous is noted to the palm of her hand of same finger   6. Encounter for immunization Rx's sent to pharmacy - Pneumococcal polysaccharide vaccine 23-valent greater than or equal to 2yo subcutaneous/IM - Tdap (BOOSTRIX) 5-2.5-18.5 LF-MCG/0.5 injection; Inject 0.5 mLs into the muscle once for 1 dose.  Dispense: 0.5 mL; Refill: 0  Discussed her results of abdomen CT again.  Patient was given opportunity to ask questions. Patient verbalized understanding of the plan and was able to repeat key elements of the plan. All questions were answered to their satisfaction.  JMinette Brine FNP   I, JMinette Brine FNP, have reviewed all documentation for this visit. The documentation on 09/11/20 for the exam, diagnosis, procedures, and orders are all accurate and complete.   IF YOU HAVE BEEN REFERRED TO A SPECIALIST, IT MAY TAKE 1-2 WEEKS TO SCHEDULE/PROCESS THE REFERRAL. IF YOU HAVE NOT HEARD FROM US/SPECIALIST IN TWO WEEKS, PLEASE GIVE [place] CALL AT [phone] X [phone].   THE PATIENT IS ENCOURAGED TO PRACTICE SOCIAL DISTANCING DUE TO THE COVID-19 PANDEMIC.

## 2020-09-11 NOTE — Patient Instructions (Signed)
Tdap (Tetanus, Diphtheria, Pertussis) Vaccine: What You Need to Know 1. Why get vaccinated? Tdap vaccine can prevent tetanus, diphtheria, and pertussis. Diphtheria and pertussis spread from person to person. Tetanus enters the body through cuts or wounds. TETANUS (T) causes painful stiffening of the muscles. Tetanus can lead to serious health problems, including being unable to open the mouth, having trouble swallowing and breathing, or death. DIPHTHERIA (D) can lead to difficulty breathing, heart failure, paralysis, or death. PERTUSSIS (aP), also known as "whooping cough," can cause uncontrollable, violent coughing that makes it hard to breathe, eat, or drink. Pertussis can be extremely serious especially in babies and young children, causing pneumonia, convulsions, brain damage, or death. In teens and adults, it can cause weight loss, loss of bladder control, passing out, and rib fractures from severe coughing. 2. Tdap vaccine Tdap is only for children 7 years and older, adolescents, and adults.  Adolescents should receive a single dose of Tdap, preferably at age 11 or 12 years. Pregnant people should get a dose of Tdap during every pregnancy, preferably during the early part of the third trimester, to help protect the newborn from pertussis. Infants are most at risk for severe, life-threatening complications frompertussis. Adults who have never received Tdap should get a dose of Tdap. Also, adults should receive a booster dose of either Tdap or Td (a different vaccine that protects against tetanus and diphtheria but not pertussis) every 10 years, or after 5 years in the case of a severe or dirty wound or burn. Tdap may be given at the same time as other vaccines. 3. Talk with your health care provider Tell your vaccine provider if the person getting the vaccine: Has had an allergic reaction after a previous dose of any vaccine that protects against tetanus, diphtheria, or pertussis, or has any  severe, life-threatening allergies Has had a coma, decreased level of consciousness, or prolonged seizures within 7 days after a previous dose of any pertussis vaccine (DTP, DTaP, or Tdap) Has seizures or another nervous system problem Has ever had Guillain-Barr Syndrome (also called "GBS") Has had severe pain or swelling after a previous dose of any vaccine that protects against tetanus or diphtheria In some cases, your health care provider may decide to postpone Tdapvaccination until a future visit. People with minor illnesses, such as a cold, may be vaccinated. People who are moderately or severely ill should usually wait until they recover beforegetting Tdap vaccine.  Your health care provider can give you more information. 4. Risks of a vaccine reaction Pain, redness, or swelling where the shot was given, mild fever, headache, feeling tired, and nausea, vomiting, diarrhea, or stomachache sometimes happen after Tdap vaccination. People sometimes faint after medical procedures, including vaccination. Tellyour provider if you feel dizzy or have vision changes or ringing in the ears.  As with any medicine, there is a very remote chance of a vaccine causing asevere allergic reaction, other serious injury, or death. 5. What if there is a serious problem? An allergic reaction could occur after the vaccinated person leaves the clinic. If you see signs of a severe allergic reaction (hives, swelling of the face and throat, difficulty breathing, a fast heartbeat, dizziness, or weakness), call 9-1-1and get the person to the nearest hospital. For other signs that concern you, call your health care provider.  Adverse reactions should be reported to the Vaccine Adverse Event Reporting System (VAERS). Your health care provider will usually file this report, or you can do it yourself. Visit the   VAERS website at www.vaers.SamedayNews.es or call (484) 656-5889. VAERS is only for reporting reactions, and VAERS staff  members do not give medical advice. 6. The National Vaccine Injury Compensation Program The [employer] Vaccine Injury Compensation Program (VICP) is a federal program that was created to compensate people who may have been injured by certain vaccines. Claims regarding alleged injury or death due to vaccination have a time limit for filing, which may be as short as two years. Visit the VICP website at GoldCloset.com.ee or call 6607350625to learn about the program and about filing a claim. 7. How can I learn more? Ask your health care provider. Call your local or state health department. Visit the website of the Food and Drug Administration (FDA) for vaccine package inserts and additional information at TraderRating.uy. Contact the Centers for Disease Control and Prevention (CDC): Call 530-344-8918 (1-800-CDC-INFO) or Visit CDC's website at http://hunter.com/. Vaccine Information Statement Tdap (Tetanus, Diphtheria, Pertussis) Vaccine(10/28/2019) This information is not intended to replace advice given to you by your health care provider. Make sure you discuss any questions you have with your healthcare provider. Document Revised: 11/23/2019 Document Reviewed: 11/23/2019 Elsevier Patient Education  [place]. Pneumococcal Conjugate Vaccine (PCV13): What You Need to Know 1. Why get vaccinated? Pneumococcal conjugate vaccine (PCV13) can prevent pneumococcal disease. Pneumococcal disease refers to any illness caused by pneumococcal bacteria. These bacteria can cause many types of illnesses, including pneumonia, which is an infection ofthe lungs. Pneumococcal bacteria are one of the most common causes of pneumonia. Besides pneumonia, pneumococcal bacteria can also cause: Ear infections Sinus infections Meningitis (infection of the tissue covering the brain and spinal cord) Bacteremia (infection of the blood) Anyone can get pneumococcal  disease, but children under 43 years old, people with certain medical conditions, adults 39 years or older, and cigarettesmokers are at the highest risk. Most pneumococcal infections are mild. However, some can result in long-term problems, such as brain damage or hearing loss. Meningitis, bacteremia, andpneumonia caused by pneumococcal disease can be fatal. 2. PCV13 PCV13 protects against 13 types of bacteria that cause pneumococcal disease. Infants and young children usually need 4 doses of pneumococcal conjugate vaccine, at ages 81, 39, 105, and 12-15 months. Older children (through age 24 months) may be vaccinated if they did not receive the recommended doses. A dose of PCV13 is also recommended for adults and children 6 years or olderwith certain medical conditions if they did not already receive PCV13. This vaccine may be given to healthy adults 67 years or older who did not already receive PCV13, based on discussions between the patientand health care provider. 3. Talk with your health care provider Tell your vaccination provider if the person getting the vaccine: Has had an allergic reaction after a previous dose of PCV13, to an earlier pneumococcal conjugate vaccine known as PCV7, or to any vaccine containing diphtheria toxoid (for example, DTaP), or has any severe, life-threatening allergies In some cases, your health care provider may decide to postpone PCV13vaccination until a future visit. People with minor illnesses, such as a cold, may be vaccinated. People who are moderately or severely ill should usually wait until they recover beforegetting PCV13. Your health care provider can give you more information. 4. Risks of a vaccine reaction Redness, swelling, pain, or tenderness where the shot is given, and fever, loss of appetite, fussiness (irritability), feeling tired, headache, and chills can happen after PCV13 vaccination. Young children may be at increased risk for seizures caused by fever  after PCV13 if it is  administered at the same time as inactivated influenza vaccine.Ask your health care provider for more information. People sometimes faint after medical procedures, including vaccination. Tellyour provider if you feel dizzy or have vision changes or ringing in the ears. As with any medicine, there is a very remote chance of a vaccine causing asevere allergic reaction, other serious injury, or death. 5. What if there is a serious problem? An allergic reaction could occur after the vaccinated person leaves the clinic. If you see signs of a severe allergic reaction (hives, swelling of the face and throat, difficulty breathing, a fast heartbeat, dizziness, or weakness), call 9-1-1 and get the person to the nearest hospital. For other signs that concern you, call your health care provider. Adverse reactions should be reported to the Vaccine Adverse Event Reporting System (VAERS). Your health care provider will usually file this report, or you can do it yourself. Visit the VAERS website at www.vaers.SamedayNews.es or call 608-521-5758. VAERS is only for reporting reactions, and VAERS staff members do not give medical advice. 6. The National Vaccine Injury Compensation Program The [employer] Vaccine Injury Compensation Program (VICP) is a federal program that was created to compensate people who may have been injured by certain vaccines. Claims regarding alleged injury or death due to vaccination have a time limit for filing, which may be as short as two years. Visit the VICP website at GoldCloset.com.ee or call 661-453-2044 to learn about the program and about filing a claim. 7. How can I learn more? Ask your health care provider. Call your local or state health department. Visit the website of the Food and Drug Administration (FDA) for vaccine package inserts and additional information at TraderRating.uy. Contact the Centers for Disease Control and  Prevention (CDC): Call 619-495-3332 (1-800-CDC-INFO) or Visit CDC's website at http://hunter.com/. Vaccine Information Statement PCV13 (10/28/2019) This information is not intended to replace advice given to you by your health care provider. Make sure you discuss any questions you have with your healthcare provider. Document Revised: 12/15/2019 Document Reviewed: 12/15/2019 Elsevier Patient Education  2022 [employer].

## 2020-09-12 LAB — LIPID PANEL
Chol/HDL Ratio: 1.9 ratio (ref 0.0–4.4)
Cholesterol, Total: 189 mg/dL (ref 100–199)
HDL: 98 mg/dL (ref >39)
LDL Chol Calc (NIH): 84 mg/dL (ref 0–99)
Triglycerides: 31 mg/dL (ref 0–149)
VLDL Cholesterol Cal: 7 mg/dL (ref 5–40)

## 2020-09-12 LAB — CMP14+EGFR
ALT: 29 IU/L (ref 0–32)
AST: 29 IU/L (ref 0–40)
Albumin/Globulin Ratio: 2 (ref 1.2–2.2)
Albumin: 4.7 g/dL (ref 3.7–4.7)
Alkaline Phosphatase: 87 IU/L (ref 44–121)
BUN/Creatinine Ratio: 17 (ref 12–28)
BUN: 13 mg/dL (ref 8–27)
Bilirubin Total: 0.5 mg/dL (ref 0.0–1.2)
CO2: 26 mmol/L (ref 20–29)
Calcium: 9.8 mg/dL (ref 8.7–10.3)
Chloride: 103 mmol/L (ref 96–106)
Creatinine, Ser: 0.77 mg/dL (ref 0.57–1.00)
Globulin, Total: 2.3 g/dL (ref 1.5–4.5)
Glucose: 127 mg/dL — ABNORMAL HIGH (ref 65–99)
Potassium: 4.5 mmol/L (ref 3.5–5.2)
Sodium: 140 mmol/L (ref 134–144)
Total Protein: 7 g/dL (ref 6.0–8.5)
eGFR: 79 mL/min/{1.73_m2} (ref >59)

## 2020-09-12 LAB — HEMOGLOBIN A1C
Est. average glucose Bld gHb Est-mCnc: 143 mg/dL
Hgb A1c MFr Bld: 6.6 % — ABNORMAL HIGH (ref 4.8–5.6)

## 2020-09-13 ENCOUNTER — Telehealth: Payer: Self-pay | Admitting: Hematology

## 2020-09-13 NOTE — Telephone Encounter (Signed)
Rescheduled upcoming appointment due to provider's PAL. Patient is aware of changes. ?

## 2020-09-25 ENCOUNTER — Other Ambulatory Visit: Payer: Self-pay

## 2020-09-25 ENCOUNTER — Ambulatory Visit: Payer: Medicare Other

## 2020-09-25 VITALS — BP 124/80 | HR 74 | Temp 98.3°F | Ht 64.0 in | Wt 127.4 lb

## 2020-09-25 DIAGNOSIS — I10 Essential (primary) hypertension: Secondary | ICD-10-CM

## 2020-09-25 NOTE — Progress Notes (Signed)
Pt is here for BPC. She currently takes amlodipine 10mg  in the morning and olmesartan 40mg  at night.  BP Readings from Last 3 Encounters:  09/25/20 124/80  09/11/20 (!) 142/78  08/02/20 (!) 143/78  Provider stated to continue current meds, and follow up with her in 3 months.

## 2020-10-01 ENCOUNTER — Other Ambulatory Visit: Payer: Medicare Other

## 2020-10-01 ENCOUNTER — Ambulatory Visit: Payer: Medicare Other | Admitting: Hematology

## 2020-10-02 ENCOUNTER — Other Ambulatory Visit: Payer: Self-pay | Admitting: Nurse Practitioner

## 2020-10-02 DIAGNOSIS — C50211 Malignant neoplasm of upper-inner quadrant of right female breast: Secondary | ICD-10-CM

## 2020-10-02 DIAGNOSIS — Z170 Estrogen receptor positive status [ER+]: Secondary | ICD-10-CM

## 2020-10-02 NOTE — Progress Notes (Signed)
Elizabeth Mcfarland   [phone] Fax:([phone]   Clinic Follow up Note   Patient Care Team: [name], [place] as PCP - General (General Practice) [name], RN as Oncology Nurse Navigator [name], RN as Oncology Nurse Navigator [name], MD as Consulting Physician (Hematology) [name], MD as Consulting Physician (General Surgery) [name], MD as Consulting Physician (Radiation Oncology) Alla Feeling, NP as Nurse Practitioner (Nurse Practitioner) 10/03/2020  CHIEF COMPLAINT: Follow-up right breast cancer  SUMMARY OF ONCOLOGIC HISTORY: Oncology History Overview Note  Cancer Staging Malignant neoplasm of upper-inner quadrant of right breast in female, estrogen receptor positive ([place]) Staging form: Breast, AJCC 8th Edition - Clinical stage from 10/07/2018: cT2, cN0, cM0, GX, ER+, PR+, HER2: Equivocal - Signed by [name], MD on 10/12/2018 - Pathologic stage from 12/25/2018: Stage IA (pT2, pN0, cM0, G2, ER+, PR+, HER2-, Oncotype DX score: 21) - Signed by [name], MD on 02/01/2019     Malignant neoplasm of upper-inner quadrant of right breast in female, estrogen receptor positive ([place])  10/01/2018 Imaging   Bone Density Scan 10/01/18  IMPRESSION Lowest T-score of -0.9 at left femur neck (NORMAL) T-score DualFemur Neck Left  10/01/2018    76.1         -0.9    0.916 g/cm2   AP Spine  L1-L4      10/01/2018    76.1         1.2     1.326 g/cm2   DualFemur Total Mean 10/01/2018    76.1         0.1     1.024 g/cm2   10/05/2018 Mammogram   Diagnostic Mammogram 10/05/18 IMPRESSION: 1. Highly suspicious right breast mass 1 o'clock position 2 cm from the nipple on the right. It measures 2.1 x 1.7 x 1.5 cm. Corresponding with the screening mammographic findings. Recommendation is for ultrasound-guided biopsy. 2. Indeterminate left breast mass at the 10 o'clock position 9 cm from the nipple. It measures 1.3 x 0.5 x 0.3 cm. This may  represent a benign etiology such as a degenerating fibroadenoma. However, ultrasound-guided biopsy for definitive tissue diagnosis is recommended. 3. No suspicious lymphadenopathy bilaterally.   10/07/2018 Cancer Staging   Staging form: Breast, AJCC 8th Edition - Clinical stage from 10/07/2018: cT2, cN0, cM0, GX, ER+, PR+, HER2: Equivocal - Signed by [name], MD on 10/12/2018    10/07/2018 Initial Biopsy   Diagnosis 10/07/18 1. Breast, right, needle core biopsy, 1 o'clock, 2cmfn - INVASIVE MAMMARY CARCINOMA WITH CALCIFICATIONS. SEE NOTE 2. Breast, left, needle core biopsy, 10 o'clock - FIBROADENOMA WITH DYSTROPHIC CALCIFICATIONS - NEGATIVE FOR CARCINOMA   10/07/2018 Receptors her2   By immunohistochemistry, the tumor cells are EQUIVOCAL for Her2 (2+). HER2 by [place] will be PERFORMED and the RESULTS REPORTED SEPARATELY Estrogen Receptor: 100%, POSITIVE, STRONG STAINING INTENSITY Progesterone Receptor: 70%, POSITIVE, STRONG STAINING INTENSITY Proliferation Marker Ki67: 10%   10/11/2018 Initial Diagnosis   Malignant neoplasm of upper-inner quadrant of right breast in female, estrogen receptor positive ([place])    10/2018 -  Neo-Adjuvant Anti-estrogen oral therapy   She started anastrozole end of 10/2018 before surgery.   11/25/2018 Surgery   BILATERAL BREAST LUMPECTOMIES  WITH BILATERAL  RADIOACTIVE SEEDS AND RIGHT SENTINEL LYMPH NODE BIOPSY by Dr [name]  11/24/28   11/25/2018 Pathology Results   Diagnosis 11/25/18 1. Breast, lumpectomy, Left w/seed - FIBROADENOMA WITH CALCIFICATIONS. - BIOPSY SITE CHANGES. 2. Lymph node, sentinel, biopsy, Right Axillary #1 -  ONE LYMPH NODE, NEGATIVE FOR CARCINOMA (0/1). 3. Lymph node, sentinel, biopsy, Right Axillary #2 - ONE LYMPH NODE, NEGATIVE FOR CARCINOMA (0/1). 4. Lymph node, sentinel, biopsy, Right - ONE LYMPH NODE, NEGATIVE FOR CARCINOMA (0/1). 5. Breast, lumpectomy, Right w/seed - INVASIVE DUCTAL CARCINOMA, 2.6 CM, NOTTINGHAM GRADE 2 OF 3. -  MARGINS OF RESECTION ARE NOT INVOLVED (CLOSEST MARGIN: LESS THAN 1 MM, SUPERIOR). - BIOPSY SITE CHANGES. - SEE ONCOLOGY TABLE. 6. Medical device, removal, radioactive seed - RADIOACTIVE SEED. - GROSS ONLY.   11/25/2018 Oncotype testing   Oncotype 11/25/18 Recurrence Score 21 with distant recurrence risk at 9 years of with Tamoxifen alone 7%.  There is less than 1% benefit from chemo.    12/25/2018 Cancer Staging   Staging form: Breast, AJCC 8th Edition - Pathologic stage from 12/25/2018: Stage IA (pT2, pN0, cM0, G2, ER+, PR+, HER2-, Oncotype DX score: 21) - Signed by [name], MD on 02/01/2019    01/11/2019 - 02/08/2019 Radiation Therapy   Adjuvant Radiation with Dr [name] 01/11/19-02/08/19   05/05/2019 Survivorship   SCP delivered by Cira Rue, NP      CURRENT THERAPY: Anastrozole 1 mg daily starting 10/2018  INTERVAL HISTORY: Elizabeth Mcfarland returns for follow-up as scheduled.  She was last seen 04/02/2020.  She continues anastrozole.  Mammogram and DEXA are scheduled this month 10/10/2020.  She has occasional discomfort in both breasts, no new lump/mass, nipple discharge or inversion, or skin change.  PT helped swelling, now she does exercises at home.  She continues normal activity, working in the yard.  Her back is sore more in the morning for the past 6 months, improves as the day progresses.  Her posture is somewhat affected.  She wears a brace.  This is managed by Dr. Laurance Flatten.  Denies new or worsening bone/joint pain.  She denies hot flashes.  She is anxious, 2 brothers and her son-in-law died in the past year, she is managing.  She had an episode with numbness in the feet, scheduled a neurology appointment now 5 months later the neuropathy is gone.  She still plans to see neurologist soon.  Denies unintentional weight loss, fever/chills, blood in stool, vaginal bleeding, signs of thrombosis, or any new concerns.    MEDICAL HISTORY:  Past Medical History:  Diagnosis Date   Allergy     Anemia    Blood transfusion without reported diagnosis    Breast cancer ([place]) 11/2018   Right breast invasive mammary carcinoma   Cancer ([place])    Cataract    Chronic kidney disease    Diabetes mellitus without complication ([place])    Esophageal ulcer with bleeding 05/05/2013   05/05/2013 EGD linear distal esophageal ulcer - cause of hematemesis prior to EGD    GERD (gastroesophageal reflux disease)    History of kidney stones    Hyperlipidemia    Hypertension    Kidney cysts    Personal history of radiation therapy    Sarcoidosis 1970    SURGICAL HISTORY: Past Surgical History:  Procedure Laterality Date   ABDOMINAL HYSTERECTOMY     BREAST EXCISIONAL BIOPSY Left 11/2018   Fibroadenoma   BREAST LUMPECTOMY     BREAST LUMPECTOMY WITH RADIOACTIVE SEED AND SENTINEL LYMPH NODE BIOPSY Bilateral 11/25/2018   Procedure: BILATERAL BREAST LUMPECTOMIES  WITH BILATERAL  RADIOACTIVE SEEDS AND RIGHT SENTINEL LYMPH NODE BIOPSY;  Surgeon: [name], MD;  Location: [facility];  Service: General;  Laterality: Bilateral;   CATARACT EXTRACTION W/ INTRAOCULAR LENS IMPLANT  COLONOSCOPY     COLONOSCOPY, ESOPHAGOGASTRODUODENOSCOPY (EGD) AND ESOPHAGEAL DILATION N/A 05/05/2013   Procedure: COLONOSCOPY, ESOPHAGOGASTRODUODENOSCOPY (EGD) AND ESOPHAGEAL DILATION (ED);  Surgeon: Gatha Mayer, MD;  Location: [facility];  Service: Endoscopy;  Laterality: N/A;   LITHOTRIPSY     POLYPECTOMY     UPPER GASTROINTESTINAL ENDOSCOPY      I have reviewed the social history and family history with the patient and they are unchanged from previous note.  ALLERGIES:  is allergic to codeine and percocet [oxycodone-acetaminophen].  MEDICATIONS:  Current Outpatient Medications  Medication Sig Dispense Refill   acetaminophen (TYLENOL) 500 MG tablet Take 1,000 mg by mouth every 6 (six) hours as needed for moderate pain or headache.      amLODipine (NORVASC) 10 MG tablet TAKE 1 TABLET BY MOUTH EVERY DAY 90 tablet 1    anastrozole (ARIMIDEX) 1 MG tablet TAKE 1 TABLET(1 MG) BY MOUTH DAILY 30 tablet 3   Ascorbic Acid (VITAMIN C) 1000 MG tablet Take 1,000 mg by mouth daily.     aspirin EC 81 MG tablet Take 81 mg by mouth daily.     atorvastatin (LIPITOR) 20 MG tablet TAKE 1 TABLET(20 MG) BY MOUTH AT BEDTIME 90 tablet 1   cholecalciferol (VITAMIN D) 1000 UNITS tablet Take 1,000 Units by mouth daily.      glimepiride (AMARYL) 4 MG tablet Take 1 tab (82m) in am and 1/2 tab (272m at bedtime 135 tablet 1   Multiple Vitamin (MULTIVITAMIN WITH MINERALS) TABS tablet Take 1 tablet by mouth daily.      olmesartan (BENICAR) 40 MG tablet Take 1 tablet (40 mg total) by mouth daily. 90 tablet 1   omeprazole (PRILOSEC) 20 MG capsule TAKE 1 TABLET(20 MG) BY MOUTH DAILY 90 capsule 1   ONETOUCH ULTRA test strip USE AS DIRECTED TO CHECK BLOOD SUGAR TWICE DAILY 100 strip 3   Polyethyl Glycol-Propyl Glycol (SYSTANE OP) Place 1 drop into both eyes 2 (two) times daily.     polyethylene glycol (MIRALAX / GLYCOLAX) packet Take 17 g by mouth daily.      No current facility-administered medications for this visit.    PHYSICAL EXAMINATION: ECOG PERFORMANCE STATUS: 1 - Symptomatic but completely ambulatory  Vitals:   10/03/20 1211  BP: (!) 147/71  Pulse: 94  Resp: 18  Temp: 98.4 F (36.9 C)  SpO2: 100%   Filed Weights   10/03/20 1211  Weight: 126 lb 12.8 oz (57.5 kg)    GENERAL:alert, no distress and comfortable SKIN: No rash EYES: sclera clear NECK: Without mass LYMPH:  no palpable cervical or supraclavicular lymphadenopathy  LUNGS: normal breathing effort HEART:  no lower extremity edema NEURO: alert & oriented x 3 with fluent speech, no focal motor/sensory deficits Breast exam: Breasts are asymmetrical without nipple discharge or inversion.  S/p bilateral lumpectomies and right breast radiation.  Incisions completely healed.  Right breast with central edema and skin thickening.  No palpable mass in either breast or  axilla that I could appreciate  LABORATORY DATA:  I have reviewed the data as listed CBC Latest Ref Rng & Units 10/03/2020 04/02/2020 12/20/2019  WBC 4.0 - 10.5 K/uL 5.1 7.6 5.7  Hemoglobin 12.0 - 15.0 g/dL 12.0 12.2 11.9  Hematocrit 36.0 - 46.0 % 36.8 37.4 35.1  Platelets 150 - 400 K/uL 204 222 268     CMP Latest Ref Rng & Units 10/03/2020 09/11/2020 04/02/2020  Glucose 70 - 99 mg/dL 193(H) 127(H) 175(H)  BUN 8 - 23 mg/dL 14  13 10  Creatinine 0.44 - 1.00 mg/dL 0.92 0.77 0.87  Sodium 135 - 145 mmol/L 139 140 140  Potassium 3.5 - 5.1 mmol/L 4.6 4.5 4.0  Chloride 98 - 111 mmol/L 104 103 105  CO2 22 - 32 mmol/L '30 26 29  ' Calcium 8.9 - 10.3 mg/dL 9.6 9.8 9.4  Total Protein 6.5 - 8.1 g/dL 7.0 7.0 7.1  Total Bilirubin 0.3 - 1.2 mg/dL 0.5 0.5 0.6  Alkaline Phos 38 - 126 U/L 76 87 71  AST 15 - 41 U/L '29 29 26  ' ALT 0 - 44 U/L 34 29 26      RADIOGRAPHIC STUDIES: I have personally reviewed the radiological images as listed and agreed with the findings in the report. No results found.   ASSESSMENT & PLAN: Elizabeth Mcfarland is a 80 y.o. female with     1. 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 S/p neoadjuvant anastrozole, b/l lumpectomy, and adjuvant radiation.  Oncotype was low risk, adjuvant chemo was not recommended.  She continues antiestrogen therapy with anastrozole starting 10/2018 -09/2019 mammogram was benign -Continue surveillance and AI   2. DM, HTN -On amlodipine, glimepiride, janumet. 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 Bone health -08/2018 DEXA was normal with lowest T-score 0.9 at left femur neck. -On calcium and Vitamin D.  -Repeat DEXA 10/10/2020 with mammogram   Disposition: Elizabeth Mcfarland is clinically doing well.  Tolerating anastrozole.  Breast exam shows postoperative and postradiation  changes, otherwise benign.  CBC and CMP are normal except BG 193.  There is no clinical concern for breast cancer recurrence.  Continue surveillance and anastrozole.  Next mammogram and DEXA on 10/10/2020.  I encouraged her to continue healthy lifestyle, physical activity, normal diet, avoiding smoking, limiting alcohol.  Return for lab and routine surveillance visit in 6 months.  All questions were answered. The patient knows to call the clinic with any problems, questions or concerns. No barriers to learning was detected.     Alla Feeling, NP 10/03/20

## 2020-10-03 ENCOUNTER — Encounter: Payer: Self-pay | Admitting: Nurse Practitioner

## 2020-10-03 ENCOUNTER — Inpatient Hospital Stay: Payer: Medicare Other | Attending: Nurse Practitioner

## 2020-10-03 ENCOUNTER — Inpatient Hospital Stay (HOSPITAL_BASED_OUTPATIENT_CLINIC_OR_DEPARTMENT_OTHER): Payer: Medicare Other | Admitting: Nurse Practitioner

## 2020-10-03 ENCOUNTER — Other Ambulatory Visit: Payer: Self-pay

## 2020-10-03 VITALS — BP 147/71 | HR 94 | Temp 98.4°F | Resp 18 | Wt 126.8 lb

## 2020-10-03 DIAGNOSIS — Z842 Family history of other diseases of the genitourinary system: Secondary | ICD-10-CM | POA: Diagnosis not present

## 2020-10-03 DIAGNOSIS — C50211 Malignant neoplasm of upper-inner quadrant of right female breast: Secondary | ICD-10-CM

## 2020-10-03 DIAGNOSIS — I890 Lymphedema, not elsewhere classified: Secondary | ICD-10-CM | POA: Diagnosis not present

## 2020-10-03 DIAGNOSIS — Z79899 Other long term (current) drug therapy: Secondary | ICD-10-CM | POA: Diagnosis not present

## 2020-10-03 DIAGNOSIS — E1136 Type 2 diabetes mellitus with diabetic cataract: Secondary | ICD-10-CM | POA: Insufficient documentation

## 2020-10-03 DIAGNOSIS — E1122 Type 2 diabetes mellitus with diabetic chronic kidney disease: Secondary | ICD-10-CM | POA: Diagnosis not present

## 2020-10-03 DIAGNOSIS — Z923 Personal history of irradiation: Secondary | ICD-10-CM | POA: Insufficient documentation

## 2020-10-03 DIAGNOSIS — Z79811 Long term (current) use of aromatase inhibitors: Secondary | ICD-10-CM | POA: Insufficient documentation

## 2020-10-03 DIAGNOSIS — N189 Chronic kidney disease, unspecified: Secondary | ICD-10-CM | POA: Diagnosis not present

## 2020-10-03 DIAGNOSIS — E1142 Type 2 diabetes mellitus with diabetic polyneuropathy: Secondary | ICD-10-CM | POA: Insufficient documentation

## 2020-10-03 DIAGNOSIS — D649 Anemia, unspecified: Secondary | ICD-10-CM | POA: Diagnosis not present

## 2020-10-03 DIAGNOSIS — Z8249 Family history of ischemic heart disease and other diseases of the circulatory system: Secondary | ICD-10-CM | POA: Insufficient documentation

## 2020-10-03 DIAGNOSIS — Z170 Estrogen receptor positive status [ER+]: Secondary | ICD-10-CM

## 2020-10-03 DIAGNOSIS — Z833 Family history of diabetes mellitus: Secondary | ICD-10-CM | POA: Insufficient documentation

## 2020-10-03 DIAGNOSIS — D869 Sarcoidosis, unspecified: Secondary | ICD-10-CM | POA: Diagnosis not present

## 2020-10-03 DIAGNOSIS — Z90710 Acquired absence of both cervix and uterus: Secondary | ICD-10-CM | POA: Insufficient documentation

## 2020-10-03 DIAGNOSIS — Z7984 Long term (current) use of oral hypoglycemic drugs: Secondary | ICD-10-CM | POA: Diagnosis not present

## 2020-10-03 DIAGNOSIS — I129 Hypertensive chronic kidney disease with stage 1 through stage 4 chronic kidney disease, or unspecified chronic kidney disease: Secondary | ICD-10-CM | POA: Diagnosis not present

## 2020-10-03 LAB — CBC WITH DIFFERENTIAL (CANCER CENTER ONLY)
Abs Immature Granulocytes: 0.01 10*3/uL (ref 0.00–0.07)
Basophils Absolute: 0 10*3/uL (ref 0.0–0.1)
Basophils Relative: 1 %
Eosinophils Absolute: 0 10*3/uL (ref 0.0–0.5)
Eosinophils Relative: 1 %
HCT: 36.8 % (ref 36.0–46.0)
Hemoglobin: 12 g/dL (ref 12.0–15.0)
Immature Granulocytes: 0 %
Lymphocytes Relative: 21 %
Lymphs Abs: 1.1 10*3/uL (ref 0.7–4.0)
MCH: 29.6 pg (ref 26.0–34.0)
MCHC: 32.6 g/dL (ref 30.0–36.0)
MCV: 90.9 fL (ref 80.0–100.0)
Monocytes Absolute: 0.4 10*3/uL (ref 0.1–1.0)
Monocytes Relative: 9 %
Neutro Abs: 3.5 10*3/uL (ref 1.7–7.7)
Neutrophils Relative %: 68 %
Platelet Count: 204 10*3/uL (ref 150–400)
RBC: 4.05 MIL/uL (ref 3.87–5.11)
RDW: 12.5 % (ref 11.5–15.5)
WBC Count: 5.1 10*3/uL (ref 4.0–10.5)
nRBC: 0 % (ref 0.0–0.2)

## 2020-10-03 LAB — CMP (CANCER CENTER ONLY)
ALT: 34 U/L (ref 0–44)
AST: 29 U/L (ref 15–41)
Albumin: 3.8 g/dL (ref 3.5–5.0)
Alkaline Phosphatase: 76 U/L (ref 38–126)
Anion gap: 5 (ref 5–15)
BUN: 14 mg/dL (ref 8–23)
CO2: 30 mmol/L (ref 22–32)
Calcium: 9.6 mg/dL (ref 8.9–10.3)
Chloride: 104 mmol/L (ref 98–111)
Creatinine: 0.92 mg/dL (ref 0.44–1.00)
GFR, Estimated: >60 mL/min (ref >60)
Glucose, Bld: 193 mg/dL — ABNORMAL HIGH (ref 70–99)
Potassium: 4.6 mmol/L (ref 3.5–5.1)
Sodium: 139 mmol/L (ref 135–145)
Total Bilirubin: 0.5 mg/dL (ref 0.3–1.2)
Total Protein: 7 g/dL (ref 6.5–8.1)

## 2020-10-05 ENCOUNTER — Telehealth: Payer: Self-pay | Admitting: Hematology

## 2020-10-05 NOTE — Telephone Encounter (Signed)
Scheduled follow-up appointment per 7/13 los. Patient is aware. 

## 2020-10-10 ENCOUNTER — Ambulatory Visit: Payer: Medicare Other | Admitting: Diagnostic Neuroimaging

## 2020-10-10 ENCOUNTER — Encounter: Payer: Self-pay | Admitting: Diagnostic Neuroimaging

## 2020-10-10 ENCOUNTER — Other Ambulatory Visit: Payer: Self-pay

## 2020-10-10 ENCOUNTER — Ambulatory Visit
Admission: RE | Admit: 2020-10-10 | Discharge: 2020-10-10 | Disposition: A | Discharge status: Home / Self Care | Payer: Medicare Other | Source: Ambulatory Visit | Attending: Hematology | Admitting: Hematology

## 2020-10-10 VITALS — BP 155/73 | HR 65 | Ht 64.0 in | Wt 129.4 lb

## 2020-10-10 DIAGNOSIS — C50211 Malignant neoplasm of upper-inner quadrant of right female breast: Secondary | ICD-10-CM

## 2020-10-10 DIAGNOSIS — R922 Inconclusive mammogram: Secondary | ICD-10-CM | POA: Diagnosis not present

## 2020-10-10 DIAGNOSIS — R200 Anesthesia of skin: Secondary | ICD-10-CM | POA: Diagnosis not present

## 2020-10-10 DIAGNOSIS — Z170 Estrogen receptor positive status [ER+]: Secondary | ICD-10-CM

## 2020-10-10 DIAGNOSIS — E2839 Other primary ovarian failure: Secondary | ICD-10-CM

## 2020-10-10 DIAGNOSIS — M85832 Other specified disorders of bone density and structure, left forearm: Secondary | ICD-10-CM | POA: Diagnosis not present

## 2020-10-10 DIAGNOSIS — Z780 Asymptomatic menopausal state: Secondary | ICD-10-CM | POA: Diagnosis not present

## 2020-10-10 NOTE — Progress Notes (Signed)
[facility]  PATIENT: Elizabeth Mcfarland DOB: Sep 13, 1940  REFERRING CLINICIAN: [name], FNP HISTORY FROM: patient  REASON FOR VISIT: new consult    HISTORICAL  CHIEF COMPLAINT:  Chief Complaint  Patient presents with   Tingling of skin    Rm 7 New pt, here alone  "intermittent tingling/numbness of skin on right foot, occas on left; has been occurring for several years"    HISTORY OF PRESENT ILLNESS:   80 year old female here for evaluation of right foot numbness.  Patient has had intermittent numbness in bilateral legs around age 65 years old.  Symptoms spontaneous improved.  In past 3 months patient has had intermittent numbness of the right foot.  Initially symptoms were severe for 2 to 3 days and then started to improve.  Symptoms have essentially resolved.  She has very minimal intermittent symptoms in the right foot.  No pain, weakness.  This does not impact her day-to-day activities.  She does have chronic low back pain but no radiating symptoms.   REVIEW OF SYSTEMS: Full 14 system review of systems performed and negative with exception of: as per HPI.  ALLERGIES: Allergies  Allergen Reactions   Codeine Nausea And Vomiting   Percocet [Oxycodone-Acetaminophen] Nausea And Vomiting    HOME MEDICATIONS: Outpatient Medications Prior to Visit  Medication Sig Dispense Refill   acetaminophen (TYLENOL) 500 MG tablet Take 1,000 mg by mouth every 6 (six) hours as needed for moderate pain or headache.      amLODipine (NORVASC) 10 MG tablet TAKE 1 TABLET BY MOUTH EVERY DAY 90 tablet 1   anastrozole (ARIMIDEX) 1 MG tablet TAKE 1 TABLET(1 MG) BY MOUTH DAILY 30 tablet 3   Ascorbic Acid (VITAMIN C) 1000 MG tablet Take 1,000 mg by mouth daily.     aspirin EC 81 MG tablet Take 81 mg by mouth daily.     atorvastatin (LIPITOR) 20 MG tablet TAKE 1 TABLET(20 MG) BY MOUTH AT BEDTIME 90 tablet 1   cholecalciferol (VITAMIN D) 1000 UNITS tablet Take 1,000 Units by  mouth daily.      glimepiride (AMARYL) 4 MG tablet Take 1 tab (4mg ) in am and 1/2 tab (2mg ) at bedtime 135 tablet 1   JANUVIA 50 MG tablet Take 50 mg by mouth daily.     Multiple Vitamin (MULTIVITAMIN WITH MINERALS) TABS tablet Take 1 tablet by mouth daily.      olmesartan (BENICAR) 40 MG tablet Take 1 tablet (40 mg total) by mouth daily. 90 tablet 1   omeprazole (PRILOSEC) 20 MG capsule TAKE 1 TABLET(20 MG) BY MOUTH DAILY 90 capsule 1   ONETOUCH ULTRA test strip USE AS DIRECTED TO CHECK BLOOD SUGAR TWICE DAILY 100 strip 3   Polyethyl Glycol-Propyl Glycol (SYSTANE OP) Place 1 drop into both eyes 2 (two) times daily.     polyethylene glycol (MIRALAX / GLYCOLAX) packet Take 17 g by mouth daily.      No facility-administered medications prior to visit.    PAST MEDICAL HISTORY: Past Medical History:  Diagnosis Date   Allergy    Anemia    Blood transfusion without reported diagnosis    Breast cancer ([place]) 11/2018   Right breast invasive mammary carcinoma   Cancer ([place])    Cataract    Chronic kidney disease    Diabetes mellitus without complication ([place])    Esophageal ulcer with bleeding 05/05/2013   05/05/2013 EGD linear distal esophageal ulcer - cause of hematemesis prior to EGD    GERD (  gastroesophageal reflux disease)    History of kidney stones    Hyperlipidemia    Hypertension    Kidney cysts    Personal history of radiation therapy    Sarcoidosis 1970    PAST SURGICAL HISTORY: Past Surgical History:  Procedure Laterality Date   ABDOMINAL HYSTERECTOMY     BREAST EXCISIONAL BIOPSY Left 11/2018   Fibroadenoma   BREAST LUMPECTOMY     BREAST LUMPECTOMY WITH RADIOACTIVE SEED AND SENTINEL LYMPH NODE BIOPSY Bilateral 11/25/2018   Procedure: BILATERAL BREAST LUMPECTOMIES  WITH BILATERAL  RADIOACTIVE SEEDS AND RIGHT SENTINEL LYMPH NODE BIOPSY;  Surgeon: [name], MD;  Location: [facility];  Service: General;  Laterality: Bilateral;   CATARACT EXTRACTION W/ INTRAOCULAR LENS IMPLANT      COLONOSCOPY     COLONOSCOPY, ESOPHAGOGASTRODUODENOSCOPY (EGD) AND ESOPHAGEAL DILATION N/A 05/05/2013   Procedure: COLONOSCOPY, ESOPHAGOGASTRODUODENOSCOPY (EGD) AND ESOPHAGEAL DILATION (ED);  Surgeon: Gatha Mayer, MD;  Location: [facility];  Service: Endoscopy;  Laterality: N/A;   LITHOTRIPSY     POLYPECTOMY     UPPER GASTROINTESTINAL ENDOSCOPY      FAMILY HISTORY: Family History  Problem Relation Age of Onset   Early death Mother    Stroke Father    Diabetes Sister    Kidney failure Sister    Colon polyps Sister    Stroke Brother    Colon polyps Brother    Cancer Brother 30       prostate cancer   Heart disease Brother    Emphysema Brother    Cancer Brother 75       prostate cancer   Asthma Brother    CAD Maternal Grandmother    Breast cancer Neg Hx    Colon cancer Neg Hx    Esophageal cancer Neg Hx    Rectal cancer Neg Hx    Stomach cancer Neg Hx     SOCIAL HISTORY: Social History   Socioeconomic History   Marital status: Single    Spouse name: Not on file   Number of children: 1   Years of education: Not on file   Highest education level: GED or equivalent  Occupational History   Occupation: Physiological scientist: SELF-EMPLOYED   Occupation: retired  Tobacco Use   Smoking status: Never   Smokeless tobacco: Never  Scientific laboratory technician Use: Never used  Substance and Sexual Activity   Alcohol use: No   Drug use: No   Sexual activity: Not Currently  Other Topics Concern   Not on file  Social History Narrative   Lives alone   Social Determinants of Health   Financial Resource Strain: Low Risk    Difficulty of Paying Living Expenses: Not hard at all  Food Insecurity: No Food Insecurity   Worried About Charity fundraiser in the Last Year: Never true   Arboriculturist in the Last Year: Never true  Transportation Needs: No Transportation Needs   Lack of Transportation (Medical): No   Lack of Transportation (Non-Medical): No  Physical Activity:  Inactive   Days of Exercise per Week: 0 days   Minutes of Exercise per Session: 0 min  Stress: No Stress Concern Present   Feeling of Stress : Not at all  Social Connections: Not on file  Intimate Partner Violence: Not on file     PHYSICAL EXAM  GENERAL EXAM/CONSTITUTIONAL: Vitals:  Vitals:   10/10/20 0942  BP: (!) 155/73  Pulse: 65  Weight: 129 lb  6.4 oz (58.7 kg)  Height: 5\' 4"  (1.626 m)   Body mass index is 22.21 kg/m. Wt Readings from Last 3 Encounters:  10/10/20 129 lb 6.4 oz (58.7 kg)  10/03/20 126 lb 12.8 oz (57.5 kg)  09/25/20 127 lb 6.4 oz (57.8 kg)   Patient is in no distress; well developed, nourished and groomed; neck is supple  CARDIOVASCULAR: Examination of carotid arteries is normal; no carotid bruits Regular rate and rhythm, no murmurs Examination of peripheral vascular system by observation and palpation is normal  EYES: Ophthalmoscopic exam of optic discs and posterior segments is normal; no papilledema or hemorrhages No results found.  MUSCULOSKELETAL: Gait, strength, tone, movements noted in Neurologic exam below  NEUROLOGIC: MENTAL STATUS:  No flowsheet data found. awake, alert, oriented to person, place and time recent and remote memory intact normal attention and concentration language fluent, comprehension intact, naming intact fund of knowledge appropriate  CRANIAL NERVE:  2nd - no papilledema on fundoscopic exam 2nd, 3rd, 4th, 6th - pupils equal and reactive to light, visual fields full to confrontation, extraocular muscles intact, no nystagmus 5th - facial sensation symmetric 7th - facial strength symmetric 8th - hearing intact 9th - palate elevates symmetrically, uvula midline 11th - shoulder shrug symmetric 12th - tongue protrusion midline  MOTOR:  normal bulk and tone, full strength in the BUE, BLE  SENSORY:  normal and symmetric to light touch, temperature, vibration  COORDINATION:  finger-nose-finger, fine finger  movements normal  REFLEXES:  deep tendon reflexes TRACE  and symmetric  GAIT/STATION:  narrow based gait     DIAGNOSTIC DATA (LABS, IMAGING, TESTING) - I reviewed patient records, labs, notes, testing and imaging myself where available.  Lab Results  Component Value Date   WBC 5.1 10/03/2020   HGB 12.0 10/03/2020   HCT 36.8 10/03/2020   MCV 90.9 10/03/2020   PLT 204 10/03/2020      Component Value Date/Time   NA 139 10/03/2020 1138   NA 140 09/11/2020 1018   K 4.6 10/03/2020 1138   CL 104 10/03/2020 1138   CO2 30 10/03/2020 1138   GLUCOSE 193 (H) 10/03/2020 1138   BUN 14 10/03/2020 1138   BUN 13 09/11/2020 1018   CREATININE 0.92 10/03/2020 1138   CALCIUM 9.6 10/03/2020 1138   PROT 7.0 10/03/2020 1138   PROT 7.0 09/11/2020 1018   ALBUMIN 3.8 10/03/2020 1138   ALBUMIN 4.7 09/11/2020 1018   AST 29 10/03/2020 1138   ALT 34 10/03/2020 1138   ALKPHOS 76 10/03/2020 1138   BILITOT 0.5 10/03/2020 1138   GFRNONAA >60 10/03/2020 1138   GFRAA 64 12/20/2019 1605   GFRAA >60 08/01/2019 0845   Lab Results  Component Value Date   CHOL 189 09/11/2020   HDL 98 09/11/2020   LDLCALC 84 09/11/2020   TRIG 31 09/11/2020   CHOLHDL 1.9 09/11/2020   Lab Results  Component Value Date   HGBA1C 6.6 (H) 09/11/2020   Lab Results  Component Value Date   VITAMINB12 2,304 (H) 04/02/2020   Lab Results  Component Value Date   TSH 1.150 05/31/2020       ASSESSMENT AND PLAN  80 y.o. year old female here with:  Dx:  1. Numbness of right foot      PLAN:  MILD RIGHT FOOT NUMBNESS (intermittent; improving; neurologic exam normal) - possible mild nerve irritation of right foot; recommend conservative mgmt as symptoms are mild and improving  Return for return to PCP, pending if symptoms worsen  or fail to improve.    Penni Bombard, MD 11/09/2991, 71:69 AM Certified in Neurology, Neurophysiology and Neuroimaging  [facility] [address], [place],  [address] [phone]

## 2020-10-10 NOTE — Patient Instructions (Signed)
  MILD RIGHT FOOT NUMBNESS (intermittent; improving) - possible mild nerve irritation of right foot; recommend conservative mgmt as symptoms are mild and improving

## 2020-10-18 ENCOUNTER — Ambulatory Visit: Payer: Medicare Other | Admitting: Nurse Practitioner

## 2020-11-15 DIAGNOSIS — E119 Type 2 diabetes mellitus without complications: Secondary | ICD-10-CM | POA: Diagnosis not present

## 2020-12-04 ENCOUNTER — Other Ambulatory Visit: Payer: Self-pay

## 2020-12-04 ENCOUNTER — Other Ambulatory Visit: Payer: Self-pay | Admitting: Hematology

## 2020-12-04 ENCOUNTER — Other Ambulatory Visit: Payer: Self-pay | Admitting: Nurse Practitioner

## 2020-12-04 ENCOUNTER — Ambulatory Visit (INDEPENDENT_AMBULATORY_CARE_PROVIDER_SITE_OTHER): Payer: Medicare Other | Admitting: Nurse Practitioner

## 2020-12-04 ENCOUNTER — Encounter: Payer: Self-pay | Admitting: Nurse Practitioner

## 2020-12-04 VITALS — BP 140/78 | HR 80 | Temp 98.3°F | Ht 64.0 in | Wt 129.4 lb

## 2020-12-04 DIAGNOSIS — IMO0002 Reserved for concepts with insufficient information to code with codable children: Secondary | ICD-10-CM

## 2020-12-04 DIAGNOSIS — E118 Type 2 diabetes mellitus with unspecified complications: Secondary | ICD-10-CM

## 2020-12-04 DIAGNOSIS — R12 Heartburn: Secondary | ICD-10-CM

## 2020-12-04 DIAGNOSIS — E782 Mixed hyperlipidemia: Secondary | ICD-10-CM

## 2020-12-04 DIAGNOSIS — R935 Abnormal findings on diagnostic imaging of other abdominal regions, including retroperitoneum: Secondary | ICD-10-CM | POA: Diagnosis not present

## 2020-12-04 DIAGNOSIS — D508 Other iron deficiency anemias: Secondary | ICD-10-CM

## 2020-12-04 DIAGNOSIS — R5383 Other fatigue: Secondary | ICD-10-CM | POA: Diagnosis not present

## 2020-12-04 DIAGNOSIS — I10 Essential (primary) hypertension: Secondary | ICD-10-CM | POA: Diagnosis not present

## 2020-12-04 DIAGNOSIS — E1165 Type 2 diabetes mellitus with hyperglycemia: Secondary | ICD-10-CM

## 2020-12-04 DIAGNOSIS — Z23 Encounter for immunization: Secondary | ICD-10-CM

## 2020-12-04 DIAGNOSIS — M25541 Pain in joints of right hand: Secondary | ICD-10-CM | POA: Diagnosis not present

## 2020-12-04 DIAGNOSIS — M25542 Pain in joints of left hand: Secondary | ICD-10-CM | POA: Diagnosis not present

## 2020-12-04 DIAGNOSIS — M6289 Other specified disorders of muscle: Secondary | ICD-10-CM | POA: Diagnosis not present

## 2020-12-04 DIAGNOSIS — E1169 Type 2 diabetes mellitus with other specified complication: Secondary | ICD-10-CM

## 2020-12-04 MED ORDER — ZOSTER VAC RECOMB ADJUVANTED 50 MCG/0.5ML IM SUSR: 0.5000 mL | Freq: Once | INTRAMUSCULAR | 1 refills | Status: AC

## 2020-12-04 NOTE — Patient Instructions (Signed)

## 2020-12-04 NOTE — Progress Notes (Signed)
I,Elizabeth Mcfarland,acting as a Education administrator for Elizabeth Brine, FNP.,have documented all relevant documentation on the behalf of Elizabeth Brine, FNP,as directed by  Elizabeth Brine, FNP while in the presence of Elizabeth Mcfarland, [place].   This visit occurred during the SARS-CoV-2 public health emergency.  Safety protocols were in place, including screening questions prior to the visit, additional usage of staff PPE, and extensive cleaning of exam room while observing appropriate contact time as indicated for disinfecting solutions.  Subjective:     Patient ID: Elizabeth Mcfarland , female    DOB: 1940-12-17 , 80 y.o.   MRN: [id number]   Chief Complaint  Patient presents with   Hypertension     HPI  Pt presents today for dm & htn F/U. She is concerned with fatigue and not being able to gain weight.  She has started back eating meats. May 2021 weight was 137. She has not been trying to lose. She has cut out her sweets.   Wt Readings from Last 3 Encounters: 12/04/20 : 129 lb 6.4 oz (58.7 kg) 10/10/20 : 129 lb 6.4 oz (58.7 kg) 10/03/20 : 126 lb 12.8 oz (57.5 kg)    Hypertension This is a chronic problem. The current episode started more than 1 year ago. The problem is unchanged. The problem is controlled. Pertinent negatives include no chest pain, headaches or palpitations. There are no associated agents to hypertension. Risk factors for coronary artery disease include post-menopausal state. Past treatments include ACE inhibitors. The current treatment provides significant improvement. There are no compliance problems.   Diabetes Associated symptoms include arthralgias and fatigue. Pertinent negatives include no chest pain or headaches.    Past Medical History:  Diagnosis Date   Allergy    Anemia    Blood transfusion without reported diagnosis    Breast cancer ([place]) 11/2018   Right breast invasive mammary carcinoma   Cancer ([place])    Cataract    Chronic kidney disease    Diabetes mellitus without  complication ([place])    Esophageal ulcer with bleeding 05/05/2013   05/05/2013 EGD linear distal esophageal ulcer - cause of hematemesis prior to EGD    GERD (gastroesophageal reflux disease)    History of kidney stones    Hyperlipidemia    Hypertension    Kidney cysts    Personal history of radiation therapy    Sarcoidosis 1970     Family History  Problem Relation Age of Onset   Early death Mother    Stroke Father    Diabetes Sister    Kidney failure Sister    Colon polyps Sister    Stroke Brother    Colon polyps Brother    Cancer Brother 80       prostate cancer   Heart disease Brother    Emphysema Brother    Cancer Brother 50       prostate cancer   Asthma Brother    CAD Maternal Grandmother    Breast cancer Neg Hx    Colon cancer Neg Hx    Esophageal cancer Neg Hx    Rectal cancer Neg Hx    Stomach cancer Neg Hx      Current Outpatient Medications:    Zoster Vaccine Adjuvanted (SHINGRIX) injection, Inject 0.5 mLs into the muscle once for 1 dose. Administer 2nd dose in 2-6 months  Please fax when each dose administered [phone], Disp: 0.5 mL, Rfl: 1   acetaminophen (TYLENOL) 500 MG tablet, Take 1,000 mg by mouth every 6 (six) hours  as needed for moderate pain or headache. , Disp: , Rfl:    amLODipine (NORVASC) 10 MG tablet, TAKE 1 TABLET BY MOUTH EVERY DAY, Disp: 90 tablet, Rfl: 1   anastrozole (ARIMIDEX) 1 MG tablet, TAKE 1 TABLET(1 MG) BY MOUTH DAILY, Disp: 30 tablet, Rfl: 3   Ascorbic Acid (VITAMIN C) 1000 MG tablet, Take 1,000 mg by mouth daily., Disp: , Rfl:    aspirin EC 81 MG tablet, Take 81 mg by mouth daily., Disp: , Rfl:    atorvastatin (LIPITOR) 20 MG tablet, TAKE 1 TABLET(20 MG) BY MOUTH AT BEDTIME, Disp: 90 tablet, Rfl: 1   cholecalciferol (VITAMIN D) 1000 UNITS tablet, Take 1,000 Units by mouth daily. , Disp: , Rfl:    glimepiride (AMARYL) 4 MG tablet, TAKE 1 TABLET BY MOUTH EVERY MORNING AND 1/2 TABLET AT BEDTIME, Disp: 135 tablet, Rfl: 1   JANUVIA 50  MG tablet, TAKE 1 TABLET(50 MG) BY MOUTH DAILY, Disp: 90 tablet, Rfl: 1   Multiple Vitamin (MULTIVITAMIN WITH MINERALS) TABS tablet, Take 1 tablet by mouth daily. , Disp: , Rfl:    olmesartan (BENICAR) 20 MG tablet, TAKE 1 TABLET(20 MG) BY MOUTH DAILY, Disp: 30 tablet, Rfl: 2   olmesartan (BENICAR) 40 MG tablet, Take 1 tablet (40 mg total) by mouth daily., Disp: 90 tablet, Rfl: 1   omeprazole (PRILOSEC) 20 MG capsule, TAKE 1 CAPSULE(20 MG) BY MOUTH DAILY, Disp: 90 capsule, Rfl: 1   ONETOUCH ULTRA test strip, USE AS DIRECTED TO CHECK BLOOD SUGAR TWICE DAILY, Disp: 100 strip, Rfl: 3   Polyethyl Glycol-Propyl Glycol (SYSTANE OP), Place 1 drop into both eyes 2 (two) times daily., Disp: , Rfl:    polyethylene glycol (MIRALAX / GLYCOLAX) packet, Take 17 g by mouth daily. , Disp: , Rfl:    Allergies  Allergen Reactions   Codeine Nausea And Vomiting   Percocet [Oxycodone-Acetaminophen] Nausea And Vomiting     Review of Systems  Constitutional:  Positive for fatigue.  Respiratory: Negative.    Cardiovascular: Negative.  Negative for chest pain and palpitations.  Endocrine: Negative for polydipsia, polyphagia and polyuria.  Musculoskeletal:  Positive for arthralgias.  Neurological: Negative.  Negative for dizziness and headaches.  Psychiatric/Behavioral: Negative.      Today's Vitals   12/04/20 1136  BP: 140/78  Pulse: 80  Temp: 98.3 F (36.8 C)  TempSrc: Oral  Weight: 129 lb 6.4 oz (58.7 kg)  Height: '5\' 4"'  (1.626 m)   Body mass index is 22.21 kg/m.  Wt Readings from Last 3 Encounters:  12/04/20 129 lb 6.4 oz (58.7 kg)  10/10/20 129 lb 6.4 oz (58.7 kg)  10/03/20 126 lb 12.8 oz (57.5 kg)    Objective:  Physical Exam Vitals reviewed.  Constitutional:      General: She is not in acute distress.    Appearance: Normal appearance. She is well-developed. She is obese.  HENT:     Right Ear: Hearing normal.     Left Ear: Hearing normal.  Eyes:     General: Lids are normal.      Funduscopic exam:    Right eye: No papilledema.        Left eye: No papilledema.  Neck:     Thyroid: No thyroid mass.     Vascular: No carotid bruit.  Cardiovascular:     Rate and Rhythm: Normal rate and regular rhythm.     Pulses: Normal pulses.     Heart sounds: Normal heart sounds. No murmur heard. Pulmonary:  Effort: Pulmonary effort is normal. No respiratory distress.     Breath sounds: Normal breath sounds. No wheezing.  Chest:     Chest wall: No mass.  Breasts:    Tanner Score is 5.     Right: Normal. No mass or tenderness.     Left: Normal. No mass or tenderness.  Genitourinary:    Rectum: Guaiac result negative.  Musculoskeletal:     Cervical back: Full passive range of motion without pain, normal range of motion and neck supple.  Lymphadenopathy:     Upper Body:     Right upper body: No supraclavicular, axillary or pectoral adenopathy.     Left upper body: No supraclavicular, axillary or pectoral adenopathy.  Skin:    General: Skin is warm and dry.     Capillary Refill: Capillary refill takes less than 2 seconds.  Neurological:     General: No focal deficit present.     Mental Status: She is alert and oriented to person, place, and time.     Cranial Nerves: No cranial nerve deficit.     Sensory: No sensory deficit.     Motor: No weakness.  Psychiatric:        Mood and Affect: Mood normal.        Behavior: Behavior normal.        Thought Content: Thought content normal.        Judgment: Judgment normal.        Assessment And Plan:     1. Essential hypertension B/P is fairly controlled.  CMP ordered to check renal function.  The importance of regular exercise and dietary modification was stressed to the patient.  - CMP14+EGFR  2. Type II diabetes mellitus with manifestations, uncontrolled (HCC) Chronic, slightly improved at last visit Continue with current medications Encouraged to limit intake of sugary foods and drinks Encouraged to increase  physical activity to 150 minutes per week - Hemoglobin A1c  3. Iron deficiency anemia secondary to inadequate dietary iron intake Will recheck iron levels due to the fatigue - Iron, TIBC and Ferritin Panel  4. Fatigue, unspecified type Will check for metabolic cause and refer to GI for further evaluation - CBC - Vitamin B12 - Ambulatory referral to Gastroenterology  5. Mixed hyperlipidemia Chronic, controlled Continue with current medications - Lipid panel  6. Encounter for immunization - Zoster Vaccine Adjuvanted [facility]) injection; Inject 0.5 mLs into the muscle once for 1 dose. Administer 2nd dose in 2-6 months  Please fax when each dose administered [phone]  Dispense: 0.5 mL; Refill: 1  7. Arthralgia of both hands - Autoimmune Profile - Sedimentation rate  8. Abnormal CT of the abdomen Has dilated pancreatic duct  - Ambulatory referral to Gastroenterology    Patient was given opportunity to ask questions. Patient verbalized understanding of the plan and was able to repeat key elements of the plan. All questions were answered to their satisfaction.  Elizabeth Brine, FNP   I, Elizabeth Brine, FNP, have reviewed all documentation for this visit. The documentation on 12/04/20 for the exam, diagnosis, procedures, and orders are all accurate and complete.   IF YOU HAVE BEEN REFERRED TO A SPECIALIST, IT MAY TAKE 1-2 WEEKS TO SCHEDULE/PROCESS THE REFERRAL. IF YOU HAVE NOT HEARD FROM US/SPECIALIST IN TWO WEEKS, PLEASE GIVE [place] A CALL AT [phone] X [phone].   THE PATIENT IS ENCOURAGED TO PRACTICE SOCIAL DISTANCING DUE TO THE COVID-19 PANDEMIC.

## 2020-12-05 LAB — CMP14+EGFR
ALT: 28 IU/L (ref 0–32)
AST: 26 IU/L (ref 0–40)
Albumin/Globulin Ratio: 2.3 — ABNORMAL HIGH (ref 1.2–2.2)
Albumin: 4.9 g/dL — ABNORMAL HIGH (ref 3.7–4.7)
Alkaline Phosphatase: 85 IU/L (ref 44–121)
BUN/Creatinine Ratio: 21 (ref 12–28)
BUN: 18 mg/dL (ref 8–27)
Bilirubin Total: 0.4 mg/dL (ref 0.0–1.2)
CO2: 27 mmol/L (ref 20–29)
Calcium: 10.2 mg/dL (ref 8.7–10.3)
Chloride: 100 mmol/L (ref 96–106)
Creatinine, Ser: 0.84 mg/dL (ref 0.57–1.00)
Globulin, Total: 2.1 g/dL (ref 1.5–4.5)
Glucose: 143 mg/dL — ABNORMAL HIGH (ref 65–99)
Potassium: 4.9 mmol/L (ref 3.5–5.2)
Sodium: 139 mmol/L (ref 134–144)
Total Protein: 7 g/dL (ref 6.0–8.5)
eGFR: 71 mL/min/{1.73_m2} (ref >59)

## 2020-12-05 LAB — AUTOIMMUNE PROFILE
Anti Nuclear Antibody (ANA): NEGATIVE
Complement C3, Serum: 130 mg/dL (ref 82–167)
dsDNA Ab: 1 IU/mL (ref 0–9)

## 2020-12-05 LAB — LIPID PANEL
Chol/HDL Ratio: 1.9 ratio (ref 0.0–4.4)
Cholesterol, Total: 199 mg/dL (ref 100–199)
HDL: 107 mg/dL (ref >39)
LDL Chol Calc (NIH): 86 mg/dL (ref 0–99)
Triglycerides: 29 mg/dL (ref 0–149)
VLDL Cholesterol Cal: 6 mg/dL (ref 5–40)

## 2020-12-05 LAB — CBC
Hematocrit: 37.1 % (ref 34.0–46.6)
Hemoglobin: 12.3 g/dL (ref 11.1–15.9)
MCH: 30.4 pg (ref 26.6–33.0)
MCHC: 33.2 g/dL (ref 31.5–35.7)
MCV: 92 fL (ref 79–97)
Platelets: 214 10*3/uL (ref 150–450)
RBC: 4.04 x10E6/uL (ref 3.77–5.28)
RDW: 11.8 % (ref 11.7–15.4)
WBC: 5.3 10*3/uL (ref 3.4–10.8)

## 2020-12-05 LAB — IRON,TIBC AND FERRITIN PANEL
Ferritin: 56 ng/mL (ref 15–150)
Iron Saturation: 19 % (ref 15–55)
Iron: 71 ug/dL (ref 27–139)
Total Iron Binding Capacity: 367 ug/dL (ref 250–450)
UIBC: 296 ug/dL (ref 118–369)

## 2020-12-05 LAB — HEMOGLOBIN A1C
Est. average glucose Bld gHb Est-mCnc: 131 mg/dL
Hgb A1c MFr Bld: 6.2 % — ABNORMAL HIGH (ref 4.8–5.6)

## 2020-12-05 LAB — SEDIMENTATION RATE: Sed Rate: 2 mm/hr (ref 0–40)

## 2020-12-05 LAB — VITAMIN B12: Vitamin B-12: >2000 pg/mL — ABNORMAL HIGH (ref 232–1245)

## 2020-12-06 ENCOUNTER — Telehealth: Payer: Self-pay

## 2020-12-06 NOTE — Telephone Encounter (Signed)
This nurse reached out to patient to follow up.  This nurse wanted to verify if patient was taking Vitamin D and Calcium tablets. Patient did not answer and this nurse unable to leave a message due to mailbox being full.  This nurse will also reach out to patient by My Chart.

## 2021-01-14 ENCOUNTER — Other Ambulatory Visit: Payer: Self-pay | Admitting: Nurse Practitioner

## 2021-02-05 DIAGNOSIS — H401131 Primary open-angle glaucoma, bilateral, mild stage: Secondary | ICD-10-CM | POA: Diagnosis not present

## 2021-02-05 LAB — HM DIABETES EYE EXAM

## 2021-02-06 ENCOUNTER — Ambulatory Visit (INDEPENDENT_AMBULATORY_CARE_PROVIDER_SITE_OTHER): Payer: Medicare Other | Admitting: Nurse Practitioner

## 2021-02-06 ENCOUNTER — Other Ambulatory Visit: Payer: Self-pay

## 2021-02-06 ENCOUNTER — Ambulatory Visit
Admission: RE | Admit: 2021-02-06 | Discharge: 2021-02-06 | Disposition: A | Discharge status: Home / Self Care | Payer: Medicare Other | Source: Ambulatory Visit | Attending: Nurse Practitioner | Admitting: Nurse Practitioner

## 2021-02-06 VITALS — BP 136/72 | HR 70 | Temp 99.0°F | Ht 62.0 in | Wt 131.4 lb

## 2021-02-06 DIAGNOSIS — R109 Unspecified abdominal pain: Secondary | ICD-10-CM | POA: Diagnosis not present

## 2021-02-06 DIAGNOSIS — Z862 Personal history of diseases of the blood and blood-forming organs and certain disorders involving the immune mechanism: Secondary | ICD-10-CM

## 2021-02-06 DIAGNOSIS — R5383 Other fatigue: Secondary | ICD-10-CM

## 2021-02-06 DIAGNOSIS — M545 Low back pain, unspecified: Secondary | ICD-10-CM | POA: Diagnosis not present

## 2021-02-06 DIAGNOSIS — R10A2 Flank pain, left side: Secondary | ICD-10-CM

## 2021-02-06 LAB — POCT URINALYSIS DIPSTICK
Bilirubin, UA: NEGATIVE
Blood, UA: NEGATIVE
Glucose, UA: NEGATIVE
Ketones, UA: NEGATIVE
Leukocytes, UA: NEGATIVE
Nitrite, UA: NEGATIVE
Protein, UA: NEGATIVE
Spec Grav, UA: 1.015 (ref 1.010–1.025)
Urobilinogen, UA: 0.2 E.U./dL
pH, UA: 7 (ref 5.0–8.0)

## 2021-02-06 NOTE — Progress Notes (Signed)
I,Tianna Badgett,acting as a Education administrator for [employer], FNP.,have documented all relevant documentation on the behalf of [name], FNP,as directed by  [name], FNP while in the presence of [name], [place].  This visit occurred during the SARS-CoV-2 public health emergency.  Safety protocols were in place, including screening questions prior to the visit, additional usage of staff PPE, and extensive cleaning of exam room while observing appropriate contact time as indicated for disinfecting solutions.  Subjective:     Patient ID: Elizabeth Mcfarland , female    DOB: 07/04/1940 , 80 y.o.   MRN: [id number]   Chief Complaint  Patient presents with   Back Pain    HPI  Patient is here for back and hip pain. She states that this is worse in the mornings.  She is having make herself stand up straight. The pain in her side is in the morning feels like a pressure.  She reports having Sarcoidosis over 50 years ago and Dr. Carlis Abbott would check her.   She is planning to go to see her kidney doctor.  She is due to see the Oncologist at the end of the month.   Back Pain Pertinent negatives include no chest pain.    Past Medical History:  Diagnosis Date   Allergy    Anemia    Blood transfusion without reported diagnosis    Breast cancer ([place]) 11/2018   Right breast invasive mammary carcinoma   Cancer ([place])    Cataract    Chronic kidney disease    Diabetes mellitus without complication ([place])    Esophageal ulcer with bleeding 05/05/2013   05/05/2013 EGD linear distal esophageal ulcer - cause of hematemesis prior to EGD    GERD (gastroesophageal reflux disease)    History of kidney stones    Hyperlipidemia    Hypertension    Kidney cysts    Personal history of radiation therapy    Sarcoidosis 1970     Family History  Problem Relation Age of Onset   Early death Mother    Stroke Father    Diabetes Sister    Kidney failure Sister    Colon polyps Sister    Stroke Brother    Colon  polyps Brother    Cancer Brother 79       prostate cancer   Heart disease Brother    Emphysema Brother    Cancer Brother 85       prostate cancer   Asthma Brother    CAD Maternal Grandmother    Breast cancer Neg Hx    Colon cancer Neg Hx    Esophageal cancer Neg Hx    Rectal cancer Neg Hx    Stomach cancer Neg Hx      Current Outpatient Medications:    acetaminophen (TYLENOL) 500 MG tablet, Take 1,000 mg by mouth every 6 (six) hours as needed for moderate pain or headache. , Disp: , Rfl:    amLODipine (NORVASC) 10 MG tablet, TAKE 1 TABLET BY MOUTH EVERY DAY, Disp: 90 tablet, Rfl: 1   anastrozole (ARIMIDEX) 1 MG tablet, TAKE 1 TABLET(1 MG) BY MOUTH DAILY, Disp: 30 tablet, Rfl: 3   Ascorbic Acid (VITAMIN C) 1000 MG tablet, Take 1,000 mg by mouth daily., Disp: , Rfl:    aspirin EC 81 MG tablet, Take 81 mg by mouth daily., Disp: , Rfl:    atorvastatin (LIPITOR) 20 MG tablet, TAKE 1 TABLET(20 MG) BY MOUTH AT BEDTIME, Disp: 90 tablet, Rfl: 1  cholecalciferol (VITAMIN D) 1000 UNITS tablet, Take 1,000 Units by mouth daily. , Disp: , Rfl:    glimepiride (AMARYL) 4 MG tablet, TAKE 1 TABLET BY MOUTH EVERY MORNING AND 1/2 TABLET AT BEDTIME, Disp: 135 tablet, Rfl: 1   JANUVIA 50 MG tablet, TAKE 1 TABLET(50 MG) BY MOUTH DAILY, Disp: 90 tablet, Rfl: 1   Multiple Vitamin (MULTIVITAMIN WITH MINERALS) TABS tablet, Take 1 tablet by mouth daily. , Disp: , Rfl:    olmesartan (BENICAR) 20 MG tablet, TAKE 1 TABLET(20 MG) BY MOUTH DAILY, Disp: 30 tablet, Rfl: 2   olmesartan (BENICAR) 40 MG tablet, Take 1 tablet (40 mg total) by mouth daily., Disp: 90 tablet, Rfl: 1   omeprazole (PRILOSEC) 20 MG capsule, TAKE 1 CAPSULE(20 MG) BY MOUTH DAILY, Disp: 90 capsule, Rfl: 1   ONETOUCH ULTRA test strip, USE AS DIRECTED TWICE DAILY, Disp: 100 strip, Rfl: 3   Polyethyl Glycol-Propyl Glycol (SYSTANE OP), Place 1 drop into both eyes 2 (two) times daily., Disp: , Rfl:    polyethylene glycol (MIRALAX / GLYCOLAX) packet,  Take 17 g by mouth daily. , Disp: , Rfl:    Allergies  Allergen Reactions   Codeine Nausea And Vomiting   Percocet [Oxycodone-Acetaminophen] Nausea And Vomiting     Review of Systems  Constitutional:  Positive for fatigue.  Respiratory: Negative.    Cardiovascular: Negative.  Negative for chest pain, palpitations and leg swelling.  Gastrointestinal: Negative.   Musculoskeletal:  Positive for back pain.  Neurological: Negative.   Psychiatric/Behavioral: Negative.      Today's Vitals   02/06/21 1123  BP: 136/72  Pulse: 70  Temp: 99 F (37.2 C)  TempSrc: Oral  Weight: 131 lb 6.4 oz (59.6 kg)  Height: 5\' 2"  (1.575 m)   Body mass index is 24.03 kg/m.   Objective:  Physical Exam Vitals reviewed.  Constitutional:      General: She is not in acute distress.    Appearance: Normal appearance. She is well-developed. She is obese.  HENT:     Right Ear: Hearing normal.     Left Ear: Hearing normal.  Eyes:     General: Lids are normal.     Funduscopic exam:    Right eye: No papilledema.        Left eye: No papilledema.  Neck:     Thyroid: No thyroid mass.     Vascular: No carotid bruit.  Cardiovascular:     Rate and Rhythm: Normal rate and regular rhythm.     Pulses: Normal pulses.     Heart sounds: Normal heart sounds. No murmur heard. Pulmonary:     Effort: Pulmonary effort is normal. No respiratory distress.     Breath sounds: Normal breath sounds. No wheezing.  Abdominal:     General: There is no distension.     Tenderness: There is no abdominal tenderness. There is no right CVA tenderness or left CVA tenderness.  Genitourinary:    Rectum: Guaiac result negative.  Musculoskeletal:        General: No swelling or tenderness.     Cervical back: Full passive range of motion without pain, normal range of motion and neck supple.  Lymphadenopathy:     Upper Body:     Right upper body: No supraclavicular, axillary or pectoral adenopathy.     Left upper body: No  supraclavicular, axillary or pectoral adenopathy.  Skin:    General: Skin is warm and dry.     Capillary Refill: Capillary refill takes  less than 2 seconds.  Neurological:     General: No focal deficit present.     Mental Status: She is alert and oriented to person, place, and time.     Cranial Nerves: No cranial nerve deficit.     Sensory: No sensory deficit.     Motor: No weakness.  Psychiatric:        Mood and Affect: Mood normal.        Behavior: Behavior normal.        Thought Content: Thought content normal.        Judgment: Judgment normal.        Assessment And Plan:     1. Acute bilateral low back pain without sciatica Comments: She would like to try tylenol first and will get the xray. May be related to arthritis, improves over time - DG Lumbar Spine Complete; Future  2. Fatigue, unspecified type Comments: I have checked for metabolic cause which were all normal. I don't know if her fatigue is related to deconditioning.  - DG Chest 2 View; Future  3. History of sarcoidosis Comments: Will check chest xray due to history of Sarcoidosis - DG Chest 2 View; Future  4. Left flank pain Comments: urinalysis is normal  - POCT Urinalysis Dipstick ([id number])     Patient was given opportunity to ask questions. Patient verbalized understanding of the plan and was able to repeat key elements of the plan. All questions were answered to their satisfaction.  [name], FNP    I, [name], FNP, have reviewed all documentation for this visit. The documentation on 02/06/21 for the exam, diagnosis, procedures, and orders are all accurate and complete.   IF YOU HAVE BEEN REFERRED TO A SPECIALIST, IT MAY TAKE 1-2 WEEKS TO SCHEDULE/PROCESS THE REFERRAL. IF YOU HAVE NOT HEARD FROM US/SPECIALIST IN TWO WEEKS, PLEASE GIVE [place] A CALL AT [phone] X [phone].   THE PATIENT IS ENCOURAGED TO PRACTICE SOCIAL DISTANCING DUE TO THE COVID-19 PANDEMIC.

## 2021-02-06 NOTE — Patient Instructions (Signed)

## 2021-02-13 ENCOUNTER — Other Ambulatory Visit: Payer: Self-pay | Admitting: Nurse Practitioner

## 2021-02-13 DIAGNOSIS — M545 Low back pain, unspecified: Secondary | ICD-10-CM

## 2021-02-13 DIAGNOSIS — R5383 Other fatigue: Secondary | ICD-10-CM

## 2021-02-20 DIAGNOSIS — C50411 Malignant neoplasm of upper-outer quadrant of right female breast: Secondary | ICD-10-CM | POA: Diagnosis not present

## 2021-02-20 DIAGNOSIS — Z170 Estrogen receptor positive status [ER+]: Secondary | ICD-10-CM | POA: Diagnosis not present

## 2021-02-24 ENCOUNTER — Encounter: Payer: Self-pay | Admitting: Nurse Practitioner

## 2021-02-28 ENCOUNTER — Other Ambulatory Visit: Payer: Self-pay | Admitting: Nurse Practitioner

## 2021-02-28 DIAGNOSIS — I700 Atherosclerosis of aorta: Secondary | ICD-10-CM

## 2021-03-04 ENCOUNTER — Other Ambulatory Visit: Payer: Self-pay | Admitting: Nurse Practitioner

## 2021-03-04 ENCOUNTER — Other Ambulatory Visit: Payer: Self-pay | Admitting: Hematology

## 2021-03-04 DIAGNOSIS — I10 Essential (primary) hypertension: Secondary | ICD-10-CM

## 2021-03-08 ENCOUNTER — Ambulatory Visit: Payer: Medicare Other | Admitting: Cardiovascular Disease

## 2021-03-08 ENCOUNTER — Other Ambulatory Visit: Payer: Self-pay

## 2021-03-08 ENCOUNTER — Encounter: Payer: Self-pay | Admitting: Cardiovascular Disease

## 2021-03-08 VITALS — BP 136/72 | HR 61 | Ht 62.0 in | Wt 129.2 lb

## 2021-03-08 DIAGNOSIS — E7800 Pure hypercholesterolemia, unspecified: Secondary | ICD-10-CM

## 2021-03-08 DIAGNOSIS — I700 Atherosclerosis of aorta: Secondary | ICD-10-CM

## 2021-03-08 DIAGNOSIS — I2510 Atherosclerotic heart disease of native coronary artery without angina pectoris: Secondary | ICD-10-CM

## 2021-03-08 DIAGNOSIS — I10 Essential (primary) hypertension: Secondary | ICD-10-CM

## 2021-03-08 DIAGNOSIS — E119 Type 2 diabetes mellitus without complications: Secondary | ICD-10-CM

## 2021-03-08 DIAGNOSIS — R0602 Shortness of breath: Secondary | ICD-10-CM | POA: Diagnosis not present

## 2021-03-08 DIAGNOSIS — Z853 Personal history of malignant neoplasm of breast: Secondary | ICD-10-CM

## 2021-03-08 MED ORDER — METOPROLOL TARTRATE 25 MG PO TABS: ORAL_TABLET | ORAL | 0 refills | Status: DC

## 2021-03-08 NOTE — Progress Notes (Signed)
Cardiology Office Note:    Date:  03/08/2021   ID:  Fayne Mcguffee, [place] 1940/06/16, MRN [id number]  PCP:  [name], FNP   [facility] Providers Cardiologist:  [name], MD     Referring MD: [name], FNP   No chief complaint on file. Elizabeth Mcfarland is a 80 y.o. female who is being seen today for the evaluation of dyspnea at the request of [name], [place].   History of Present Illness:    Elizabeth Mcfarland is a 80 y.o. female with a hx of well-controlled hypertension and type 2 diabetes mellitus, recent right breast lumpectomy and radiation therapy in 2020 (no chemotherapy), history of nephrolithiasis, remote history of distal esophageal ulcer causing hematemesis in 2015, presenting with complaints of shortness of breath.  She is always been very active, going to exercise at the [facility] and is an avid gardener.  She reports that in the spring she was able to carry 40 bags of mulch without difficulty.  Over the last 3 months she has noticed increasing shortness of breath and fatigue.  She becomes easily exhausted and feels that she cannot catch her breath with physical activity.  This never occurs at rest.  She denies orthopnea, PND or lower extremity edema.  She does not have chest pain either at rest or with activity.  She does not have palpitations dizziness lightheadedness and has never experienced syncope near she is noticed that her heart rate is always around 60 bpm.  Cough, wheezing, hemoptysis.  She had an episode of hematemesis in 2015, none since.  In 2017 she underwent an echocardiogram and a pharmacological nuclear myocardial perfusion test.  The perfusion images were normal.  LVEF was normal and there were no major valvular abnormalities.  Doppler measurements suggested "grade 1 diastolic dysfunction".  On a CT of the abdomen and pelvis about a year ago there was evidence of advanced calcification in the abdominal aorta and in the coronary  arteries.  Labs performed in September showed a normal hemoglobin of 12.3, negative screening for autoimmune disorders, normal ESR at 2, normal renal and liver tests.  Her most recent lipid profile showed a total cholesterol 199, excellent HDL at 107, LDL 86 and triglycerides 29.  She has never smoked.  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  He is originally from [place], [place]  Past Medical History:  Diagnosis Date   Allergy    Anemia    Blood transfusion without reported diagnosis    Breast cancer ([place]) 11/2018   Right breast invasive mammary carcinoma   Cancer ([place])    Cataract    Chronic kidney disease    Diabetes mellitus without complication ([place])    Esophageal ulcer with bleeding 05/05/2013   05/05/2013 EGD linear distal esophageal ulcer - cause of hematemesis prior to EGD    GERD (gastroesophageal reflux disease)    History of kidney stones    Hyperlipidemia    Hypertension    Kidney cysts    Personal history of radiation therapy    Sarcoidosis 1970    Past Surgical History:  Procedure Laterality Date   ABDOMINAL HYSTERECTOMY     BREAST EXCISIONAL BIOPSY Left 11/2018   Fibroadenoma   BREAST LUMPECTOMY     BREAST LUMPECTOMY WITH RADIOACTIVE SEED AND SENTINEL LYMPH NODE BIOPSY Bilateral 11/25/2018   Procedure: BILATERAL BREAST LUMPECTOMIES  WITH BILATERAL  RADIOACTIVE SEEDS AND RIGHT SENTINEL LYMPH NODE BIOPSY;  Surgeon:  [name], MD;  Location: [facility];  Service: General;  Laterality: Bilateral;   CATARACT EXTRACTION W/ INTRAOCULAR LENS IMPLANT     COLONOSCOPY     COLONOSCOPY, ESOPHAGOGASTRODUODENOSCOPY (EGD) AND ESOPHAGEAL DILATION N/A 05/05/2013   Procedure: COLONOSCOPY, ESOPHAGOGASTRODUODENOSCOPY (EGD) AND ESOPHAGEAL DILATION (ED);  Surgeon: Gatha Mayer, MD;  Location: [facility];  Service: Endoscopy;  Laterality: N/A;   LITHOTRIPSY     POLYPECTOMY     UPPER GASTROINTESTINAL ENDOSCOPY      Current  Medications: Current Meds  Medication Sig   acetaminophen (TYLENOL) 500 MG tablet Take 1,000 mg by mouth every 6 (six) hours as needed for moderate pain or headache.    amLODipine (NORVASC) 10 MG tablet TAKE 1 TABLET BY MOUTH EVERY DAY   anastrozole (ARIMIDEX) 1 MG tablet TAKE 1 TABLET(1 MG) BY MOUTH DAILY   Ascorbic Acid (VITAMIN C) 1000 MG tablet Take 1,000 mg by mouth daily.   aspirin EC 81 MG tablet Take 81 mg by mouth daily.   atorvastatin (LIPITOR) 20 MG tablet TAKE 1 TABLET(20 MG) BY MOUTH AT BEDTIME   cholecalciferol (VITAMIN D) 1000 UNITS tablet Take 1,000 Units by mouth daily.    glimepiride (AMARYL) 4 MG tablet TAKE 1 TABLET BY MOUTH EVERY MORNING AND 1/2 TABLET AT BEDTIME   metoprolol tartrate (LOPRESSOR) 25 MG tablet Take one tablet two hours before the test   Multiple Vitamin (MULTIVITAMIN WITH MINERALS) TABS tablet Take 1 tablet by mouth daily.    olmesartan (BENICAR) 20 MG tablet TAKE 1 TABLET(20 MG) BY MOUTH DAILY   olmesartan (BENICAR) 40 MG tablet Take 1 tablet (40 mg total) by mouth daily.   omeprazole (PRILOSEC) 20 MG capsule TAKE 1 CAPSULE(20 MG) BY MOUTH DAILY   ONETOUCH ULTRA test strip USE AS DIRECTED TWICE DAILY   Polyethyl Glycol-Propyl Glycol (SYSTANE OP) Place 1 drop into both eyes 2 (two) times daily.   polyethylene glycol (MIRALAX / GLYCOLAX) packet Take 17 g by mouth daily.      Allergies:   Codeine and Percocet [oxycodone-acetaminophen]   Social History   Socioeconomic History   Marital status: Single    Spouse name: Not on file   Number of children: 1   Years of education: Not on file   Highest education level: GED or equivalent  Occupational History   Occupation: Physiological scientist: SELF-EMPLOYED   Occupation: retired  Tobacco Use   Smoking status: Never   Smokeless tobacco: Never  Scientific laboratory technician Use: Never used  Substance and Sexual Activity   Alcohol use: No   Drug use: No   Sexual activity: Not Currently  Other Topics Concern    Not on file  Social History Narrative   Lives alone   Social Determinants of Health   Financial Resource Strain: Low Risk    Difficulty of Paying Living Expenses: Not hard at all  Food Insecurity: No Food Insecurity   Worried About Charity fundraiser in the Last Year: Never true   Arboriculturist in the Last Year: Never true  Transportation Needs: No Transportation Needs   Lack of Transportation (Medical): No   Lack of Transportation (Non-Medical): No  Physical Activity: Inactive   Days of Exercise per Week: 0 days   Minutes of Exercise per Session: 0 min  Stress: No Stress Concern Present   Feeling of Stress : Not at all  Social Connections: Not on file     Family History: The patient's  family history includes Asthma in her brother; CAD in her maternal grandmother; Cancer (age of onset: 84) in her brother and brother; Colon polyps in her brother and sister; Diabetes in her sister; Early death in her mother; Emphysema in her brother; Heart disease in her brother; Kidney failure in her sister; Stroke in her brother and father. There is no history of Breast cancer, Colon cancer, Esophageal cancer, Rectal cancer, or Stomach cancer.  ROS:   Please see the history of present illness.     All other systems reviewed and are negative.  EKGs/Labs/Other Studies Reviewed:    The following studies were reviewed today: 01/04/2016 nuclear stress test Nuclear stress EF: 67%. There was no ST segment deviation noted during stress. The study is normal. This is a low risk study.   Low risk stress nuclear study with normal perfusion and normal left ventricular regional and global systolic function.  01/04/2016 echocardiogram - Left ventricle: The cavity size was normal. Wall thickness was    normal. Systolic function was vigorous. The estimated ejection    fraction was in the range of 65% to 70%. Wall motion was normal;    there were no regional wall motion abnormalities. Doppler     parameters are consistent with abnormal left ventricular    relaxation (grade 1 diastolic dysfunction).     EKG:  EKG is  ordered today.  The ekg ordered today demonstrates normal sinus rhythm, very mild nonspecific T wave flattening in the lateral leads, overall normal tracing, QTC 404 ms  Recent Labs: 05/31/2020: TSH 1.150 12/04/2020: ALT 28; BUN 18; Creatinine, Ser 0.84; Hemoglobin 12.3; Platelets 214; Potassium 4.9; Sodium 139  Recent Lipid Panel    Component Value Date/Time   CHOL 199 12/04/2020 1224   TRIG 29 12/04/2020 1224   HDL 107 12/04/2020 1224   CHOLHDL 1.9 12/04/2020 1224   CHOLHDL 2.8 12/24/2015 0306   VLDL 12 12/24/2015 0306   LDLCALC 86 12/04/2020 1224     Risk Assessment/Calculations:           Physical Exam:    VS:  BP 136/72 (BP Location: Left Arm, Patient Position: Sitting, Cuff Size: Normal)    Pulse 61    Ht _0  (1.575 m)    Wt 129 lb 3.2 oz (58.6 kg)    SpO2 96%    BMI 23.63 kg/m     Wt Readings from Last 3 Encounters:  03/08/21 129 lb 3.2 oz (58.6 kg)  02/06/21 131 lb 6.4 oz (59.6 kg)  12/04/20 129 lb 6.4 oz (58.7 kg)     GEN:  Well nourished, well developed in no acute distress HEENT: Normal NECK: No JVD; No carotid bruits LYMPHATICS: No lymphadenopathy CARDIAC: RRR, no murmurs, rubs, gallops RESPIRATORY:  Clear to auscultation without rales, wheezing or rhonchi  ABDOMEN: Soft, non-tender, non-distended MUSCULOSKELETAL:  No edema; No deformity  SKIN: Warm and dry NEUROLOGIC:  Alert and oriented x 3 PSYCHIATRIC:  Normal affect   ASSESSMENT:    1. Shortness of breath   2. Coronary artery calcification seen on CT scan   3. Aortic atherosclerosis ([place])   4. Diabetes mellitus type 2 in nonobese (HCC)   5. Essential hypertension   6. Hypercholesterolemia   7. History of breast cancer    PLAN:    In order of problems listed above:  Dyspnea on exertion: Check echocardiogram.  No overt evidence of hypervolemia on physical  exam. Coronary artery calcifications: And aortic atherosclerosis seen prominently on imaging studies.  Check coronary CT angiography in case her dyspnea is a manifestation of ischemia. Consider chronotropic incompetence as a cause of her exertional dyspnea and fatigue.  If the echo and CT angiogram are unremarkable, would recommend a 72-hour arrhythmia monitor. HTN: Excellent control. DM: Despite very lean body habitus, she has diabetes, but this is very well controlled.  Consider switching from glimepiride to SGLT2 inhibitor if we identify evidence of heart failure. HLP: Although her LDL cholesterol would be preferably less than 70, she has an absolutely outstanding HDL cholesterol.  We will tailor lipid-lowering therapy based on findings on coronary CTA. History of breast cancer status post XRT           Medication Adjustments/Labs and Tests Ordered: Current medicines are reviewed at length with the patient today.  Concerns regarding medicines are outlined above.  Orders Placed This Encounter  Procedures   CT CORONARY MORPH W/CTA COR W/SCORE W/CA W/CM &/OR WO/CM   Basic metabolic panel   EKG [id number]   ECHOCARDIOGRAM COMPLETE   Meds ordered this encounter  Medications   metoprolol tartrate (LOPRESSOR) 25 MG tablet    Sig: Take one tablet two hours before the test    Dispense:  1 tablet    Refill:  0    Patient Instructions  Medication Instructions:  No changes *If you need a refill on your cardiac medications before your next appointment, please call your pharmacy*   Lab Work: None ordered If you have labs (blood work) drawn today and your tests are completely normal, you will receive your results only by: MyChart Message (if you have MyChart) OR A paper copy in the mail If you have any lab test that is abnormal or we need to change your treatment, we will call you to review the results.   Testing/Procedures: Your physician has requested that you have an echocardiogram.  Echocardiography is a painless test that uses sound waves to create images of your heart. It provides your doctor with information about the size and shape of your heart and how well your hearts chambers and valves are working. You may receive an ultrasound enhancing agent through an IV if needed to better visualize your heart during the echo.This procedure takes approximately one hour. There are no restrictions for this procedure. This will take place at the [address], [address].   Follow-Up: At [facility], you and your health needs are our priority.  As part of our continuing mission to provide you with exceptional heart care, we have created designated Provider Care Teams.  These Care Teams include your primary Cardiologist (physician) and Advanced Practice Providers (APPs -  Physician Assistants and Nurse Practitioners) who all work together to provide you with the care you need, when you need it.  We recommend signing up for the patient portal called "MyChart".  Sign up information is provided on this After Visit Summary.  MyChart is used to connect with patients for Virtual Visits (Telemedicine).  Patients are able to view lab/test results, encounter notes, upcoming appointments, etc.  Non-urgent messages can be sent to your provider as well.   To learn more about what you can do with MyChart, go to [email or web address].    Your next appointment:   3 month(s)  The format for your next appointment:   In Person  Provider:   [name], MD   Other Instructions   Your cardiac CT will be scheduled at one of the below locations:   [facility] [address]  [place], [place] [address] [phone]  [place] [address] [place], [place] [address] [phone]  If scheduled at [facility], please arrive at the [facility] main entrance (entrance A) of [facility] 30 minutes prior  to test start time. You can use the FREE valet parking offered at the main entrance (encouraged to control the heart rate for the test) Proceed to the [facility] Radiology Department (first floor) to check-in and test prep.  If scheduled at [facility], please arrive 15 mins early for check-in and test prep.  Please follow these instructions carefully (unless otherwise directed):   On the Night Before the Test: Be sure to Drink plenty of water. Do not consume any caffeinated/decaffeinated beverages or chocolate 12 hours prior to your test. Do not take any antihistamines 12 hours prior to your test.  On the Day of the Test: Drink plenty of water until 1 hour prior to the test. Do not eat any food 4 hours prior to the test. You may take your regular medications prior to the test.  Take metoprolol (Lopressor) two hours prior to test. FEMALES- please wear underwire-free bra if available, avoid dresses & tight clothing Hold the Olmesartan the morning of the test       After the Test: Drink plenty of water. After receiving IV contrast, you may experience a mild flushed feeling. This is normal. On occasion, you may experience a mild rash up to 24 hours after the test. This is not dangerous. If this occurs, you can take Benadryl 25 mg and increase your fluid intake. If you experience trouble breathing, this can be serious. If it is severe call 911 IMMEDIATELY. If it is mild, please call our office. If you take any of these medications: Glipizide/Metformin, Avandament, Glucavance, please do not take 48 hours after completing test unless otherwise instructed.  Please allow 2-4 weeks for scheduling of routine cardiac CTs. Some insurance companies require a pre-authorization which may delay scheduling of this test.   For non-scheduling related questions, please contact the cardiac imaging nurse navigator should you have any questions/concerns: Marchia Bond, Cardiac  Imaging Nurse Navigator Gordy Clement, Cardiac Imaging Nurse Navigator [facility] Direct Office Dial: [phone]   For scheduling needs, including cancellations and rescheduling, please call [place], [phone].    Signed, [name], MD  03/08/2021 8:44 AM    [facility]

## 2021-03-08 NOTE — Patient Instructions (Addendum)
Medication Instructions:  No changes *If you need a refill on your cardiac medications before your next appointment, please call your pharmacy*   Lab Work: None ordered If you have labs (blood work) drawn today and your tests are completely normal, you will receive your results only by: [place] (if you have MyChart) OR A paper copy in the mail If you have any lab test that is abnormal or we need to change your treatment, we will call you to review the results.   Testing/Procedures: Your physician has requested that you have an echocardiogram. Echocardiography is a painless test that uses sound waves to create images of your heart. It provides your doctor with information about the size and shape of your heart and how well your hearts chambers and valves are working. You may receive an ultrasound enhancing agent through an IV if needed to better visualize your heart during the echo.This procedure takes approximately one hour. There are no restrictions for this procedure. This will take place at the [address].   Follow-Up: At [facility], you and your health needs are our priority.  As part of our continuing mission to provide you with exceptional heart care, we have created designated Provider Care Teams.  These Care Teams include your primary Cardiologist (physician) and Advanced Practice Providers (APPs -  Physician Assistants and Nurse Practitioners) who all work together to provide you with the care you need, when you need it.  We recommend signing up for the patient portal called "MyChart".  Sign up information is provided on this After Visit Summary.  MyChart is used to connect with patients for Virtual Visits (Telemedicine).  Patients are able to view lab/test results, encounter notes, upcoming appointments, etc.  Non-urgent messages can be sent to your provider as well.   To learn more about what you can do with MyChart, go to [email or web address].     Your next appointment:   3 month(s)  The format for your next appointment:   In Person  Provider:   [name], MD   Other Instructions   Your cardiac CT will be scheduled at one of the below locations:   [facility] [address] [place], [place] [address] [phone]  [place] [address] [place], [place] [address] [phone]  If scheduled at [facility], please arrive at the [facility] main entrance (entrance A) of [facility] 30 minutes prior to test start time. You can use the FREE valet parking offered at the main entrance (encouraged to control the heart rate for the test) Proceed to the [facility] Radiology Department (first floor) to check-in and test prep.  If scheduled at [facility], please arrive 15 mins early for check-in and test prep.  Please follow these instructions carefully (unless otherwise directed):   On the Night Before the Test: Be sure to Drink plenty of water. Do not consume any caffeinated/decaffeinated beverages or chocolate 12 hours prior to your test. Do not take any antihistamines 12 hours prior to your test.  On the Day of the Test: Drink plenty of water until 1 hour prior to the test. Do not eat any food 4 hours prior to the test. You may take your regular medications prior to the test.  Take metoprolol (Lopressor) two hours prior to test. FEMALES- please wear underwire-free bra if available, avoid dresses & tight clothing Hold the Olmesartan the morning  of the test       After the Test: Drink plenty of water. After receiving IV contrast, you may experience a mild flushed feeling. This is normal. On occasion, you may experience a mild rash up to 24 hours after the test. This is not dangerous. If this occurs, you can take Benadryl 25 mg and increase your fluid intake. If you experience trouble breathing, this  can be serious. If it is severe call 911 IMMEDIATELY. If it is mild, please call our office. If you take any of these medications: Glipizide/Metformin, Avandament, Glucavance, please do not take 48 hours after completing test unless otherwise instructed.  Please allow 2-4 weeks for scheduling of routine cardiac CTs. Some insurance companies require a pre-authorization which may delay scheduling of this test.   For non-scheduling related questions, please contact the cardiac imaging nurse navigator should you have any questions/concerns: Marchia Bond, Cardiac Imaging Nurse Navigator Gordy Clement, Cardiac Imaging Nurse Navigator [place] [facility] Direct Office Dial: [phone]   For scheduling needs, including cancellations and rescheduling, please call [place], [phone].

## 2021-03-19 ENCOUNTER — Other Ambulatory Visit: Payer: Self-pay | Admitting: Nurse Practitioner

## 2021-03-19 DIAGNOSIS — I10 Essential (primary) hypertension: Secondary | ICD-10-CM

## 2021-03-21 ENCOUNTER — Telehealth (HOSPITAL_COMMUNITY): Payer: Self-pay | Admitting: *Deleted

## 2021-03-21 NOTE — Telephone Encounter (Signed)
Attempted to call patient regarding upcoming cardiac CT appointment and to remind her to obtain blood work prior to appointment. Left message on voicemail with name and callback number  Gordy Clement RN Navigator Cardiac [facility] [phone] Office [phone] Cell

## 2021-03-22 ENCOUNTER — Telehealth (HOSPITAL_COMMUNITY): Payer: Self-pay | Admitting: *Deleted

## 2021-03-22 LAB — BASIC METABOLIC PANEL
BUN/Creatinine Ratio: 14 (ref 12–28)
BUN: 11 mg/dL (ref 8–27)
CO2: 27 mmol/L (ref 20–29)
Calcium: 9.6 mg/dL (ref 8.7–10.3)
Chloride: 97 mmol/L (ref 96–106)
Creatinine, Ser: 0.76 mg/dL (ref 0.57–1.00)
Glucose: 266 mg/dL — ABNORMAL HIGH (ref 70–99)
Potassium: 4.2 mmol/L (ref 3.5–5.2)
Sodium: 136 mmol/L (ref 134–144)
eGFR: 80 mL/min/{1.73_m2} (ref >59)

## 2021-03-22 NOTE — Telephone Encounter (Signed)
Reaching out to patient to offer assistance regarding upcoming cardiac imaging study; pt verbalizes understanding of appt date/time, parking situation and where to check in, pre-test NPO status and medications ordered, and verified current allergies; name and call back number provided for further questions should they arise  Elizabeth Clement RN Navigator Cardiac Imaging [facility] [phone] office [phone] cell  Patient to take 25mg  metoprolol tartrate two hours prior to cardiac CT scan. She is aware to arrive at 9:30am for her 10am scan.

## 2021-03-26 ENCOUNTER — Other Ambulatory Visit: Payer: Self-pay

## 2021-03-26 ENCOUNTER — Ambulatory Visit (HOSPITAL_COMMUNITY)
Admission: RE | Admit: 2021-03-26 | Discharge: 2021-03-26 | Disposition: A | Discharge status: Home / Self Care | Payer: Medicare Other | Source: Ambulatory Visit | Attending: Internal Medicine | Admitting: Internal Medicine

## 2021-03-26 ENCOUNTER — Ambulatory Visit (HOSPITAL_COMMUNITY)
Admission: RE | Admit: 2021-03-26 | Discharge: 2021-03-26 | Disposition: A | Discharge status: Home / Self Care | Payer: Medicare Other | Source: Ambulatory Visit | Attending: Cardiovascular Disease | Admitting: Cardiovascular Disease

## 2021-03-26 DIAGNOSIS — R931 Abnormal findings on diagnostic imaging of heart and coronary circulation: Secondary | ICD-10-CM

## 2021-03-26 DIAGNOSIS — I700 Atherosclerosis of aorta: Secondary | ICD-10-CM | POA: Diagnosis not present

## 2021-03-26 DIAGNOSIS — K449 Diaphragmatic hernia without obstruction or gangrene: Secondary | ICD-10-CM | POA: Insufficient documentation

## 2021-03-26 DIAGNOSIS — I2510 Atherosclerotic heart disease of native coronary artery without angina pectoris: Secondary | ICD-10-CM | POA: Insufficient documentation

## 2021-03-26 DIAGNOSIS — R0602 Shortness of breath: Secondary | ICD-10-CM | POA: Insufficient documentation

## 2021-03-26 MED ORDER — NITROGLYCERIN 0.4 MG SL SUBL
0.8000 mg | SUBLINGUAL_TABLET | Freq: Once | SUBLINGUAL | Status: AC
  Administered 2021-03-26: 0.8 mg via SUBLINGUAL

## 2021-03-26 MED ORDER — IOHEXOL 350 MG/ML SOLN
95.0000 mL | Freq: Once | INTRAVENOUS | Status: AC | PRN
  Administered 2021-03-26: 95 mL via INTRAVENOUS

## 2021-03-26 MED ORDER — NITROGLYCERIN 0.4 MG SL SUBL
SUBLINGUAL_TABLET | SUBLINGUAL | Status: AC
  Filled 2021-03-26: qty 2

## 2021-04-01 ENCOUNTER — Ambulatory Visit: Payer: Medicare Other | Admitting: Cardiovascular Disease

## 2021-04-01 ENCOUNTER — Encounter: Payer: Self-pay | Admitting: Cardiovascular Disease

## 2021-04-01 ENCOUNTER — Other Ambulatory Visit: Payer: Self-pay

## 2021-04-01 VITALS — BP 138/72 | HR 62 | Ht 62.0 in | Wt 128.4 lb

## 2021-04-01 DIAGNOSIS — E7800 Pure hypercholesterolemia, unspecified: Secondary | ICD-10-CM

## 2021-04-01 DIAGNOSIS — I700 Atherosclerosis of aorta: Secondary | ICD-10-CM

## 2021-04-01 DIAGNOSIS — E119 Type 2 diabetes mellitus without complications: Secondary | ICD-10-CM | POA: Diagnosis not present

## 2021-04-01 DIAGNOSIS — I25118 Atherosclerotic heart disease of native coronary artery with other forms of angina pectoris: Secondary | ICD-10-CM

## 2021-04-01 DIAGNOSIS — Z853 Personal history of malignant neoplasm of breast: Secondary | ICD-10-CM | POA: Diagnosis not present

## 2021-04-01 DIAGNOSIS — R0609 Other forms of dyspnea: Secondary | ICD-10-CM

## 2021-04-01 DIAGNOSIS — I10 Essential (primary) hypertension: Secondary | ICD-10-CM | POA: Diagnosis not present

## 2021-04-01 DIAGNOSIS — I2510 Atherosclerotic heart disease of native coronary artery without angina pectoris: Secondary | ICD-10-CM | POA: Diagnosis not present

## 2021-04-01 MED ORDER — ATORVASTATIN CALCIUM 80 MG PO TABS: 80.0000 mg | ORAL_TABLET | Freq: Every day | ORAL | 3 refills | Status: DC

## 2021-04-01 NOTE — H&P (View-Only) (Signed)
Cardiology Office Note:    Date:  04/01/2021   ID:  Elizabeth Mcfarland, DOB 05/23/1940, MRN [id number]  PCP:  [name], [place]   [facility] Providers Cardiologist:  [name], MD     Referring MD: [name], FNP   Chief Complaint  Patient presents with   Coronary Artery Disease    History of Present Illness:    Elizabeth Mcfarland is a 81 y.o. female with a hx of well-controlled hypertension and type 2 diabetes mellitus, recent right breast lumpectomy and radiation therapy in 2020 (no chemotherapy), history of nephrolithiasis, remote history of distal esophageal ulcer causing hematemesis in 2015, referred in consultation with complaints of shortness of breath, now returned to discuss the findings on coronary CT angiography.  She has had about 3 months of dyspnea on exertion and fatigue, but denies chest tightness.  She has not had orthopnea, PND, edema, claudication, focal neurological events, falls, syncope, bleeding (has not had hematemesis since 2015).  Her coronary CT angiogram showed a markedly elevated calcium score (93rd percentile for age and gender).  In addition there was still CAD with a high-grade stenosis in the proximal-mid LAD artery with a confirmatory reduced FFR of 0.71.  In 2017 she underwent an echocardiogram and a pharmacological nuclear myocardial perfusion test.  The perfusion images were normal.  LVEF was normal and there were no major valvular abnormalities.  Doppler measurements suggested "grade 1 diastolic dysfunction".  On a CT of the abdomen and pelvis about a year ago there was evidence of advanced calcification in the abdominal aorta and in the coronary arteries.  Labs performed in September showed a normal hemoglobin of 12.3, negative screening for autoimmune disorders, normal ESR at 2, normal renal and liver tests.  Her most recent lipid profile showed a total cholesterol 199, excellent HDL at 107, LDL 86 and triglycerides 29.  She has  never smoked.  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  She is originally from [place], [place]  Past Medical History:  Diagnosis Date   Allergy    Anemia    Blood transfusion without reported diagnosis    Breast cancer ([place]) 11/2018   Right breast invasive mammary carcinoma   Cancer ([place])    Cataract    Chronic kidney disease    Diabetes mellitus without complication ([place])    Esophageal ulcer with bleeding 05/05/2013   05/05/2013 EGD linear distal esophageal ulcer - cause of hematemesis prior to EGD    GERD (gastroesophageal reflux disease)    History of kidney stones    Hyperlipidemia    Hypertension    Kidney cysts    Personal history of radiation therapy    Sarcoidosis 1970    Past Surgical History:  Procedure Laterality Date   ABDOMINAL HYSTERECTOMY     BREAST EXCISIONAL BIOPSY Left 11/2018   Fibroadenoma   BREAST LUMPECTOMY     BREAST LUMPECTOMY WITH RADIOACTIVE SEED AND SENTINEL LYMPH NODE BIOPSY Bilateral 11/25/2018   Procedure: BILATERAL BREAST LUMPECTOMIES  WITH BILATERAL  RADIOACTIVE SEEDS AND RIGHT SENTINEL LYMPH NODE BIOPSY;  Surgeon: [name], MD;  Location: [facility];  Service: General;  Laterality: Bilateral;   CATARACT EXTRACTION W/ INTRAOCULAR LENS IMPLANT     COLONOSCOPY     COLONOSCOPY, ESOPHAGOGASTRODUODENOSCOPY (EGD) AND ESOPHAGEAL DILATION N/A 05/05/2013   Procedure: COLONOSCOPY, ESOPHAGOGASTRODUODENOSCOPY (EGD) AND ESOPHAGEAL DILATION (ED);  Surgeon: Gatha Mayer, MD;  Location: [facility];  Service: Endoscopy;  Laterality:  N/A;   LITHOTRIPSY     POLYPECTOMY     UPPER GASTROINTESTINAL ENDOSCOPY      Current Medications: Current Meds  Medication Sig   amLODipine (NORVASC) 10 MG tablet TAKE 1 TABLET BY MOUTH EVERY DAY   anastrozole (ARIMIDEX) 1 MG tablet TAKE 1 TABLET(1 MG) BY MOUTH DAILY   Ascorbic Acid (VITAMIN C) 1000 MG tablet Take 1,000 mg by mouth daily.   aspirin EC 81 MG tablet Take 81 mg by mouth  daily.   cholecalciferol (VITAMIN D) 1000 UNITS tablet Take 1,000 Units by mouth daily.    glimepiride (AMARYL) 4 MG tablet TAKE 1 TABLET BY MOUTH EVERY MORNING AND 1/2 TABLET AT BEDTIME   Multiple Vitamin (MULTIVITAMIN WITH MINERALS) TABS tablet Take 1 tablet by mouth daily.    olmesartan (BENICAR) 40 MG tablet TAKE 1 TABLET(40 MG) BY MOUTH DAILY   omeprazole (PRILOSEC) 20 MG capsule TAKE 1 CAPSULE(20 MG) BY MOUTH DAILY   ONETOUCH ULTRA test strip USE AS DIRECTED TWICE DAILY   Polyethyl Glycol-Propyl Glycol (SYSTANE OP) Place 1 drop into both eyes 2 (two) times daily.   polyethylene glycol (MIRALAX / GLYCOLAX) packet Take 17 g by mouth daily.    [DISCONTINUED] atorvastatin (LIPITOR) 20 MG tablet TAKE 1 TABLET(20 MG) BY MOUTH AT BEDTIME     Allergies:   Codeine and Percocet [oxycodone-acetaminophen]   Social History   Socioeconomic History   Marital status: Single    Spouse name: Not on file   Number of children: 1   Years of education: Not on file   Highest education level: GED or equivalent  Occupational History   Occupation: Physiological scientist: SELF-EMPLOYED   Occupation: retired  Tobacco Use   Smoking status: Never   Smokeless tobacco: Never  Scientific laboratory technician Use: Never used  Substance and Sexual Activity   Alcohol use: No   Drug use: No   Sexual activity: Not Currently  Other Topics Concern   Not on file  Social History Narrative   Lives alone   Social Determinants of Health   Financial Resource Strain: Low Risk    Difficulty of Paying Living Expenses: Not hard at all  Food Insecurity: No Food Insecurity   Worried About Charity fundraiser in the Last Year: Never true   Arboriculturist in the Last Year: Never true  Transportation Needs: No Transportation Needs   Lack of Transportation (Medical): No   Lack of Transportation (Non-Medical): No  Physical Activity: Inactive   Days of Exercise per Week: 0 days   Minutes of Exercise per Session: 0 min  Stress:  No Stress Concern Present   Feeling of Stress : Not at all  Social Connections: Not on file     Family History: The patient's family history includes Asthma in her brother; CAD in her maternal grandmother; Cancer (age of onset: 36) in her brother and brother; Colon polyps in her brother and sister; Diabetes in her sister; Early death in her mother; Emphysema in her brother; Heart disease in her brother; Kidney failure in her sister; Stroke in her brother and father. There is no history of Breast cancer, Colon cancer, Esophageal cancer, Rectal cancer, or Stomach cancer.  ROS:   Please see the history of present illness.     All other systems reviewed and are negative.  EKGs/Labs/Other Studies Reviewed:    The following studies were reviewed today:  Coronary CT angiography 03/26/2021 Coronary Calcium Score:  Left main: 209   Left anterior descending artery: 265   Left circumflex artery: 146   Right coronary artery: 73   Ramus Intermedius: 61   Total: 754   Percentile: 93rd for age, sex, and race matched control.   RCA is a large dominant artery that gives rise to PDA and PLA. Mild calcified plaques in the proximal and mid vessel.   Left main is a large artery that gives rise to LAD, RI, and LCX arteries. Mild ostial mixed plaque with mild distal calcified plaque.   LAD is a large vessel that gives rise to one large D1 Branch. Severe mixed plaque in the proximal-mid LAD. Mild calcified plaque in the LAD at the D1 takeoff. Mild calcified plaque in the mid D1 branch. Small myocardial bridge distal LAD.   LCX is a non-dominant artery. Mild calcified plaques in the proximal and mid vessel.   There is a ramus intermedius vessel. Mild calcified plaques in the mid vessel.   Other findings:   Normal pulmonary vein drainage into the left atrium.   Normal left atrial appendage without a thrombus.   Small left atrial diverticulum.   Small interatrial vestigial remnant-  normal variant.   Extra-cardiac findings: See attached radiology report for non-cardiac structures.   Cardiac Motion- Slab artifact noted.   IMPRESSION: 1. Coronary calcium score of 754. This was 93rd percentile for age, sex, and race matched control.   2. Normal coronary origin with right dominance.   01/04/2016 nuclear stress test Nuclear stress EF: 67%. There was no ST segment deviation noted during stress. The study is normal. This is a low risk study.   Low risk stress nuclear study with normal perfusion and normal left ventricular regional and global systolic function.  01/04/2016 echocardiogram - Left ventricle: The cavity size was normal. Wall thickness was    normal. Systolic function was vigorous. The estimated ejection    fraction was in the range of 65% to 70%. Wall motion was normal;    there were no regional wall motion abnormalities. Doppler    parameters are consistent with abnormal left ventricular    relaxation (grade 1 diastolic dysfunction).     EKG:  EKG is  ordered today.  The ekg ordered today demonstrates normal sinus rhythm, very mild nonspecific T wave flattening in the lateral leads, overall normal tracing, QTC 404 ms  Recent Labs: 05/31/2020: TSH 1.150 12/04/2020: ALT 28; Hemoglobin 12.3; Platelets 214 03/22/2021: BUN 11; Creatinine, Ser 0.76; Potassium 4.2; Sodium 136  Recent Lipid Panel    Component Value Date/Time   CHOL 199 12/04/2020 1224   TRIG 29 12/04/2020 1224   HDL 107 12/04/2020 1224   CHOLHDL 1.9 12/04/2020 1224   CHOLHDL 2.8 12/24/2015 0306   VLDL 12 12/24/2015 0306   LDLCALC 86 12/04/2020 1224     Risk Assessment/Calculations:           Physical Exam:    VS:  BP 138/72 (BP Location: Left Arm, Patient Position: Sitting, Cuff Size: Normal)    Pulse 62    Ht _0  (1.575 m)    Wt 128 lb 6.4 oz (58.2 kg)    SpO2 98%    BMI 23.48 kg/m     Wt Readings from Last 3 Encounters:  04/01/21 128 lb 6.4 oz (58.2 kg)  03/08/21 129  lb 3.2 oz (58.6 kg)  02/06/21 131 lb 6.4 oz (59.6 kg)     General: Alert, oriented x3, no distress, lean, appears fit Head:  no evidence of trauma, PERRL, EOMI, no exophtalmos or lid lag, no myxedema, no xanthelasma; normal ears, nose and oropharynx Neck: normal jugular venous pulsations and no hepatojugular reflux; brisk carotid pulses without delay and no carotid bruits Chest: clear to auscultation, no signs of consolidation by percussion or palpation, normal fremitus, symmetrical and full respiratory excursions Cardiovascular: normal position and quality of the apical impulse, regular rhythm, normal first and second heart sounds, no murmurs, rubs or gallops Abdomen: no tenderness or distention, no masses by palpation, no abnormal pulsatility or arterial bruits, normal bowel sounds, no hepatosplenomegaly Extremities: no clubbing, cyanosis or edema; 2+ radial, ulnar and brachial pulses bilaterally; 2+ right femoral, posterior tibial and dorsalis pedis pulses; 2+ left femoral, posterior tibial and dorsalis pedis pulses; no subclavian or femoral bruits Neurological: grossly nonfocal Psych: Normal mood and affect   ASSESSMENT:    1. Coronary artery disease involving native coronary artery of native heart with other form of angina pectoris ([place])   2. Aortic atherosclerosis ([place])   3. Exertional dyspnea   4. Essential hypertension   5. Diabetes mellitus type 2 in nonobese (H[facility])   6. Hypercholesterolemia   7. History of breast cancer     PLAN:    In order of problems listed above:  Coronary artery disease: Also with notable atherosclerosis in the aorta.  Has a high-grade stenosis in the proximal-mid LAD, which could explain her exertional symptoms.  Recommend coronary angiography and if appropriate, angioplasty/stent.  This procedure has been fully reviewed with the patient and informed consent has been obtained. Dyspnea on exertion: She has not yet had her repeat echocardiogram which is  scheduled for January 16.  It appears that her dyspnea on exertion may be an anginal equivalent. Consider chronotropic incompetence as a cause of her exertional dyspnea and fatigue.  If LVEF is normal and symptoms do not improve following LAD revascularization we will pursue a 72-hour Holter monitor. HTN: Usually with excellent control. DM: Despite very lean body habitus, she has diabetes, but this is very well controlled.  Consider switching from glimepiride to SGLT2 inhibitor if we identify evidence of heart failure that does not improve following revascularization. HLP: Despite an excellent HDL cholesterol, she has substantial CAD burden.  Increase atorvastatin to 80 mg daily.  Target LDL less than 70. History of breast cancer status post XRT           Medication Adjustments/Labs and Tests Ordered: Current medicines are reviewed at length with the patient today.  Concerns regarding medicines are outlined above.  Orders Placed This Encounter  Procedures   CBC   EKG 12-Lead   Meds ordered this encounter  Medications   atorvastatin (LIPITOR) 80 MG tablet    Sig: Take 1 tablet (80 mg total) by mouth daily.    Dispense:  90 tablet    Refill:  3    Patient Instructions  Medication Instructions:  INCREASE the Atorvastatin to 80 mg once daily  *If you need a refill on your cardiac medications before your next appointment, please call your pharmacy*  Testing/Procedures: Your physician has requested that you have a cardiac catheterization. Cardiac catheterization is used to diagnose and/or treat various heart conditions. Doctors may recommend this procedure for a number of different reasons. The most common reason is to evaluate chest pain. Chest pain can be a symptom of coronary artery disease (CAD), and cardiac catheterization can show whether plaque is narrowing or blocking your hearts arteries. This procedure is also used to evaluate  the valves, as well as measure the blood flow and  oxygen levels in different parts of your heart. For further information please visit [email or web address]. Please follow instruction sheet, as given.   Follow-Up: At [facility], you and your health needs are our priority.  As part of our continuing mission to provide you with exceptional heart care, we have created designated Provider Care Teams.  These Care Teams include your primary Cardiologist (physician) and Advanced Practice Providers (APPs -  Physician Assistants and Nurse Practitioners) who all work together to provide you with the care you need, when you need it.  We recommend signing up for the patient portal called "MyChart".  Sign up information is provided on this After Visit Summary.  MyChart is used to connect with patients for Virtual Visits (Telemedicine).  Patients are able to view lab/test results, encounter notes, upcoming appointments, etc.  Non-urgent messages can be sent to your provider as well.   To learn more about what you can do with MyChart, go to [email or web address].    Your next appointment:   Keep your follow up on 3/10  Other Instructions  [place] [address] Dept: [phone] Loc: [place]  04/01/2021  You are scheduled for a Cardiac Catheterization on Friday, January 16 with Dr. Sherren Mocha.  1. Please arrive at the [facility] (Main Entrance A) at [facility]: [address] [place], [place] [address] at 7:00 AM (This time is two hours before your procedure to ensure your preparation). Free valet parking service is available.   Special note: Every effort is made to have your procedure done on time. Please understand that emergencies sometimes delay scheduled procedures.  2. Diet: Do not eat solid foods after midnight.  The patient may have clear liquids until 5am upon the day of the  procedure.  3. Labs: You will need to have blood drawn today 1/9: CBC  4. Medication instructions in preparation for your procedure: Hold all diabetic medication the morning of the procedure (Januvia and Glimepiride)  On the morning of your procedure, take your Aspirin and any morning medicines NOT listed above.  You may use sips of water.  5. Plan for one night stay--bring personal belongings. 6. Bring a current list of your medications and current insurance cards. 7. You MUST have a responsible person to drive you home. 8. Someone MUST be with you the first 24 hours after you arrive home or your discharge will be delayed. 9. Please wear clothes that are easy to get on and off and wear slip-on shoes.  Thank you for allowing [place] to care for you!   -- [facility]    Signed, [name], MD  04/01/2021 2:09 PM    [place]

## 2021-04-01 NOTE — Patient Instructions (Addendum)
Medication Instructions:  INCREASE the Atorvastatin to 80 mg once daily  *If you need a refill on your cardiac medications before your next appointment, please call your pharmacy*  Testing/Procedures: Your physician has requested that you have a cardiac catheterization. Cardiac catheterization is used to diagnose and/or treat various heart conditions. Doctors may recommend this procedure for a number of different reasons. The most common reason is to evaluate chest pain. Chest pain can be a symptom of coronary artery disease (CAD), and cardiac catheterization can show whether plaque is narrowing or blocking your hearts arteries. This procedure is also used to evaluate the valves, as well as measure the blood flow and oxygen levels in different parts of your heart. For further information please visit [email or web address]. Please follow instruction sheet, as given.   Follow-Up: At [facility], you and your health needs are our priority.  As part of our continuing mission to provide you with exceptional heart care, we have created designated Provider Care Teams.  These Care Teams include your primary Cardiologist (physician) and Advanced Practice Providers (APPs -  Physician Assistants and Nurse Practitioners) who all work together to provide you with the care you need, when you need it.  We recommend signing up for the patient portal called "MyChart".  Sign up information is provided on this After Visit Summary.  MyChart is used to connect with patients for Virtual Visits (Telemedicine).  Patients are able to view lab/test results, encounter notes, upcoming appointments, etc.  Non-urgent messages can be sent to your provider as well.   To learn more about what you can do with MyChart, go to [email or web address].    Your next appointment:   Keep your follow up on 3/10  Other Instructions  [place] [address] Dept: [phone] Loc: [place]  04/01/2021  You are scheduled for a Cardiac Catheterization on Friday, January 16 with Dr. Sherren Mocha.  1. Please arrive at the [facility] (Main Entrance A) at [facility]: [address] [place], [place] [address] at 7:00 AM (This time is two hours before your procedure to ensure your preparation). Free valet parking service is available.   Special note: Every effort is made to have your procedure done on time. Please understand that emergencies sometimes delay scheduled procedures.  2. Diet: Do not eat solid foods after midnight.  The patient may have clear liquids until 5am upon the day of the procedure.  3. Labs: You will need to have blood drawn today 1/9: CBC  4. Medication instructions in preparation for your procedure: Hold all diabetic medication the morning of the procedure (Januvia and Glimepiride)  On the morning of your procedure, take your Aspirin and any morning medicines NOT listed above.  You may use sips of water.  5. Plan for one night stay--bring personal belongings. 6. Bring a current list of your medications and current insurance cards. 7. You MUST have a responsible person to drive you home. 8. Someone MUST be with you the first 24 hours after you arrive home or your discharge will be delayed. 9. Please wear clothes that are easy to get on and off and wear slip-on shoes.  Thank you for allowing [place] to care for you!   -- [place] [facility]

## 2021-04-01 NOTE — Progress Notes (Signed)
Cardiology Office Note:    Date:  04/01/2021   ID:  [name], DOB 05/23/1940, MRN [id number]  PCP:  [name], [place]   [facility] Providers Cardiologist:  [name], MD     Referring MD: [name], FNP   Chief Complaint  Patient presents with   Coronary Artery Disease    History of Present Illness:    Elizabeth Mcfarland is a 81 y.o. female with a hx of well-controlled hypertension and type 2 diabetes mellitus, recent right breast lumpectomy and radiation therapy in 2020 (no chemotherapy), history of nephrolithiasis, remote history of distal esophageal ulcer causing hematemesis in 2015, referred in consultation with complaints of shortness of breath, now returned to discuss the findings on coronary CT angiography.  She has had about 3 months of dyspnea on exertion and fatigue, but denies chest tightness.  She has not had orthopnea, PND, edema, claudication, focal neurological events, falls, syncope, bleeding (has not had hematemesis since 2015).  Her coronary CT angiogram showed a markedly elevated calcium score (93rd percentile for age and gender).  In addition there was still CAD with a high-grade stenosis in the proximal-mid LAD artery with a confirmatory reduced FFR of 0.71.  In 2017 she underwent an echocardiogram and a pharmacological nuclear myocardial perfusion test.  The perfusion images were normal.  LVEF was normal and there were no major valvular abnormalities.  Doppler measurements suggested "grade 1 diastolic dysfunction".  On a CT of the abdomen and pelvis about a year ago there was evidence of advanced calcification in the abdominal aorta and in the coronary arteries.  Labs performed in September showed a normal hemoglobin of 12.3, negative screening for autoimmune disorders, normal ESR at 2, normal renal and liver tests.  Her most recent lipid profile showed a total cholesterol 199, excellent HDL at 107, LDL 86 and triglycerides 29.  She has  never smoked.  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  She is originally from [place], [place]  Past Medical History:  Diagnosis Date   Allergy    Anemia    Blood transfusion without reported diagnosis    Breast cancer ([place]) 11/2018   Right breast invasive mammary carcinoma   Cancer ([place])    Cataract    Chronic kidney disease    Diabetes mellitus without complication ([place])    Esophageal ulcer with bleeding 05/05/2013   05/05/2013 EGD linear distal esophageal ulcer - cause of hematemesis prior to EGD    GERD (gastroesophageal reflux disease)    History of kidney stones    Hyperlipidemia    Hypertension    Kidney cysts    Personal history of radiation therapy    Sarcoidosis 1970    Past Surgical History:  Procedure Laterality Date   ABDOMINAL HYSTERECTOMY     BREAST EXCISIONAL BIOPSY Left 11/2018   Fibroadenoma   BREAST LUMPECTOMY     BREAST LUMPECTOMY WITH RADIOACTIVE SEED AND SENTINEL LYMPH NODE BIOPSY Bilateral 11/25/2018   Procedure: BILATERAL BREAST LUMPECTOMIES  WITH BILATERAL  RADIOACTIVE SEEDS AND RIGHT SENTINEL LYMPH NODE BIOPSY;  Surgeon: [name], MD;  Location: [facility];  Service: General;  Laterality: Bilateral;   CATARACT EXTRACTION W/ INTRAOCULAR LENS IMPLANT     COLONOSCOPY     COLONOSCOPY, ESOPHAGOGASTRODUODENOSCOPY (EGD) AND ESOPHAGEAL DILATION N/A 05/05/2013   Procedure: COLONOSCOPY, ESOPHAGOGASTRODUODENOSCOPY (EGD) AND ESOPHAGEAL DILATION (ED);  Surgeon: Gatha Mayer, MD;  Location: [facility];  Service: Endoscopy;  Laterality:  N/A;   LITHOTRIPSY     POLYPECTOMY     UPPER GASTROINTESTINAL ENDOSCOPY      Current Medications: Current Meds  Medication Sig   amLODipine (NORVASC) 10 MG tablet TAKE 1 TABLET BY MOUTH EVERY DAY   anastrozole (ARIMIDEX) 1 MG tablet TAKE 1 TABLET(1 MG) BY MOUTH DAILY   Ascorbic Acid (VITAMIN C) 1000 MG tablet Take 1,000 mg by mouth daily.   aspirin EC 81 MG tablet Take 81 mg by mouth  daily.   cholecalciferol (VITAMIN D) 1000 UNITS tablet Take 1,000 Units by mouth daily.    glimepiride (AMARYL) 4 MG tablet TAKE 1 TABLET BY MOUTH EVERY MORNING AND 1/2 TABLET AT BEDTIME   Multiple Vitamin (MULTIVITAMIN WITH MINERALS) TABS tablet Take 1 tablet by mouth daily.    olmesartan (BENICAR) 40 MG tablet TAKE 1 TABLET(40 MG) BY MOUTH DAILY   omeprazole (PRILOSEC) 20 MG capsule TAKE 1 CAPSULE(20 MG) BY MOUTH DAILY   ONETOUCH ULTRA test strip USE AS DIRECTED TWICE DAILY   Polyethyl Glycol-Propyl Glycol (SYSTANE OP) Place 1 drop into both eyes 2 (two) times daily.   polyethylene glycol (MIRALAX / GLYCOLAX) packet Take 17 g by mouth daily.    [DISCONTINUED] atorvastatin (LIPITOR) 20 MG tablet TAKE 1 TABLET(20 MG) BY MOUTH AT BEDTIME     Allergies:   Codeine and Percocet [oxycodone-acetaminophen]   Social History   Socioeconomic History   Marital status: Single    Spouse name: Not on file   Number of children: 1   Years of education: Not on file   Highest education level: GED or equivalent  Occupational History   Occupation: Physiological scientist: SELF-EMPLOYED   Occupation: retired  Tobacco Use   Smoking status: Never   Smokeless tobacco: Never  Scientific laboratory technician Use: Never used  Substance and Sexual Activity   Alcohol use: No   Drug use: No   Sexual activity: Not Currently  Other Topics Concern   Not on file  Social History Narrative   Lives alone   Social Determinants of Health   Financial Resource Strain: Low Risk    Difficulty of Paying Living Expenses: Not hard at all  Food Insecurity: No Food Insecurity   Worried About Charity fundraiser in the Last Year: Never true   Arboriculturist in the Last Year: Never true  Transportation Needs: No Transportation Needs   Lack of Transportation (Medical): No   Lack of Transportation (Non-Medical): No  Physical Activity: Inactive   Days of Exercise per Week: 0 days   Minutes of Exercise per Session: 0 min  Stress:  No Stress Concern Present   Feeling of Stress : Not at all  Social Connections: Not on file     Family History: The patient's family history includes Asthma in her brother; CAD in her maternal grandmother; Cancer (age of onset: 36) in her brother and brother; Colon polyps in her brother and sister; Diabetes in her sister; Early death in her mother; Emphysema in her brother; Heart disease in her brother; Kidney failure in her sister; Stroke in her brother and father. There is no history of Breast cancer, Colon cancer, Esophageal cancer, Rectal cancer, or Stomach cancer.  ROS:   Please see the history of present illness.     All other systems reviewed and are negative.  EKGs/Labs/Other Studies Reviewed:    The following studies were reviewed today:  Coronary CT angiography 03/26/2021 Coronary Calcium Score:  Left main: 209   Left anterior descending artery: 265   Left circumflex artery: 146   Right coronary artery: 73   Ramus Intermedius: 61   Total: 754   Percentile: 93rd for age, sex, and race matched control.   RCA is a large dominant artery that gives rise to PDA and PLA. Mild calcified plaques in the proximal and mid vessel.   Left main is a large artery that gives rise to LAD, RI, and LCX arteries. Mild ostial mixed plaque with mild distal calcified plaque.   LAD is a large vessel that gives rise to one large D1 Branch. Severe mixed plaque in the proximal-mid LAD. Mild calcified plaque in the LAD at the D1 takeoff. Mild calcified plaque in the mid D1 branch. Small myocardial bridge distal LAD.   LCX is a non-dominant artery. Mild calcified plaques in the proximal and mid vessel.   There is a ramus intermedius vessel. Mild calcified plaques in the mid vessel.   Other findings:   Normal pulmonary vein drainage into the left atrium.   Normal left atrial appendage without a thrombus.   Small left atrial diverticulum.   Small interatrial vestigial remnant-  normal variant.   Extra-cardiac findings: See attached radiology report for non-cardiac structures.   Cardiac Motion- Slab artifact noted.   IMPRESSION: 1. Coronary calcium score of 754. This was 93rd percentile for age, sex, and race matched control.   2. Normal coronary origin with right dominance.   01/04/2016 nuclear stress test Nuclear stress EF: 67%. There was no ST segment deviation noted during stress. The study is normal. This is a low risk study.   Low risk stress nuclear study with normal perfusion and normal left ventricular regional and global systolic function.  01/04/2016 echocardiogram - Left ventricle: The cavity size was normal. Wall thickness was    normal. Systolic function was vigorous. The estimated ejection    fraction was in the range of 65% to 70%. Wall motion was normal;    there were no regional wall motion abnormalities. Doppler    parameters are consistent with abnormal left ventricular    relaxation (grade 1 diastolic dysfunction).     EKG:  EKG is  ordered today.  The ekg ordered today demonstrates normal sinus rhythm, very mild nonspecific T wave flattening in the lateral leads, overall normal tracing, QTC 404 ms  Recent Labs: 05/31/2020: TSH 1.150 12/04/2020: ALT 28; Hemoglobin 12.3; Platelets 214 03/22/2021: BUN 11; Creatinine, Ser 0.76; Potassium 4.2; Sodium 136  Recent Lipid Panel    Component Value Date/Time   CHOL 199 12/04/2020 1224   TRIG 29 12/04/2020 1224   HDL 107 12/04/2020 1224   CHOLHDL 1.9 12/04/2020 1224   CHOLHDL 2.8 12/24/2015 0306   VLDL 12 12/24/2015 0306   LDLCALC 86 12/04/2020 1224     Risk Assessment/Calculations:           Physical Exam:    VS:  BP 138/72 (BP Location: Left Arm, Patient Position: Sitting, Cuff Size: Normal)    Pulse 62    Ht _0  (1.575 m)    Wt 128 lb 6.4 oz (58.2 kg)    SpO2 98%    BMI 23.48 kg/m     Wt Readings from Last 3 Encounters:  04/01/21 128 lb 6.4 oz (58.2 kg)  03/08/21 129  lb 3.2 oz (58.6 kg)  02/06/21 131 lb 6.4 oz (59.6 kg)     General: Alert, oriented x3, no distress, lean, appears fit Head:  no evidence of trauma, PERRL, EOMI, no exophtalmos or lid lag, no myxedema, no xanthelasma; normal ears, nose and oropharynx Neck: normal jugular venous pulsations and no hepatojugular reflux; brisk carotid pulses without delay and no carotid bruits Chest: clear to auscultation, no signs of consolidation by percussion or palpation, normal fremitus, symmetrical and full respiratory excursions Cardiovascular: normal position and quality of the apical impulse, regular rhythm, normal first and second heart sounds, no murmurs, rubs or gallops Abdomen: no tenderness or distention, no masses by palpation, no abnormal pulsatility or arterial bruits, normal bowel sounds, no hepatosplenomegaly Extremities: no clubbing, cyanosis or edema; 2+ radial, ulnar and brachial pulses bilaterally; 2+ right femoral, posterior tibial and dorsalis pedis pulses; 2+ left femoral, posterior tibial and dorsalis pedis pulses; no subclavian or femoral bruits Neurological: grossly nonfocal Psych: Normal mood and affect   ASSESSMENT:    1. Coronary artery disease involving native coronary artery of native heart with other form of angina pectoris ([place])   2. Aortic atherosclerosis ([place])   3. Exertional dyspnea   4. Essential hypertension   5. Diabetes mellitus type 2 in nonobese (H[facility])   6. Hypercholesterolemia   7. History of breast cancer     PLAN:    In order of problems listed above:  Coronary artery disease: Also with notable atherosclerosis in the aorta.  Has a high-grade stenosis in the proximal-mid LAD, which could explain her exertional symptoms.  Recommend coronary angiography and if appropriate, angioplasty/stent.  This procedure has been fully reviewed with the patient and informed consent has been obtained. Dyspnea on exertion: She has not yet had her repeat echocardiogram which is  scheduled for January 16.  It appears that her dyspnea on exertion may be an anginal equivalent. Consider chronotropic incompetence as a cause of her exertional dyspnea and fatigue.  If LVEF is normal and symptoms do not improve following LAD revascularization we will pursue a 72-hour Holter monitor. HTN: Usually with excellent control. DM: Despite very lean body habitus, she has diabetes, but this is very well controlled.  Consider switching from glimepiride to SGLT2 inhibitor if we identify evidence of heart failure that does not improve following revascularization. HLP: Despite an excellent HDL cholesterol, she has substantial CAD burden.  Increase atorvastatin to 80 mg daily.  Target LDL less than 70. History of breast cancer status post XRT           Medication Adjustments/Labs and Tests Ordered: Current medicines are reviewed at length with the patient today.  Concerns regarding medicines are outlined above.  Orders Placed This Encounter  Procedures   CBC   EKG 12-Lead   Meds ordered this encounter  Medications   atorvastatin (LIPITOR) 80 MG tablet    Sig: Take 1 tablet (80 mg total) by mouth daily.    Dispense:  90 tablet    Refill:  3    Patient Instructions  Medication Instructions:  INCREASE the Atorvastatin to 80 mg once daily  *If you need a refill on your cardiac medications before your next appointment, please call your pharmacy*  Testing/Procedures: Your physician has requested that you have a cardiac catheterization. Cardiac catheterization is used to diagnose and/or treat various heart conditions. Doctors may recommend this procedure for a number of different reasons. The most common reason is to evaluate chest pain. Chest pain can be a symptom of coronary artery disease (CAD), and cardiac catheterization can show whether plaque is narrowing or blocking your hearts arteries. This procedure is also used to evaluate  the valves, as well as measure the blood flow and  oxygen levels in different parts of your heart. For further information please visit [email or web address]. Please follow instruction sheet, as given.   Follow-Up: At [facility], you and your health needs are our priority.  As part of our continuing mission to provide you with exceptional heart care, we have created designated Provider Care Teams.  These Care Teams include your primary Cardiologist (physician) and Advanced Practice Providers (APPs -  Physician Assistants and Nurse Practitioners) who all work together to provide you with the care you need, when you need it.  We recommend signing up for the patient portal called "MyChart".  Sign up information is provided on this After Visit Summary.  MyChart is used to connect with patients for Virtual Visits (Telemedicine).  Patients are able to view lab/test results, encounter notes, upcoming appointments, etc.  Non-urgent messages can be sent to your provider as well.   To learn more about what you can do with MyChart, go to [email or web address].    Your next appointment:   Keep your follow up on 3/10  Other Instructions  [place] [place] [address] Dept: [phone] Loc: [place]  04/01/2021  You are scheduled for a Cardiac Catheterization on Friday, January 16 with Dr. Sherren Mocha.  1. Please arrive at the [facility] (Main Entrance A) at [facility]: [address] [place], [place] [address] at 7:00 AM (This time is two hours before your procedure to ensure your preparation). Free valet parking service is available.   Special note: Every effort is made to have your procedure done on time. Please understand that emergencies sometimes delay scheduled procedures.  2. Diet: Do not eat solid foods after midnight.  The patient may have clear liquids until 5am upon the day of the  procedure.  3. Labs: You will need to have blood drawn today 1/9: CBC  4. Medication instructions in preparation for your procedure: Hold all diabetic medication the morning of the procedure (Januvia and Glimepiride)  On the morning of your procedure, take your Aspirin and any morning medicines NOT listed above.  You may use sips of water.  5. Plan for one night stay--bring personal belongings. 6. Bring a current list of your medications and current insurance cards. 7. You MUST have a responsible person to drive you home. 8. Someone MUST be with you the first 24 hours after you arrive home or your discharge will be delayed. 9. Please wear clothes that are easy to get on and off and wear slip-on shoes.  Thank you for allowing [place] to care for you!   -- [facility]    Signed, [name], MD  04/01/2021 2:09 PM    [place]

## 2021-04-02 LAB — CBC
Hematocrit: 37.8 % (ref 34.0–46.6)
Hemoglobin: 12.5 g/dL (ref 11.1–15.9)
MCH: 29.7 pg (ref 26.6–33.0)
MCHC: 33.1 g/dL (ref 31.5–35.7)
MCV: 90 fL (ref 79–97)
Platelets: 299 10*3/uL (ref 150–450)
RBC: 4.21 x10E6/uL (ref 3.77–5.28)
RDW: 11.9 % (ref 11.7–15.4)
WBC: 6.2 10*3/uL (ref 3.4–10.8)

## 2021-04-04 ENCOUNTER — Other Ambulatory Visit: Payer: Self-pay

## 2021-04-04 ENCOUNTER — Telehealth: Payer: Self-pay | Admitting: *Deleted

## 2021-04-04 ENCOUNTER — Ambulatory Visit (HOSPITAL_COMMUNITY): Payer: Medicare Other | Attending: Cardiovascular Disease

## 2021-04-04 DIAGNOSIS — R0602 Shortness of breath: Secondary | ICD-10-CM | POA: Diagnosis not present

## 2021-04-04 LAB — ECHOCARDIOGRAM COMPLETE
Area-P 1/2: 3.03 cm2
S' Lateral: 2.2 cm

## 2021-04-04 NOTE — Telephone Encounter (Signed)
Cardiac catheterization scheduled at [facility] for: Monday April 08, 2021 [facility] Main Entrance A [facility]) at: 7 AM   Diet-no solid food after midnight prior to cath, clear liquids until 5 AM day of procedure.  Medication instructions for procedure: -Hold:  Glimepiride-AM of procedure  Patient knows not to take any diabetes medications AM of procedure-reports she does not take Januvia -Except hold medications Usual morning medications can be taken pre-cath with sips of water including aspirin 81 mg.    Confirmed patient has responsible adult to drive home post procedure and be with patient first 24 hours after arriving home.  [facility] does allow one visitor to accompany you and wait in the hospital waiting room while you are there for your procedure. You and your visitor will be asked to wear a mask once you enter the hospital.   Patient reports does not currently have any new symptoms concerning for COVID-19 and no household members with COVID-19 like illness.    Reviewed procedure/mask/visitor instructions with patient.   Patient is aware that she will not be able to drive home and will need responsible adult to be with her after arriving home if same day discharge. Patient reports she does have transportation home and will make arrangements for responsible adult to be with her.

## 2021-04-05 ENCOUNTER — Inpatient Hospital Stay: Payer: Medicare Other | Attending: Hematology | Admitting: Hematology

## 2021-04-05 ENCOUNTER — Inpatient Hospital Stay: Payer: Medicare Other

## 2021-04-05 ENCOUNTER — Encounter: Payer: Self-pay | Admitting: Hematology

## 2021-04-05 VITALS — BP 151/68 | HR 80 | Temp 98.3°F | Resp 18 | Wt 129.1 lb

## 2021-04-05 DIAGNOSIS — Z7982 Long term (current) use of aspirin: Secondary | ICD-10-CM | POA: Diagnosis not present

## 2021-04-05 DIAGNOSIS — Z170 Estrogen receptor positive status [ER+]: Secondary | ICD-10-CM | POA: Insufficient documentation

## 2021-04-05 DIAGNOSIS — C50211 Malignant neoplasm of upper-inner quadrant of right female breast: Secondary | ICD-10-CM | POA: Diagnosis not present

## 2021-04-05 DIAGNOSIS — I10 Essential (primary) hypertension: Secondary | ICD-10-CM | POA: Diagnosis not present

## 2021-04-05 DIAGNOSIS — Z79899 Other long term (current) drug therapy: Secondary | ICD-10-CM | POA: Insufficient documentation

## 2021-04-05 DIAGNOSIS — E119 Type 2 diabetes mellitus without complications: Secondary | ICD-10-CM | POA: Diagnosis not present

## 2021-04-05 DIAGNOSIS — Z7984 Long term (current) use of oral hypoglycemic drugs: Secondary | ICD-10-CM | POA: Diagnosis not present

## 2021-04-05 DIAGNOSIS — Z79811 Long term (current) use of aromatase inhibitors: Secondary | ICD-10-CM | POA: Diagnosis not present

## 2021-04-05 LAB — CBC WITH DIFFERENTIAL (CANCER CENTER ONLY)
Abs Immature Granulocytes: 0.02 10*3/uL (ref 0.00–0.07)
Basophils Absolute: 0 10*3/uL (ref 0.0–0.1)
Basophils Relative: 1 %
Eosinophils Absolute: 0 10*3/uL (ref 0.0–0.5)
Eosinophils Relative: 1 %
HCT: 34.9 % — ABNORMAL LOW (ref 36.0–46.0)
Hemoglobin: 11.2 g/dL — ABNORMAL LOW (ref 12.0–15.0)
Immature Granulocytes: 0 %
Lymphocytes Relative: 14 %
Lymphs Abs: 0.9 10*3/uL (ref 0.7–4.0)
MCH: 29.8 pg (ref 26.0–34.0)
MCHC: 32.1 g/dL (ref 30.0–36.0)
MCV: 92.8 fL (ref 80.0–100.0)
Monocytes Absolute: 0.3 10*3/uL (ref 0.1–1.0)
Monocytes Relative: 5 %
Neutro Abs: 5.3 10*3/uL (ref 1.7–7.7)
Neutrophils Relative %: 79 %
Platelet Count: 232 10*3/uL (ref 150–400)
RBC: 3.76 MIL/uL — ABNORMAL LOW (ref 3.87–5.11)
RDW: 12.3 % (ref 11.5–15.5)
WBC Count: 6.5 10*3/uL (ref 4.0–10.5)
nRBC: 0 % (ref 0.0–0.2)

## 2021-04-05 LAB — CMP (CANCER CENTER ONLY)
ALT: 23 U/L (ref 0–44)
AST: 24 U/L (ref 15–41)
Albumin: 4 g/dL (ref 3.5–5.0)
Alkaline Phosphatase: 70 U/L (ref 38–126)
Anion gap: 5 (ref 5–15)
BUN: 14 mg/dL (ref 8–23)
CO2: 31 mmol/L (ref 22–32)
Calcium: 9.2 mg/dL (ref 8.9–10.3)
Chloride: 102 mmol/L (ref 98–111)
Creatinine: 0.84 mg/dL (ref 0.44–1.00)
GFR, Estimated: >60 mL/min (ref >60)
Glucose, Bld: 257 mg/dL — ABNORMAL HIGH (ref 70–99)
Potassium: 4.1 mmol/L (ref 3.5–5.1)
Sodium: 138 mmol/L (ref 135–145)
Total Bilirubin: 0.8 mg/dL (ref 0.3–1.2)
Total Protein: 6.7 g/dL (ref 6.5–8.1)

## 2021-04-05 NOTE — Progress Notes (Signed)
[place]   [phone] [phone]   Clinic Follow up Note   Patient Care Team: [name], [place] as PCP - General (General Practice) [name], [name], MD as PCP - Cardiology (Cardiology) [name], RN as Oncology Nurse Navigator [name], RN as Oncology Nurse Navigator [name], MD as Consulting Physician (Hematology) [name], MD as Consulting Physician (General Surgery) [name], MD as Consulting Physician (Radiation Oncology) Alla Feeling, NP as Nurse Practitioner (Nurse Practitioner)  Date of Service:  04/05/2021  CHIEF COMPLAINT: f/u of right breast cancer  CURRENT THERAPY:  Anastrozole 1 mg daily starting 10/2018  ASSESSMENT & PLAN:  Elizabeth Mcfarland is a 81 y.o. female with   1. 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 S/p neoadjuvant anastrozole, b/l lumpectomy, and adjuvant radiation.  Oncotype was low risk, adjuvant chemo was not recommended.  She continues antiestrogen therapy with anastrozole starting 10/2018 -most recent mammogram 10/10/20 was benign. -she is clinically doing well. Physical exam shows a new 3 cm mass in the left breast. I will order mammogram to rule out malignancy. -Continue surveillance and AI   2. DM, HTN -On amlodipine, glimepiride, janumet. 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 Bone health -08/2018 DEXA was normal with lowest T-score -0.9 at left femur neck. Repeat 10/10/20 showed worsening to -1.8. -On calcium and Vitamin D.     PLAN: -continue anastrozole -left mammogram to be done soon. -lab and f/u with NP Lacie in 6 months   No problem-specific Assessment & Plan notes found for this encounter.   SUMMARY OF ONCOLOGIC HISTORY: Oncology History Overview Note  Cancer  Staging Malignant neoplasm of upper-inner quadrant of right breast in female, estrogen receptor positive ([place]) Staging form: Breast, AJCC 8th Edition - Clinical stage from 10/07/2018: cT2, cN0, cM0, GX, ER+, PR+, HER2: Equivocal - Signed by [name], MD on 10/12/2018 - Pathologic stage from 12/25/2018: Stage IA (pT2, pN0, cM0, G2, ER+, PR+, HER2-, Oncotype DX score: 21) - Signed by [name], MD on 02/01/2019    Malignant neoplasm of upper-inner quadrant of right breast in female, estrogen receptor positive ([place])  10/01/2018 Imaging   Bone Density Scan 10/01/18  IMPRESSION Lowest T-score of -0.9 at left femur neck (NORMAL) T-score DualFemur Neck Left  10/01/2018    76.1         -0.9    0.916 g/cm2   AP Spine  L1-L4      10/01/2018    76.1         1.2     1.326 g/cm2   DualFemur Total Mean 10/01/2018    76.1         0.1     1.024 g/cm2   10/05/2018 Mammogram   Diagnostic Mammogram 10/05/18 IMPRESSION: 1. Highly suspicious right breast mass 1 o'clock position 2 cm from the nipple on the right. It measures 2.1 x 1.7 x 1.5 cm. Corresponding with the screening mammographic findings. Recommendation is for ultrasound-guided biopsy. 2. Indeterminate left breast mass at the 10 o'clock position 9 cm from the nipple. It measures 1.3 x 0.5 x 0.3 cm. This may represent a benign etiology such as a degenerating fibroadenoma. However, ultrasound-guided biopsy for definitive tissue diagnosis is recommended. 3. No suspicious lymphadenopathy bilaterally.   10/07/2018  Cancer Staging   Staging form: Breast, AJCC 8th Edition - Clinical stage from 10/07/2018: cT2, cN0, cM0, GX, ER+, PR+, HER2: Equivocal - Signed by [name], MD on 10/12/2018    10/07/2018 Initial Biopsy   Diagnosis 10/07/18 1. Breast, right, needle core biopsy, 1 o'clock, 2cmfn - INVASIVE MAMMARY CARCINOMA WITH CALCIFICATIONS. SEE NOTE 2. Breast, left, needle core biopsy, 10 o'clock - FIBROADENOMA WITH DYSTROPHIC CALCIFICATIONS - NEGATIVE  FOR CARCINOMA   10/07/2018 Receptors her2   By immunohistochemistry, the tumor cells are EQUIVOCAL for Her2 (2+). HER2 by [place] will be PERFORMED and the RESULTS REPORTED SEPARATELY Estrogen Receptor: 100%, POSITIVE, STRONG STAINING INTENSITY Progesterone Receptor: 70%, POSITIVE, STRONG STAINING INTENSITY Proliferation Marker Ki67: 10%   10/11/2018 Initial Diagnosis   Malignant neoplasm of upper-inner quadrant of right breast in female, estrogen receptor positive ([place])   10/2018 -  Neo-Adjuvant Anti-estrogen oral therapy   She started anastrozole end of 10/2018 before surgery.   11/25/2018 Surgery   BILATERAL BREAST LUMPECTOMIES  WITH BILATERAL  RADIOACTIVE SEEDS AND RIGHT SENTINEL LYMPH NODE BIOPSY by Dr [name]  11/24/28   11/25/2018 Pathology Results   Diagnosis 11/25/18 1. Breast, lumpectomy, Left w/seed - FIBROADENOMA WITH CALCIFICATIONS. - BIOPSY SITE CHANGES. 2. Lymph node, sentinel, biopsy, Right Axillary #1 - ONE LYMPH NODE, NEGATIVE FOR CARCINOMA (0/1). 3. Lymph node, sentinel, biopsy, Right Axillary #2 - ONE LYMPH NODE, NEGATIVE FOR CARCINOMA (0/1). 4. Lymph node, sentinel, biopsy, Right - ONE LYMPH NODE, NEGATIVE FOR CARCINOMA (0/1). 5. Breast, lumpectomy, Right w/seed - INVASIVE DUCTAL CARCINOMA, 2.6 CM, NOTTINGHAM GRADE 2 OF 3. - MARGINS OF RESECTION ARE NOT INVOLVED (CLOSEST MARGIN: LESS THAN 1 MM, SUPERIOR). - BIOPSY SITE CHANGES. - SEE ONCOLOGY TABLE. 6. Medical device, removal, radioactive seed - RADIOACTIVE SEED. - GROSS ONLY.   11/25/2018 Oncotype testing   Oncotype 11/25/18 Recurrence Score 21 with distant recurrence risk at 9 years of with Tamoxifen alone 7%.  There is less than 1% benefit from chemo.    12/25/2018 Cancer Staging   Staging form: Breast, AJCC 8th Edition - Pathologic stage from 12/25/2018: Stage IA (pT2, pN0, cM0, G2, ER+, PR+, HER2-, Oncotype DX score: 21) - Signed by [name], MD on 02/01/2019    01/11/2019 - 02/08/2019 Radiation Therapy    Adjuvant Radiation with Dr [name] 01/11/19-02/08/19   05/05/2019 Survivorship   SCP delivered by Cira Rue, NP       INTERVAL HISTORY:  Elizabeth Mcfarland is here for a follow up of breast cancer. She was last seen by NP Lacie on 10/03/20. She presents to the clinic alone. She reports she is tired a lot. She notes she is scheduled to undergo heart catheterization on 04/08/21.   All other systems were reviewed with the patient and are negative.  MEDICAL HISTORY:  Past Medical History:  Diagnosis Date   Allergy    Anemia    Blood transfusion without reported diagnosis    Breast cancer ([place]) 11/2018   Right breast invasive mammary carcinoma   Cancer ([place])    Cataract    Chronic kidney disease    Diabetes mellitus without complication ([place])    Esophageal ulcer with bleeding 05/05/2013   05/05/2013 EGD linear distal esophageal ulcer - cause of hematemesis prior to EGD    GERD (gastroesophageal reflux disease)    History of kidney stones    Hyperlipidemia    Hypertension    Kidney cysts    Personal history of radiation therapy    Sarcoidosis 1970  SURGICAL HISTORY: Past Surgical History:  Procedure Laterality Date   ABDOMINAL HYSTERECTOMY     BREAST EXCISIONAL BIOPSY Left 11/2018   Fibroadenoma   BREAST LUMPECTOMY     BREAST LUMPECTOMY WITH RADIOACTIVE SEED AND SENTINEL LYMPH NODE BIOPSY Bilateral 11/25/2018   Procedure: BILATERAL BREAST LUMPECTOMIES  WITH BILATERAL  RADIOACTIVE SEEDS AND RIGHT SENTINEL LYMPH NODE BIOPSY;  Surgeon: [name], MD;  Location: [facility];  Service: General;  Laterality: Bilateral;   CATARACT EXTRACTION W/ INTRAOCULAR LENS IMPLANT     COLONOSCOPY     COLONOSCOPY, ESOPHAGOGASTRODUODENOSCOPY (EGD) AND ESOPHAGEAL DILATION N/A 05/05/2013   Procedure: COLONOSCOPY, ESOPHAGOGASTRODUODENOSCOPY (EGD) AND ESOPHAGEAL DILATION (ED);  Surgeon: Gatha Mayer, MD;  Location: [facility];  Service: Endoscopy;  Laterality: N/A;   LITHOTRIPSY     POLYPECTOMY      UPPER GASTROINTESTINAL ENDOSCOPY      I have reviewed the social history and family history with the patient and they are unchanged from previous note.  ALLERGIES:  is allergic to codeine and percocet [oxycodone-acetaminophen].  MEDICATIONS:  Current Outpatient Medications  Medication Sig Dispense Refill   acetaminophen (TYLENOL) 500 MG tablet Take 1,000 mg by mouth every 6 (six) hours as needed for mild pain or moderate pain.     amLODipine (NORVASC) 10 MG tablet TAKE 1 TABLET BY MOUTH EVERY DAY 90 tablet 1   anastrozole (ARIMIDEX) 1 MG tablet TAKE 1 TABLET(1 MG) BY MOUTH DAILY 30 tablet 3   Ascorbic Acid (VITAMIN C) 1000 MG tablet Take 1,000 mg by mouth daily.     aspirin EC 81 MG tablet Take 81 mg by mouth daily.     atorvastatin (LIPITOR) 80 MG tablet Take 1 tablet (80 mg total) by mouth daily. 90 tablet 3   cholecalciferol (VITAMIN D) 1000 UNITS tablet Take 1,000 Units by mouth daily.      glimepiride (AMARYL) 4 MG tablet TAKE 1 TABLET BY MOUTH EVERY MORNING AND 1/2 TABLET AT BEDTIME 135 tablet 1   Multiple Vitamin (MULTIVITAMIN WITH MINERALS) TABS tablet Take 1 tablet by mouth daily.      olmesartan (BENICAR) 40 MG tablet TAKE 1 TABLET(40 MG) BY MOUTH DAILY 90 tablet 1   omeprazole (PRILOSEC) 20 MG capsule TAKE 1 CAPSULE(20 MG) BY MOUTH DAILY 90 capsule 1   ONETOUCH ULTRA test strip USE AS DIRECTED TWICE DAILY 100 strip 3   Polyethyl Glycol-Propyl Glycol (SYSTANE OP) Place 1 drop into both eyes 2 (two) times daily.     polyethylene glycol (MIRALAX / GLYCOLAX) packet Take 17 g by mouth daily.      No current facility-administered medications for this visit.    PHYSICAL EXAMINATION: ECOG PERFORMANCE STATUS: 1 - Symptomatic but completely ambulatory  Vitals:   04/05/21 1137  BP: (!) 151/68  Pulse: 80  Resp: 18  Temp: 98.3 F (36.8 C)  SpO2: 98%   Wt Readings from Last 3 Encounters:  04/05/21 129 lb 2 oz (58.6 kg)  04/01/21 128 lb 6.4 oz (58.2 kg)  03/08/21 129 lb 3.2 oz  (58.6 kg)     GENERAL:alert, no distress and comfortable SKIN: skin color, texture, turgor are normal, no rashes or significant lesions EYES: normal, Conjunctiva are pink and non-injected, sclera clear  NECK: supple, thyroid normal size, non-tender, without nodularity LYMPH:  no palpable lymphadenopathy in the cervical, axillary  LUNGS: clear to auscultation and percussion with normal breathing effort HEART: regular rate & rhythm and no murmurs and no lower extremity edema ABDOMEN:abdomen soft, non-tender and  normal bowel sounds Musculoskeletal:no cyanosis of digits and no clubbing  NEURO: alert & oriented x 3 with fluent speech, no focal motor/sensory deficits BREAST: (+) palpable 3 x 3 cm nodule in UOQ left breast. Right breast benign.  LABORATORY DATA:  I have reviewed the data as listed CBC Latest Ref Rng & Units 04/05/2021 04/01/2021 12/04/2020  WBC 4.0 - 10.5 K/uL 6.5 6.2 5.3  Hemoglobin 12.0 - 15.0 g/dL 11.2(L) 12.5 12.3  Hematocrit 36.0 - 46.0 % 34.9(L) 37.8 37.1  Platelets 150 - 400 K/uL 232 299 214     CMP Latest Ref Rng & Units 04/05/2021 03/22/2021 12/04/2020  Glucose 70 - 99 mg/dL 257(H) 266(H) 143(H)  BUN 8 - 23 mg/dL '14 11 18  ' Creatinine 0.44 - 1.00 mg/dL 0.84 0.76 0.84  Sodium 135 - 145 mmol/L 138 136 139  Potassium 3.5 - 5.1 mmol/L 4.1 4.2 4.9  Chloride 98 - 111 mmol/L 102 97 100  CO2 22 - 32 mmol/L '31 27 27  ' Calcium 8.9 - 10.3 mg/dL 9.2 9.6 10.2  Total Protein 6.5 - 8.1 g/dL 6.7 - 7.0  Total Bilirubin 0.3 - 1.2 mg/dL 0.8 - 0.4  Alkaline Phos 38 - 126 U/L 70 - 85  AST 15 - 41 U/L 24 - 26  ALT 0 - 44 U/L 23 - 28      RADIOGRAPHIC STUDIES: I have personally reviewed the radiological images as listed and agreed with the findings in the report. ECHOCARDIOGRAM COMPLETE  Result Date: 04/04/2021    ECHOCARDIOGRAM REPORT   Patient Name:   Elizabeth Mcfarland Date of Exam: 04/04/2021 Medical Rec #:  [id number]             Height:       62.0 in Accession #:     [id number]            Weight:       128.4 lb Date of Birth:  12/19/1940             BSA:          1.584 m Patient Age:    80 years              BP:           138/72 mmHg Patient Gender: F                     HR:           63 bpm. Exam Location:  [facility] Procedure: 2D Echo, 3D Echo, Cardiac Doppler and Color Doppler Indications:    R06.00 Dyspnea  History:        Patient has prior history of Echocardiogram examinations, most                 recent 01/04/2016. Signs/Symptoms:Dyspnea, Fatigue and Shortness                 of Breath; Risk Factors:Family History of Coronary Artery                 Disease, Hypertension, Diabetes and Dyslipidemia. Right Breast                 Cancer status post Lumpectomy and Radiation (2020), Sarcoidosis                 (1970), Chest Discomfort.  Sonographer:    Deliah Boston RDCS Referring Phys: [facility] [name] IMPRESSIONS  1. Left ventricular ejection fraction, by estimation, is 70  to 75%. The left ventricle has hyperdynamic function. The left ventricle has no regional wall motion abnormalities. Left ventricular diastolic parameters were normal.  2. Right ventricular systolic function is normal. The right ventricular size is normal. There is normal pulmonary artery systolic pressure.  3. Right atrial size was mildly dilated.  4. The mitral valve is normal in structure. No evidence of mitral valve regurgitation.  5. The aortic valve is normal in structure. Aortic valve regurgitation is not visualized. No aortic stenosis is present. FINDINGS  Left Ventricle: Left ventricular ejection fraction, by estimation, is 70 to 75%. The left ventricle has hyperdynamic function. The left ventricle has no regional wall motion abnormalities. The left ventricular internal cavity size was normal in size. There is no left ventricular hypertrophy. Left ventricular diastolic parameters were normal. Right Ventricle: The right ventricular size is normal. Right vetricular wall thickness was not well  visualized. Right ventricular systolic function is normal. There is normal pulmonary artery systolic pressure. The tricuspid regurgitant velocity is 2.09 m/s, and with an assumed right atrial pressure of 3 mmHg, the estimated right ventricular systolic pressure is 86.7 mmHg. Left Atrium: Left atrial size was normal in size. Right Atrium: Right atrial size was mildly dilated. Pericardium: There is no evidence of pericardial effusion. Mitral Valve: The mitral valve is normal in structure. No evidence of mitral valve regurgitation. Tricuspid Valve: The tricuspid valve is grossly normal. Tricuspid valve regurgitation is not demonstrated. Aortic Valve: The aortic valve is normal in structure. Aortic valve regurgitation is not visualized. No aortic stenosis is present. Pulmonic Valve: The pulmonic valve was not well visualized. Pulmonic valve regurgitation is not visualized. Aorta: The aortic root and ascending aorta are structurally normal, with no evidence of dilitation. IAS/Shunts: The atrial septum is grossly normal.  LEFT VENTRICLE PLAX 2D LVIDd:         4.30 cm   Diastology LVIDs:         2.20 cm   LV e' medial:    8.05 cm/s LV PW:         0.90 cm   LV E/e' medial:  7.8 LV IVS:        0.70 cm   LV e' lateral:   11.90 cm/s LVOT diam:     2.00 cm   LV E/e' lateral: 5.2 LV SV:         61 LV SV Index:   38 LVOT Area:     3.14 cm                           3D Volume EF:                          3D EF:        70 %                          LV EDV:       113 ml                          LV ESV:       34 ml                          LV SV:        79 ml RIGHT VENTRICLE RV S prime:  15.00 cm/s TAPSE (M-mode): 2.3 cm LEFT ATRIUM             Index        RIGHT ATRIUM           Index LA diam:        2.70 cm 1.71 cm/m   RA Area:     18.00 cm LA Vol (A2C):   44.3 ml 27.98 ml/m  RA Volume:   48.10 ml  30.38 ml/m LA Vol (A4C):   43.8 ml 27.66 ml/m LA Biplane Vol: 43.5 ml 27.47 ml/m  AORTIC VALVE LVOT Vmax:   86.95 cm/s LVOT  Vmean:  56.050 cm/s LVOT VTI:    0.194 m  AORTA Ao Root diam: 3.20 cm Ao Asc diam:  3.20 cm MITRAL VALVE               TRICUSPID VALVE MV Area (PHT): cm         TR Peak grad:   17.5 mmHg MV Decel Time: 250 msec    TR Vmax:        209.00 cm/s MV E velocity: 62.40 cm/s MV A velocity: 93.80 cm/s  SHUNTS MV E/A ratio:  0.67        Systemic VTI:  0.19 m                            Systemic Diam: 2.00 cm Mertie Moores MD Electronically signed by Mertie Moores MD Signature Date/Time: 04/04/2021/2:27:42 PM    Final       No orders of the defined types were placed in this encounter.  All questions were answered. The patient knows to call the clinic with any problems, questions or concerns. No barriers to learning was detected. The total time spent in the appointment was 25 minutes.     [name], MD 04/05/2021   I, Wilburn Mylar, am acting as scribe for [name], MD.   I have reviewed the above documentation for accuracy and completeness, and I agree with the above.

## 2021-04-06 ENCOUNTER — Encounter: Payer: Self-pay | Admitting: Hematology

## 2021-04-08 ENCOUNTER — Ambulatory Visit (HOSPITAL_COMMUNITY)
Admission: RE | Disposition: A | Discharge status: Home / Self Care | Payer: Self-pay | Source: Home / Self Care | Attending: Cardiovascular Disease

## 2021-04-08 ENCOUNTER — Ambulatory Visit (HOSPITAL_COMMUNITY)
Admission: RE | Admit: 2021-04-08 | Discharge: 2021-04-09 | Disposition: A | Discharge status: Home / Self Care | Payer: Medicare Other | Attending: Cardiovascular Disease | Admitting: Cardiovascular Disease

## 2021-04-08 ENCOUNTER — Other Ambulatory Visit: Payer: Self-pay

## 2021-04-08 ENCOUNTER — Encounter (HOSPITAL_COMMUNITY): Payer: Self-pay | Admitting: Cardiovascular Disease

## 2021-04-08 ENCOUNTER — Other Ambulatory Visit (HOSPITAL_COMMUNITY): Payer: Medicare Other

## 2021-04-08 DIAGNOSIS — Z20822 Contact with and (suspected) exposure to covid-19: Secondary | ICD-10-CM | POA: Diagnosis not present

## 2021-04-08 DIAGNOSIS — I25118 Atherosclerotic heart disease of native coronary artery with other forms of angina pectoris: Secondary | ICD-10-CM | POA: Diagnosis not present

## 2021-04-08 DIAGNOSIS — I25119 Atherosclerotic heart disease of native coronary artery with unspecified angina pectoris: Secondary | ICD-10-CM | POA: Diagnosis present

## 2021-04-08 DIAGNOSIS — E1122 Type 2 diabetes mellitus with diabetic chronic kidney disease: Secondary | ICD-10-CM | POA: Diagnosis not present

## 2021-04-08 DIAGNOSIS — R0609 Other forms of dyspnea: Secondary | ICD-10-CM | POA: Diagnosis not present

## 2021-04-08 DIAGNOSIS — I129 Hypertensive chronic kidney disease with stage 1 through stage 4 chronic kidney disease, or unspecified chronic kidney disease: Secondary | ICD-10-CM | POA: Diagnosis not present

## 2021-04-08 DIAGNOSIS — I700 Atherosclerosis of aorta: Secondary | ICD-10-CM | POA: Diagnosis not present

## 2021-04-08 DIAGNOSIS — R931 Abnormal findings on diagnostic imaging of heart and coronary circulation: Secondary | ICD-10-CM | POA: Diagnosis present

## 2021-04-08 DIAGNOSIS — Z853 Personal history of malignant neoplasm of breast: Secondary | ICD-10-CM | POA: Diagnosis not present

## 2021-04-08 DIAGNOSIS — Z923 Personal history of irradiation: Secondary | ICD-10-CM | POA: Diagnosis not present

## 2021-04-08 DIAGNOSIS — N189 Chronic kidney disease, unspecified: Secondary | ICD-10-CM | POA: Diagnosis not present

## 2021-04-08 DIAGNOSIS — I2510 Atherosclerotic heart disease of native coronary artery without angina pectoris: Secondary | ICD-10-CM

## 2021-04-08 DIAGNOSIS — E782 Mixed hyperlipidemia: Secondary | ICD-10-CM | POA: Diagnosis not present

## 2021-04-08 DIAGNOSIS — Z955 Presence of coronary angioplasty implant and graft: Secondary | ICD-10-CM | POA: Insufficient documentation

## 2021-04-08 HISTORY — PX: LEFT HEART CATH AND CORONARY ANGIOGRAPHY: CATH118249

## 2021-04-08 LAB — POCT ACTIVATED CLOTTING TIME
Activated Clotting Time: 227 seconds
Activated Clotting Time: 287 seconds

## 2021-04-08 LAB — HEMOGLOBIN A1C
Hgb A1c MFr Bld: 7.7 % — ABNORMAL HIGH (ref 4.8–5.6)
Mean Plasma Glucose: 174.29 mg/dL

## 2021-04-08 LAB — GLUCOSE, CAPILLARY
Glucose-Capillary: 104 mg/dL — ABNORMAL HIGH (ref 70–99)
Glucose-Capillary: 57 mg/dL — ABNORMAL LOW (ref 70–99)
Glucose-Capillary: 151 mg/dL — ABNORMAL HIGH (ref 70–99)
Glucose-Capillary: 211 mg/dL — ABNORMAL HIGH (ref 70–99)
Glucose-Capillary: 219 mg/dL — ABNORMAL HIGH (ref 70–99)

## 2021-04-08 LAB — SARS CORONAVIRUS 2 BY RT PCR (HOSPITAL ORDER, PERFORMED IN ~~LOC~~ HOSPITAL LAB): SARS Coronavirus 2: NEGATIVE

## 2021-04-08 SURGERY — LEFT HEART CATH AND CORONARY ANGIOGRAPHY
Anesthesia: LOCAL

## 2021-04-08 MED ORDER — FENTANYL CITRATE (PF) 100 MCG/2ML IJ SOLN
INTRAMUSCULAR | Status: AC
  Filled 2021-04-08: qty 2

## 2021-04-08 MED ORDER — NITROGLYCERIN 1 MG/10 ML FOR IR/CATH LAB
INTRA_ARTERIAL | Status: AC
  Filled 2021-04-08: qty 10

## 2021-04-08 MED ORDER — FENTANYL CITRATE (PF) 100 MCG/2ML IJ SOLN
INTRAMUSCULAR | Status: DC | PRN
  Administered 2021-04-08: 25 ug via INTRAVENOUS

## 2021-04-08 MED ORDER — SODIUM CHLORIDE 0.9 % WEIGHT BASED INFUSION
3.0000 mL/kg/h | INTRAVENOUS | Status: DC
  Administered 2021-04-08: 3 mL/kg/h via INTRAVENOUS

## 2021-04-08 MED ORDER — IOHEXOL 350 MG/ML SOLN
INTRAVENOUS | Status: DC | PRN
  Administered 2021-04-08: 95 mL via INTRA_ARTERIAL

## 2021-04-08 MED ORDER — ADULT MULTIVITAMIN W/MINERALS CH
1.0000 | ORAL_TABLET | Freq: Every day | ORAL | Status: DC
  Administered 2021-04-09: 1 via ORAL
  Filled 2021-04-08: qty 1

## 2021-04-08 MED ORDER — IRBESARTAN 150 MG PO TABS: 300.0000 mg | ORAL_TABLET | Freq: Every day | ORAL | Status: DC

## 2021-04-08 MED ORDER — SODIUM CHLORIDE 0.9% FLUSH
3.0000 mL | Freq: Two times a day (BID) | INTRAVENOUS | Status: DC
  Administered 2021-04-08 – 2021-04-09 (×2): 3 mL via INTRAVENOUS

## 2021-04-08 MED ORDER — ATORVASTATIN CALCIUM 80 MG PO TABS
80.0000 mg | ORAL_TABLET | Freq: Every day | ORAL | Status: DC
  Administered 2021-04-09: 80 mg via ORAL
  Filled 2021-04-08 (×2): qty 1

## 2021-04-08 MED ORDER — SODIUM CHLORIDE 0.9 % WEIGHT BASED INFUSION: 1.0000 mL/kg/h | INTRAVENOUS | Status: DC

## 2021-04-08 MED ORDER — ONDANSETRON HCL 4 MG/2ML IJ SOLN: 4.0000 mg | Freq: Four times a day (QID) | INTRAMUSCULAR | Status: DC | PRN

## 2021-04-08 MED ORDER — SODIUM CHLORIDE 0.9% FLUSH: 3.0000 mL | INTRAVENOUS | Status: DC | PRN

## 2021-04-08 MED ORDER — SODIUM CHLORIDE 0.9 % IV SOLN: 250.0000 mL | INTRAVENOUS | Status: DC | PRN

## 2021-04-08 MED ORDER — ANASTROZOLE 1 MG PO TABS
1.0000 mg | ORAL_TABLET | Freq: Every day | ORAL | Status: DC
Start: 2021-04-09 — End: 2021-04-09
  Administered 2021-04-09: 1 mg via ORAL
  Filled 2021-04-08: qty 1

## 2021-04-08 MED ORDER — MIDAZOLAM HCL 2 MG/2ML IJ SOLN
INTRAMUSCULAR | Status: DC | PRN
  Administered 2021-04-08: 1 mg via INTRAVENOUS

## 2021-04-08 MED ORDER — POLYETHYLENE GLYCOL 3350 17 G PO PACK
17.0000 g | PACK | Freq: Every day | ORAL | Status: DC
  Administered 2021-04-08 – 2021-04-09 (×2): 17 g via ORAL
  Filled 2021-04-08 (×2): qty 1

## 2021-04-08 MED ORDER — LABETALOL HCL 5 MG/ML IV SOLN: 10.0000 mg | INTRAVENOUS | Status: AC | PRN

## 2021-04-08 MED ORDER — HEPARIN SODIUM (PORCINE) 1000 UNIT/ML IJ SOLN
INTRAMUSCULAR | Status: DC | PRN
  Administered 2021-04-08: 5000 [IU] via INTRAVENOUS
  Administered 2021-04-08: 3000 [IU] via INTRAVENOUS

## 2021-04-08 MED ORDER — LIDOCAINE HCL (PF) 1 % IJ SOLN
INTRAMUSCULAR | Status: AC
  Filled 2021-04-08: qty 30

## 2021-04-08 MED ORDER — HYDRALAZINE HCL 20 MG/ML IJ SOLN: 10.0000 mg | INTRAMUSCULAR | Status: AC | PRN

## 2021-04-08 MED ORDER — GLIMEPIRIDE 4 MG PO TABS
4.0000 mg | ORAL_TABLET | Freq: Every day | ORAL | Status: DC
  Administered 2021-04-09: 4 mg via ORAL
  Filled 2021-04-08: qty 1

## 2021-04-08 MED ORDER — CLOPIDOGREL BISULFATE 75 MG PO TABS
75.0000 mg | ORAL_TABLET | Freq: Every day | ORAL | Status: DC
  Administered 2021-04-09: 75 mg via ORAL
  Filled 2021-04-08: qty 1

## 2021-04-08 MED ORDER — HEPARIN (PORCINE) IN NACL 1000-0.9 UT/500ML-% IV SOLN
INTRAVENOUS | Status: AC
  Filled 2021-04-08: qty 1000

## 2021-04-08 MED ORDER — AMLODIPINE BESYLATE 10 MG PO TABS
10.0000 mg | ORAL_TABLET | Freq: Every day | ORAL | Status: DC
  Administered 2021-04-09: 10 mg via ORAL
  Filled 2021-04-08: qty 1

## 2021-04-08 MED ORDER — LIDOCAINE HCL (PF) 1 % IJ SOLN
INTRAMUSCULAR | Status: DC | PRN
  Administered 2021-04-08: 8 mL

## 2021-04-08 MED ORDER — CLOPIDOGREL BISULFATE 300 MG PO TABS
ORAL_TABLET | ORAL | Status: AC
  Filled 2021-04-08: qty 2

## 2021-04-08 MED ORDER — IRBESARTAN 150 MG PO TABS
300.0000 mg | ORAL_TABLET | Freq: Every day | ORAL | Status: DC
  Administered 2021-04-09: 300 mg via ORAL
  Filled 2021-04-08: qty 2

## 2021-04-08 MED ORDER — GLIMEPIRIDE 4 MG PO TABS: 4.0000 mg | ORAL_TABLET | Freq: Every day | ORAL | Status: DC

## 2021-04-08 MED ORDER — CLOPIDOGREL BISULFATE 300 MG PO TABS
ORAL_TABLET | ORAL | Status: DC | PRN
  Administered 2021-04-08: 600 mg via ORAL

## 2021-04-08 MED ORDER — ASPIRIN EC 81 MG PO TBEC
81.0000 mg | DELAYED_RELEASE_TABLET | Freq: Every day | ORAL | Status: DC
  Administered 2021-04-09: 81 mg via ORAL
  Filled 2021-04-08: qty 1

## 2021-04-08 MED ORDER — ASPIRIN 81 MG PO CHEW: 81.0000 mg | CHEWABLE_TABLET | ORAL | Status: DC

## 2021-04-08 MED ORDER — SODIUM CHLORIDE 0.9 % WEIGHT BASED INFUSION: 1.0000 mL/kg/h | INTRAVENOUS | Status: AC

## 2021-04-08 MED ORDER — GLIMEPIRIDE 2 MG PO TABS
2.0000 mg | ORAL_TABLET | Freq: Every day | ORAL | Status: DC
  Administered 2021-04-08: 2 mg via ORAL
  Filled 2021-04-08 (×2): qty 1

## 2021-04-08 MED ORDER — HEPARIN (PORCINE) IN NACL 1000-0.9 UT/500ML-% IV SOLN
INTRAVENOUS | Status: DC | PRN
  Administered 2021-04-08 (×2): 500 mL

## 2021-04-08 MED ORDER — ACETAMINOPHEN 500 MG PO TABS: 1000.0000 mg | ORAL_TABLET | Freq: Four times a day (QID) | ORAL | Status: DC | PRN

## 2021-04-08 MED ORDER — HEPARIN SODIUM (PORCINE) 1000 UNIT/ML IJ SOLN
INTRAMUSCULAR | Status: AC
  Filled 2021-04-08: qty 10

## 2021-04-08 MED ORDER — MIDAZOLAM HCL 2 MG/2ML IJ SOLN
INTRAMUSCULAR | Status: AC
  Filled 2021-04-08: qty 2

## 2021-04-08 MED ORDER — INSULIN ASPART 100 UNIT/ML IJ SOLN: 0.0000 [IU] | Freq: Three times a day (TID) | INTRAMUSCULAR | Status: DC

## 2021-04-08 MED ORDER — SODIUM CHLORIDE 0.9% FLUSH
3.0000 mL | Freq: Two times a day (BID) | INTRAVENOUS | Status: DC
  Administered 2021-04-09: 3 mL via INTRAVENOUS

## 2021-04-08 MED ORDER — PANTOPRAZOLE SODIUM 40 MG PO TBEC
40.0000 mg | DELAYED_RELEASE_TABLET | Freq: Every day | ORAL | Status: DC
  Administered 2021-04-09: 40 mg via ORAL
  Filled 2021-04-08: qty 1

## 2021-04-08 SURGICAL SUPPLY — 19 items
BALLN EMERGE MR 2.5X12 (BALLOONS) ×2
BALLN SAPPHIRE ~~LOC~~ 3.25X10 (BALLOONS) ×1 IMPLANT
BALLOON EMERGE MR 2.5X12 (BALLOONS) IMPLANT
CATH INFINITI 5FR MULTPACK ANG (CATHETERS) ×1 IMPLANT
CATH VISTA GUIDE 6FR XBLAD3.5 (CATHETERS) ×1 IMPLANT
CLOSURE PERCLOSE PROSTYLE (VASCULAR PRODUCTS) ×1 IMPLANT
KIT ENCORE 26 ADVANTAGE (KITS) ×1 IMPLANT
KIT HEART LEFT (KITS) ×2 IMPLANT
KIT HEMO VALVE WATCHDOG (MISCELLANEOUS) ×1 IMPLANT
KIT MICROPUNCTURE NIT STIFF (SHEATH) ×1 IMPLANT
PACK CARDIAC CATHETERIZATION (CUSTOM PROCEDURE TRAY) ×2 IMPLANT
SHEATH PINNACLE 5F 10CM (SHEATH) ×1 IMPLANT
SHEATH PINNACLE 6F 10CM (SHEATH) ×1 IMPLANT
SHEATH PROBE COVER 6X72 (BAG) ×1 IMPLANT
STENT ONYX FRONTIER 3.0X15 (Permanent Stent) ×1 IMPLANT
TRANSDUCER W/STOPCOCK (MISCELLANEOUS) ×2 IMPLANT
TUBING CIL FLEX 10 FLL-RA (TUBING) ×2 IMPLANT
WIRE COUGAR XT STRL 190CM (WIRE) ×1 IMPLANT
WIRE EMERALD 3MM-J .035X150CM (WIRE) ×1 IMPLANT

## 2021-04-08 NOTE — Interval H&P Note (Signed)
Cath Lab Visit (complete for each Cath Lab visit)  Clinical Evaluation Leading to the Procedure:   ACS: No.  Non-ACS:    Anginal Classification: CCS II  Anti-ischemic medical therapy: Minimal Therapy (1 class of medications)  Non-Invasive Test Results: Intermediate-risk stress test findings: cardiac mortality 1-3%/year  Prior CABG: No previous CABG      History and Physical Interval Note:  04/08/2021 8:58 AM  Elizabeth Mcfarland  has presented today for surgery, with the diagnosis of abnormal ct.  The various methods of treatment have been discussed with the patient and family. After consideration of risks, benefits and other options for treatment, the patient has consented to  Procedure(s): LEFT HEART CATH AND CORONARY ANGIOGRAPHY (N/A) as a surgical intervention.  The patient's history has been reviewed, patient examined, no change in status, stable for surgery.  I have reviewed the patient's chart and labs.  Questions were answered to the patient's satisfaction.     Sherren Mocha

## 2021-04-09 ENCOUNTER — Ambulatory Visit: Payer: Medicare Other | Admitting: Nurse Practitioner

## 2021-04-09 ENCOUNTER — Other Ambulatory Visit (HOSPITAL_COMMUNITY): Payer: Self-pay

## 2021-04-09 ENCOUNTER — Other Ambulatory Visit: Payer: Self-pay | Admitting: Cardiology

## 2021-04-09 DIAGNOSIS — I25119 Atherosclerotic heart disease of native coronary artery with unspecified angina pectoris: Secondary | ICD-10-CM

## 2021-04-09 DIAGNOSIS — Z955 Presence of coronary angioplasty implant and graft: Secondary | ICD-10-CM | POA: Diagnosis not present

## 2021-04-09 DIAGNOSIS — I25118 Atherosclerotic heart disease of native coronary artery with other forms of angina pectoris: Secondary | ICD-10-CM | POA: Diagnosis not present

## 2021-04-09 DIAGNOSIS — I129 Hypertensive chronic kidney disease with stage 1 through stage 4 chronic kidney disease, or unspecified chronic kidney disease: Secondary | ICD-10-CM | POA: Diagnosis not present

## 2021-04-09 DIAGNOSIS — R931 Abnormal findings on diagnostic imaging of heart and coronary circulation: Secondary | ICD-10-CM

## 2021-04-09 DIAGNOSIS — Z20822 Contact with and (suspected) exposure to covid-19: Secondary | ICD-10-CM | POA: Diagnosis not present

## 2021-04-09 DIAGNOSIS — R0609 Other forms of dyspnea: Secondary | ICD-10-CM | POA: Diagnosis not present

## 2021-04-09 DIAGNOSIS — E1122 Type 2 diabetes mellitus with diabetic chronic kidney disease: Secondary | ICD-10-CM | POA: Diagnosis not present

## 2021-04-09 DIAGNOSIS — Z853 Personal history of malignant neoplasm of breast: Secondary | ICD-10-CM | POA: Diagnosis not present

## 2021-04-09 DIAGNOSIS — N189 Chronic kidney disease, unspecified: Secondary | ICD-10-CM | POA: Diagnosis not present

## 2021-04-09 DIAGNOSIS — I700 Atherosclerosis of aorta: Secondary | ICD-10-CM | POA: Diagnosis not present

## 2021-04-09 DIAGNOSIS — E782 Mixed hyperlipidemia: Secondary | ICD-10-CM | POA: Diagnosis not present

## 2021-04-09 DIAGNOSIS — Z923 Personal history of irradiation: Secondary | ICD-10-CM | POA: Diagnosis not present

## 2021-04-09 LAB — CBC
HCT: 33.6 % — ABNORMAL LOW (ref 36.0–46.0)
Hemoglobin: 11.2 g/dL — ABNORMAL LOW (ref 12.0–15.0)
MCH: 29.9 pg (ref 26.0–34.0)
MCHC: 33.3 g/dL (ref 30.0–36.0)
MCV: 89.8 fL (ref 80.0–100.0)
Platelets: 218 10*3/uL (ref 150–400)
RBC: 3.74 MIL/uL — ABNORMAL LOW (ref 3.87–5.11)
RDW: 12.3 % (ref 11.5–15.5)
WBC: 6.3 10*3/uL (ref 4.0–10.5)
nRBC: 0 % (ref 0.0–0.2)

## 2021-04-09 LAB — BASIC METABOLIC PANEL
Anion gap: 7 (ref 5–15)
BUN: 6 mg/dL — ABNORMAL LOW (ref 8–23)
CO2: 25 mmol/L (ref 22–32)
Calcium: 8.9 mg/dL (ref 8.9–10.3)
Chloride: 107 mmol/L (ref 98–111)
Creatinine, Ser: 0.62 mg/dL (ref 0.44–1.00)
GFR, Estimated: >60 mL/min (ref >60)
Glucose, Bld: 118 mg/dL — ABNORMAL HIGH (ref 70–99)
Potassium: 3.4 mmol/L — ABNORMAL LOW (ref 3.5–5.1)
Sodium: 139 mmol/L (ref 135–145)

## 2021-04-09 LAB — GLUCOSE, CAPILLARY: Glucose-Capillary: 95 mg/dL (ref 70–99)

## 2021-04-09 MED ORDER — CLOPIDOGREL BISULFATE 75 MG PO TABS
75.0000 mg | ORAL_TABLET | Freq: Every day | ORAL | 3 refills | Status: DC
  Filled 2021-04-09: qty 90, 90d supply, fill #0

## 2021-04-09 MED ORDER — NITROGLYCERIN 0.4 MG SL SUBL
0.4000 mg | SUBLINGUAL_TABLET | SUBLINGUAL | 1 refills | Status: DC | PRN
  Filled 2021-04-09: qty 25, 7d supply, fill #0

## 2021-04-09 MED ORDER — PANTOPRAZOLE SODIUM 40 MG PO TBEC
40.0000 mg | DELAYED_RELEASE_TABLET | Freq: Every day | ORAL | 3 refills | Status: DC
  Filled 2021-04-09: qty 90, 90d supply, fill #0

## 2021-04-09 MED ORDER — METOPROLOL SUCCINATE ER 25 MG PO TB24
12.5000 mg | ORAL_TABLET | Freq: Every day | ORAL | 3 refills | Status: DC
Start: 2021-04-09 — End: 2021-09-25
  Filled 2021-04-09: qty 30, 60d supply, fill #0

## 2021-04-09 MED ORDER — POTASSIUM CHLORIDE CRYS ER 20 MEQ PO TBCR
40.0000 meq | EXTENDED_RELEASE_TABLET | Freq: Once | ORAL | Status: AC
  Administered 2021-04-09: 40 meq via ORAL
  Filled 2021-04-09: qty 2

## 2021-04-09 NOTE — Progress Notes (Signed)
Inpatient Diabetes Program Recommendations  AACE/ADA: New Consensus Statement on Inpatient Glycemic Control  Target Ranges:  Prepandial:   less than 140 mg/dL      Peak postprandial:   less than 180 mg/dL (1-2 hours)      Critically ill patients:  140 - 180 mg/dL    Latest Reference Range & Units 04/09/21 07:51  Glucose-Capillary 70 - 99 mg/dL 95    Latest Reference Range & Units 04/08/21 07:11 04/08/21 11:51 04/08/21 12:45 04/08/21 17:06 04/08/21 21:30  Glucose-Capillary 70 - 99 mg/dL 104 (H) 57 (L) 151 (H) 211 (H) 219 (H)   Review of Glycemic Control  Diabetes history: DM2 Outpatient Diabetes medications: Amaryl 4 mg QAM, Amaryl 2 mg QHS Current orders for Inpatient glycemic control: Novolog 0-15 units TID with meals, Amaryl 4 mg QAM, Amaryl 2 mg QHS  Inpatient Diabetes Program Recommendations:    Oral DM medication: Glucose down to 57 mg/dl on 04/08/21. Patient received Amaryl 2 mg last night and already given Amaryl 4 mg this morning. If any further hypoglycemia noted, please discontinue Amaryl while inpatient.  Thanks, Barnie Alderman, RN, MSN, CDE Diabetes Coordinator Inpatient Diabetes Program [phone] (Team Pager from 8am to 5pm)

## 2021-04-09 NOTE — Plan of Care (Signed)

## 2021-04-09 NOTE — Plan of Care (Signed)

## 2021-04-09 NOTE — Discharge Summary (Addendum)
Discharge Summary    Patient ID: Elizabeth Mcfarland MRN: [id number]; DOB: [birth year]  Admit date: 04/08/2021 Discharge date: 04/09/2021  PCP:  [name], FNP   [facility] Providers Cardiologist:  [name], MD        Discharge Diagnoses    Principal Problem:   Abnormal cardiac CT angiography Active Problems:   Coronary artery disease involving native coronary artery with angina pectoris [facility])    Diagnostic Studies/Procedures    Cath on 04/08/2021    Mid LAD lesion is 75% stenosed.   A drug-eluting stent was successfully placed using a STENT ONYX FRONTIER 3.0X15.   Post intervention, there is a 0% residual stenosis.   The left ventricular systolic function is normal.   LV end diastolic pressure is normal.   1.  Severe single-vessel coronary artery disease with successful stenting of a 75% eccentric stenosis in the mid LAD treated with a 3.0 x 15 mm resolute Onyx DES, reducing the stenosis to 0%.  TIMI-3 flow at baseline and post PCI. 2.  Patent left main, left circumflex, and RCA without significant stenoses 3.  Low/normal LVEDP  The patient is loaded with clopidogrel 600 mg on the table.  She should take aspirin and clopidogrel together without interruption for 6 months which is the current guideline for elective PCI.  Overnight observation with plans for hospital discharge tomorrow morning.  Diagnostic Dominance: Right Intervention     History of Present Illness     Elizabeth Mcfarland is a 81 y.o. female with a hx of well-controlled hypertension and type 2 diabetes mellitus, recent right breast lumpectomy and radiation therapy in 2020 (no chemotherapy), history of nephrolithiasis, remote history of distal esophageal ulcer causing hematemesis in 2015, referred in consultation with complaints of shortness of breath, now returned to discuss the findings on coronary CT angiography.   She has had about 3 months of dyspnea on exertion and fatigue, but denies chest  tightness.  She has not had orthopnea, PND, edema, claudication, focal neurological events, falls, syncope, bleeding (has not had hematemesis since 2015).   Her coronary CT angiogram showed a markedly elevated calcium score (93rd percentile for age and gender).  In addition there was still CAD with a high-grade stenosis in the proximal-mid LAD artery with a confirmatory reduced FFR of 0.71.   In 2017 she underwent an echocardiogram and a pharmacological nuclear myocardial perfusion test.  The perfusion images were normal.  LVEF was normal and there were no major valvular abnormalities.  Doppler measurements suggested "grade 1 diastolic dysfunction".  On a CT of the abdomen and pelvis about a year ago there was evidence of advanced calcification in the abdominal aorta and in the coronary arteries.   Labs performed in September showed a normal hemoglobin of 12.3, negative screening for autoimmune disorders, normal ESR at 2, normal renal and liver tests.  Her most recent lipid profile showed a total cholesterol 199, excellent HDL at 107, LDL 86 and triglycerides 29.   She has never smoked.  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  She is originally from [place], [facility] Course     Consultants: None    Coronary artery disease: Also with notable atherosclerosis in the aorta.  Has a high-grade stenosis in the proximal-mid LAD, which could explain her exertional symptoms.  Recommend coronary angiography and if appropriate, angioplasty/stent.  This procedure has been fully reviewed with the patient and informed consent has  been obtained.  - Cath completed on 04/08/21 found a 75% stenosis of the mid LAD  - DES placed to mid LAD   - Recommended DAPT with aspirin and clopidogrel for at least 6 months   - Continue lipitor 80 mg daily   - Start metoprolol succinate 12.5 mg daily   - Echo on 04/04/21 showed LVEF 70-75%, normal LV diastolic parameters  - BMET  at follow up   Dyspnea on exertion:   - Echo on 04/04/21 showed LVEF 70-75%, hyperdynamic LV. Normal LV diastolic parameters   -  It appears that her dyspnea on exertion may be an anginal equivalent. Consider chronotropic incompetence as a cause of her exertional dyspnea and fatigue.  If LVEF symptoms do not improve following LAD revascularization we will pursue a 72-hour Holter monitor  HTN: Usually with excellent control.  - Continue amlodipine, olmesartan   - Started metoprolol succinate 12.27m daily   DM: Despite very lean body habitus, she has diabetes, but this is very well controlled.   - Continue home management   HLP: Despite an excellent HDL cholesterol, she has substantial CAD burden. - Continue atorvastatin to 80 mg daily.  Target LDL less than 70.  History of breast cancer status post XRT   Patient was seen and evaluated by Dr. PJohney Frameand deemed stable for discharge   Did the patient have an acute coronary syndrome (MI, NSTEMI, STEMI, etc) this admission?:  No                               Did the patient have a percutaneous coronary intervention (stent / angioplasty)?:  Yes.     Cath/PCI Registry Performance & Quality Measures: Aspirin prescribed? - Yes ADP Receptor Inhibitor (Plavix/Clopidogrel, Brilinta/Ticagrelor or Effient/Prasugrel) prescribed (includes medically managed patients)? - Yes High Intensity Statin (Lipitor 40-873mor Crestor 20-4039mprescribed? - Yes For EF <40%, was ACEI/ARB prescribed? - Not Applicable (EF >/= 40%41%or EF <40%, Aldosterone Antagonist (Spironolactone or Eplerenone) prescribed? - Not Applicable (EF >/= 40%03%ardiac Rehab Phase II ordered? - Yes       The patient will be scheduled for a TOC follow up appointment in 7-14 days.  A message has been sent to the [facility]uling [facility] at the office where the patient should be seen for follow up.  _____________  Discharge Vitals Blood pressure 108/63, pulse 71, temperature 98.5  F (36.9 C), temperature source Oral, resp. rate 20, height 5' 2" (1.575 m), weight 58.8 kg, SpO2 100 %.  Filed Weights   04/08/21 0708 04/08/21 1700  Weight: 58.5 kg 58.8 kg    Labs & Radiologic Studies    CBC Recent Labs    04/09/21 0342  WBC 6.3  HGB 11.2*  HCT 33.6*  MCV 89.8  PLT [id number] Metabolic Panel Recent Labs    04/09/21 0342  NA 139  K 3.4*  CL 107  CO2 25  GLUCOSE 118*  BUN 6*  CREATININE 0.62  CALCIUM 8.9   Liver Function Tests No results for input(s): AST, ALT, ALKPHOS, BILITOT, PROT, ALBUMIN in the last 72 hours. No results for input(s): LIPASE, AMYLASE in the last 72 hours. High Sensitivity Troponin:   No results for input(s): TROPONINIHS in the last 720 hours.  BNP Invalid input(s): POCBNP D-Dimer No results for input(s): DDIMER in the last 72 hours. Hemoglobin A1C Recent Labs    04/08/21 1800  HGBA1C 7.7*  Fasting Lipid Panel No results for input(s): CHOL, HDL, LDLCALC, TRIG, CHOLHDL, LDLDIRECT in the last 72 hours. Thyroid Function Tests No results for input(s): TSH, T4TOTAL, T3FREE, THYROIDAB in the last 72 hours.  Invalid input(s): FREET3 _____________  CARDIAC CATHETERIZATION  Result Date: 04/08/2021   Mid LAD lesion is 75% stenosed.   A drug-eluting stent was successfully placed using a STENT ONYX FRONTIER 3.0X15.   Post intervention, there is a 0% residual stenosis.   The left ventricular systolic function is normal.   LV end diastolic pressure is normal. 1.  Severe single-vessel coronary artery disease with successful stenting of a 75% eccentric stenosis in the mid LAD treated with a 3.0 x 15 mm resolute Onyx DES, reducing the stenosis to 0%.  TIMI-3 flow at baseline and post PCI. 2.  Patent left main, left circumflex, and RCA without significant stenoses 3.  Low/normal LVEDP The patient is loaded with clopidogrel 600 mg on the table.  She should take aspirin and clopidogrel together without interruption for 6 months which is the  current guideline for elective PCI.  Overnight observation with plans for hospital discharge tomorrow morning.   CT CORONARY MORPH W/CTA COR W/SCORE W/CA W/CM &/OR WO/CM  Addendum Date: 03/26/2021   ADDENDUM REPORT: 03/26/2021 13:42 CLINICAL DATA:  81 Year old African American Female EXAM: Cardiac/Coronary  CTA TECHNIQUE: The patient was scanned on a [employer]. FINDINGS: Scan was triggered in the descending thoracic aorta. Axial non-contrast 3 mm slices were carried out through the heart. The data set was analyzed on a dedicated work station and scored using the [place]. Gantry rotation speed was 250 msecs and collimation was .6 mm. 0.8 mg of sl NTG was given. The 3D data set was reconstructed in 5% intervals of the 67-82 % of the R-R cycle. Diastolic phases were analyzed on a dedicated work station using MPR, MIP and VRT modes. The patient received 95 cc of contrast. Aorta:  Normal size.  Aortic atherosclerosis.  No dissection. Main Pulmonary Artery: Normal size of the pulmonary artery. Aortic Valve:  Tri-leaflet.  Aortic valve calcium score 140. Coronary Arteries:  Normal coronary origin.  Right dominance. Coronary Calcium Score: Left main: 209 Left anterior descending artery: 265 Left circumflex artery: 146 Right coronary artery: 73 Ramus Intermedius: 61 Total: 754 Percentile: 93rd for age, sex, and race matched control. RCA is a large dominant artery that gives rise to PDA and PLA. Mild calcified plaques in the proximal and mid vessel. Left main is a large artery that gives rise to LAD, RI, and LCX arteries. Mild ostial mixed plaque with mild distal calcified plaque. LAD is a large vessel that gives rise to one large D1 Branch. Severe mixed plaque in the proximal-mid LAD. Mild calcified plaque in the LAD at the D1 takeoff. Mild calcified plaque in the mid D1 branch. Small myocardial bridge distal LAD. LCX is a non-dominant artery. Mild calcified plaques in the proximal and mid vessel.  There is a ramus intermedius vessel. Mild calcified plaques in the mid vessel. Other findings: Normal pulmonary vein drainage into the left atrium. Normal left atrial appendage without a thrombus. Small left atrial diverticulum. Small interatrial vestigial remnant- normal variant. Extra-cardiac findings: See attached radiology report for non-cardiac structures. Cardiac Motion- Slab artifact noted. IMPRESSION: 1. Coronary calcium score of 754. This was 93rd percentile for age, sex, and race matched control. 2. Normal coronary origin with right dominance. 3. CAD-RADS 4 Severe stenosis. (70-99% or > 50% left main). CT  FFR is recommended. Consider symptom-guided anti-ischemic pharmacotherapy as well as risk factor modification per guideline directed care. RECOMMENDATIONS: Coronary artery calcium (CAC) score is a strong predictor of incident coronary heart disease (CHD) and provides predictive information beyond traditional risk factors. CAC scoring is reasonable to use in the decision to withhold, postpone, or initiate statin therapy in intermediate-risk or selected borderline-risk asymptomatic adults (age 74-75 years and LDL-C >=70 to <190 mg/dL) who do not have diabetes or established atherosclerotic cardiovascular disease (ASCVD).* In intermediate-risk (10-year ASCVD risk >=7.5% to <20%) adults or selected borderline-risk (10-year ASCVD risk >=5% to <7.5%) adults in whom a CAC score is measured for the purpose of making a treatment decision the following recommendations have been made: If CAC = 0, it is reasonable to withhold statin therapy and reassess in 5 to 10 years, as long as higher risk conditions are absent (diabetes mellitus, family history of premature CHD in first degree relatives (males <55 years; females <65 years), cigarette smoking, LDL >=190 mg/dL or other independent risk factors). If CAC is 1 to 99, it is reasonable to initiate statin therapy for patients >=77 years of age. If CAC is >=100 or >=75th  percentile, it is reasonable to initiate statin therapy at any age. Cardiology referral should be considered for patients with CAC scores =400 or >=75th percentile. *2018 AHA/ACC/AACVPR/AAPA/ABC/ACPM/ADA/AGS/APhA/ASPC/NLA/PCNA Guideline on the Management of Blood Cholesterol: A Report of the American College of Cardiology/American Heart Association Task Force on Clinical Practice Guidelines. J Am Coll Cardiol. 2019;73(24):3168-3209. Rudean Haskell, MD Electronically Signed   By: Rudean Haskell M.D.   On: 03/26/2021 13:42   Result Date: 03/26/2021 EXAM: OVER-READ INTERPRETATION  CT CHEST The following report is an over-read performed by radiologist Dr. Rolm Baptise of [facility], [place] on 03/26/2021. This over-read does not include interpretation of cardiac or coronary anatomy or pathology. The coronary CTA interpretation by the cardiologist is attached. COMPARISON:  10/15/2009 FINDINGS: Vascular: Heart is normal size. Scattered aortic calcifications. No aneurysm. Mediastinum/Nodes: No adenopathy.  Small hiatal hernia. Lungs/Pleura: No confluent opacities or effusions. Upper Abdomen: Large cyst off the upper pole of the right kidney measures up to 7 cm. This is not imaged in its entirety. This previously measured 5.7 cm. Musculoskeletal: Chest wall soft tissues are unremarkable. No acute bony abnormality. IMPRESSION: Aortic atherosclerosis. Small hiatal hernia. 7 cm exophytic cyst off the upper pole of the right kidney, incompletely evaluated/imaged on this cardiac study. This likely reflects a benign slowly enlarging cyst. This could be further characterized with renal ultrasound. Electronically Signed: By: Rolm Baptise M.D. On: 03/26/2021 11:28   CT CORONARY FRACTIONAL FLOW RESERVE DATA PREP  Result Date: 03/26/2021 EXAM: CT-FFR ANALYSIS CLINICAL DATA:  Possibly obstructive coronary lesion mixed plaque in the proximal-mid LAD FINDINGS: CT-FFR analysis was performed on the original cardiac CT  angiogram dataset. Diagrammatic representation of the CT-FFR analysis is provided in a separate PDF document in PACS. This dictation was created using the PDF document and an interactive 3D model of the results. 3D model is not available in the EMR/PACS. Normal FFR range is >0.80. 1. Left Main: No significant functional stenosis, CT-FFR 0.99. 2. LAD: significant functional stenosis, CT-FFR 0.93-> 0.71 at LAD lesion. 3. LCX: No significant functional stenosis, CT-FFR 0.99. 4. RCA: No significant functional stenosis, CT-FFR 0.99. IMPRESSION: 1. CT FFR analysis shows evidence of significant functional stenosis in the LAD. Rudean Haskell MD Electronically Signed   By: Rudean Haskell M.D.   On: 03/26/2021 13:47  ECHOCARDIOGRAM COMPLETE  Result Date: 04/04/2021    ECHOCARDIOGRAM REPORT   Patient Name:   Fleda Pagel Date of Exam: 04/04/2021 Medical Rec #:  [id number]             Height:       62.0 in Accession #:    [id number]            Weight:       128.4 lb Date of Birth:  1940-10-25             BSA:          1.584 m Patient Age:    36 years              BP:           138/72 mmHg Patient Gender: F                     HR:           63 bpm. Exam Location:  [facility] Procedure: 2D Echo, 3D Echo, Cardiac Doppler and Color Doppler Indications:    R06.00 Dyspnea  History:        Patient has prior history of Echocardiogram examinations, most                 recent 01/04/2016. Signs/Symptoms:Dyspnea, Fatigue and Shortness                 of Breath; Risk Factors:Family History of Coronary Artery                 Disease, Hypertension, Diabetes and Dyslipidemia. Right Breast                 Cancer status post Lumpectomy and Radiation (2020), Sarcoidosis                 (1970), Chest Discomfort.  Sonographer:    Deliah Boston RDCS Referring Phys: [facility] [name] IMPRESSIONS  1. Left ventricular ejection fraction, by estimation, is 70 to 75%. The left ventricle has hyperdynamic function. The left  ventricle has no regional wall motion abnormalities. Left ventricular diastolic parameters were normal.  2. Right ventricular systolic function is normal. The right ventricular size is normal. There is normal pulmonary artery systolic pressure.  3. Right atrial size was mildly dilated.  4. The mitral valve is normal in structure. No evidence of mitral valve regurgitation.  5. The aortic valve is normal in structure. Aortic valve regurgitation is not visualized. No aortic stenosis is present. FINDINGS  Left Ventricle: Left ventricular ejection fraction, by estimation, is 70 to 75%. The left ventricle has hyperdynamic function. The left ventricle has no regional wall motion abnormalities. The left ventricular internal cavity size was normal in size. There is no left ventricular hypertrophy. Left ventricular diastolic parameters were normal. Right Ventricle: The right ventricular size is normal. Right vetricular wall thickness was not well visualized. Right ventricular systolic function is normal. There is normal pulmonary artery systolic pressure. The tricuspid regurgitant velocity is 2.09 m/s, and with an assumed right atrial pressure of 3 mmHg, the estimated right ventricular systolic pressure is 64.1 mmHg. Left Atrium: Left atrial size was normal in size. Right Atrium: Right atrial size was mildly dilated. Pericardium: There is no evidence of pericardial effusion. Mitral Valve: The mitral valve is normal in structure. No evidence of mitral valve regurgitation. Tricuspid Valve: The tricuspid valve is grossly normal. Tricuspid valve regurgitation is not demonstrated. Aortic Valve: The aortic valve  is normal in structure. Aortic valve regurgitation is not visualized. No aortic stenosis is present. Pulmonic Valve: The pulmonic valve was not well visualized. Pulmonic valve regurgitation is not visualized. Aorta: The aortic root and ascending aorta are structurally normal, with no evidence of dilitation. IAS/Shunts: The  atrial septum is grossly normal.  LEFT VENTRICLE PLAX 2D LVIDd:         4.30 cm   Diastology LVIDs:         2.20 cm   LV e' medial:    8.05 cm/s LV PW:         0.90 cm   LV E/e' medial:  7.8 LV IVS:        0.70 cm   LV e' lateral:   11.90 cm/s LVOT diam:     2.00 cm   LV E/e' lateral: 5.2 LV SV:         61 LV SV Index:   38 LVOT Area:     3.14 cm                           3D Volume EF:                          3D EF:        70 %                          LV EDV:       113 ml                          LV ESV:       34 ml                          LV SV:        79 ml RIGHT VENTRICLE RV S prime:     15.00 cm/s TAPSE (M-mode): 2.3 cm LEFT ATRIUM             Index        RIGHT ATRIUM           Index LA diam:        2.70 cm 1.71 cm/m   RA Area:     18.00 cm LA Vol (A2C):   44.3 ml 27.98 ml/m  RA Volume:   48.10 ml  30.38 ml/m LA Vol (A4C):   43.8 ml 27.66 ml/m LA Biplane Vol: 43.5 ml 27.47 ml/m  AORTIC VALVE LVOT Vmax:   86.95 cm/s LVOT Vmean:  56.050 cm/s LVOT VTI:    0.194 m  AORTA Ao Root diam: 3.20 cm Ao Asc diam:  3.20 cm MITRAL VALVE               TRICUSPID VALVE MV Area (PHT): cm         TR Peak grad:   17.5 mmHg MV Decel Time: 250 msec    TR Vmax:        209.00 cm/s MV E velocity: 62.40 cm/s MV A velocity: 93.80 cm/s  SHUNTS MV E/A ratio:  0.67        Systemic VTI:  0.19 m                            Systemic Diam: 2.00 cm  Mertie Moores MD Electronically signed by Mertie Moores MD Signature Date/Time: 04/04/2021/2:27:42 PM    Final    Disposition   Pt is being discharged home today in good condition.  Follow-up Plans & Appointments     Follow-up Information     Warren Lacy, PA-C Follow up.   Specialty: Internal Medicine Why: 04/25/21 at 10:55 with Clotilde Dieter, PA-C (Cardiology PA) Contact information: [address] [place] [address] [place] [address] [phone]                Discharge Instructions     Amb Referral to Cardiac Rehabilitation   Complete by: As directed     Diagnosis:  Coronary Stents PTCA     After initial evaluation and assessments completed: Virtual Based Care may be provided alone or in conjunction with Phase 2 Cardiac Rehab based on patient barriers.: Yes       Discharge Medications   Allergies as of 04/09/2021       Reactions   Codeine Nausea And Vomiting   Percocet [oxycodone-acetaminophen] Nausea And Vomiting        Medication List     STOP taking these medications    omeprazole 20 MG capsule Commonly known as: PRILOSEC Replaced by: pantoprazole 40 MG tablet       TAKE these medications    acetaminophen 500 MG tablet Commonly known as: TYLENOL Take 1,000 mg by mouth every 6 (six) hours as needed for mild pain or moderate pain.   amLODipine 10 MG tablet Commonly known as: NORVASC TAKE 1 TABLET BY MOUTH EVERY DAY   anastrozole 1 MG tablet Commonly known as: ARIMIDEX TAKE 1 TABLET(1 MG) BY MOUTH DAILY   aspirin EC 81 MG tablet Take 81 mg by mouth daily.   atorvastatin 80 MG tablet Commonly known as: LIPITOR Take 1 tablet (80 mg total) by mouth daily.   cholecalciferol 1000 units tablet Commonly known as: VITAMIN D Take 1,000 Units by mouth daily.   clopidogrel 75 MG tablet Commonly known as: PLAVIX Take 1 tablet (75 mg total) by mouth daily with breakfast. Start taking on: April 10, 2021   glimepiride 4 MG tablet Commonly known as: AMARYL TAKE 1 TABLET BY MOUTH EVERY MORNING AND 1/2 TABLET AT BEDTIME   metoprolol succinate 25 MG 24 hr tablet Commonly known as: Toprol XL Take 1/2 tablets (12.5 mg total) by mouth daily.   multivitamin with minerals Tabs tablet Take 1 tablet by mouth daily.   nitroGLYCERIN 0.4 MG SL tablet Commonly known as: Nitrostat Place 1 tablet (0.4 mg total) under the tongue every 5 (five) minutes as needed for chest pain.   olmesartan 40 MG tablet Commonly known as: BENICAR TAKE 1 TABLET(40 MG) BY MOUTH DAILY   OneTouch Ultra test strip Generic drug: glucose  blood USE AS DIRECTED TWICE DAILY   pantoprazole 40 MG tablet Commonly known as: PROTONIX Take 1 tablet (40 mg total) by mouth daily. Start taking on: April 10, 2021 Replaces: omeprazole 20 MG capsule   polyethylene glycol 17 g packet Commonly known as: MIRALAX / GLYCOLAX Take 17 g by mouth daily.   SYSTANE OP Place 1 drop into both eyes 2 (two) times daily.   vitamin C 1000 MG tablet Take 1,000 mg by mouth daily.           Outstanding Labs/Studies   BMET at follow up   Duration of Discharge Encounter   Greater than 30 minutes including physician time.  Signed, Margie Billet, PA-C  04/09/2021, 12:13 PM  Patient seen and examined and agree with Vikki Ports, PA-C as detailed above.  In brief, the patient is a 81 year old female with history of HTN, HLD, recent right breast lumpectomy with XRT, remote history of esophageal ulcer who presented for planned coronary angiography after having CTA with high grade stenosis in prox-mid LAD with +FFR.  Underwent LHC with Dr. Burt Knack with PCI to the mid LAD with excellent angiographic results. Doing well this morning. Groin site stable. Ready to discharge home.   GEN: Elderly female, NAD Neck: No JVD Cardiac: RRR, no murmurs, rubs, or gallops.  Respiratory: Clear to auscultation bilaterally. GI: Soft, nontender, non-distended  MS: No edema; No deformity. Right groin site c/d/i Neuro:  Nonfocal  Psych: Normal affect    Plan:  -Continue ASA and plavix for at least 52month -Continue BB, ARB and home amlodipine -Will have BMET at follow-up  HGwyndolyn Kaufman MD

## 2021-04-09 NOTE — Progress Notes (Signed)
CARDIAC REHAB PHASE I   PRE:  Rate/Rhythm: 100 ST rest, 118 ST up in room    BP: sitting 118/76    SaO2: 97 RA  MODE:  Ambulation: 340 ft   POST:  Rate/Rhythm: 121 ST with occ PVC    BP: sitting 118/62     SaO2: 97 RA  Pt HR elevated this am. Sts she feels SOB with activity. Denied CP. Discussed stent, Plavix importance, restrictions, diet, exercise, NTG, and CRPII. Pt very inquisitive and receptive. Will refer to [place], ACSM 04/09/2021 9:17 AM

## 2021-04-10 MED FILL — Lidocaine HCl Local Preservative Free (PF) Inj 1%: INTRAMUSCULAR | Qty: 30 | Status: AC

## 2021-04-11 ENCOUNTER — Ambulatory Visit (INDEPENDENT_AMBULATORY_CARE_PROVIDER_SITE_OTHER): Payer: Medicare Other | Admitting: Nurse Practitioner

## 2021-04-11 ENCOUNTER — Encounter: Payer: Self-pay | Admitting: Nurse Practitioner

## 2021-04-11 ENCOUNTER — Other Ambulatory Visit: Payer: Self-pay

## 2021-04-11 VITALS — BP 106/60 | HR 67 | Temp 98.0°F | Ht 62.0 in | Wt 126.0 lb

## 2021-04-11 DIAGNOSIS — I119 Hypertensive heart disease without heart failure: Secondary | ICD-10-CM

## 2021-04-11 DIAGNOSIS — E782 Mixed hyperlipidemia: Secondary | ICD-10-CM | POA: Diagnosis not present

## 2021-04-11 DIAGNOSIS — E1169 Type 2 diabetes mellitus with other specified complication: Secondary | ICD-10-CM | POA: Diagnosis not present

## 2021-04-11 DIAGNOSIS — I25119 Atherosclerotic heart disease of native coronary artery with unspecified angina pectoris: Secondary | ICD-10-CM | POA: Diagnosis not present

## 2021-04-11 DIAGNOSIS — I10 Essential (primary) hypertension: Secondary | ICD-10-CM

## 2021-04-11 MED ORDER — EMPAGLIFLOZIN 10 MG PO TABS: 10.0000 mg | ORAL_TABLET | Freq: Every day | ORAL | 3 refills | Status: DC

## 2021-04-11 NOTE — Progress Notes (Signed)
I,Tianna Badgett,acting as a Education administrator for [employer], FNP.,have documented all relevant documentation on the behalf of [name], FNP,as directed by  [name], FNP while in the presence of [name], [place].  This visit occurred during the SARS-CoV-2 public health emergency.  Safety protocols were in place, including screening questions prior to the visit, additional usage of staff PPE, and extensive cleaning of exam room while observing appropriate contact time as indicated for disinfecting solutions.  Subjective:     Patient ID: Elizabeth Mcfarland , female    DOB: 1940-07-05 , 81 y.o.   MRN: [id number]   Chief Complaint  Patient presents with   Diabetes   Hypertension    HPI  Pt presents today for dm & htn F/U. She had  stent placed on 04/08/2021.  She states she feels better not having the fatigue anymore  BP Readings from Last 3 Encounters: 04/11/21 : 106/60 04/09/21 : 108/63 04/05/21 : (!) 151/68    Diabetes She presents for her follow-up diabetic visit. She has type 2 diabetes mellitus. Pertinent negatives for hypoglycemia include no dizziness or headaches. Pertinent negatives for diabetes include no chest pain, no polydipsia, no polyphagia and no polyuria.  Hypertension This is a chronic problem. The current episode started more than 1 year ago. The problem is unchanged. The problem is controlled. Pertinent negatives include no chest pain, headaches or palpitations. There are no associated agents to hypertension. Risk factors for coronary artery disease include post-menopausal state. Past treatments include ACE inhibitors. The current treatment provides significant improvement. There are no compliance problems.     Past Medical History:  Diagnosis Date   Allergy    Anemia    Blood transfusion without reported diagnosis    Breast cancer ([place]) 11/2018   Right breast invasive mammary carcinoma   Cancer ([place])    Cataract    Chronic kidney disease    Diabetes mellitus  without complication (HCC)    Esophageal ulcer with bleeding 05/05/2013   05/05/2013 EGD linear distal esophageal ulcer - cause of hematemesis prior to EGD    GERD (gastroesophageal reflux disease)    History of kidney stones    Hyperlipidemia    Hypertension    Kidney cysts    Personal history of radiation therapy    Sarcoidosis 1970     Family History  Problem Relation Age of Onset   Early death Mother    Stroke Father    Diabetes Sister    Kidney failure Sister    Colon polyps Sister    Stroke Brother    Colon polyps Brother    Cancer Brother 15       prostate cancer   Heart disease Brother    Emphysema Brother    Cancer Brother 30       prostate cancer   Asthma Brother    CAD Maternal Grandmother    Breast cancer Neg Hx    Colon cancer Neg Hx    Esophageal cancer Neg Hx    Rectal cancer Neg Hx    Stomach cancer Neg Hx      Current Outpatient Medications:    empagliflozin (JARDIANCE) 10 MG TABS tablet, Take 1 tablet (10 mg total) by mouth daily before breakfast., Disp: 30 tablet, Rfl: 3   acetaminophen (TYLENOL) 500 MG tablet, Take 1,000 mg by mouth every 6 (six) hours as needed for mild pain or moderate pain., Disp: , Rfl:    amLODipine (NORVASC) 10 MG tablet, TAKE 1 TABLET  BY MOUTH EVERY DAY, Disp: 90 tablet, Rfl: 1   anastrozole (ARIMIDEX) 1 MG tablet, TAKE 1 TABLET(1 MG) BY MOUTH DAILY, Disp: 30 tablet, Rfl: 3   Ascorbic Acid (VITAMIN C) 1000 MG tablet, Take 1,000 mg by mouth daily., Disp: , Rfl:    aspirin EC 81 MG tablet, Take 81 mg by mouth daily., Disp: , Rfl:    atorvastatin (LIPITOR) 80 MG tablet, Take 1 tablet (80 mg total) by mouth daily., Disp: 90 tablet, Rfl: 3   cholecalciferol (VITAMIN D) 1000 UNITS tablet, Take 1,000 Units by mouth daily. , Disp: , Rfl:    clopidogrel (PLAVIX) 75 MG tablet, Take 1 tablet (75 mg total) by mouth daily with breakfast., Disp: 90 tablet, Rfl: 3   FLUZONE HIGH-DOSE QUADRIVALENT 0.7 ML SUSY, , Disp: , Rfl:    glimepiride  (AMARYL) 4 MG tablet, TAKE 1 TABLET BY MOUTH EVERY MORNING AND 1/2 TABLET AT BEDTIME, Disp: 135 tablet, Rfl: 1   metoprolol succinate (TOPROL XL) 25 MG 24 hr tablet, Take 1/2 tablet (12.5 mg total) by mouth daily., Disp: 90 tablet, Rfl: 3   Multiple Vitamin (MULTIVITAMIN WITH MINERALS) TABS tablet, Take 1 tablet by mouth daily. , Disp: , Rfl:    nitroGLYCERIN (NITROSTAT) 0.4 MG SL tablet, Place 1 tablet (0.4 mg total) under the tongue every 5 (five) minutes as needed for chest pain., Disp: 25 tablet, Rfl: 1   olmesartan (BENICAR) 40 MG tablet, TAKE 1 TABLET(40 MG) BY MOUTH DAILY, Disp: 90 tablet, Rfl: 1   ONETOUCH ULTRA test strip, USE AS DIRECTED TWICE DAILY, Disp: 100 strip, Rfl: 3   pantoprazole (PROTONIX) 40 MG tablet, Take 1 tablet (40 mg total) by mouth daily., Disp: 90 tablet, Rfl: 3   PFIZER COVID-19 VAC BIVALENT injection, , Disp: , Rfl:    Polyethyl Glycol-Propyl Glycol (SYSTANE OP), Place 1 drop into both eyes 2 (two) times daily., Disp: , Rfl:    polyethylene glycol (MIRALAX / GLYCOLAX) packet, Take 17 g by mouth daily. , Disp: , Rfl:    Allergies  Allergen Reactions   Codeine Nausea And Vomiting   Percocet [Oxycodone-Acetaminophen] Nausea And Vomiting     Review of Systems  Constitutional: Negative.   Respiratory: Negative.    Cardiovascular: Negative.  Negative for chest pain and palpitations.  Gastrointestinal: Negative.   Endocrine: Negative for polydipsia, polyphagia and polyuria.  Neurological: Negative.  Negative for dizziness and headaches.    Today's Vitals   04/11/21 1141  BP: 106/60  Pulse: 67  Temp: 98 F (36.7 C)  TempSrc: Oral  Weight: 126 lb (57.2 kg)  Height: 5\' 2"  (1.575 m)   Body mass index is 23.05 kg/m.   Objective:  Physical Exam Vitals reviewed.  Constitutional:      General: She is not in acute distress.    Appearance: Normal appearance. She is well-developed. She is obese.  HENT:     Right Ear: Hearing normal.     Left Ear: Hearing  normal.  Eyes:     General: Lids are normal.     Funduscopic exam:    Right eye: No papilledema.        Left eye: No papilledema.  Neck:     Thyroid: No thyroid mass.     Vascular: No carotid bruit.  Cardiovascular:     Rate and Rhythm: Normal rate and regular rhythm.     Pulses: Normal pulses.     Heart sounds: Normal heart sounds. No murmur heard. Pulmonary:  Effort: Pulmonary effort is normal. No respiratory distress.     Breath sounds: Normal breath sounds. No wheezing.  Chest:     Chest wall: No mass.  Breasts:    Tanner Score is 5.     Right: Normal. No mass or tenderness.     Left: Normal. No mass or tenderness.  Genitourinary:    Rectum: Guaiac result negative.  Musculoskeletal:     Cervical back: Full passive range of motion without pain, normal range of motion and neck supple.  Lymphadenopathy:     Upper Body:     Right upper body: No supraclavicular, axillary or pectoral adenopathy.     Left upper body: No supraclavicular, axillary or pectoral adenopathy.  Skin:    General: Skin is warm and dry.     Capillary Refill: Capillary refill takes less than 2 seconds.  Neurological:     General: No focal deficit present.     Mental Status: She is alert and oriented to person, place, and time.     Cranial Nerves: No cranial nerve deficit.     Sensory: No sensory deficit.     Motor: No weakness.  Psychiatric:        Mood and Affect: Mood normal.        Behavior: Behavior normal.        Thought Content: Thought content normal.        Judgment: Judgment normal.        Assessment And Plan:     1. Essential hypertension Comments: Blood pressure is well controlled, continue current medications  2. Type 2 diabetes mellitus with other specified complication, without long-term current use of insulin (HCC) Comments: HgbA1c is stable, continue Jardiance - empagliflozin (JARDIANCE) 10 MG TABS tablet; Take 1 tablet (10 mg total) by mouth daily before breakfast.   Dispense: 30 tablet; Refill: 3  3. 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 Patient verbalized understanding of the plan and was able to repeat key elements of the plan. All questions were answered to their satisfaction.  [name], FNP   I, [name], FNP, have reviewed all documentation for this visit. The documentation on 04/11/21 for the exam, diagnosis, procedures, and orders are all accurate and complete.   IF YOU HAVE BEEN REFERRED TO A SPECIALIST, IT MAY TAKE 1-2 WEEKS TO SCHEDULE/PROCESS THE REFERRAL. IF YOU HAVE NOT HEARD FROM US/SPECIALIST IN TWO WEEKS, PLEASE GIVE [place] A CALL AT [phone].   THE PATIENT IS ENCOURAGED TO PRACTICE SOCIAL DISTANCING DUE TO THE COVID-19 PANDEMIC.

## 2021-04-11 NOTE — Patient Instructions (Signed)

## 2021-04-15 ENCOUNTER — Other Ambulatory Visit (HOSPITAL_COMMUNITY): Payer: Self-pay

## 2021-04-15 ENCOUNTER — Other Ambulatory Visit (HOSPITAL_BASED_OUTPATIENT_CLINIC_OR_DEPARTMENT_OTHER): Payer: Self-pay

## 2021-04-16 ENCOUNTER — Telehealth (HOSPITAL_COMMUNITY): Payer: Self-pay

## 2021-04-16 NOTE — Telephone Encounter (Signed)
Pt insurance is active and benefits verified through [facility] Medicare Co-pay 0, DED 0/0 met, out of pocket $4,500/$73.84 met, co-insurance 0%. no pre-authorization required. Passport, 04/16/2021'@10' :46am, REF# [id number]  Will contact patient to see if she is interested in the Cardiac Rehab Program. If interested, patient will need to complete follow up appt. Once completed, patient will be contacted for scheduling upon review by the RN Navigator.

## 2021-04-17 ENCOUNTER — Encounter: Payer: Self-pay | Admitting: Nurse Practitioner

## 2021-04-22 ENCOUNTER — Encounter: Payer: Self-pay | Admitting: Nurse Practitioner

## 2021-04-22 ENCOUNTER — Other Ambulatory Visit: Payer: Self-pay

## 2021-04-22 NOTE — Progress Notes (Signed)
Tried returning pt's call to speak with someone in Dr. Ernestina Penna office.  Pt did not indicate in her voicemail purpose of her call; therefore, tried returning pt's call to get further detail.  Pt's voicemail on her preferred telephone number is full and will not allow to leave a voicemail message.

## 2021-04-24 NOTE — Progress Notes (Signed)
Cardiology Office Note:    Date:  04/25/2021   ID:  Elizabeth Mcfarland, DOB [birth year], MRN [id number]  PCP:  [name], [place] Cardiologist: [name], MD   Reason for visit: Hospital follow-up  History of Present Illness:    Elizabeth Mcfarland is a 81 y.o. female with a hx of hypertension, diabetes, recent right breast lumpectomy and radiation therapy in 2020 (no chemotherapy), nephrolithiasis, remote history of distal esophageal ulcer causing hematemesis in 2015.    She was referred to [place] for dyspnea on exertion and fatigue.  She had a CTA of the coronaries showing high-grade stenosis of theLAD.  She received LHC showing 75% stenosis in the mid LAD.  She underwent a successful revascularization with a DES reducing the stenosis to 0%.  Otherwise no significant disease in left main, left circumflex and RCA.  2D echo showed EF 70 to 75%, no significant valve disease.  Today, he feels better.  She states she went for a long walk yesterday and did not feel as tired as she had before.  She denies shortness of breath.  She did have some chest pain yesterday after she was watching Tyree's funeral yesterday.  She noticed elevated blood pressure.  She took 2 sublingual nitro and chest pain provided.  She has had no other chest pain.  She brings in her blood pressure and heart rate log.  Blood pressures 120s to 140s over 70s.  Systolic blood pressure averaging in the 130s.  Heart rate 50s to 60s.  She denies lightheadedness and syncope.  She is interested in doing cardiac rehab.  She mentions that she is trying to increase her weight but has to be careful with her diabetes in her food choices.    Past Medical History:  Diagnosis Date   Allergy    Anemia    Blood transfusion without reported diagnosis    Breast cancer ([place]) 11/2018   Right breast invasive mammary carcinoma   Cancer ([place])    Cataract    Chronic kidney disease    Diabetes mellitus without complication ([place])     Esophageal ulcer with bleeding 05/05/2013   05/05/2013 EGD linear distal esophageal ulcer - cause of hematemesis prior to EGD    GERD (gastroesophageal reflux disease)    History of kidney stones    Hyperlipidemia    Hypertension    Kidney cysts    Personal history of radiation therapy    Sarcoidosis 1970    Past Surgical History:  Procedure Laterality Date   ABDOMINAL HYSTERECTOMY     BREAST EXCISIONAL BIOPSY Left 11/2018   Fibroadenoma   BREAST LUMPECTOMY     BREAST LUMPECTOMY WITH RADIOACTIVE SEED AND SENTINEL LYMPH NODE BIOPSY Bilateral 11/25/2018   Procedure: BILATERAL BREAST LUMPECTOMIES  WITH BILATERAL  RADIOACTIVE SEEDS AND RIGHT SENTINEL LYMPH NODE BIOPSY;  Surgeon: [name], MD;  Location: [facility];  Service: General;  Laterality: Bilateral;   CATARACT EXTRACTION W/ INTRAOCULAR LENS IMPLANT     COLONOSCOPY     COLONOSCOPY, ESOPHAGOGASTRODUODENOSCOPY (EGD) AND ESOPHAGEAL DILATION N/A 05/05/2013   Procedure: COLONOSCOPY, ESOPHAGOGASTRODUODENOSCOPY (EGD) AND ESOPHAGEAL DILATION (ED);  Surgeon: Gatha Mayer, MD;  Location: [facility];  Service: Endoscopy;  Laterality: N/A;   LEFT HEART CATH AND CORONARY ANGIOGRAPHY N/A 04/08/2021   Procedure: LEFT HEART CATH AND CORONARY ANGIOGRAPHY;  Surgeon: Sherren Mocha, MD;  Location: [facility];  Service: Cardiovascular;  Laterality: N/A;   LITHOTRIPSY     POLYPECTOMY  UPPER GASTROINTESTINAL ENDOSCOPY      Current Medications: Current Meds  Medication Sig   acetaminophen (TYLENOL) 500 MG tablet Take 1,000 mg by mouth every 6 (six) hours as needed for mild pain or moderate pain.   amLODipine (NORVASC) 10 MG tablet TAKE 1 TABLET BY MOUTH EVERY DAY   anastrozole (ARIMIDEX) 1 MG tablet TAKE 1 TABLET(1 MG) BY MOUTH DAILY   Ascorbic Acid (VITAMIN C) 1000 MG tablet Take 1,000 mg by mouth daily.   aspirin EC 81 MG tablet Take 81 mg by mouth daily.   atorvastatin (LIPITOR) 80 MG tablet Take 1 tablet (80 mg total) by mouth daily.    cholecalciferol (VITAMIN D) 1000 UNITS tablet Take 1,000 Units by mouth daily.    clopidogrel (PLAVIX) 75 MG tablet Take 1 tablet (75 mg total) by mouth daily with breakfast.   empagliflozin (JARDIANCE) 10 MG TABS tablet Take 1 tablet (10 mg total) by mouth daily before breakfast.   FLUZONE HIGH-DOSE QUADRIVALENT 0.7 ML SUSY    glimepiride (AMARYL) 4 MG tablet TAKE 1 TABLET BY MOUTH EVERY MORNING AND 1/2 TABLET AT BEDTIME   metoprolol succinate (TOPROL XL) 25 MG 24 hr tablet Take 1/2 tablet (12.5 mg total) by mouth daily.   Multiple Vitamin (MULTIVITAMIN WITH MINERALS) TABS tablet Take 1 tablet by mouth daily.    nitroGLYCERIN (NITROSTAT) 0.4 MG SL tablet Place 1 tablet (0.4 mg total) under the tongue every 5 (five) minutes as needed for chest pain.   olmesartan (BENICAR) 40 MG tablet TAKE 1 TABLET(40 MG) BY MOUTH DAILY   ONETOUCH ULTRA test strip USE AS DIRECTED TWICE DAILY   pantoprazole (PROTONIX) 40 MG tablet Take 1 tablet (40 mg total) by mouth daily.   PFIZER COVID-19 VAC BIVALENT injection    Polyethyl Glycol-Propyl Glycol (SYSTANE OP) Place 1 drop into both eyes 2 (two) times daily.   polyethylene glycol (MIRALAX / GLYCOLAX) packet Take 17 g by mouth daily.      Allergies:   Codeine and Percocet [oxycodone-acetaminophen]   Social History   Socioeconomic History   Marital status: Single    Spouse name: Not on file   Number of children: 1   Years of education: Not on file   Highest education level: GED or equivalent  Occupational History   Occupation: Physiological scientist: SELF-EMPLOYED   Occupation: retired  Tobacco Use   Smoking status: Never   Smokeless tobacco: Never  Scientific laboratory technician Use: Never used  Substance and Sexual Activity   Alcohol use: No   Drug use: No   Sexual activity: Not Currently  Other Topics Concern   Not on file  Social History Narrative   Lives alone   Social Determinants of Health   Financial Resource Strain: Low Risk    Difficulty  of Paying Living Expenses: Not hard at all  Food Insecurity: No Food Insecurity   Worried About Charity fundraiser in the Last Year: Never true   Arboriculturist in the Last Year: Never true  Transportation Needs: No Transportation Needs   Lack of Transportation (Medical): No   Lack of Transportation (Non-Medical): No  Physical Activity: Inactive   Days of Exercise per Week: 0 days   Minutes of Exercise per Session: 0 min  Stress: No Stress Concern Present   Feeling of Stress : Not at all  Social Connections: Not on file     Family History: The patient's family history includes Asthma in her  brother; CAD in her maternal grandmother; Cancer (age of onset: 1) in her brother and brother; Colon polyps in her brother and sister; Diabetes in her sister; Early death in her mother; Emphysema in her brother; Heart disease in her brother; Kidney failure in her sister; Stroke in her brother and father. There is no history of Breast cancer, Colon cancer, Esophageal cancer, Rectal cancer, or Stomach cancer.  ROS:   Please see the history of present illness.     EKGs/Labs/Other Studies Reviewed:    Recent Labs: 05/31/2020: TSH 1.150 04/05/2021: ALT 23 04/09/2021: BUN 6; Creatinine, Ser 0.62; Hemoglobin 11.2; Platelets 218; Potassium 3.4; Sodium 139   Recent Lipid Panel Lab Results  Component Value Date/Time   CHOL 199 12/04/2020 12:24 PM   TRIG 29 12/04/2020 12:24 PM   HDL 107 12/04/2020 12:24 PM   LDLCALC 86 12/04/2020 12:24 PM    Physical Exam:    VS:  BP 138/60 (BP Location: Left Arm, Patient Position: Sitting, Cuff Size: Normal)    Pulse 69    Resp 20    Ht 5\' 2"  (1.575 m)    Wt 125 lb (56.7 kg)    SpO2 98%    BMI 22.86 kg/m    No data found.  Wt Readings from Last 3 Encounters:  04/25/21 125 lb (56.7 kg)  04/11/21 126 lb (57.2 kg)  04/08/21 129 lb 11.2 oz (58.8 kg)     GEN:  Well nourished, well developed in no acute distress, thin HEENT: Normal NECK: No JVD; No carotid  bruits CARDIAC: RRR, no murmurs, rubs, gallops RESPIRATORY:  Clear to auscultation without rales, wheezing or rhonchi  ABDOMEN: Soft, non-tender, non-distended MUSCULOSKELETAL: No edema; No deformity  SKIN: Warm and dry NEUROLOGIC:  Alert and oriented PSYCHIATRIC:  Normal affect     ASSESSMENT AND PLAN   Coronary artery disease without angina -Status post DES to the LAD in January 2023.  No other significant coronary disease. -Continue DAPT x1 year.  Continue beta-blocker and statin therapy. -Cleared to start cardiac rehab program.  Hypertension, reasonably controlled -Recommend taking Benicar at night to separate her blood pressure medications and give better 24-hour blood pressure control.  Hyperlipidemia -Lipids in September 2022.  Total cholesterol 199, triglycerides 29, HDL 107, LDL 86. -Continue Lipitor 80 mg daily.    Disposition - Follow-up in 3 months with Dr. Sallyanne Kuster.   Medication Adjustments/Labs and Tests Ordered: Current medicines are reviewed at length with the patient today.  Concerns regarding medicines are outlined above.  No orders of the defined types were placed in this encounter.  No orders of the defined types were placed in this encounter.   Patient Instructions  Medication Instructions:  Recommend Taking Benicar in the Evening. *If you need a refill on your cardiac medications before your next appointment, please call your pharmacy*   Lab Work: No Labs If you have labs (blood work) drawn today and your tests are completely normal, you will receive your results only by: [place] (if you have MyChart) OR A paper copy in the mail If you have any lab test that is abnormal or we need to change your treatment, we will call you to review the results.   Testing/Procedures: No Testing   Follow-Up: At [facility], you and your health needs are our priority.  As part of our continuing mission to provide you with exceptional heart care, we  have created designated Provider Care Teams.  These Care Teams include your primary Cardiologist (physician) and  Advanced Practice Providers (APPs -  Physician Assistants and Nurse Practitioners) who all work together to provide you with the care you need, when you need it.  We recommend signing up for the patient portal called "MyChart".  Sign up information is provided on this After Visit Summary.  MyChart is used to connect with patients for Virtual Visits (Telemedicine).  Patients are able to view lab/test results, encounter notes, upcoming appointments, etc.  Non-urgent messages can be sent to your provider as well.   To learn more about what you can do with MyChart, go to [email or web address].    Your next appointment:   3 month(s)  The format for your next appointment:   In Person  Provider:   [name], MD         Signed, Warren Lacy, PA-C  04/25/2021 11:58 AM    [facility]

## 2021-04-25 ENCOUNTER — Ambulatory Visit: Payer: Medicare Other | Admitting: Physician Assistant

## 2021-04-25 ENCOUNTER — Other Ambulatory Visit: Payer: Self-pay

## 2021-04-25 ENCOUNTER — Encounter: Payer: Self-pay | Admitting: Physician Assistant

## 2021-04-25 VITALS — BP 138/60 | HR 69 | Resp 20 | Ht 62.0 in | Wt 125.0 lb

## 2021-04-25 DIAGNOSIS — I25119 Atherosclerotic heart disease of native coronary artery with unspecified angina pectoris: Secondary | ICD-10-CM

## 2021-04-25 DIAGNOSIS — I10 Essential (primary) hypertension: Secondary | ICD-10-CM | POA: Diagnosis not present

## 2021-04-25 DIAGNOSIS — E7800 Pure hypercholesterolemia, unspecified: Secondary | ICD-10-CM

## 2021-04-25 NOTE — Patient Instructions (Signed)
Medication Instructions:  Recommend Taking Benicar in the Evening. *If you need a refill on your cardiac medications before your next appointment, please call your pharmacy*   Lab Work: No Labs If you have labs (blood work) drawn today and your tests are completely normal, you will receive your results only by: [place] (if you have MyChart) OR A paper copy in the mail If you have any lab test that is abnormal or we need to change your treatment, we will call you to review the results.   Testing/Procedures: No Testing   Follow-Up: At [facility], you and your health needs are our priority.  As part of our continuing mission to provide you with exceptional heart care, we have created designated Provider Care Teams.  These Care Teams include your primary Cardiologist (physician) and Advanced Practice Providers (APPs -  Physician Assistants and Nurse Practitioners) who all work together to provide you with the care you need, when you need it.  We recommend signing up for the patient portal called "MyChart".  Sign up information is provided on this After Visit Summary.  MyChart is used to connect with patients for Virtual Visits (Telemedicine).  Patients are able to view lab/test results, encounter notes, upcoming appointments, etc.  Non-urgent messages can be sent to your provider as well.   To learn more about what you can do with MyChart, go to [email or web address].    Your next appointment:   3 month(s)  The format for your next appointment:   In Person  Provider:   [name], MD

## 2021-05-07 ENCOUNTER — Other Ambulatory Visit (HOSPITAL_COMMUNITY): Payer: Self-pay

## 2021-05-22 ENCOUNTER — Telehealth (HOSPITAL_COMMUNITY): Payer: Self-pay

## 2021-05-22 ENCOUNTER — Encounter (HOSPITAL_COMMUNITY): Payer: Self-pay

## 2021-05-22 NOTE — Telephone Encounter (Signed)
Attempted to call patient in regards to Cardiac Rehab - LM on VM Mailed letter 

## 2021-05-23 ENCOUNTER — Telehealth (HOSPITAL_COMMUNITY): Payer: Self-pay

## 2021-05-31 ENCOUNTER — Ambulatory Visit: Payer: Medicare Other | Admitting: Cardiovascular Disease

## 2021-06-02 ENCOUNTER — Other Ambulatory Visit: Payer: Self-pay | Admitting: Hematology

## 2021-06-02 ENCOUNTER — Other Ambulatory Visit: Payer: Self-pay | Admitting: Nurse Practitioner

## 2021-06-02 DIAGNOSIS — I10 Essential (primary) hypertension: Secondary | ICD-10-CM

## 2021-06-04 DIAGNOSIS — H401131 Primary open-angle glaucoma, bilateral, mild stage: Secondary | ICD-10-CM | POA: Diagnosis not present

## 2021-06-12 ENCOUNTER — Ambulatory Visit (INDEPENDENT_AMBULATORY_CARE_PROVIDER_SITE_OTHER): Payer: Medicare Other

## 2021-06-12 ENCOUNTER — Ambulatory Visit (INDEPENDENT_AMBULATORY_CARE_PROVIDER_SITE_OTHER): Payer: Medicare Other | Admitting: Nurse Practitioner

## 2021-06-12 ENCOUNTER — Encounter: Payer: Self-pay | Admitting: Nurse Practitioner

## 2021-06-12 ENCOUNTER — Other Ambulatory Visit: Payer: Self-pay | Admitting: Nurse Practitioner

## 2021-06-12 ENCOUNTER — Other Ambulatory Visit: Payer: Self-pay

## 2021-06-12 VITALS — BP 124/60 | HR 60 | Temp 98.2°F | Ht 63.4 in | Wt 124.4 lb

## 2021-06-12 VITALS — BP 138/62 | HR 60 | Temp 98.2°F | Ht 63.4 in | Wt 124.0 lb

## 2021-06-12 DIAGNOSIS — Z0000 Encounter for general adult medical examination without abnormal findings: Secondary | ICD-10-CM

## 2021-06-12 DIAGNOSIS — E782 Mixed hyperlipidemia: Secondary | ICD-10-CM | POA: Diagnosis not present

## 2021-06-12 DIAGNOSIS — E1169 Type 2 diabetes mellitus with other specified complication: Secondary | ICD-10-CM | POA: Diagnosis not present

## 2021-06-12 DIAGNOSIS — Z23 Encounter for immunization: Secondary | ICD-10-CM

## 2021-06-12 DIAGNOSIS — N6321 Unspecified lump in the left breast, upper outer quadrant: Secondary | ICD-10-CM

## 2021-06-12 DIAGNOSIS — R3129 Other microscopic hematuria: Secondary | ICD-10-CM | POA: Diagnosis not present

## 2021-06-12 LAB — POCT URINALYSIS DIPSTICK
Bilirubin, UA: NEGATIVE
Glucose, UA: NEGATIVE
Ketones, UA: NEGATIVE
Leukocytes, UA: NEGATIVE
Nitrite, UA: NEGATIVE
Protein, UA: NEGATIVE
Spec Grav, UA: 1.015 (ref 1.010–1.025)
Urobilinogen, UA: 0.2 E.U./dL
pH, UA: 7 (ref 5.0–8.0)

## 2021-06-12 MED ORDER — GLIMEPIRIDE 4 MG PO TABS: ORAL_TABLET | ORAL | 1 refills | Status: DC

## 2021-06-12 NOTE — Progress Notes (Signed)
?Industrial/product designer as a Education administrator for [employer], FNP.,have documented all relevant documentation on the behalf of [name], FNP,as directed by  [name], FNP while in the presence of [name], [place]. ? ?This visit occurred during the SARS-CoV-2 public health emergency.  Safety protocols were in place, including screening questions prior to the visit, additional usage of staff PPE, and extensive cleaning of exam room while observing appropriate contact time as indicated for disinfecting solutions. ? ?Subjective:  ?  ? Patient ID: Elizabeth Mcfarland , female    DOB: Aug 11, 1940 , 81 y.o.   MRN: [id number] ? ? ?Chief Complaint  ?Patient presents with  ? Diabetes  ? ? ?HPI ? ?Patient is here for dm check. At her last visit jardiance was added.  ? ?Diabetes ?She presents for her follow-up diabetic visit. She has type 2 diabetes mellitus. Her disease course has been stable. There are no hypoglycemic associated symptoms. Pertinent negatives for hypoglycemia include no dizziness or headaches. Pertinent negatives for diabetes include no chest pain, no fatigue, no polydipsia, no polyphagia and no polyuria. There are no hypoglycemic complications. Symptoms are stable. There are no diabetic complications. Risk factors for coronary artery disease include hypertension, sedentary lifestyle and dyslipidemia. Current diabetic treatment includes oral agent (dual therapy). She is compliant with treatment all of the time. Her weight is stable. She is following a generally healthy diet. When asked about meal planning, she reported none. She participates in exercise daily (gardening). (Blood sugars was 210 last night, she took her glyburide at bedtime and her blood sugar this morning was 92 after taking a 1/2 tablet) An ACE inhibitor/angiotensin II receptor blocker is being taken. Eye exam is current.  ?Hypertension ?This is a chronic problem. The current episode started more than 1 year ago. The problem has been unchanged.  Pertinent negatives include no chest pain, fatigue or headaches. She has tried ACE inhibitors for the symptoms. The treatment provided significant relief.   ? ?Past Medical History:  ?Diagnosis Date  ? Allergy   ? Anemia   ? Blood transfusion without reported diagnosis   ? Breast cancer ([place]) 11/2018  ? Right breast invasive mammary carcinoma  ? Cancer [facility])   ? Cataract   ? Chronic kidney disease   ? Diabetes mellitus without complication ([place])   ? Esophageal ulcer with bleeding 05/05/2013  ? 05/05/2013 EGD linear distal esophageal ulcer - cause of hematemesis prior to EGD   ? GERD (gastroesophageal reflux disease)   ? History of kidney stones   ? Hyperlipidemia   ? Hypertension   ? Kidney cysts   ? Personal history of radiation therapy   ? Sarcoidosis 1970  ?  ? ?Family History  ?Problem Relation Age of Onset  ? Early death Mother   ? Stroke Father   ? Diabetes Sister   ? Kidney failure Sister   ? Colon polyps Sister   ? Stroke Brother   ? Colon polyps Brother   ? Cancer Brother 37  ?     prostate cancer  ? Heart disease Brother   ? Emphysema Brother   ? Cancer Brother 27  ?     prostate cancer  ? Asthma Brother   ? CAD Maternal Grandmother   ? Breast cancer Neg Hx   ? Colon cancer Neg Hx   ? Esophageal cancer Neg Hx   ? Rectal cancer Neg Hx   ? Stomach cancer Neg Hx   ? ? ? ?Current Outpatient Medications:  ?  acetaminophen (TYLENOL) 500 MG tablet, Take 1,000 mg by mouth every 6 (six) hours as needed for mild pain or moderate pain., Disp: , Rfl:  ?  amLODipine (NORVASC) 10 MG tablet, TAKE 1 TABLET BY MOUTH EVERY DAY (Patient taking differently: Take 10 mg by mouth at bedtime.), Disp: 90 tablet, Rfl: 1 ?  anastrozole (ARIMIDEX) 1 MG tablet, TAKE 1 TABLET(1 MG) BY MOUTH DAILY, Disp: 30 tablet, Rfl: 3 ?  Ascorbic Acid (VITAMIN C) 1000 MG tablet, Take 1,000 mg by mouth daily., Disp: , Rfl:  ?  aspirin EC 81 MG tablet, Take 81 mg by mouth daily., Disp: , Rfl:  ?  atorvastatin (LIPITOR) 80 MG tablet, Take 1 tablet (80 mg  total) by mouth daily., Disp: 90 tablet, Rfl: 3 ?  cholecalciferol (VITAMIN D) 1000 UNITS tablet, Take 1,000 Units by mouth daily. , Disp: , Rfl:  ?  clopidogrel (PLAVIX) 75 MG tablet, Take 1 tablet (75 mg total) by mouth daily with breakfast., Disp: 90 tablet, Rfl: 3 ?  empagliflozin (JARDIANCE) 10 MG TABS tablet, Take 1 tablet (10 mg total) by mouth daily before breakfast., Disp: 30 tablet, Rfl: 3 ?  FLUZONE HIGH-DOSE QUADRIVALENT 0.7 ML SUSY, , Disp: , Rfl:  ?  glimepiride (AMARYL) 4 MG tablet, TAKE 1 TABLET BY MOUTH EVERY MORNING, Disp: 90 tablet, Rfl: 1 ?  metoprolol succinate (TOPROL XL) 25 MG 24 hr tablet, Take 1/2 tablet (12.5 mg total) by mouth daily., Disp: 90 tablet, Rfl: 3 ?  Multiple Vitamin (MULTIVITAMIN WITH MINERALS) TABS tablet, Take 1 tablet by mouth daily. , Disp: , Rfl:  ?  nitroGLYCERIN (NITROSTAT) 0.4 MG SL tablet, Place 1 tablet (0.4 mg total) under the tongue every 5 (five) minutes as needed for chest pain., Disp: 25 tablet, Rfl: 1 ?  olmesartan (BENICAR) 40 MG tablet, TAKE 1 TABLET(40 MG) BY MOUTH DAILY, Disp: 90 tablet, Rfl: 1 ?  ONETOUCH ULTRA test strip, USE AS DIRECTED TWICE DAILY, Disp: 100 strip, Rfl: 3 ?  pantoprazole (PROTONIX) 40 MG tablet, Take 1 tablet (40 mg total) by mouth daily., Disp: 90 tablet, Rfl: 3 ?  PFIZER COVID-19 VAC BIVALENT injection, , Disp: , Rfl:  ?  Polyethyl Glycol-Propyl Glycol (SYSTANE OP), Place 1 drop into both eyes 2 (two) times daily., Disp: , Rfl:  ?  polyethylene glycol (MIRALAX / GLYCOLAX) packet, Take 17 g by mouth daily. , Disp: , Rfl:   ? ?Allergies  ?Allergen Reactions  ? Codeine Nausea And Vomiting  ? Percocet [Oxycodone-Acetaminophen] Nausea And Vomiting  ?  ? ?Review of Systems  ?Constitutional: Negative.  Negative for fatigue.  ?Respiratory: Negative.    ?Cardiovascular: Negative.  Negative for chest pain and palpitations.  ?Gastrointestinal: Negative.   ?Endocrine: Negative for polydipsia, polyphagia and polyuria.  ?Neurological: Negative.   Negative for dizziness and headaches.  ?Psychiatric/Behavioral: Negative.     ? ?Today's Vitals  ? 06/12/21 1023  ?BP: 138/62  ?Pulse: 60  ?Temp: 98.2 ?F (36.8 ?C)  ?TempSrc: Oral  ?Weight: 124 lb (56.2 kg)  ?Height: 5' 3.4" (1.61 m)  ? ?Body mass index is 21.69 kg/m?.  ?Wt Readings from Last 3 Encounters:  ?06/12/21 124 lb (56.2 kg)  ?06/12/21 124 lb 6.4 oz (56.4 kg)  ?04/25/21 125 lb (56.7 kg)  ? ? ?Objective:  ?Physical Exam ?Vitals reviewed.  ?Constitutional:   ?   General: She is not in acute distress. ?   Appearance: Normal appearance. She is well-developed.  ?HENT:  ?   Right Ear:  Hearing normal.  ?   Left Ear: Hearing normal.  ?Eyes:  ?   General: Lids are normal.  ?   Funduscopic exam: ?   Right eye: No papilledema.     ?   Left eye: No papilledema.  ?Neck:  ?   Thyroid: No thyroid mass.  ?   Vascular: No carotid bruit.  ?Cardiovascular:  ?   Rate and Rhythm: Normal rate and regular rhythm.  ?   Pulses: Normal pulses.  ?   Heart sounds: Normal heart sounds. No murmur heard. ?Pulmonary:  ?   Effort: Pulmonary effort is normal. No respiratory distress.  ?   Breath sounds: Normal breath sounds. No wheezing.  ?Chest:  ?   Chest wall: No mass.  ?Breasts: ?   Tanner Score is 5.  ?   Right: Normal. No mass or tenderness.  ?   Left: Normal. No mass or tenderness.  ?Musculoskeletal:  ?   Cervical back: Full passive range of motion without pain, normal range of motion and neck supple.  ?Lymphadenopathy:  ?   Upper Body:  ?   Right upper body: No supraclavicular, axillary or pectoral adenopathy.  ?   Left upper body: No supraclavicular, axillary or pectoral adenopathy.  ?Skin: ?   General: Skin is warm and dry.  ?   Capillary Refill: Capillary refill takes less than 2 seconds.  ?Neurological:  ?   General: No focal deficit present.  ?   Mental Status: She is alert and oriented to person, place, and time.  ?   Cranial Nerves: No cranial nerve deficit.  ?   Sensory: No sensory deficit.  ?   Motor: No weakness.   ?Psychiatric:     ?   Mood and Affect: Mood normal.     ?   Behavior: Behavior normal.     ?   Thought Content: Thought content normal.     ?   Judgment: Judgment normal.  ?  ? ?   ?Assessment And Plan:  ?   ?1. Ty

## 2021-06-12 NOTE — Patient Instructions (Signed)
Ms. Oyer , ?Thank you for taking time to come for your Medicare Wellness Visit. I appreciate your ongoing commitment to your health goals. Please review the following plan we discussed and let me know if I can assist you in the future.  ? ?Screening recommendations/referrals: ?Colonoscopy: not required ?Mammogram: completed 10/10/2020, due 10/11/2021 ?Bone Density: completed 10/10/2020 ?Recommended yearly ophthalmology/optometry visit for glaucoma screening and checkup ?Recommended yearly dental visit for hygiene and checkup ? ?Vaccinations: ?Influenza vaccine: completed 01/15/2021, due next flu season ?Pneumococcal vaccine: completed 09/11/2020 ?Tdap vaccine: today ?Shingles vaccine: discussed   ?Covid-19: 01/01/2021, 07/30/2020, 01/04/2020, 05/21/2019, 05/03/2019 ? ?Advanced directives: .adn ? ?Conditions/risks identified: none ? ?Next appointment: Follow up in one year for your annual wellness visit  ? ? ?Preventive Care 49 Years and Older, Female ?Preventive care refers to lifestyle choices and visits with your health care provider that can promote health and wellness. ?What does preventive care include? ?A yearly physical exam. This is also called an annual well check. ?Dental exams once or twice a year. ?Routine eye exams. Ask your health care provider how often you should have your eyes checked. ?Personal lifestyle choices, including: ?Daily care of your teeth and gums. ?Regular physical activity. ?Eating a healthy diet. ?Avoiding tobacco and drug use. ?Limiting alcohol use. ?Practicing safe sex. ?Taking low-dose aspirin every day. ?Taking vitamin and mineral supplements as recommended by your health care provider. ?What happens during an annual well check? ?The services and screenings done by your health care provider during your annual well check will depend on your age, overall health, lifestyle risk factors, and family history of disease. ?Counseling  ?Your health care provider may ask you questions about  your: ?Alcohol use. ?Tobacco use. ?Drug use. ?Emotional well-being. ?Home and relationship well-being. ?Sexual activity. ?Eating habits. ?History of falls. ?Memory and ability to understand (cognition). ?Work and work Statistician. ?Reproductive health. ?Screening  ?You may have the following tests or measurements: ?Height, weight, and BMI. ?Blood pressure. ?Lipid and cholesterol levels. These may be checked every 5 years, or more frequently if you are over 17 years old. ?Skin check. ?Lung cancer screening. You may have this screening every year starting at age 60 if you have a 30-pack-year history of smoking and currently smoke or have quit within the past 15 years. ?Fecal occult blood test (FOBT) of the stool. You may have this test every year starting at age 17. ?Flexible sigmoidoscopy or colonoscopy. You may have a sigmoidoscopy every 5 years or a colonoscopy every 10 years starting at age 53. ?Hepatitis C blood test. ?Hepatitis B blood test. ?Sexually transmitted disease (STD) testing. ?Diabetes screening. This is done by checking your blood sugar (glucose) after you have not eaten for a while (fasting). You may have this done every 1-3 years. ?Bone density scan. This is done to screen for osteoporosis. You may have this done starting at age 23. ?Mammogram. This may be done every 1-2 years. Talk to your health care provider about how often you should have regular mammograms. ?Talk with your health care provider about your test results, treatment options, and if necessary, the need for more tests. ?Vaccines  ?Your health care provider may recommend certain vaccines, such as: ?Influenza vaccine. This is recommended every year. ?Tetanus, diphtheria, and acellular pertussis (Tdap, Td) vaccine. You may need a Td booster every 10 years. ?Zoster vaccine. You may need this after age 20. ?Pneumococcal 13-valent conjugate (PCV13) vaccine. One dose is recommended after age 29. ?Pneumococcal polysaccharide (PPSV23) vaccine.  One dose  is recommended after age 64. ?Talk to your health care provider about which screenings and vaccines you need and how often you need them. ?This information is not intended to replace advice given to you by your health care provider. Make sure you discuss any questions you have with your health care provider. ?Document Released: 04/06/2015 Document Revised: 11/28/2015 Document Reviewed: 01/09/2015 ?Elsevier Interactive Patient Education ? 2017 [place]. ? ?Fall Prevention in the Home ?Falls can cause injuries. They can happen to people of all ages. There are many things you can do to make your home safe and to help prevent falls. ?What can I do on the outside of my home? ?Regularly fix the edges of walkways and driveways and fix any cracks. ?Remove anything that might make you trip as you walk through a door, such as a raised step or threshold. ?Trim any bushes or trees on the path to your home. ?Use bright outdoor lighting. ?Clear any walking paths of anything that might make someone trip, such as rocks or tools. ?Regularly check to see if handrails are loose or broken. Make sure that both sides of any steps have handrails. ?Any raised decks and porches should have guardrails on the edges. ?Have any leaves, snow, or ice cleared regularly. ?Use sand or salt on walking paths during winter. ?Clean up any spills in your garage right away. This includes oil or grease spills. ?What can I do in the bathroom? ?Use night lights. ?Install grab bars by the toilet and in the tub and shower. Do not use towel bars as grab bars. ?Use non-skid mats or decals in the tub or shower. ?If you need to sit down in the shower, use a plastic, non-slip stool. ?Keep the floor dry. Clean up any water that spills on the floor as soon as it happens. ?Remove soap buildup in the tub or shower regularly. ?Attach bath mats securely with double-sided non-slip rug tape. ?Do not have throw rugs and other things on the floor that can make  you trip. ?What can I do in the bedroom? ?Use night lights. ?Make sure that you have a light by your bed that is easy to reach. ?Do not use any sheets or blankets that are too big for your bed. They should not hang down onto the floor. ?Have a firm chair that has side arms. You can use this for support while you get dressed. ?Do not have throw rugs and other things on the floor that can make you trip. ?What can I do in the kitchen? ?Clean up any spills right away. ?Avoid walking on wet floors. ?Keep items that you use a lot in easy-to-reach places. ?If you need to reach something above you, use a strong step stool that has a grab bar. ?Keep electrical cords out of the way. ?Do not use floor polish or wax that makes floors slippery. If you must use wax, use non-skid floor wax. ?Do not have throw rugs and other things on the floor that can make you trip. ?What can I do with my stairs? ?Do not leave any items on the stairs. ?Make sure that there are handrails on both sides of the stairs and use them. Fix handrails that are broken or loose. Make sure that handrails are as long as the stairways. ?Check any carpeting to make sure that it is firmly attached to the stairs. Fix any carpet that is loose or worn. ?Avoid having throw rugs at the top or bottom of the stairs. If  you do have throw rugs, attach them to the floor with carpet tape. ?Make sure that you have a light switch at the top of the stairs and the bottom of the stairs. If you do not have them, ask someone to add them for you. ?What else can I do to help prevent falls? ?Wear shoes that: ?Do not have high heels. ?Have rubber bottoms. ?Are comfortable and fit you well. ?Are closed at the toe. Do not wear sandals. ?If you use a stepladder: ?Make sure that it is fully opened. Do not climb a closed stepladder. ?Make sure that both sides of the stepladder are locked into place. ?Ask someone to hold it for you, if possible. ?Clearly mark and make sure that you can  see: ?Any grab bars or handrails. ?First and last steps. ?Where the edge of each step is. ?Use tools that help you move around (mobility aids) if they are needed. These include: ?Canes. ?Walkers. ?Scooters. ?Cru

## 2021-06-12 NOTE — Progress Notes (Signed)
?This visit occurred during the SARS-CoV-2 public health emergency.  Safety protocols were in place, including screening questions prior to the visit, additional usage of staff PPE, and extensive cleaning of exam room while observing appropriate contact time as indicated for disinfecting solutions. ? ?Patient received TDAP. It was billed through [employer] with her consent. ? ? ?Subjective:  ? Elizabeth Mcfarland is a 81 y.o. female who presents for Medicare Annual (Subsequent) preventive examination. ? ?Review of Systems    ? ?Cardiac Risk Factors include: advanced age (>70mn, >>39women);diabetes mellitus;hypertension ? ?   ?Objective:  ?  ?Today's Vitals  ? 06/12/21 1014 06/12/21 1050  ?BP: 138/62 124/60  ?Pulse: 60   ?Temp: 98.2 ?F (36.8 ?C)   ?TempSrc: Oral   ?SpO2: 98%   ?Weight: 124 lb 6.4 oz (56.4 kg)   ?Height: 5' 3.4" (1.61 m)   ? ?Body mass index is 21.76 kg/m?. ? ? ?  06/12/2021  ? 10:28 AM 04/08/2021  ?  7:19 AM 08/02/2020  ?  2:08 PM 04/05/2020  ? 10:23 AM 04/02/2020  ?  9:25 AM 08/01/2019  ?  9:23 AM 07/14/2019  ?  9:12 AM  ?Advanced Directives  ?Does Patient Have a Medical Advance Directive? No No No No No No No  ?Would patient like information on creating a medical advance directive? Yes (MAU/Ambulatory/Procedural Areas - Information given) No - Patient declined  No - Patient declined Yes (MAU/Ambulatory/Procedural Areas - Information given)  Yes (MAU/Ambulatory/Procedural Areas - Information given)  ? ? ?Current Medications (verified) ?Outpatient Encounter Medications as of 06/12/2021  ?Medication Sig  ? acetaminophen (TYLENOL) 500 MG tablet Take 1,000 mg by mouth every 6 (six) hours as needed for mild pain or moderate pain.  ? amLODipine (NORVASC) 10 MG tablet TAKE 1 TABLET BY MOUTH EVERY DAY  ? anastrozole (ARIMIDEX) 1 MG tablet TAKE 1 TABLET(1 MG) BY MOUTH DAILY  ? Ascorbic Acid (VITAMIN C) 1000 MG tablet Take 1,000 mg by mouth daily.  ? aspirin EC 81 MG tablet Take 81 mg by mouth daily.  ? atorvastatin  (LIPITOR) 80 MG tablet Take 1 tablet (80 mg total) by mouth daily.  ? cholecalciferol (VITAMIN D) 1000 UNITS tablet Take 1,000 Units by mouth daily.   ? clopidogrel (PLAVIX) 75 MG tablet Take 1 tablet (75 mg total) by mouth daily with breakfast.  ? empagliflozin (JARDIANCE) 10 MG TABS tablet Take 1 tablet (10 mg total) by mouth daily before breakfast.  ? metoprolol succinate (TOPROL XL) 25 MG 24 hr tablet Take 1/2 tablet (12.5 mg total) by mouth daily.  ? Multiple Vitamin (MULTIVITAMIN WITH MINERALS) TABS tablet Take 1 tablet by mouth daily.   ? nitroGLYCERIN (NITROSTAT) 0.4 MG SL tablet Place 1 tablet (0.4 mg total) under the tongue every 5 (five) minutes as needed for chest pain.  ? olmesartan (BENICAR) 40 MG tablet TAKE 1 TABLET(40 MG) BY MOUTH DAILY  ? pantoprazole (PROTONIX) 40 MG tablet Take 1 tablet (40 mg total) by mouth daily.  ? Polyethyl Glycol-Propyl Glycol (SYSTANE OP) Place 1 drop into both eyes 2 (two) times daily.  ? polyethylene glycol (MIRALAX / GLYCOLAX) packet Take 17 g by mouth daily.   ? [DISCONTINUED] glimepiride (AMARYL) 4 MG tablet TAKE 1 TABLET BY MOUTH EVERY MORNING AND 1/2 TABLET AT BEDTIME  ? FLUZONE HIGH-DOSE QUADRIVALENT 0.7 ML SUSY   ? ONETOUCH ULTRA test strip USE AS DIRECTED TWICE DAILY  ? PFIZER COVID-19 VAC BIVALENT injection   ? ?No facility-administered encounter  medications on file as of 06/12/2021.  ? ? ?Allergies (verified) ?Codeine and Percocet [oxycodone-acetaminophen]  ? ?History: ?Past Medical History:  ?Diagnosis Date  ? Allergy   ? Anemia   ? Blood transfusion without reported diagnosis   ? Breast cancer ([place]) 11/2018  ? Right breast invasive mammary carcinoma  ? Cancer [facility])   ? Cataract   ? Chronic kidney disease   ? Diabetes mellitus without complication ([place])   ? Esophageal ulcer with bleeding 05/05/2013  ? 05/05/2013 EGD linear distal esophageal ulcer - cause of hematemesis prior to EGD   ? GERD (gastroesophageal reflux disease)   ? History of kidney stones   ?  Hyperlipidemia   ? Hypertension   ? Kidney cysts   ? Personal history of radiation therapy   ? Sarcoidosis 1970  ? ?Past Surgical History:  ?Procedure Laterality Date  ? ABDOMINAL HYSTERECTOMY    ? BREAST EXCISIONAL BIOPSY Left 11/2018  ? Fibroadenoma  ? BREAST LUMPECTOMY    ? BREAST LUMPECTOMY WITH RADIOACTIVE SEED AND SENTINEL LYMPH NODE BIOPSY Bilateral 11/25/2018  ? Procedure: BILATERAL BREAST LUMPECTOMIES  WITH BILATERAL  RADIOACTIVE SEEDS AND RIGHT SENTINEL LYMPH NODE BIOPSY;  Surgeon: [name], MD;  Location: [place];  Service: General;  Laterality: Bilateral;  ? CATARACT EXTRACTION W/ INTRAOCULAR LENS IMPLANT    ? COLONOSCOPY    ? COLONOSCOPY, ESOPHAGOGASTRODUODENOSCOPY (EGD) AND ESOPHAGEAL DILATION N/A 05/05/2013  ? Procedure: COLONOSCOPY, ESOPHAGOGASTRODUODENOSCOPY (EGD) AND ESOPHAGEAL DILATION (ED);  Surgeon: Gatha Mayer, MD;  Location: [facility];  Service: Endoscopy;  Laterality: N/A;  ? LEFT HEART CATH AND CORONARY ANGIOGRAPHY N/A 04/08/2021  ? Procedure: LEFT HEART CATH AND CORONARY ANGIOGRAPHY;  Surgeon: Sherren Mocha, MD;  Location: [place] [facility];  Service: Cardiovascular;  Laterality: N/A;  ? LITHOTRIPSY    ? POLYPECTOMY    ? UPPER GASTROINTESTINAL ENDOSCOPY    ? ?Family History  ?Problem Relation Age of Onset  ? Early death Mother   ? Stroke Father   ? Diabetes Sister   ? Kidney failure Sister   ? Colon polyps Sister   ? Stroke Brother   ? Colon polyps Brother   ? Cancer Brother 70  ?     prostate cancer  ? Heart disease Brother   ? Emphysema Brother   ? Cancer Brother 66  ?     prostate cancer  ? Asthma Brother   ? CAD Maternal Grandmother   ? Breast cancer Neg Hx   ? Colon cancer Neg Hx   ? Esophageal cancer Neg Hx   ? Rectal cancer Neg Hx   ? Stomach cancer Neg Hx   ? ?Social History  ? ?Socioeconomic History  ? Marital status: Single  ?  Spouse name: Not on file  ? Number of children: 1  ? Years of education: Not on file  ? Highest education level: GED or equivalent  ?Occupational  History  ? Occupation: Caregiver  ?  Employer: SELF-EMPLOYED  ? Occupation: retired  ?Tobacco Use  ? Smoking status: Never  ? Smokeless tobacco: Never  ?Vaping Use  ? Vaping Use: Never used  ?Substance and Sexual Activity  ? Alcohol use: No  ? Drug use: No  ? Sexual activity: Not Currently  ?Other Topics Concern  ? Not on file  ?Social History Narrative  ? Lives alone  ? ?Social Determinants of Health  ? ?Financial Resource Strain: Low Risk   ? Difficulty of Paying Living Expenses: Not hard at all  ?  Food Insecurity: No Food Insecurity  ? Worried About Charity fundraiser in the Last Year: Never true  ? Ran Out of Food in the Last Year: Never true  ?Transportation Needs: No Transportation Needs  ? Lack of Transportation (Medical): No  ? Lack of Transportation (Non-Medical): No  ?Physical Activity: Insufficiently Active  ? Days of Exercise per Week: 1 day  ? Minutes of Exercise per Session: 60 min  ?Stress: No Stress Concern Present  ? Feeling of Stress : Not at all  ?Social Connections: Not on file  ? ? ?Tobacco Counseling ?Counseling given: Not Answered ? ? ?Clinical Intake: ? ?Pre-visit preparation completed: Yes ? ?Pain : No/denies pain ? ?  ? ?Nutritional Status: BMI of 19-24  Normal ?Nutritional Risks: None ?Diabetes: Yes ? ?How often do you need to have someone help you when you read instructions, pamphlets, or other written materials from your doctor or pharmacy?: 1 - Never ? ?Diabetic? Yes ?Nutrition Risk Assessment: ? ?Has the patient had any N/V/D within the last 2 months?  No  ?Does the patient have any non-healing wounds?  No  ?Has the patient had any unintentional weight loss or weight gain?  No  ? ?Diabetes: ? ?Is the patient diabetic?  Yes  ?If diabetic, was a CBG obtained today?  No  ?Did the patient bring in their glucometer from home?  No  ?How often do you monitor your CBG's? daily.  ? ?Financial Strains and Diabetes Management: ? ?Are you having any financial strains with the device, your supplies  or your medication? No .  ?Does the patient want to be seen by Chronic Care Management for management of their diabetes?  No  ?Would the patient like to be referred to a Nutritionist or for Diabetic Managemen

## 2021-06-12 NOTE — Patient Instructions (Signed)

## 2021-06-13 LAB — HEMOGLOBIN A1C
Est. average glucose Bld gHb Est-mCnc: 146 mg/dL
Hgb A1c MFr Bld: 6.7 % — ABNORMAL HIGH (ref 4.8–5.6)

## 2021-06-13 LAB — BMP8+EGFR
BUN/Creatinine Ratio: 14 (ref 12–28)
BUN: 12 mg/dL (ref 8–27)
CO2: 24 mmol/L (ref 20–29)
Calcium: 9.8 mg/dL (ref 8.7–10.3)
Chloride: 105 mmol/L (ref 96–106)
Creatinine, Ser: 0.88 mg/dL (ref 0.57–1.00)
Glucose: 136 mg/dL — ABNORMAL HIGH (ref 70–99)
Potassium: 4.2 mmol/L (ref 3.5–5.2)
Sodium: 141 mmol/L (ref 134–144)
eGFR: 67 mL/min/{1.73_m2} (ref >59)

## 2021-06-13 LAB — LIPID PANEL
Chol/HDL Ratio: 1.9 ratio (ref 0.0–4.4)
Cholesterol, Total: 163 mg/dL (ref 100–199)
HDL: 87 mg/dL (ref >39)
LDL Chol Calc (NIH): 66 mg/dL (ref 0–99)
Triglycerides: 45 mg/dL (ref 0–149)
VLDL Cholesterol Cal: 10 mg/dL (ref 5–40)

## 2021-06-14 LAB — URINE CULTURE

## 2021-06-24 ENCOUNTER — Telehealth (HOSPITAL_COMMUNITY): Payer: Self-pay | Admitting: *Deleted

## 2021-06-24 NOTE — Telephone Encounter (Signed)
Completed health history. Barnet Pall, RN,BSN ?06/24/2021 5:15 PM  ?

## 2021-06-24 NOTE — Telephone Encounter (Signed)
Spoke with the patient. Confirmed appointment for tomorrow will call back this afternoon as Ms Elizabeth Mcfarland requested to complete health history.Barnet Pall, RN,BSN ?06/24/2021 10:05 AM  ?

## 2021-06-25 ENCOUNTER — Ambulatory Visit
Admission: RE | Admit: 2021-06-25 | Discharge: 2021-06-25 | Disposition: A | Discharge status: Home / Self Care | Payer: Medicare Other | Source: Ambulatory Visit | Attending: Nurse Practitioner | Admitting: Nurse Practitioner

## 2021-06-25 ENCOUNTER — Other Ambulatory Visit: Payer: Self-pay | Admitting: Nurse Practitioner

## 2021-06-25 ENCOUNTER — Encounter (HOSPITAL_COMMUNITY)
Admission: RE | Admit: 2021-06-25 | Discharge: 2021-06-25 | Disposition: A | Discharge status: Home / Self Care | Payer: Medicare Other | Source: Ambulatory Visit | Attending: Cardiovascular Disease | Admitting: Cardiovascular Disease

## 2021-06-25 ENCOUNTER — Encounter (HOSPITAL_COMMUNITY): Payer: Self-pay

## 2021-06-25 VITALS — BP 144/60 | HR 65 | Ht 63.5 in | Wt 124.8 lb

## 2021-06-25 DIAGNOSIS — E119 Type 2 diabetes mellitus without complications: Secondary | ICD-10-CM | POA: Diagnosis not present

## 2021-06-25 DIAGNOSIS — Z955 Presence of coronary angioplasty implant and graft: Secondary | ICD-10-CM | POA: Insufficient documentation

## 2021-06-25 DIAGNOSIS — N6321 Unspecified lump in the left breast, upper outer quadrant: Secondary | ICD-10-CM

## 2021-06-25 DIAGNOSIS — R922 Inconclusive mammogram: Secondary | ICD-10-CM | POA: Diagnosis not present

## 2021-06-25 DIAGNOSIS — Z48812 Encounter for surgical aftercare following surgery on the circulatory system: Secondary | ICD-10-CM | POA: Diagnosis not present

## 2021-06-25 LAB — GLUCOSE, CAPILLARY: Glucose-Capillary: 200 mg/dL — ABNORMAL HIGH (ref 70–99)

## 2021-06-25 NOTE — Progress Notes (Addendum)
Cardiac Individual Treatment Plan ? ?Patient Details  ?Name: Elizabeth Mcfarland ?MRN: [id number] ?Date of Birth: 05-Sep-1940 ?Referring Provider:   ?Flowsheet Row CARDIAC REHAB PHASE II ORIENTATION from 06/25/2021 in [facility]  ?Referring Provider [name], [name], MD  ? ?  ? ? ?Initial Encounter Date:  ?Flowsheet Row CARDIAC REHAB PHASE II ORIENTATION from 06/25/2021 in [place]  ?Date 06/25/21  ? ?  ? ? 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 ?Patient's Home Medications on Admission: ? ?Current Outpatient Medications:  ?  acetaminophen (TYLENOL) 500 MG tablet, Take 1,000 mg by mouth every 6 (six) hours as needed for mild pain or moderate pain., Disp: , Rfl:  ?  amLODipine (NORVASC) 10 MG tablet, TAKE 1 TABLET BY MOUTH EVERY DAY (Patient taking differently: Take 10 mg by mouth at bedtime.), Disp: 90 tablet, Rfl: 1 ?  anastrozole (ARIMIDEX) 1 MG tablet, TAKE 1 TABLET(1 MG) BY MOUTH DAILY, Disp: 30 tablet, Rfl: 3 ?  Ascorbic Acid (VITAMIN C) 1000 MG tablet, Take 1,000 mg by mouth daily., Disp: , Rfl:  ?  aspirin EC 81 MG tablet, Take 81 mg by mouth daily., Disp: , Rfl:  ?  atorvastatin (LIPITOR) 80 MG tablet, Take 1 tablet (80 mg total) by mouth daily., Disp: 90 tablet, Rfl: 3 ?  cholecalciferol (VITAMIN D) 1000 UNITS tablet, Take 1,000 Units by mouth daily. , Disp: , Rfl:  ?  clopidogrel (PLAVIX) 75 MG tablet, Take 1 tablet (75 mg total) by mouth daily with breakfast., Disp: 90 tablet, Rfl: 3 ?  empagliflozin (JARDIANCE) 10 MG TABS tablet, Take 1 tablet (10 mg total) by mouth daily before breakfast., Disp: 30 tablet, Rfl: 3 ?  glimepiride (AMARYL) 4 MG tablet, TAKE 1 TABLET BY MOUTH EVERY MORNING, Disp: 90 tablet, Rfl: 1 ?  metoprolol succinate (TOPROL XL) 25 MG 24 hr tablet, Take 1/2 tablet (12.5 mg total) by mouth daily., Disp: 90 tablet, Rfl: 3 ?  Multiple Vitamin (MULTIVITAMIN WITH MINERALS) TABS tablet, Take 1 tablet by mouth daily. , Disp: , Rfl:  ?   nitroGLYCERIN (NITROSTAT) 0.4 MG SL tablet, Place 1 tablet (0.4 mg total) under the tongue every 5 (five) minutes as needed for chest pain., Disp: 25 tablet, Rfl: 1 ?  olmesartan (BENICAR) 40 MG tablet, TAKE 1 TABLET(40 MG) BY MOUTH DAILY, Disp: 90 tablet, Rfl: 1 ?  pantoprazole (PROTONIX) 40 MG tablet, Take 1 tablet (40 mg total) by mouth daily., Disp: 90 tablet, Rfl: 3 ?  Polyethyl Glycol-Propyl Glycol (SYSTANE OP), Place 1 drop into both eyes 2 (two) times daily., Disp: , Rfl:  ?  polyethylene glycol (MIRALAX / GLYCOLAX) packet, Take 17 g by mouth daily. , Disp: , Rfl:  ?  FLUZONE HIGH-DOSE QUADRIVALENT 0.7 ML SUSY, , Disp: , Rfl:  ?  ONETOUCH ULTRA test strip, USE AS DIRECTED TWICE DAILY, Disp: 100 strip, Rfl: 3 ?  PFIZER COVID-19 VAC BIVALENT injection, , Disp: , Rfl:  ? ?Past Medical History: ?Past Medical History:  ?Diagnosis Date  ? Allergy   ? Anemia   ? Blood transfusion without reported diagnosis   ? Breast cancer ([place]) 11/2018  ? Right breast invasive mammary carcinoma  ? Cancer [facility])   ? Cataract   ? Chronic kidney disease   ? Diabetes mellitus without complication ([place])   ? Esophageal ulcer with bleeding 05/05/2013  ? 05/05/2013 EGD linear distal esophageal ulcer - cause of hematemesis prior to EGD   ? GERD (gastroesophageal  reflux disease)   ? History of kidney stones   ? Hyperlipidemia   ? Hypertension   ? Kidney cysts   ? Personal history of radiation therapy   ? Sarcoidosis 1970  ? ? ?Tobacco Use: ?Social History  ? ?Tobacco Use  ?Smoking Status Never  ?Smokeless Tobacco Never  ? ? ?Labs: ?Review Flowsheet   ? ?  ?  Latest Ref Rng & Units 04/18/2020 09/11/2020 12/04/2020 04/08/2021  ?Labs for [facility]P Cardiac and Pulmonary Rehab  ?Cholestrol 100 - 199 mg/dL  189   199     ?LDL (calc) 0 - 99 mg/dL  84   86     ?HDL-C >39 mg/dL  98   107     ?Trlycerides 0 - 149 mg/dL  31   29     ?Hemoglobin A1c 4.8 - 5.6 % 6.8   6.6   6.2   7.7    ? ?  06/12/2021  ?Labs for ITP Cardiac and Pulmonary Rehab  ?Cholestrol 163     ?LDL (calc) 66    ?HDL-C 87    ?Trlycerides 45    ?Hemoglobin A1c 6.7    ?  ? ? Multiple values from one day are sorted in reverse-chronological order  ?  ?  ? ? ?Capillary Blood Glucose: ?Lab Results  ?Component Value Date  ? GLUCAP 200 (H) 06/25/2021  ? GLUCAP 95 04/09/2021  ? GLUCAP 219 (H) 04/08/2021  ? GLUCAP 211 (H) 04/08/2021  ? GLUCAP 151 (H) 04/08/2021  ? ? ? ?Exercise Target Goals: ?Exercise Program Goal: ?Individual exercise prescription set using results from initial 6 min walk test and THRR while considering  patient?s activity barriers and safety.  ? ?Exercise Prescription Goal: ?Initial exercise prescription builds to 30-45 minutes a day of aerobic activity, 2-3 days per week.  Home exercise guidelines will be given to patient during program as part of exercise prescription that the participant will acknowledge. ? ?Activity Barriers & Risk Stratification: ? Activity Barriers & Cardiac Risk Stratification - 06/25/21 0915   ? ?  ? Activity Barriers & Cardiac Risk Stratification  ? Activity Barriers Arthritis;Back Problems;Assistive Device   ? Cardiac Risk Stratification Moderate   ? ?  ?  ? ?  ? ? ?6 Minute Walk: ? 6 Minute Walk   ? ? [place] Name 06/25/21 0934  ?  ?  ?  ? 6 Minute Walk  ? Phase Initial    ? Distance 1269 feet    ? Walk Time 6 minutes    ? # of Rest Breaks 2    ? MPH 2.4    ? METS 2.64    ? RPE 13    ? Perceived Dyspnea  1    ? VO2 Peak 9.26    ? Symptoms Yes (comment)    ? Comments Patient c/o tiredness, tightness across her back, mild SOB, RPD= 1.    ? Resting HR 65 bpm    ? Resting BP 144/60    ? Resting Oxygen Saturation  99 %    ? Exercise Oxygen Saturation  during 6 min walk 99 %    ? Max Ex. HR 98 bpm    ? Max Ex. BP 134/54    ? 2 Minute Post BP 138/60    ? ?  ?  ? ?  ? ? ?Oxygen Initial Assessment: ? ? ?Oxygen Re-Evaluation: ? ? ?Oxygen Discharge (Final Oxygen Re-Evaluation): ? ? ?Initial Exercise Prescription: ? Initial Exercise Prescription - 06/25/21  1000   ? ?  ? Date of  Initial Exercise RX and Referring Provider  ? Date 06/25/21   ? Referring Provider [name], MD   ? Expected Discharge Date 08/23/21   ?  ? Bike  ? Level 1.5   ? Minutes 15   ? METs 2.2   ?  ? NuStep  ? Level 2   ? SPM 85   ? Minutes 15   ? METs 2.2   ?  ? Prescription Details  ? Frequency (times per week) 3   ? Duration Progress to 30 minutes of continuous aerobic without signs/symptoms of physical distress   ?  ? Intensity  ? THRR 40-80% of Max Heartrate 57-114   ? Ratings of Perceived Exertion 11-13   ? Perceived Dyspnea 0-4   ?  ? Progression  ? Progression Continue to progress workloads to maintain intensity without signs/symptoms of physical distress.   ?  ? Resistance Training  ? Training Prescription Yes   ? Weight 2 lbs.   ? Reps 10-15   ? ?  ?  ? ?  ? ? ?Perform Capillary Blood Glucose checks as needed. ? ?Exercise Prescription Changes: ? ? ?Exercise Comments: ? ? ?Exercise Goals and Review: ? ? Exercise Goals   ? ? [place] Name 06/25/21 [phone]  ?  ?  ?  ?  ?  ? Exercise Goals  ? Increase Physical Activity Yes      ? Intervention Provide advice, education, support and counseling about physical activity/exercise needs.;Develop an individualized exercise prescription for aerobic and resistive training based on initial evaluation findings, risk stratification, comorbidities and participant's personal goals.      ? Expected Outcomes Short Term: Attend rehab on a regular basis to increase amount of physical activity.;Long Term: Exercising regularly at least 3-5 days a week.;Long Term: Add in home exercise to make exercise part of routine and to increase amount of physical activity.      ? Increase Strength and Stamina Yes      ? Intervention Provide advice, education, support and counseling about physical activity/exercise needs.;Develop an individualized exercise prescription for aerobic and resistive training based on initial evaluation findings, risk stratification, comorbidities and participant's personal  goals.      ? Expected Outcomes Short Term: Increase workloads from initial exercise prescription for resistance, speed, and METs.;Short Term: Perform resistance training exercises routinely during rehab and add in res

## 2021-06-25 NOTE — Progress Notes (Signed)
Cardiac Rehab Medication Review  ? ?Does the patient  feel that his/her medications are working for him/her?  yes ? ?Has the patient been experiencing any side effects to the medications prescribed?  no ? ?Does the patient measure his/her own blood pressure or blood glucose at home?  yes  ? ?Does the patient have any problems obtaining medications due to transportation or finances?   no ? ?Understanding of regimen: excellent ?Understanding of indications: excellent ?Potential of compliance: excellent ? ? ? ?Elizabeth Mcfarland Ruben Im RN ?06/25/2021 3:16 PM ? ? ?

## 2021-06-26 ENCOUNTER — Telehealth: Payer: Self-pay | Admitting: *Deleted

## 2021-06-26 NOTE — Chronic Care Management (AMB) (Signed)
?  Care Management  ? ?Note ? ?06/26/2021 ?Name: ICEY TELLO MRN: [id number] DOB: [birth year] ? ?ALEYSIA OLTMANN is a 81 y.o. year old female who is a primary care patient of [name], [place]. I reached out to Angelene Giovanni by phone today offer care coordination services.  ? ?Ms. Morency was given information about care management services today including:  ?Care management services include personalized support from designated clinical staff supervised by her physician, including individualized plan of care and coordination with other care providers ?24/7 contact phone numbers for assistance for urgent and routine care needs. ?The patient may stop care management services at any time by phone call to the office staff. ? ?Patient agreed to services and verbal consent obtained.  ? ?Follow up plan: ?Telephone appointment with care management team member scheduled for:07/16/21 ? ?Laverda Sorenson  ?Care Guide, Embedded Care Coordination ?[facility]  ?Direct Dial: [phone] ? ?

## 2021-07-01 ENCOUNTER — Encounter (HOSPITAL_COMMUNITY)
Admission: RE | Admit: 2021-07-01 | Discharge: 2021-07-01 | Disposition: A | Discharge status: Home / Self Care | Payer: Medicare Other | Source: Ambulatory Visit | Attending: Cardiovascular Disease | Admitting: Cardiovascular Disease

## 2021-07-01 DIAGNOSIS — E119 Type 2 diabetes mellitus without complications: Secondary | ICD-10-CM | POA: Diagnosis not present

## 2021-07-01 DIAGNOSIS — Z955 Presence of coronary angioplasty implant and graft: Secondary | ICD-10-CM

## 2021-07-01 DIAGNOSIS — Z48812 Encounter for surgical aftercare following surgery on the circulatory system: Secondary | ICD-10-CM | POA: Diagnosis not present

## 2021-07-01 LAB — GLUCOSE, CAPILLARY
Glucose-Capillary: 164 mg/dL — ABNORMAL HIGH (ref 70–99)
Glucose-Capillary: 150 mg/dL — ABNORMAL HIGH (ref 70–99)

## 2021-07-01 NOTE — Progress Notes (Signed)
Daily Session Note ? ?Patient Details  ?Name: Elizabeth Mcfarland ?MRN: [id number] ?Date of Birth: Oct 28, 1940 ?Referring Provider:   ?Flowsheet Row CARDIAC REHAB PHASE II ORIENTATION from 06/25/2021 in [facility]  ?Referring Provider [name], [name], MD  ? ?  ? ? ?Encounter Date: 07/01/2021 ? ?Check In: ? Session Check In - 07/01/21 1037   ? ?  ? Check-In  ? Supervising physician immediately available to respond to emergencies [facility] immediately available   ? Physician(s) Dr. Maryland Pink   ? Location [facility]   ? Staff Present Maurice Small, RN, [employer], MS, ACSM-CEP, CCRP, Exercise Physiologist;Olinty Celesta Aver, MS, ACSM CEP, Exercise Physiologist;Antonieta Slaven Venetia Maxon, Therapist, sports, BSN   ? Virtual Visit No   ? Medication changes reported     No   ? Fall or balance concerns reported    No   ? Tobacco Cessation No Change   ? Warm-up and Cool-down Performed as group-led instruction   ? Resistance Training Performed Yes   ? VAD Patient? No   ? PAD/SET Patient? No   ?  ? Pain Assessment  ? Currently in Pain? No/denies   ? Pain Score 0-No pain   ? Multiple Pain Sites No   ? ?  ?  ? ?  ? ? ?Capillary Blood Glucose: ?Results for orders placed or performed during the hospital encounter of 07/01/21 (from the past 24 hour(s))  ?Glucose, capillary     Status: Abnormal  ? Collection Time: 07/01/21 10:29 AM  ?Result Value Ref Range  ? Glucose-Capillary 164 (H) 70 - 99 mg/dL  ?Glucose, capillary     Status: Abnormal  ? Collection Time: 07/01/21 11:22 AM  ?Result Value Ref Range  ? Glucose-Capillary 150 (H) 70 - 99 mg/dL  ? ? ? Exercise Prescription Changes - 07/01/21 1019   ? ?  ? Response to Exercise  ? Blood Pressure (Admit) 142/64   ? Blood Pressure (Exercise) 140/80   ? Blood Pressure (Exit) 122/64   ? Heart Rate (Admit) 65 bpm   ? Heart Rate (Exercise) 89 bpm   ? Heart Rate (Exit) 68 bpm   ? Rating of Perceived Exertion (Exercise) 13   ? Symptoms None   ? Comments Off to a good  start with exercise. Decreased [facility] on bike from 1.5 to 1.0.   ? Duration Continue with 30 min of aerobic exercise without signs/symptoms of physical distress.   ? Intensity THRR unchanged   ?  ? Progression  ? Progression Continue to progress workloads to maintain intensity without signs/symptoms of physical distress.   ? Average METs 2.2   ?  ? Resistance Training  ? Training Prescription Yes   ? Weight 2 lbs.   ? Reps 10-15   ? Time 10 Minutes   ?  ? Interval Training  ? Interval Training No   ?  ? Bike  ? Level 1   ? Minutes 15   ? METs 2   ?  ? NuStep  ? Level 2   ? SPM 85   ? Minutes 15   ? METs 2.4   ? ?  ?  ? ?  ? ? ?Social History  ? ?Tobacco Use  ?Smoking Status Never  ?Smokeless Tobacco Never  ? ? ?Goals Met:  ?Exercise tolerated well ?No report of concerns or symptoms today ?Strength training completed today ? ?Goals Unmet:  ?Not Applicable ? ?Comments: [name] started cardiac rehab today.  Pt tolerated light  exercise without difficulty. VSS, telemetry-Sinus Rhythm, asymptomatic.  Medication list reconciled. Pt denies barriers to medicaiton compliance.  PSYCHOSOCIAL ASSESSMENT:  PHQ-0. Pt exhibits positive coping skills, hopeful outlook with supportive family. No psychosocial needs identified at this time, no psychosocial interventions necessary.    Pt enjoys gardening .   Pt oriented to exercise equipment and routine.    Understanding verbalized. Barnet Pall, RN,BSN ?07/01/2021 4:46 PM  ? ? ?Dr. Fransico Him is Medical Director for Cardiac Rehab at [facility]. ?

## 2021-07-02 NOTE — Progress Notes (Signed)
Cardiac Individual Treatment Plan ? ?Patient Details  ?Name: Elizabeth Mcfarland ?MRN: [id number] ?Date of Birth: [birth year] ?Referring Provider:   ?Flowsheet Row CARDIAC REHAB PHASE II ORIENTATION from 06/25/2021 in [place]  ?Referring Provider [name], [name], MD  ? ?  ? ? ?Initial Encounter Date:  ?Flowsheet Row CARDIAC REHAB PHASE II ORIENTATION from 06/25/2021 in [facility]  ?Date 06/25/21  ? ?  ? ? 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 ?Patient's Home Medications on Admission: ? ?Current Outpatient Medications:  ?  acetaminophen (TYLENOL) 500 MG tablet, Take 1,000 mg by mouth every 6 (six) hours as needed for mild pain or moderate pain., Disp: , Rfl:  ?  amLODipine (NORVASC) 10 MG tablet, TAKE 1 TABLET BY MOUTH EVERY DAY (Patient taking differently: Take 10 mg by mouth at bedtime.), Disp: 90 tablet, Rfl: 1 ?  anastrozole (ARIMIDEX) 1 MG tablet, TAKE 1 TABLET(1 MG) BY MOUTH DAILY, Disp: 30 tablet, Rfl: 3 ?  Ascorbic Acid (VITAMIN C) 1000 MG tablet, Take 1,000 mg by mouth daily., Disp: , Rfl:  ?  aspirin EC 81 MG tablet, Take 81 mg by mouth daily., Disp: , Rfl:  ?  atorvastatin (LIPITOR) 80 MG tablet, Take 1 tablet (80 mg total) by mouth daily., Disp: 90 tablet, Rfl: 3 ?  cholecalciferol (VITAMIN D) 1000 UNITS tablet, Take 1,000 Units by mouth daily. , Disp: , Rfl:  ?  clopidogrel (PLAVIX) 75 MG tablet, Take 1 tablet (75 mg total) by mouth daily with breakfast., Disp: 90 tablet, Rfl: 3 ?  empagliflozin (JARDIANCE) 10 MG TABS tablet, Take 1 tablet (10 mg total) by mouth daily before breakfast., Disp: 30 tablet, Rfl: 3 ?  FLUZONE HIGH-DOSE QUADRIVALENT 0.7 ML SUSY, , Disp: , Rfl:  ?  glimepiride (AMARYL) 4 MG tablet, TAKE 1 TABLET BY MOUTH EVERY MORNING, Disp: 90 tablet, Rfl: 1 ?  metoprolol succinate (TOPROL XL) 25 MG 24 hr tablet, Take 1/2 tablet (12.5 mg total) by mouth daily., Disp: 90 tablet, Rfl: 3 ?  Multiple Vitamin (MULTIVITAMIN WITH MINERALS) TABS  tablet, Take 1 tablet by mouth daily. , Disp: , Rfl:  ?  nitroGLYCERIN (NITROSTAT) 0.4 MG SL tablet, Place 1 tablet (0.4 mg total) under the tongue every 5 (five) minutes as needed for chest pain., Disp: 25 tablet, Rfl: 1 ?  olmesartan (BENICAR) 40 MG tablet, TAKE 1 TABLET(40 MG) BY MOUTH DAILY, Disp: 90 tablet, Rfl: 1 ?  ONETOUCH ULTRA test strip, USE AS DIRECTED TWICE DAILY, Disp: 100 strip, Rfl: 3 ?  pantoprazole (PROTONIX) 40 MG tablet, Take 1 tablet (40 mg total) by mouth daily., Disp: 90 tablet, Rfl: 3 ?  PFIZER COVID-19 VAC BIVALENT injection, , Disp: , Rfl:  ?  Polyethyl Glycol-Propyl Glycol (SYSTANE OP), Place 1 drop into both eyes 2 (two) times daily., Disp: , Rfl:  ?  polyethylene glycol (MIRALAX / GLYCOLAX) packet, Take 17 g by mouth daily. , Disp: , Rfl:  ? ?Past Medical History: ?Past Medical History:  ?Diagnosis Date  ? Allergy   ? Anemia   ? Blood transfusion without reported diagnosis   ? Breast cancer ([place]) 11/2018  ? Right breast invasive mammary carcinoma  ? Cancer [facility])   ? Cataract   ? Chronic kidney disease   ? Diabetes mellitus without complication ([place])   ? Esophageal ulcer with bleeding 05/05/2013  ? 05/05/2013 EGD linear distal esophageal ulcer - cause of hematemesis prior to EGD   ? GERD (gastroesophageal  reflux disease)   ? History of kidney stones   ? Hyperlipidemia   ? Hypertension   ? Kidney cysts   ? Personal history of radiation therapy   ? Sarcoidosis 1970  ? ? ?Tobacco Use: ?Social History  ? ?Tobacco Use  ?Smoking Status Never  ?Smokeless Tobacco Never  ? ? ?Labs: ?Review Flowsheet   ? ?  ?  Latest Ref Rng & Units 04/18/2020 09/11/2020 12/04/2020 04/08/2021  ?Labs for ITP Cardiac and Pulmonary Rehab  ?Cholestrol 100 - 199 mg/dL  189   199     ?LDL (calc) 0 - 99 mg/dL  84   86     ?HDL-C >39 mg/dL  98   107     ?Trlycerides 0 - 149 mg/dL  31   29     ?Hemoglobin A1c 4.8 - 5.6 % 6.8   6.6   6.2   7.7    ? ?  06/12/2021  ?Labs for ITP Cardiac and Pulmonary Rehab  ?Cholestrol 163    ?LDL  (calc) 66    ?HDL-C 87    ?Trlycerides 45    ?Hemoglobin A1c 6.7    ?  ? ? Multiple values from one day are sorted in reverse-chronological order  ?  ?  ? ? ?Capillary Blood Glucose: ?Lab Results  ?Component Value Date  ? GLUCAP 150 (H) 07/01/2021  ? GLUCAP 164 (H) 07/01/2021  ? GLUCAP 200 (H) 06/25/2021  ? GLUCAP 95 04/09/2021  ? GLUCAP 219 (H) 04/08/2021  ? ? ? ?Exercise Target Goals: ?Exercise Program Goal: ?Individual exercise prescription set using results from initial 6 min walk test and THRR while considering  patient?s activity barriers and safety.  ? ?Exercise Prescription Goal: ?Initial exercise prescription builds to 30-45 minutes a day of aerobic activity, 2-3 days per week.  Home exercise guidelines will be given to patient during program as part of exercise prescription that the participant will acknowledge. ? ?Activity Barriers & Risk Stratification: ? Activity Barriers & Cardiac Risk Stratification - 06/25/21 0915   ? ?  ? Activity Barriers & Cardiac Risk Stratification  ? Activity Barriers Arthritis;Back Problems;Assistive Device   ? Cardiac Risk Stratification Moderate   ? ?  ?  ? ?  ? ? ?6 Minute Walk: ? 6 Minute Walk   ? ? [place] Name 06/25/21 0934  ?  ?  ?  ? 6 Minute Walk  ? Phase Initial    ? Distance 1269 feet    ? Walk Time 6 minutes    ? # of Rest Breaks 2    ? MPH 2.4    ? METS 2.64    ? RPE 13    ? Perceived Dyspnea  1    ? VO2 Peak 9.26    ? Symptoms Yes (comment)    ? Comments Patient c/o tiredness, tightness across her back, mild SOB, RPD= 1.    ? Resting HR 65 bpm    ? Resting BP 144/60    ? Resting Oxygen Saturation  99 %    ? Exercise Oxygen Saturation  during 6 min walk 99 %    ? Max Ex. HR 98 bpm    ? Max Ex. BP 134/54    ? 2 Minute Post BP 138/60    ? ?  ?  ? ?  ? ? ?Oxygen Initial Assessment: ? ? ?Oxygen Re-Evaluation: ? ? ?Oxygen Discharge (Final Oxygen Re-Evaluation): ? ? ?Initial Exercise Prescription: ? Initial Exercise Prescription - 06/25/21  1000   ? ?  ? Date of Initial  Exercise RX and Referring Provider  ? Date 06/25/21   ? Referring Provider [name], MD   ? Expected Discharge Date 08/23/21   ?  ? Bike  ? Level 1.5   ? Minutes 15   ? METs 2.2   ?  ? NuStep  ? Level 2   ? SPM 85   ? Minutes 15   ? METs 2.2   ?  ? Prescription Details  ? Frequency (times per week) 3   ? Duration Progress to 30 minutes of continuous aerobic without signs/symptoms of physical distress   ?  ? Intensity  ? THRR 40-80% of Max Heartrate 57-114   ? Ratings of Perceived Exertion 11-13   ? Perceived Dyspnea 0-4   ?  ? Progression  ? Progression Continue to progress workloads to maintain intensity without signs/symptoms of physical distress.   ?  ? Resistance Training  ? Training Prescription Yes   ? Weight 2 lbs.   ? Reps 10-15   ? ?  ?  ? ?  ? ? ?Perform Capillary Blood Glucose checks as needed. ? ?Exercise Prescription Changes: ? ? Exercise Prescription Changes   ? ? [place] Name 07/01/21 1019  ?  ?  ?  ?  ?  ? Response to Exercise  ? Blood Pressure (Admit) 142/64      ? Blood Pressure (Exercise) 140/80      ? Blood Pressure (Exit) 122/64      ? Heart Rate (Admit) 65 bpm      ? Heart Rate (Exercise) 89 bpm      ? Heart Rate (Exit) 68 bpm      ? Rating of Perceived Exertion (Exercise) 13      ? Symptoms None      ? Comments Off to a good start with exercise. Decreased WL on bike from 1.5 to 1.0.      ? Duration Continue with 30 min of aerobic exercise without signs/symptoms of physical distress.      ? Intensity THRR unchanged      ?  ? Progression  ? Progression Continue to progress workloads to maintain intensity without signs/symptoms of physical distress.      ? Average METs 2.2      ?  ? Resistance Training  ? Training Prescription Yes      ? Weight 2 lbs.      ? Reps 10-15      ? Time 10 Minutes      ?  ? Interval Training  ? Interval Training No      ?  ? Bike  ? Level 1      ? Minutes 15      ? METs 2      ?  ? NuStep  ? Level 2      ? SPM 85      ? Minutes 15      ? METs 2.4      ? ?  ?  ? ?   ? ? ?Exercise Comments: ? ? Exercise Comments   ? ? [place] Name 07/01/21 1138  ?  ?  ?  ?  ? Exercise Comments Patient tolerated first session of exercise fair without symptoms. Workload on bike decreased from 1.5

## 2021-07-03 ENCOUNTER — Encounter (HOSPITAL_COMMUNITY)
Admission: RE | Admit: 2021-07-03 | Discharge: 2021-07-03 | Disposition: A | Discharge status: Home / Self Care | Payer: Medicare Other | Source: Ambulatory Visit | Attending: Cardiovascular Disease | Admitting: Cardiovascular Disease

## 2021-07-03 VITALS — Ht 63.5 in | Wt 122.6 lb

## 2021-07-03 DIAGNOSIS — E119 Type 2 diabetes mellitus without complications: Secondary | ICD-10-CM | POA: Diagnosis not present

## 2021-07-03 DIAGNOSIS — Z48812 Encounter for surgical aftercare following surgery on the circulatory system: Secondary | ICD-10-CM | POA: Diagnosis not present

## 2021-07-03 DIAGNOSIS — Z955 Presence of coronary angioplasty implant and graft: Secondary | ICD-10-CM

## 2021-07-03 LAB — GLUCOSE, CAPILLARY
Glucose-Capillary: 179 mg/dL — ABNORMAL HIGH (ref 70–99)
Glucose-Capillary: 167 mg/dL — ABNORMAL HIGH (ref 70–99)

## 2021-07-03 NOTE — Progress Notes (Signed)
Elizabeth Mcfarland 81 y.o. female ?Nutrition Note ?Elizabeth Mcfarland is motivated to make lifestyle changes to aid with cardiac rehab. 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 She lives alone and enjoys gardening. She does her own grocery shopping and cooking. She reports concerns of weight loss over the last ~2 years; her goal weight is 135#.   ? ?Labs: A1c 6.7, GFR 67 ?Protein needs: 74g-86g/day (1.2-1.4g/kg ideal body weight), minimum 49g-61g/day 0.8-1.0g/day ideal body weight)  ? ?24 hour recall:  ?Snack: Boost ?Breakfast: scrambled egg, [place] sausage ?Lunch: baked chicken, roasted potatoes, vegetables, occasional bread ?Dinner: sirloin steak, vegetables ? ?Nutrition Diagnosis ?Unintentional wt loss related to fatigue during food preparation and decreased food intake as evidenced by wt loss of 10% in 12 months ?Excessive carbohydrate intake related to consumption of convenience foods and refined carbohydrates as evidenced by A1c of  6.7 ? ?Nutrition Intervention ?Pt?s individual nutrition plan reviewed with pt. ?Benefits of adopting Heart Healthy diet discussed.  ?Continue client-centered nutrition education by RD, as part of interdisciplinary care. ? ?Monitor/Evaluation: ?Patient reports motivation to make lifestyle changes for adherence to heart healthy diet recommendation, blood sugar control, and weight management. We discussed protein intake/needs and carb consistent/high protein snack ideas to aid with blood sugar control + weight gain/weight maintenance. Handouts/notes given. Patient amicable to RD suggestions and verbalizes understanding. Will follow-up as needed.  ? ?12 minutes spent in review of topics related to a heart healthy diet including sodium intake, blood sugar control, weight management, and fiber intake. ? ?Goal(s) ?Patient to add 2 snacks of ~200-300kcals/day to include lean protein + high quality carbohydrate ?Patient to  prioritize lean protein intake at all meals and snacks.  ?Pt to identify and limit food sources of saturated fat, trans fat, refined carbohydrates and sodium ?Pt able to name foods that affect blood glucose. Continue to limit simple sugars, refined carbohydrates, sugary beverages, etc.  ?Pt to describe the benefit of including lean protein/plant proteins, fruits, vegetables, whole grains, nuts/seeds, and low-fat dairy products in a heart healthy meal plan. ? ?Plan:  ?Will provide client-centered nutrition education as part of interdisciplinary care ?Monitor and evaluate progress toward nutrition goal with team. ? ? ?Elizabeth [place], MS, RDN, LDN ? ?

## 2021-07-05 ENCOUNTER — Encounter (HOSPITAL_COMMUNITY)
Admission: RE | Admit: 2021-07-05 | Discharge: 2021-07-05 | Disposition: A | Discharge status: Home / Self Care | Payer: Medicare Other | Source: Ambulatory Visit | Attending: Cardiovascular Disease | Admitting: Cardiovascular Disease

## 2021-07-05 DIAGNOSIS — E119 Type 2 diabetes mellitus without complications: Secondary | ICD-10-CM | POA: Diagnosis not present

## 2021-07-05 DIAGNOSIS — Z955 Presence of coronary angioplasty implant and graft: Secondary | ICD-10-CM | POA: Diagnosis not present

## 2021-07-05 DIAGNOSIS — Z48812 Encounter for surgical aftercare following surgery on the circulatory system: Secondary | ICD-10-CM | POA: Diagnosis not present

## 2021-07-08 ENCOUNTER — Encounter (HOSPITAL_COMMUNITY)
Admission: RE | Admit: 2021-07-08 | Discharge: 2021-07-08 | Disposition: A | Discharge status: Home / Self Care | Payer: Medicare Other | Source: Ambulatory Visit | Attending: Cardiovascular Disease | Admitting: Cardiovascular Disease

## 2021-07-08 DIAGNOSIS — Z48812 Encounter for surgical aftercare following surgery on the circulatory system: Secondary | ICD-10-CM | POA: Diagnosis not present

## 2021-07-08 DIAGNOSIS — E119 Type 2 diabetes mellitus without complications: Secondary | ICD-10-CM | POA: Diagnosis not present

## 2021-07-08 DIAGNOSIS — Z955 Presence of coronary angioplasty implant and graft: Secondary | ICD-10-CM | POA: Diagnosis not present

## 2021-07-09 ENCOUNTER — Telehealth: Payer: Self-pay

## 2021-07-09 ENCOUNTER — Ambulatory Visit
Admission: RE | Admit: 2021-07-09 | Discharge: 2021-07-09 | Disposition: A | Discharge status: Home / Self Care | Payer: Medicare Other | Source: Ambulatory Visit | Attending: Nurse Practitioner | Admitting: Nurse Practitioner

## 2021-07-09 ENCOUNTER — Other Ambulatory Visit: Payer: Self-pay | Admitting: Nurse Practitioner

## 2021-07-09 DIAGNOSIS — N6321 Unspecified lump in the left breast, upper outer quadrant: Secondary | ICD-10-CM

## 2021-07-09 DIAGNOSIS — N6032 Fibrosclerosis of left breast: Secondary | ICD-10-CM | POA: Diagnosis not present

## 2021-07-09 HISTORY — PX: BREAST BIOPSY: SHX20

## 2021-07-09 NOTE — Chronic Care Management (AMB) (Addendum)
? ? ?Chronic Care Management ?Pharmacy Assistant  ? ?Name: Elizabeth Mcfarland  MRN: [id number] DOB: Jun 21, 1940 ? ?Reason for Encounter: Chart review for CPP visit on 07-16-2021 ?  ?Conditions to be addressed/monitored: ?CAD, HTN, DMII, and GERD ? ?Recent office visits:  ?06-12-2021 [name], [place]. CHANGE glimepiride 4 mg 1 tablet in morning and 1/2 tablet at bedtime TO 1 tablet daily. Glucose= 136. A1C= 6.7. ? ?06-12-2021 Kellie Simmering, LPN. Medicare wellness visit. ? ?04-11-2021 [name], [place]. START Jardiance 10 mg daily before breakfast. ? ?02-06-2021 [name], [place]. Visit for acute back pain. DG Chest 2 View and DG Lumbar Spine Complete performed. ? ?Recent consult visits:  ?07-08-2021 [name], MD (Cardiology). Visit for cardiac rehab. ? ?07-05-2021 [name], [name], MD (Cardiology). Visit for cardiac rehab. ? ?07-03-2021 [name], [name], MD (Cardiology). Visit for cardiac rehab. ? ?07-01-2021 [name], [name], MD (Cardiology). Visit for cardiac rehab. ? ?06-25-2021 [name], [name], MD (Cardiology). Visit for cardiac rehab. ? ?04-25-2021 Warren Lacy, PA-C (Cardiology). No changes. Follow up in 3 months. ? ?04-08-2021 Sherren Mocha, MD (Cardiovascular). LEFT HEART CATH AND CORONARY ANGIOGRAPHY procedure. ? ?04-05-2021 [name], MD (Oncology). Continue anastrozole, left mammogram to be done soon, lab and f/u in 6 months. ? ?04-01-2021 [name], [name], MD (Cardiology). CHANGE Olmesartan 20 mg daily TO 40 mg daily. STOP Januvia. COMPLETED tylenol and metoprolol. ? ?03-08-2021 [name], MD (Cardiology). Glucose= 266. CT Coronary morph W/CTA COR W/SCORE W/CA W/CM and echocardiogram ordered. EKG completed. START metoprolol 25 mg 1 tablet 2 hours before test. ? ?02-20-2021 [name], MD (General surgery). Visit for LT follow up. ? ?02-05-2021 [place], [place], [place] (Optometry). Unable to view encounter. ? ?Hospital visits:  ?None in previous 6 months ? ?Medications: ?Outpatient  Encounter Medications as of 07/09/2021  ?Medication Sig  ? acetaminophen (TYLENOL) 500 MG tablet Take 1,000 mg by mouth every 6 (six) hours as needed for mild pain or moderate pain.  ? amLODipine (NORVASC) 10 MG tablet TAKE 1 TABLET BY MOUTH EVERY DAY (Patient taking differently: Take 10 mg by mouth at bedtime.)  ? anastrozole (ARIMIDEX) 1 MG tablet TAKE 1 TABLET(1 MG) BY MOUTH DAILY  ? Ascorbic Acid (VITAMIN C) 1000 MG tablet Take 1,000 mg by mouth daily.  ? aspirin EC 81 MG tablet Take 81 mg by mouth daily.  ? atorvastatin (LIPITOR) 80 MG tablet Take 1 tablet (80 mg total) by mouth daily.  ? cholecalciferol (VITAMIN D) 1000 UNITS tablet Take 1,000 Units by mouth daily.   ? clopidogrel (PLAVIX) 75 MG tablet Take 1 tablet (75 mg total) by mouth daily with breakfast.  ? empagliflozin (JARDIANCE) 10 MG TABS tablet Take 1 tablet (10 mg total) by mouth daily before breakfast.  ? FLUZONE HIGH-DOSE QUADRIVALENT 0.7 ML SUSY   ? glimepiride (AMARYL) 4 MG tablet TAKE 1 TABLET BY MOUTH EVERY MORNING  ? metoprolol succinate (TOPROL XL) 25 MG 24 hr tablet Take 1/2 tablet (12.5 mg total) by mouth daily.  ? Multiple Vitamin (MULTIVITAMIN WITH MINERALS) TABS tablet Take 1 tablet by mouth daily.   ? nitroGLYCERIN (NITROSTAT) 0.4 MG SL tablet Place 1 tablet (0.4 mg total) under the tongue every 5 (five) minutes as needed for chest pain.  ? olmesartan (BENICAR) 40 MG tablet TAKE 1 TABLET(40 MG) BY MOUTH DAILY  ? ONETOUCH ULTRA test strip USE AS DIRECTED TWICE DAILY  ? pantoprazole (PROTONIX) 40 MG tablet Take 1 tablet (40 mg total) by mouth daily.  ? PFIZER COVID-19 VAC BIVALENT injection   ?  Polyethyl Glycol-Propyl Glycol (SYSTANE OP) Place 1 drop into both eyes 2 (two) times daily.  ? polyethylene glycol (MIRALAX / GLYCOLAX) packet Take 17 g by mouth daily.   ? ?No facility-administered encounter medications on file as of 07/09/2021.  ? ?Lab Results  ?Component Value Date/Time  ? HGBA1C 6.7 (H) 06/12/2021 11:35 AM  ? HGBA1C 7.7 (H)  04/08/2021 06:00 PM  ? MICROALBUR 30 07/14/2019 10:45 AM  ? MICROALBUR 10 07/08/2018 11:09 AM  ?  ? ?BP Readings from Last 3 Encounters:  ?06/25/21 (!) 144/60  ?06/12/21 138/62  ?06/12/21 124/60  ? ?Lab Results  ?Component Value Date/Time  ? HGBA1C 6.7 (H) 06/12/2021 11:35 AM  ? HGBA1C 7.7 (H) 04/08/2021 06:00 PM  ? MICROALBUR 30 07/14/2019 10:45 AM  ? MICROALBUR 10 07/08/2018 11:09 AM  ?  ? ?BP Readings from Last 3 Encounters:  ?06/25/21 (!) 144/60  ?06/12/21 138/62  ?06/12/21 124/60  ? ? ? ? ?Have you seen any other providers since your last visit with PCP? Patient stated no just cardia rehab ? ?What is your top health concern to discuss at your upcoming visit? Patient stated no. ? ?Do you have any problems getting your medications from the pharmacy? No ? Financial barriers? No ?Access barriers? No ? ? ?Angelene Giovanni was reminded to have all medications, supplements and any blood glucose and/or blood pressure readings available for review with [name], Pharm. D, at her telephone visit on 07-16-2021 at 9:45 ? ?Last eye exam / retinopathy screening? Patient stated this year. ?Last diabetic foot exam? Patient stated she doesn't get feet examined. ? ?07-09-2021: 1st attempt left VM ?07-10-2021: 2nd attempt left VM ?07-12-2021: 3rd attempt left VM. Patient called back. ? ?Care Gaps: ?Last annual wellness visit? 06-12-2021 ?Last Colonoscopy? 09-21-2018 ?Last Mammogram? 06-25-2021 ?Last Bone Denisty? 10-10-2020 ? ?Immunizations: ?Shingrix overdue ? ?Star Rating Drugs: ?Atorvastatin 80 mg- Last filled 06-02-2021 90 DS [facility] ?Jardiance 10 mg- Last filled 06-06-2021 30 DS [facility] ?Glimepiride 4 mg- Last filled 06-02-2021 90 DS [facility] ?Olmesartan 40 mg- Last filled 06-19-2021 90 DS [facility] ?  ?Malecca Hicks CMA ?Clinical Pharmacist Assistant ?[phone] ? ?

## 2021-07-10 ENCOUNTER — Encounter (HOSPITAL_COMMUNITY)
Admission: RE | Admit: 2021-07-10 | Discharge: 2021-07-10 | Disposition: A | Discharge status: Home / Self Care | Payer: Medicare Other | Source: Ambulatory Visit | Attending: Cardiovascular Disease | Admitting: Cardiovascular Disease

## 2021-07-10 DIAGNOSIS — Z955 Presence of coronary angioplasty implant and graft: Secondary | ICD-10-CM

## 2021-07-10 DIAGNOSIS — E119 Type 2 diabetes mellitus without complications: Secondary | ICD-10-CM | POA: Diagnosis not present

## 2021-07-10 DIAGNOSIS — Z48812 Encounter for surgical aftercare following surgery on the circulatory system: Secondary | ICD-10-CM | POA: Diagnosis not present

## 2021-07-12 ENCOUNTER — Encounter (HOSPITAL_COMMUNITY)
Admission: RE | Admit: 2021-07-12 | Discharge: 2021-07-12 | Disposition: A | Discharge status: Home / Self Care | Payer: Medicare Other | Source: Ambulatory Visit | Attending: Cardiovascular Disease | Admitting: Cardiovascular Disease

## 2021-07-12 DIAGNOSIS — Z48812 Encounter for surgical aftercare following surgery on the circulatory system: Secondary | ICD-10-CM | POA: Diagnosis not present

## 2021-07-12 DIAGNOSIS — Z955 Presence of coronary angioplasty implant and graft: Secondary | ICD-10-CM

## 2021-07-12 DIAGNOSIS — E119 Type 2 diabetes mellitus without complications: Secondary | ICD-10-CM | POA: Diagnosis not present

## 2021-07-15 ENCOUNTER — Encounter (HOSPITAL_COMMUNITY)
Admission: RE | Admit: 2021-07-15 | Discharge: 2021-07-15 | Disposition: A | Discharge status: Home / Self Care | Payer: Medicare Other | Source: Ambulatory Visit | Attending: Cardiovascular Disease | Admitting: Cardiovascular Disease

## 2021-07-15 DIAGNOSIS — E119 Type 2 diabetes mellitus without complications: Secondary | ICD-10-CM | POA: Diagnosis not present

## 2021-07-15 DIAGNOSIS — Z955 Presence of coronary angioplasty implant and graft: Secondary | ICD-10-CM | POA: Diagnosis not present

## 2021-07-15 DIAGNOSIS — Z48812 Encounter for surgical aftercare following surgery on the circulatory system: Secondary | ICD-10-CM | POA: Diagnosis not present

## 2021-07-16 ENCOUNTER — Ambulatory Visit (INDEPENDENT_AMBULATORY_CARE_PROVIDER_SITE_OTHER): Payer: Medicare Other

## 2021-07-16 DIAGNOSIS — E119 Type 2 diabetes mellitus without complications: Secondary | ICD-10-CM

## 2021-07-16 DIAGNOSIS — I10 Essential (primary) hypertension: Secondary | ICD-10-CM

## 2021-07-16 NOTE — Progress Notes (Signed)
? ?Chronic Care Management ?Pharmacy Note ? ?07/24/2021 ?Name:  Elizabeth Mcfarland MRN:  [id number] DOB:  1941-01-21 ? ?Summary: ?Patient reports that she is doing well.  ? ?Recommendations/Changes made from today's visit: ?Recommend patient receive shingrix vaccine.  ?Recommended patient order a new BP cuff from  ? ?Plan: ?Patient to receive shingrix vaccine during PCP office visit with NP- Laurance Flatten in June ? ? ?Subjective: ?Elizabeth Mcfarland is an 81 y.o. year old female who is a primary patient of [name], [place].  The CCM team was consulted for assistance with disease management and care coordination needs.  She moved from [place] to [place]. 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  ? ?Engaged with patient by telephone for initial visit in response to provider referral for pharmacy case management and/or care coordination services.  ? ?Consent to Services:  ?The patient was given the following information about Chronic Care Management services today, agreed to services, and gave verbal consent: 1. CCM service includes personalized support from designated clinical staff supervised by the primary care provider, including individualized plan of care and coordination with other care providers 2. 24/7 contact phone numbers for assistance for urgent and routine care needs. 3. Service will only be billed when office clinical staff spend 20 minutes or more in a month to coordinate care. 4. Only one practitioner may furnish and bill the service in a calendar month. 5.The patient may stop CCM services at any time (effective at the end of the month) by phone call to the office staff. 6. The patient will be responsible for cost sharing (co-pay) of up to 20% of the service fee (after annual deductible is met). Patient agreed to services and consent obtained. ? ?Patient Care  Team: ?[name], FNP as PCP - General (General Practice) ?[name], [name], MD as PCP - Cardiology (Cardiology) ?[name], RN as Oncology Nurse Navigator ?[name], RN as Oncology Nurse Navigator ?[name], MD as Consulting Physician (Hematology) ?[name], MD as Consulting Physician (General Surgery) ?[name], MD as Consulting Physician (Radiation Oncology) ?Alla Feeling, NP as Nurse Practitioner (Nurse Practitioner) ?Mayford Knife, RPH (Pharmacist) ? ?Recent office visits:  ?06-12-2021 [name], [place]. CHANGE glimepiride 4 mg 1 tablet in morning and 1/2 tablet at bedtime TO 1 tablet daily. Glucose= 136. A1C= 6.7. ?  ?06-12-2021 Kellie Simmering, LPN. Medicare wellness visit. ?  ?04-11-2021 [name], [place]. START Jardiance 10 mg daily before breakfast. ?  ?02-06-2021 [name], [place]. Visit for acute back pain. DG Chest 2 View and DG Lumbar Spine Complete performed. ?  ?Recent consult visits:  ?07-08-2021 [name], MD (Cardiology). Visit for cardiac rehab. ?  ?07-05-2021 [name], [name], MD (Cardiology). Visit for cardiac rehab. ?  ?07-03-2021 [name], [name], MD (Cardiology). Visit for cardiac rehab. ?  ?07-01-2021 [name], [name], MD (Cardiology). Visit for cardiac rehab. ?  ?06-25-2021 [name], [name], MD (Cardiology). Visit for cardiac rehab. ?  ?04-25-2021 Warren Lacy, PA-C (Cardiology). No changes. Follow up in 3 months. ?  ?04-08-2021 Sherren Mocha, MD (Cardiovascular). LEFT HEART CATH AND CORONARY ANGIOGRAPHY procedure. ?  ?04-05-2021 [name], MD (Oncology). Continue anastrozole, left mammogram to be done soon, lab and f/u in 6 months. ?  ?04-01-2021 [name], [name], MD (Cardiology). CHANGE Olmesartan 20 mg daily TO 40 mg daily. STOP Januvia.  COMPLETED tylenol and metoprolol. ?  ?03-08-2021 [name], MD (Cardiology). Glucose= 266. CT Coronary morph W/CTA COR W/SCORE W/CA W/CM and echocardiogram ordered. EKG completed. START metoprolol  25 mg 1 tablet 2 hours before test. ?  ?02-20-2021 [name], MD (General surgery). Visit for LT follow up. ?  ?02-05-2021 [place], [place], [place] (Optometry). Unable to view encounter. ?  ?Hospital visits:  ?None in previous 6 months ? ? ?Objective: ? ?Lab Results  ?Component Value Date  ? CREATININE 0.88 06/12/2021  ? BUN 12 06/12/2021  ? EGFR 67 06/12/2021  ? GFRNONAA >60 04/09/2021  ? GFRAA 64 12/20/2019  ? NA 141 06/12/2021  ? K 4.2 06/12/2021  ? CALCIUM 9.8 06/12/2021  ? CO2 24 06/12/2021  ? GLUCOSE 136 (H) 06/12/2021  ? ? ?Lab Results  ?Component Value Date/Time  ? HGBA1C 6.7 (H) 06/12/2021 11:35 AM  ? HGBA1C 7.7 (H) 04/08/2021 06:00 PM  ? MICROALBUR 30 07/14/2019 10:45 AM  ? MICROALBUR 10 07/08/2018 11:09 AM  ?  ?Last diabetic Eye exam:  ?Lab Results  ?Component Value Date/Time  ? HMDIABEYEEXA No Retinopathy 02/05/2021 12:00 AM  ?  ?Last diabetic Foot exam: No results found for: HMDIABFOOTEX  ? ?Lab Results  ?Component Value Date  ? CHOL 163 06/12/2021  ? HDL 87 06/12/2021  ? [place] 66 06/12/2021  ? TRIG 45 06/12/2021  ? CHOLHDL 1.9 06/12/2021  ? ? ? ?  Latest Ref Rng & Units 04/05/2021  ? 10:26 AM 12/04/2020  ? 12:24 PM 10/03/2020  ? 11:38 AM  ?Hepatic Function  ?Total Protein 6.5 - 8.1 g/dL 6.7   7.0   7.0    ?Albumin 3.5 - 5.0 g/dL 4.0   4.9   3.8    ?AST 15 - 41 U/L _0 ?ALT 0 - 44 U/L 23   28   34    ?Alk Phosphatase 38 - 126 U/L 70   85   76    ?Total Bilirubin 0.3 - 1.2 mg/dL 0.8   0.4   0.5    ? ? ?Lab Results  ?Component Value Date/Time  ? TSH 1.150 05/31/2020 10:59 AM  ? TSH 0.802 12/20/2019 04:05 PM  ? ? ? ?  Latest Ref Rng & Units 04/09/2021  ?  3:42 AM 04/05/2021  ? 10:26 AM 04/01/2021  ? 11:55 AM  ?CBC  ?WBC 4.0 - 10.5 K/uL 6.3   6.5   6.2    ?Hemoglobin 12.0 - 15.0 g/dL 11.2   11.2   12.5    ?Hematocrit 36.0 - 46.0 % 33.6   34.9   37.8    ?Platelets 150 - 400 K/uL 218   232   299    ? ? ?Lab Results  ?Component Value Date/Time  ? VD25OH 47.8 07/14/2019 12:38 PM  ? VD25OH 41.4  01/13/2018 09:49 AM  ? ? ?Clinical ASCVD: Yes  ?The 10-year ASCVD risk score (Arnett DK, et al., 2019) is: 43.9% ?  Values used to calculate the score: ?    Age: 11 years ?    Sex: Female ?    Is Non-Hispanic African American: Yes ?    Diabetic: Yes ?    Tobacco smoker: No ?    Systolic Blood Pressure: 287 mmHg ?    Is BP treated: Yes ?    HDL Cholesterol: 87 mg/dL ?    Total Cholesterol: 163 mg/dL   ? ? ?  06/25/2021  ?  9:54 AM 06/12/2021  ? 10:30 AM 08/02/2020  ?  2:11 PM  ?Depression screen PHQ 2/9  ?Decreased Interest 0 0 0  ?Down, Depressed, Hopeless 0 0 0  ?PHQ - 2 Score 0 0 0  ?Altered sleeping 0    ?Tired, decreased energy 0    ?Change in appetite 0    ?Feeling bad or failure about yourself  0    ?Trouble concentrating 0    ?Moving slowly or fidgety/restless 0    ?Suicidal thoughts 0    ?PHQ-9 Score 0    ?Difficult doing work/chores Not difficult at all    ?  ? ?Social History  ? ?Tobacco Use  ?Smoking Status Never  ?Smokeless Tobacco Never  ? ?BP Readings from Last 3 Encounters:  ?06/25/21 (!) 144/60  ?06/12/21 138/62  ?06/12/21 124/60  ? ?Pulse Readings from Last 3 Encounters:  ?06/25/21 65  ?06/12/21 60  ?06/12/21 60  ? ?Wt Readings from Last 3 Encounters:  ?07/03/21 122 lb 9.2 oz (55.6 kg)  ?06/25/21 124 lb 12.5 oz (56.6 kg)  ?06/12/21 124 lb (56.2 kg)  ? ?BMI Readings from Last 3 Encounters:  ?07/03/21 21.37 kg/m?  ?06/25/21 21.76 kg/m?  ?06/12/21 21.69 kg/m?  ? ? ?Assessment/Interventions: Review of patient past medical history, allergies, medications, health status, including review of consultants reports, laboratory and other test data, was performed as part of comprehensive evaluation and provision of chronic care management services.  ? ?SDOH:  (Social Determinants of Health) assessments and interventions performed: Yes ? ?SDOH Screenings  ? ?Alcohol Screen: Not on file  ?Depression (PHQ2-9): Low Risk   ? PHQ-2 Score: 0  ?Financial Resource Strain: Low Risk   ? Difficulty of Paying Living Expenses:  Not hard at all  ?Food Insecurity: No Food Insecurity  ? Worried About Charity fundraiser in the Last Year: Never true  ? Ran Out of Food in the Last Year: Never true  ?Housing: Not on file  ?Physical Activity: Insuffic

## 2021-07-17 ENCOUNTER — Encounter (HOSPITAL_COMMUNITY)
Admission: RE | Admit: 2021-07-17 | Discharge: 2021-07-17 | Disposition: A | Discharge status: Home / Self Care | Payer: Medicare Other | Source: Ambulatory Visit | Attending: Cardiovascular Disease | Admitting: Cardiovascular Disease

## 2021-07-17 DIAGNOSIS — Z48812 Encounter for surgical aftercare following surgery on the circulatory system: Secondary | ICD-10-CM | POA: Diagnosis not present

## 2021-07-17 DIAGNOSIS — Z955 Presence of coronary angioplasty implant and graft: Secondary | ICD-10-CM | POA: Diagnosis not present

## 2021-07-17 DIAGNOSIS — E119 Type 2 diabetes mellitus without complications: Secondary | ICD-10-CM | POA: Diagnosis not present

## 2021-07-19 ENCOUNTER — Encounter (HOSPITAL_COMMUNITY)
Admission: RE | Admit: 2021-07-19 | Discharge: 2021-07-19 | Disposition: A | Discharge status: Home / Self Care | Payer: Medicare Other | Source: Ambulatory Visit | Attending: Cardiovascular Disease | Admitting: Cardiovascular Disease

## 2021-07-19 DIAGNOSIS — Z955 Presence of coronary angioplasty implant and graft: Secondary | ICD-10-CM

## 2021-07-19 DIAGNOSIS — Z48812 Encounter for surgical aftercare following surgery on the circulatory system: Secondary | ICD-10-CM | POA: Diagnosis not present

## 2021-07-19 DIAGNOSIS — E119 Type 2 diabetes mellitus without complications: Secondary | ICD-10-CM | POA: Diagnosis not present

## 2021-07-21 DIAGNOSIS — E119 Type 2 diabetes mellitus without complications: Secondary | ICD-10-CM

## 2021-07-21 DIAGNOSIS — I10 Essential (primary) hypertension: Secondary | ICD-10-CM | POA: Diagnosis not present

## 2021-07-22 ENCOUNTER — Encounter (HOSPITAL_COMMUNITY)
Admission: RE | Admit: 2021-07-22 | Discharge: 2021-07-22 | Disposition: A | Discharge status: Home / Self Care | Payer: Medicare Other | Source: Ambulatory Visit | Attending: Cardiovascular Disease | Admitting: Cardiovascular Disease

## 2021-07-22 DIAGNOSIS — Z48812 Encounter for surgical aftercare following surgery on the circulatory system: Secondary | ICD-10-CM | POA: Diagnosis not present

## 2021-07-22 DIAGNOSIS — E119 Type 2 diabetes mellitus without complications: Secondary | ICD-10-CM | POA: Diagnosis not present

## 2021-07-22 DIAGNOSIS — Z955 Presence of coronary angioplasty implant and graft: Secondary | ICD-10-CM | POA: Insufficient documentation

## 2021-07-24 ENCOUNTER — Encounter (HOSPITAL_COMMUNITY)
Admission: RE | Admit: 2021-07-24 | Discharge: 2021-07-24 | Disposition: A | Discharge status: Home / Self Care | Payer: Medicare Other | Source: Ambulatory Visit | Attending: Cardiovascular Disease | Admitting: Cardiovascular Disease

## 2021-07-24 DIAGNOSIS — E119 Type 2 diabetes mellitus without complications: Secondary | ICD-10-CM | POA: Diagnosis not present

## 2021-07-24 DIAGNOSIS — Z48812 Encounter for surgical aftercare following surgery on the circulatory system: Secondary | ICD-10-CM | POA: Diagnosis not present

## 2021-07-24 DIAGNOSIS — Z955 Presence of coronary angioplasty implant and graft: Secondary | ICD-10-CM

## 2021-07-24 NOTE — Patient Instructions (Signed)
Visit Information ?It was great speaking with you today!  Please let me know if you have any questions about our visit. ? ? Goals Addressed   ? ?  ?  ?  ?  ? This Visit's Progress  ?  Track and Manage My Blood Pressure-Hypertension     ?  Timeframe:  Long-Range Goal ?Priority:  High ?Start Date:                             ?Expected End Date:                      ? ?Follow Up Date 01/14/2022 ?  ?- check blood pressure daily ?- choose a place to take my blood pressure (home, clinic or office, retail store) ?- write blood pressure results in a log or diary  ?  ?Why is this important?   ?You won't feel high blood pressure, but it can still hurt your blood vessels.  ?High blood pressure can cause heart or kidney problems. It can also cause a stroke.  ?Making lifestyle changes like losing a little weight or eating less salt will help.  ?Checking your blood pressure at home and at different times of the day can help to control blood pressure.  ?If the doctor prescribes medicine remember to take it the way the doctor ordered.  ?Call the office if you cannot afford the medicine or if there are questions about it.   ?  ?Notes:  ?  ? ?  ? ? ?Patient Care Plan: [place]  ?  ? ?Problem Identified: HTN, DM II   ?  ? ?Long-Range Goal: Disease Management   ?Note:   ?Current Barriers:  ?Unable to independently monitor therapeutic efficacy ? ?Pharmacist Clinical Goal(s):  ?Patient will achieve adherence to monitoring guidelines and medication adherence to achieve therapeutic efficacy through collaboration with PharmD and provider.  ? ?Interventions: ?1:1 collaboration with [name], FNP regarding development and update of comprehensive plan of care as evidenced by provider attestation and co-signature ?Inter-disciplinary care team collaboration (see longitudinal plan of care) ?Comprehensive medication review performed; medication list updated in electronic medical record ? ?Hypertension (BP goal  <130/80) ?-Uncontrolled ?-Current treatment: ?Amlodipine 10 mg tablet once at night Appropriate, Query effective ?Olmesartan 40 mg tablet once per day in the morning Appropriate, Query effective ?Metoprolol Succinate 25 mg tablet - take 1/2 tablet by mouth in the morning Appropriate, Query effective ?-Current home readings: 4/10 - 5pm:157/83, in the morning the readings are usually - 135/75, 134/77  ? -At cardiac rehab her readings are also good sometimes 120/60, 100/70 ?-Current dietary habits: she is doing better with her eating habits. She is eating a lot of chicken, nuts, she sometimes purchases sirloin hamburgers once in a while. She doesn ot eat anything fried only baked.  ?-Current exercise habits: she gets exercise at cardiac rehab doing the bicycle and the elliptical she is doing 15 minutes on each one. She also does exercise before they start and after they finish. She used to go to the senior center around her home. She also has a large garden and she is growing sweet potatoes, turnip greens, onions, peas, and spinach to name a few. She also have over 100 roses.  ?-Denies hypotensive/hypertensive symptoms ?-Congratulated patient on checking BP in the morning.  ? -She keeps the log book and would like to know more about high and low BP  readings.  ?-The nutritionist works with her in cardiac rehab Monday, Wednesday and Friday  ?-We discussed in great detail how to properly check her BP at home, it goes on her arm.  ? -The brand is Omron, she has the current cuff for 4-5 years  ?-Educated on Symptoms of hypotension and importance of maintaining adequate hydration; ?-Counseled to monitor BP at home at the same time each day, between 8-9 am prior to eating or drinking anything, document, and provide log at future appointments ?-Recommended to continue current medication ? ?Diabetes (A1c goal <7%) ?-Controlled ?-Current medications: ?Jardiance 10 mg tablet once per day Appropriate, Effective, Safe,  Accessible ?Glimiperiride 4 mg tablet by mouth every morning Appropriate, Effective, Safe, Accessible ?-Current home glucose readings: she checks her BS readings every morning  ?fasting glucose: 172, 154, ?-Denies hypoglycemic/hyperglycemic symptoms ?-Current meal patterns:  ?breakfast: 2 boiled eggs and toast, oatmeal with blueberries, [place] sausage with eggs and bacon  ?lunch: [place] with peanut butter, or a [place] sandwich   ?dinner: chicken or ground meat, with vegetables, including garden peas, sweet potatoes ?snacks: she is eating a lot of apples and grapes  ?drinks: a lot of water, at least 6 bottles of water per day, coffee without sugar ?-Current exercise: please see hypertension  ?-Educated on Prevention and management of hypoglycemic episodes; ?-Counseled to check feet daily and get yearly eye exams ?-Recommended to continue current medication ? ?Patient Goals/Self-Care Activities ?Patient will:  ?- take medications as prescribed as evidenced by patient report and record review ? ?Follow Up Plan: The patient has been provided with contact information for the care management team and has been advised to call with any health related questions or concerns.  ?  ? ? ? ?Elizabeth Mcfarland was given information about Chronic Care Management services today including:  ?CCM service includes personalized support from designated clinical staff supervised by her physician, including individualized plan of care and coordination with other care providers ?24/7 contact phone numbers for assistance for urgent and routine care needs. ?Standard insurance, coinsurance, copays and deductibles apply for chronic care management only during months in which we provide at least 20 minutes of these services. Most insurances cover these services at 100%, however patients may be responsible for any copay, coinsurance and/or deductible if applicable. This service may help you avoid the need for more expensive face-to-face services. ?Only one  practitioner may furnish and bill the service in a calendar month. ?The patient may stop CCM services at any time (effective at the end of the month) by phone call to the office staff. ? ?Patient agreed to services and verbal consent obtained.  ? ?The patient verbalized understanding of instructions, educational materials, and care plan provided today and agreed to receive a mailed copy of patient instructions, educational materials, and care plan.  ? ?[name], PharmD ?Clinical Pharmacist Practitioner  ?[facility] ?[phone] ?  ?

## 2021-07-26 ENCOUNTER — Encounter (HOSPITAL_COMMUNITY)
Admission: RE | Admit: 2021-07-26 | Discharge: 2021-07-26 | Disposition: A | Discharge status: Home / Self Care | Payer: Medicare Other | Source: Ambulatory Visit | Attending: Cardiovascular Disease | Admitting: Cardiovascular Disease

## 2021-07-26 DIAGNOSIS — Z955 Presence of coronary angioplasty implant and graft: Secondary | ICD-10-CM | POA: Diagnosis not present

## 2021-07-26 DIAGNOSIS — Z48812 Encounter for surgical aftercare following surgery on the circulatory system: Secondary | ICD-10-CM | POA: Diagnosis not present

## 2021-07-26 DIAGNOSIS — E119 Type 2 diabetes mellitus without complications: Secondary | ICD-10-CM | POA: Diagnosis not present

## 2021-07-26 NOTE — Progress Notes (Signed)
Reviewed home exercise guidelines with patient including endpoints, temperature precautions, target heart rate and rate of perceived exertion. Patient is currently riding her bike 15 minutes, 2-3 days/week as her mode of home exercise. Patient previously exercised at the [facility], participating in [place] 60 minutes once/week and plans to resume upon completion of the cardiac rehab program. Patient voices understanding of instructions given. ? ?Sol Passer, MS, ACSM CEP ? ?

## 2021-07-29 ENCOUNTER — Encounter (HOSPITAL_COMMUNITY)
Admission: RE | Admit: 2021-07-29 | Discharge: 2021-07-29 | Disposition: A | Discharge status: Home / Self Care | Payer: Medicare Other | Source: Ambulatory Visit | Attending: Cardiovascular Disease | Admitting: Cardiovascular Disease

## 2021-07-29 DIAGNOSIS — Z48812 Encounter for surgical aftercare following surgery on the circulatory system: Secondary | ICD-10-CM | POA: Diagnosis not present

## 2021-07-29 DIAGNOSIS — Z955 Presence of coronary angioplasty implant and graft: Secondary | ICD-10-CM | POA: Diagnosis not present

## 2021-07-29 DIAGNOSIS — E119 Type 2 diabetes mellitus without complications: Secondary | ICD-10-CM | POA: Diagnosis not present

## 2021-07-30 NOTE — Progress Notes (Signed)
Cardiac Individual Treatment Plan ? ?Patient Details  ?Name: Elizabeth Mcfarland ?MRN: [id number] ?Date of Birth: [birth year] ?Referring Provider:   ?Flowsheet Row CARDIAC REHAB PHASE II ORIENTATION from 06/25/2021 in [place]  ?Referring Provider [name], [name], MD  ? ?  ? ? ?Initial Encounter Date:  ?Flowsheet Row CARDIAC REHAB PHASE II ORIENTATION from 06/25/2021 in [place]  ?Date 06/25/21  ? ?  ? ? 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 ?Patient's Home Medications on Admission: ? ?Current Outpatient Medications:  ?  acetaminophen (TYLENOL) 500 MG tablet, Take 1,000 mg by mouth every 6 (six) hours as needed for mild pain or moderate pain., Disp: , Rfl:  ?  amLODipine (NORVASC) 10 MG tablet, TAKE 1 TABLET BY MOUTH EVERY DAY (Patient taking differently: Take 10 mg by mouth at bedtime.), Disp: 90 tablet, Rfl: 1 ?  anastrozole (ARIMIDEX) 1 MG tablet, TAKE 1 TABLET(1 MG) BY MOUTH DAILY, Disp: 30 tablet, Rfl: 3 ?  Ascorbic Acid (VITAMIN C) 1000 MG tablet, Take 1,000 mg by mouth daily., Disp: , Rfl:  ?  aspirin EC 81 MG tablet, Take 81 mg by mouth daily., Disp: , Rfl:  ?  atorvastatin (LIPITOR) 80 MG tablet, Take 1 tablet (80 mg total) by mouth daily., Disp: 90 tablet, Rfl: 3 ?  cholecalciferol (VITAMIN D) 1000 UNITS tablet, Take 1,000 Units by mouth daily. , Disp: , Rfl:  ?  clopidogrel (PLAVIX) 75 MG tablet, Take 1 tablet (75 mg total) by mouth daily with breakfast., Disp: 90 tablet, Rfl: 3 ?  empagliflozin (JARDIANCE) 10 MG TABS tablet, Take 1 tablet (10 mg total) by mouth daily before breakfast., Disp: 30 tablet, Rfl: 3 ?  FLUZONE HIGH-DOSE QUADRIVALENT 0.7 ML SUSY, , Disp: , Rfl:  ?  glimepiride (AMARYL) 4 MG tablet, TAKE 1 TABLET BY MOUTH EVERY MORNING, Disp: 90 tablet, Rfl: 1 ?  metoprolol succinate (TOPROL XL) 25 MG 24 hr tablet, Take 1/2 tablet (12.5 mg total) by mouth daily., Disp: 90 tablet, Rfl: 3 ?  Multiple Vitamin (MULTIVITAMIN WITH MINERALS) TABS  tablet, Take 1 tablet by mouth daily. , Disp: , Rfl:  ?  nitroGLYCERIN (NITROSTAT) 0.4 MG SL tablet, Place 1 tablet (0.4 mg total) under the tongue every 5 (five) minutes as needed for chest pain., Disp: 25 tablet, Rfl: 1 ?  olmesartan (BENICAR) 40 MG tablet, TAKE 1 TABLET(40 MG) BY MOUTH DAILY, Disp: 90 tablet, Rfl: 1 ?  ONETOUCH ULTRA test strip, USE AS DIRECTED TWICE DAILY, Disp: 100 strip, Rfl: 3 ?  pantoprazole (PROTONIX) 40 MG tablet, Take 1 tablet (40 mg total) by mouth daily., Disp: 90 tablet, Rfl: 3 ?  PFIZER COVID-19 VAC BIVALENT injection, , Disp: , Rfl:  ?  Polyethyl Glycol-Propyl Glycol (SYSTANE OP), Place 1 drop into both eyes 2 (two) times daily., Disp: , Rfl:  ?  polyethylene glycol (MIRALAX / GLYCOLAX) packet, Take 17 g by mouth daily. , Disp: , Rfl:  ? ?Past Medical History: ?Past Medical History:  ?Diagnosis Date  ? Allergy   ? Anemia   ? Blood transfusion without reported diagnosis   ? Breast cancer ([place]) 11/2018  ? Right breast invasive mammary carcinoma  ? Cancer [facility])   ? Cataract   ? Chronic kidney disease   ? Diabetes mellitus without complication ([place])   ? Esophageal ulcer with bleeding 05/05/2013  ? 05/05/2013 EGD linear distal esophageal ulcer - cause of hematemesis prior to EGD   ? GERD (gastroesophageal  reflux disease)   ? History of kidney stones   ? Hyperlipidemia   ? Hypertension   ? Kidney cysts   ? Personal history of radiation therapy   ? Sarcoidosis 1970  ? ? ?Tobacco Use: ?Social History  ? ?Tobacco Use  ?Smoking Status Never  ?Smokeless Tobacco Never  ? ? ?Labs: ?Review Flowsheet   ? ?  ?  Latest Ref Rng & Units 04/18/2020 09/11/2020 12/04/2020 04/08/2021  ?Labs for ITP Cardiac and Pulmonary Rehab  ?Cholestrol 100 - 199 mg/dL  189   199     ?LDL (calc) 0 - 99 mg/dL  84   86     ?HDL-C >39 mg/dL  98   107     ?Trlycerides 0 - 149 mg/dL  31   29     ?Hemoglobin A1c 4.8 - 5.6 % 6.8   6.6   6.2   7.7    ? ?  06/12/2021  ?Labs for ITP Cardiac and Pulmonary Rehab  ?Cholestrol 163    ?LDL  (calc) 66    ?HDL-C 87    ?Trlycerides 45    ?Hemoglobin A1c 6.7    ?  ? ? Multiple values from one day are sorted in reverse-chronological order  ?  ?  ? ? ?Capillary Blood Glucose: ?Lab Results  ?Component Value Date  ? GLUCAP 167 (H) 07/03/2021  ? GLUCAP 179 (H) 07/03/2021  ? GLUCAP 150 (H) 07/01/2021  ? GLUCAP 164 (H) 07/01/2021  ? GLUCAP 200 (H) 06/25/2021  ? ? ? ?Exercise Target Goals: ?Exercise Program Goal: ?Individual exercise prescription set using results from initial 6 min walk test and THRR while considering  patient?s activity barriers and safety.  ? ?Exercise Prescription Goal: ?Initial exercise prescription builds to 30-45 minutes a day of aerobic activity, 2-3 days per week.  Home exercise guidelines will be given to patient during program as part of exercise prescription that the participant will acknowledge. ? ?Activity Barriers & Risk Stratification: ? Activity Barriers & Cardiac Risk Stratification - 06/25/21 0915   ? ?  ? Activity Barriers & Cardiac Risk Stratification  ? Activity Barriers Arthritis;Back Problems;Assistive Device   ? Cardiac Risk Stratification Moderate   ? ?  ?  ? ?  ? ? ?6 Minute Walk: ? 6 Minute Walk   ? ? [place] Name 06/25/21 0934  ?  ?  ?  ? 6 Minute Walk  ? Phase Initial    ? Distance 1269 feet    ? Walk Time 6 minutes    ? # of Rest Breaks 2    ? MPH 2.4    ? METS 2.64    ? RPE 13    ? Perceived Dyspnea  1    ? VO2 Peak 9.26    ? Symptoms Yes (comment)    ? Comments Patient c/o tiredness, tightness across her back, mild SOB, RPD= 1.    ? Resting HR 65 bpm    ? Resting BP 144/60    ? Resting Oxygen Saturation  99 %    ? Exercise Oxygen Saturation  during 6 min walk 99 %    ? Max Ex. HR 98 bpm    ? Max Ex. BP 134/54    ? 2 Minute Post BP 138/60    ? ?  ?  ? ?  ? ? ?Oxygen Initial Assessment: ? ? ?Oxygen Re-Evaluation: ? ? ?Oxygen Discharge (Final Oxygen Re-Evaluation): ? ? ?Initial Exercise Prescription: ? Initial Exercise Prescription -  06/25/21 1000   ? ?  ? Date of Initial  Exercise RX and Referring Provider  ? Date 06/25/21   ? Referring Provider [name], MD   ? Expected Discharge Date 08/23/21   ?  ? Bike  ? Level 1.5   ? Minutes 15   ? METs 2.2   ?  ? NuStep  ? Level 2   ? SPM 85   ? Minutes 15   ? METs 2.2   ?  ? Prescription Details  ? Frequency (times per week) 3   ? Duration Progress to 30 minutes of continuous aerobic without signs/symptoms of physical distress   ?  ? Intensity  ? THRR 40-80% of Max Heartrate 57-114   ? Ratings of Perceived Exertion 11-13   ? Perceived Dyspnea 0-4   ?  ? Progression  ? Progression Continue to progress workloads to maintain intensity without signs/symptoms of physical distress.   ?  ? Resistance Training  ? Training Prescription Yes   ? Weight 2 lbs.   ? Reps 10-15   ? ?  ?  ? ?  ? ? ?Perform Capillary Blood Glucose checks as needed. ? ?Exercise Prescription Changes: ? ? Exercise Prescription Changes   ? ? [place] Name 07/01/21 1019 07/15/21 1021 07/29/21 1019  ?  ?  ?  ? Response to Exercise  ? Blood Pressure (Admit) 142/64 118/70 134/62    ? Blood Pressure (Exercise) 140/80 148/60 128/64    ? Blood Pressure (Exit) 122/64 110/60 102/60    ? Heart Rate (Admit) 65 bpm 63 bpm 55 bpm    ? Heart Rate (Exercise) 89 bpm 100 bpm 87 bpm    ? Heart Rate (Exit) 68 bpm 70 bpm 64 bpm    ? Rating of Perceived Exertion (Exercise) '13 13 12    '$ ? Symptoms None None None    ? Comments Off to a good start with exercise. Decreased WL on bike from 1.5 to 1.0. -- --    ? Duration Continue with 30 min of aerobic exercise without signs/symptoms of physical distress. Continue with 30 min of aerobic exercise without signs/symptoms of physical distress. Continue with 30 min of aerobic exercise without signs/symptoms of physical distress.    ? Intensity THRR unchanged THRR unchanged THRR unchanged    ?  ? Progression  ? Progression Continue to progress workloads to maintain intensity without signs/symptoms of physical distress. Continue to progress workloads to  maintain intensity without signs/symptoms of physical distress. Continue to progress workloads to maintain intensity without signs/symptoms of physical distress.    ? Average METs 2.2 2.6 2.9    ?  ? Resistance T

## 2021-07-31 ENCOUNTER — Encounter (HOSPITAL_COMMUNITY)
Admission: RE | Admit: 2021-07-31 | Discharge: 2021-07-31 | Disposition: A | Discharge status: Home / Self Care | Payer: Medicare Other | Source: Ambulatory Visit | Attending: Cardiovascular Disease | Admitting: Cardiovascular Disease

## 2021-07-31 DIAGNOSIS — Z48812 Encounter for surgical aftercare following surgery on the circulatory system: Secondary | ICD-10-CM | POA: Diagnosis not present

## 2021-07-31 DIAGNOSIS — Z955 Presence of coronary angioplasty implant and graft: Secondary | ICD-10-CM | POA: Diagnosis not present

## 2021-07-31 DIAGNOSIS — E119 Type 2 diabetes mellitus without complications: Secondary | ICD-10-CM | POA: Diagnosis not present

## 2021-08-02 ENCOUNTER — Encounter (HOSPITAL_COMMUNITY)
Admission: RE | Admit: 2021-08-02 | Discharge: 2021-08-02 | Disposition: A | Discharge status: Home / Self Care | Payer: Medicare Other | Source: Ambulatory Visit | Attending: Cardiovascular Disease | Admitting: Cardiovascular Disease

## 2021-08-02 DIAGNOSIS — E119 Type 2 diabetes mellitus without complications: Secondary | ICD-10-CM | POA: Diagnosis not present

## 2021-08-02 DIAGNOSIS — Z955 Presence of coronary angioplasty implant and graft: Secondary | ICD-10-CM | POA: Diagnosis not present

## 2021-08-02 DIAGNOSIS — Z48812 Encounter for surgical aftercare following surgery on the circulatory system: Secondary | ICD-10-CM | POA: Diagnosis not present

## 2021-08-05 ENCOUNTER — Encounter (HOSPITAL_COMMUNITY)
Admission: RE | Admit: 2021-08-05 | Discharge: 2021-08-05 | Disposition: A | Discharge status: Home / Self Care | Payer: Medicare Other | Source: Ambulatory Visit | Attending: Cardiovascular Disease | Admitting: Cardiovascular Disease

## 2021-08-05 DIAGNOSIS — Z955 Presence of coronary angioplasty implant and graft: Secondary | ICD-10-CM

## 2021-08-05 DIAGNOSIS — Z48812 Encounter for surgical aftercare following surgery on the circulatory system: Secondary | ICD-10-CM | POA: Diagnosis not present

## 2021-08-05 DIAGNOSIS — E119 Type 2 diabetes mellitus without complications: Secondary | ICD-10-CM | POA: Diagnosis not present

## 2021-08-07 ENCOUNTER — Encounter (HOSPITAL_COMMUNITY)
Admission: RE | Admit: 2021-08-07 | Discharge: 2021-08-07 | Disposition: A | Discharge status: Home / Self Care | Payer: Medicare Other | Source: Ambulatory Visit | Attending: Cardiovascular Disease | Admitting: Cardiovascular Disease

## 2021-08-07 DIAGNOSIS — Z955 Presence of coronary angioplasty implant and graft: Secondary | ICD-10-CM

## 2021-08-07 DIAGNOSIS — Z48812 Encounter for surgical aftercare following surgery on the circulatory system: Secondary | ICD-10-CM | POA: Diagnosis not present

## 2021-08-07 DIAGNOSIS — E119 Type 2 diabetes mellitus without complications: Secondary | ICD-10-CM | POA: Diagnosis not present

## 2021-08-09 ENCOUNTER — Encounter (HOSPITAL_COMMUNITY)
Admission: RE | Admit: 2021-08-09 | Discharge: 2021-08-09 | Disposition: A | Discharge status: Home / Self Care | Payer: Medicare Other | Source: Ambulatory Visit | Attending: Cardiovascular Disease | Admitting: Cardiovascular Disease

## 2021-08-09 DIAGNOSIS — Z48812 Encounter for surgical aftercare following surgery on the circulatory system: Secondary | ICD-10-CM | POA: Diagnosis not present

## 2021-08-09 DIAGNOSIS — Z955 Presence of coronary angioplasty implant and graft: Secondary | ICD-10-CM

## 2021-08-09 DIAGNOSIS — E119 Type 2 diabetes mellitus without complications: Secondary | ICD-10-CM | POA: Diagnosis not present

## 2021-08-12 ENCOUNTER — Encounter (HOSPITAL_COMMUNITY)
Admission: RE | Admit: 2021-08-12 | Discharge: 2021-08-12 | Disposition: A | Discharge status: Home / Self Care | Payer: Medicare Other | Source: Ambulatory Visit | Attending: Cardiovascular Disease | Admitting: Cardiovascular Disease

## 2021-08-12 DIAGNOSIS — E119 Type 2 diabetes mellitus without complications: Secondary | ICD-10-CM | POA: Diagnosis not present

## 2021-08-12 DIAGNOSIS — Z955 Presence of coronary angioplasty implant and graft: Secondary | ICD-10-CM | POA: Diagnosis not present

## 2021-08-12 DIAGNOSIS — Z48812 Encounter for surgical aftercare following surgery on the circulatory system: Secondary | ICD-10-CM | POA: Diagnosis not present

## 2021-08-14 ENCOUNTER — Encounter (HOSPITAL_COMMUNITY)
Admission: RE | Admit: 2021-08-14 | Discharge: 2021-08-14 | Disposition: A | Discharge status: Home / Self Care | Payer: Medicare Other | Source: Ambulatory Visit | Attending: Cardiovascular Disease | Admitting: Cardiovascular Disease

## 2021-08-14 DIAGNOSIS — Z955 Presence of coronary angioplasty implant and graft: Secondary | ICD-10-CM | POA: Diagnosis not present

## 2021-08-14 DIAGNOSIS — E119 Type 2 diabetes mellitus without complications: Secondary | ICD-10-CM | POA: Diagnosis not present

## 2021-08-14 DIAGNOSIS — Z48812 Encounter for surgical aftercare following surgery on the circulatory system: Secondary | ICD-10-CM | POA: Diagnosis not present

## 2021-08-15 ENCOUNTER — Other Ambulatory Visit: Payer: Self-pay

## 2021-08-15 DIAGNOSIS — E1169 Type 2 diabetes mellitus with other specified complication: Secondary | ICD-10-CM

## 2021-08-15 MED ORDER — EMPAGLIFLOZIN 10 MG PO TABS: 10.0000 mg | ORAL_TABLET | Freq: Every day | ORAL | 3 refills | Status: DC

## 2021-08-16 ENCOUNTER — Encounter (HOSPITAL_COMMUNITY)
Admission: RE | Admit: 2021-08-16 | Discharge: 2021-08-16 | Disposition: A | Discharge status: Home / Self Care | Payer: Medicare Other | Source: Ambulatory Visit | Attending: Cardiovascular Disease | Admitting: Cardiovascular Disease

## 2021-08-16 DIAGNOSIS — E119 Type 2 diabetes mellitus without complications: Secondary | ICD-10-CM | POA: Diagnosis not present

## 2021-08-16 DIAGNOSIS — Z955 Presence of coronary angioplasty implant and graft: Secondary | ICD-10-CM

## 2021-08-16 DIAGNOSIS — Z48812 Encounter for surgical aftercare following surgery on the circulatory system: Secondary | ICD-10-CM | POA: Diagnosis not present

## 2021-08-21 ENCOUNTER — Encounter (HOSPITAL_COMMUNITY)
Admission: RE | Admit: 2021-08-21 | Discharge: 2021-08-21 | Disposition: A | Discharge status: Home / Self Care | Payer: Medicare Other | Source: Ambulatory Visit | Attending: Cardiovascular Disease | Admitting: Cardiovascular Disease

## 2021-08-21 DIAGNOSIS — Z955 Presence of coronary angioplasty implant and graft: Secondary | ICD-10-CM | POA: Diagnosis not present

## 2021-08-21 DIAGNOSIS — Z48812 Encounter for surgical aftercare following surgery on the circulatory system: Secondary | ICD-10-CM | POA: Diagnosis not present

## 2021-08-21 DIAGNOSIS — E119 Type 2 diabetes mellitus without complications: Secondary | ICD-10-CM | POA: Diagnosis not present

## 2021-08-21 LAB — GLUCOSE, CAPILLARY: Glucose-Capillary: 149 mg/dL — ABNORMAL HIGH (ref 70–99)

## 2021-08-23 ENCOUNTER — Encounter (HOSPITAL_COMMUNITY)
Admission: RE | Admit: 2021-08-23 | Discharge: 2021-08-23 | Disposition: A | Discharge status: Home / Self Care | Payer: Medicare Other | Source: Ambulatory Visit | Attending: Cardiovascular Disease | Admitting: Cardiovascular Disease

## 2021-08-23 DIAGNOSIS — Z955 Presence of coronary angioplasty implant and graft: Secondary | ICD-10-CM | POA: Diagnosis not present

## 2021-08-26 ENCOUNTER — Encounter (HOSPITAL_COMMUNITY)
Admission: RE | Admit: 2021-08-26 | Discharge: 2021-08-26 | Disposition: A | Discharge status: Home / Self Care | Payer: Medicare Other | Source: Ambulatory Visit | Attending: Cardiovascular Disease | Admitting: Cardiovascular Disease

## 2021-08-26 DIAGNOSIS — Z955 Presence of coronary angioplasty implant and graft: Secondary | ICD-10-CM | POA: Diagnosis not present

## 2021-08-26 NOTE — Progress Notes (Signed)
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  Patient's Home Medications on Admission:  Current Outpatient Medications:    acetaminophen (TYLENOL) 500 MG tablet, Take 1,000 mg by mouth every 6 (six) hours as needed for mild pain or moderate pain., Disp: , Rfl:    amLODipine (NORVASC) 10 MG tablet, TAKE 1 TABLET BY MOUTH EVERY DAY (Patient taking differently: Take 10 mg by mouth at bedtime.), Disp: 90 tablet, Rfl: 1   anastrozole (ARIMIDEX) 1 MG tablet, TAKE 1 TABLET(1 MG) BY MOUTH DAILY, Disp: 30 tablet, Rfl: 3   Ascorbic Acid (VITAMIN C) 1000 MG tablet, Take 1,000 mg by mouth daily., Disp: , Rfl:    aspirin EC 81 MG tablet, Take 81 mg by mouth daily., Disp: , Rfl:    atorvastatin (LIPITOR) 80 MG tablet, Take 1 tablet (80 mg total) by mouth daily., Disp: 90 tablet, Rfl: 3   cholecalciferol (VITAMIN D) 1000 UNITS tablet, Take 1,000 Units by mouth daily. , Disp: , Rfl:    clopidogrel (PLAVIX) 75 MG tablet, Take 1 tablet (75 mg total) by mouth daily with breakfast., Disp: 90 tablet, Rfl: 3   empagliflozin (JARDIANCE) 10 MG TABS tablet, Take 1 tablet (10 mg total) by mouth daily before breakfast., Disp: 30 tablet, Rfl: 3   FLUZONE HIGH-DOSE QUADRIVALENT 0.7 ML SUSY, , Disp: , Rfl:    glimepiride (AMARYL) 4 MG tablet, TAKE 1 TABLET BY MOUTH EVERY MORNING, Disp: 90 tablet, Rfl: 1   metoprolol succinate (TOPROL XL) 25 MG 24 hr tablet, Take 1/2 tablet (12.5 mg total) by mouth daily., Disp: 90 tablet, Rfl: 3   Multiple Vitamin (MULTIVITAMIN WITH MINERALS) TABS  tablet, Take 1 tablet by mouth daily. , Disp: , Rfl:    nitroGLYCERIN (NITROSTAT) 0.4 MG SL tablet, Place 1 tablet (0.4 mg total) under the tongue every 5 (five) minutes as needed for chest pain., Disp: 25 tablet, Rfl: 1   olmesartan (BENICAR) 40 MG tablet, TAKE 1 TABLET(40 MG) BY MOUTH DAILY, Disp: 90 tablet, Rfl: 1   ONETOUCH ULTRA test strip, USE AS DIRECTED TWICE DAILY, Disp: 100 strip, Rfl: 3   pantoprazole (PROTONIX) 40 MG tablet, Take 1 tablet (40 mg total) by mouth daily., Disp: 90 tablet, Rfl: 3   PFIZER COVID-19 VAC BIVALENT injection, , Disp: , Rfl:    Polyethyl Glycol-Propyl Glycol (SYSTANE OP), Place 1 drop into both eyes 2 (two) times daily., Disp: , Rfl:    polyethylene glycol (MIRALAX / GLYCOLAX) packet, Take 17 g by mouth daily. , Disp: , Rfl:   Past Medical History: Past Medical History:  Diagnosis Date   Allergy    Anemia    Blood transfusion without reported diagnosis    Breast cancer ([place]) 11/2018   Right breast invasive mammary carcinoma   Cancer ([place])    Cataract    Chronic kidney disease    Diabetes mellitus without complication ([place])    Esophageal ulcer with bleeding 05/05/2013   05/05/2013 EGD linear distal esophageal ulcer - cause of hematemesis prior to EGD    GERD (gastroesophageal  reflux disease)    History of kidney stones    Hyperlipidemia    Hypertension    Kidney cysts    Personal history of radiation therapy    Sarcoidosis 1970    Tobacco Use: Social History   Tobacco Use  Smoking Status Never  Smokeless Tobacco Never    Labs: Review Flowsheet        Latest Ref Rng & Units 04/18/2020 09/11/2020 12/04/2020 04/08/2021  Labs for ITP Cardiac and Pulmonary Rehab  Cholestrol 100 - 199 mg/dL  189   199     LDL (calc) 0 - 99 mg/dL  84   86     HDL-C >39 mg/dL  98   107     Trlycerides 0 - 149 mg/dL  31   29     Hemoglobin A1c 4.8 - 5.6 % 6.8   6.6   6.2   7.7       06/12/2021  Labs for ITP Cardiac and Pulmonary Rehab  Cholestrol 163    LDL  (calc) 66    HDL-C 87    Trlycerides 45    Hemoglobin A1c 6.7            Capillary Blood Glucose: Lab Results  Component Value Date   GLUCAP 149 (H) 08/21/2021   GLUCAP 167 (H) 07/03/2021   GLUCAP 179 (H) 07/03/2021   GLUCAP 150 (H) 07/01/2021   GLUCAP 164 (H) 07/01/2021     Exercise Target Goals: Exercise Program Goal: Individual exercise prescription set using results from initial 6 min walk test and THRR while considering  patient's activity barriers and safety.   Exercise Prescription Goal: Initial exercise prescription builds to 30-45 minutes a day of aerobic activity, 2-3 days per week.  Home exercise guidelines will be given to patient during program as part of exercise prescription that the participant will acknowledge.  Activity Barriers & Risk Stratification:  Activity Barriers & Cardiac Risk Stratification - 06/25/21 0915       Activity Barriers & Cardiac Risk Stratification   Activity Barriers Arthritis;Back Problems;Assistive Device    Cardiac Risk Stratification Moderate             6 Minute Walk:  6 Minute Walk     Row Name 06/25/21 0934         6 Minute Walk   Phase Initial     Distance 1269 feet     Walk Time 6 minutes     # of Rest Breaks 2     MPH 2.4     METS 2.64     RPE 13     Perceived Dyspnea  1     VO2 Peak 9.26     Symptoms Yes (comment)     Comments Patient c/o tiredness, tightness across her back, mild SOB, RPD= 1.     Resting HR 65 bpm     Resting BP 144/60     Resting Oxygen Saturation  99 %     Exercise Oxygen Saturation  during 6 min walk 99 %     Max Ex. HR 98 bpm     Max Ex. BP 134/54     2 Minute Post BP 138/60              Oxygen Initial Assessment:   Oxygen Re-Evaluation:   Oxygen Discharge (Final Oxygen Re-Evaluation):   Initial Exercise Prescription:  Initial Exercise Prescription - 06/25/21 1000       Date of Initial Exercise RX  and Referring Provider   Date 06/25/21    Referring Provider  [name], [name], MD    Expected Discharge Date 08/23/21      Bike   Level 1.5    Minutes 15    METs 2.2      NuStep   Level 2    SPM 85    Minutes 15    METs 2.2      Prescription Details   Frequency (times per week) 3    Duration Progress to 30 minutes of continuous aerobic without signs/symptoms of physical distress      Intensity   THRR 40-80% of Max Heartrate 57-114    Ratings of Perceived Exertion 11-13    Perceived Dyspnea 0-4      Progression   Progression Continue to progress workloads to maintain intensity without signs/symptoms of physical distress.      Resistance Training   Training Prescription Yes    Weight 2 lbs.    Reps 10-15             Perform Capillary Blood Glucose checks as needed.  Exercise Prescription Changes:   Exercise Prescription Changes     Row Name 07/01/21 1019 07/15/21 1021 07/29/21 1019 08/12/21 1019 08/26/21 1027     Response to Exercise   Blood Pressure (Admit) 142/64 118/70 134/62 122/78 138/70   Blood Pressure (Exercise) 140/80 148/60 128/64 122/72 158/60   Blood Pressure (Exit) 122/64 110/60 102/60 114/60 120/60   Heart Rate (Admit) 65 bpm 63 bpm 55 bpm 77 bpm 63 bpm   Heart Rate (Exercise) 89 bpm 100 bpm 87 bpm 117 bpm 96 bpm   Heart Rate (Exit) 68 bpm 70 bpm 64 bpm 83 bpm 59 bpm   Rating of Perceived Exertion (Exercise) '13 13 12 11 11   '$ Symptoms None None None None None   Comments Off to a good start with exercise. Decreased WL on bike from 1.5 to 1.0. -- -- Increased weights from 2 to 3 lbs today. --   Duration Continue with 30 min of aerobic exercise without signs/symptoms of physical distress. Continue with 30 min of aerobic exercise without signs/symptoms of physical distress. Continue with 30 min of aerobic exercise without signs/symptoms of physical distress. Continue with 30 min of aerobic exercise without signs/symptoms of physical distress. Continue with 30 min of aerobic exercise without signs/symptoms of  physical distress.   Intensity THRR unchanged THRR unchanged THRR unchanged THRR unchanged THRR unchanged     Progression   Progression Continue to progress workloads to maintain intensity without signs/symptoms of physical distress. Continue to progress workloads to maintain intensity without signs/symptoms of physical distress. Continue to progress workloads to maintain intensity without signs/symptoms of physical distress. Continue to progress workloads to maintain intensity without signs/symptoms of physical distress. Continue to progress workloads to maintain intensity without signs/symptoms of physical distress.   Average METs 2.2 2.6 2.9 3.2 3.1     Resistance Training   Training Prescription Yes Yes Yes Yes Yes   Weight 2 lbs. 2 lbs. 2 lbs. 3 lbs 3 lbs   Reps 10-15 10-15 10-15 10-15 10-15   Time 10 Minutes 10 Minutes 10 Minutes 10 Minutes 10 Minutes     Interval Training   Interval Training No No No No No     Bike   Level 1 1 1.'5 2 2   '$ Minutes '15 15 15 15 15   '$ METs 2 2.7 3.3 4.2 4.2     NuStep   Level  $'2 2 2 2 2   'Y$ SPM 85 85 85 85 85   Minutes '15 15 15 15 15   '$ METs 2.4 2.5 2.6 2.3 2     Home Exercise Plan   Plans to continue exercise at -- -- Home (comment)  Stationary bike Home (comment)  Stationary bike Home (comment)  Stationary bike   Frequency -- -- Add 3 additional days to program exercise sessions. Add 3 additional days to program exercise sessions. Add 3 additional days to program exercise sessions.   Initial Home Exercises Provided -- -- 07/26/21 07/26/21 07/26/21            Exercise Comments:   Exercise Comments     Row Name 07/01/21 1138 07/17/21 1102 07/26/21 1049 07/29/21 1048 08/12/21 1113   Exercise Comments Patient tolerated first session of exercise fair without symptoms. Workload on bike decreased from 1.5 to 1.0 because of difficulty. Will try recumbent bike instead of bike next session. Reviewed METs with patient. Reviewed home exercise guidelines  and goals with patient. Reviewed METs with patient. Reviewed METs and goals with patient. Increased hand weights today and tolerated well.    [employer] Name 08/26/21 1016           Exercise Comments Reviewed METs and goals with patient.                Exercise Goals and Review:   Exercise Goals     Row Name 06/25/21 0838             Exercise Goals   Increase Physical Activity Yes       Intervention Provide advice, education, support and counseling about physical activity/exercise needs.;Develop an individualized exercise prescription for aerobic and resistive training based on initial evaluation findings, risk stratification, comorbidities and participant's personal goals.       Expected Outcomes Short Term: Attend rehab on a regular basis to increase amount of physical activity.;Long Term: Exercising regularly at least 3-5 days a week.;Long Term: Add in home exercise to make exercise part of routine and to increase amount of physical activity.       Increase Strength and Stamina Yes       Intervention Provide advice, education, support and counseling about physical activity/exercise needs.;Develop an individualized exercise prescription for aerobic and resistive training based on initial evaluation findings, risk stratification, comorbidities and participant's personal goals.       Expected Outcomes Short Term: Increase workloads from initial exercise prescription for resistance, speed, and METs.;Short Term: Perform resistance training exercises routinely during rehab and add in resistance training at home;Long Term: Improve cardiorespiratory fitness, muscular endurance and strength as measured by increased METs and functional capacity (6MWT)       Able to understand and use rate of perceived exertion (RPE) scale Yes       Intervention Provide education and explanation on how to use RPE scale       Expected Outcomes Short Term: Able to use RPE daily in rehab to express subjective  intensity level;Long Term:  Able to use RPE to guide intensity level when exercising independently       Knowledge and understanding of Target Heart Rate Range (THRR) Yes       Intervention Provide education and explanation of THRR including how the numbers were predicted and where they are located for reference       Expected Outcomes Short Term: Able to state/look up THRR;Long Term: Able to use THRR to govern intensity when exercising independently;Short  Term: Able to use daily as guideline for intensity in rehab       Able to check pulse independently Yes       Intervention Provide education and demonstration on how to check pulse in carotid and radial arteries.;Review the importance of being able to check your own pulse for safety during independent exercise       Expected Outcomes Short Term: Able to explain why pulse checking is important during independent exercise;Long Term: Able to check pulse independently and accurately       Understanding of Exercise Prescription Yes       Intervention Provide education, explanation, and written materials on patient's individual exercise prescription       Expected Outcomes Short Term: Able to explain program exercise prescription;Long Term: Able to explain home exercise prescription to exercise independently                Exercise Goals Re-Evaluation :  Exercise Goals Re-Evaluation     Row Name 07/01/21 1138 07/26/21 1049 08/12/21 1113 08/26/21 1016       Exercise Goal Re-Evaluation   Exercise Goals Review Increase Physical Activity;Able to understand and use rate of perceived exertion (RPE) scale Increase Physical Activity;Able to understand and use rate of perceived exertion (RPE) scale;Increase Strength and Stamina;Knowledge and understanding of Target Heart Rate Range (THRR);Able to check pulse independently;Understanding of Exercise Prescription Increase Physical Activity;Able to understand and use rate of perceived exertion (RPE)  scale;Increase Strength and Stamina;Knowledge and understanding of Target Heart Rate Range (THRR);Able to check pulse independently;Understanding of Exercise Prescription Increase Physical Activity;Able to understand and use rate of perceived exertion (RPE) scale;Increase Strength and Stamina;Knowledge and understanding of Target Heart Rate Range (THRR);Able to check pulse independently;Understanding of Exercise Prescription    Comments Patient able to understand and use RPE scale appropriately. Patient is currently riding her stationary bike 15 minutes, 2-3 days/week as her mode of home exercise. Sometimes patient is riding her bike on the same days she attends cardiac rehab. I suggested patient increase duration with a goal of achieving 30 minutes 2 days that she doesn't attend caridac rehab, and patient is agreeable to this. Patient previously exercised at the [facility], participating in [place] 60 minutes once/week and plans to resume upon completion of the cardiac rehab program. Patient knows how to manually count her pulse. Patient is also active gardening. Patient continues to progress well with exericse. Patient is riding her stationary bike at home 2 days/week and is doing yard work without any issues. Patient continue to express concern about weight loss. Patient is working with dietitian. Increased hand weights today from 2 to 3 lbs to help build muscle/increase strength and tolerated well. Patient is scheduled to complete the cardiac rehab program soon and is progressing well. [facility] and riding her stationary bike at home at least 3 days/week, 30-60 minutes.    Expected Outcomes Progress workloads as tolerated to help increase cardiorespiratory fitness. Patient will increase exercise duration on her bike at home from 15 to 30 minutes. Patient will exercise 2 days/week at home on days she doesn't attend cardiac rehab. Continue current exercise  routine to help increase strength and build muscle. Continue to progress workloads as tolerated.             Discharge Exercise Prescription (Final Exercise Prescription Changes):  Exercise Prescription Changes - 08/26/21 1027       Response to Exercise   Blood Pressure (Admit)  138/70    Blood Pressure (Exercise) 158/60    Blood Pressure (Exit) 120/60    Heart Rate (Admit) 63 bpm    Heart Rate (Exercise) 96 bpm    Heart Rate (Exit) 59 bpm    Rating of Perceived Exertion (Exercise) 11    Symptoms None    Duration Continue with 30 min of aerobic exercise without signs/symptoms of physical distress.    Intensity THRR unchanged      Progression   Progression Continue to progress workloads to maintain intensity without signs/symptoms of physical distress.    Average METs 3.1      Resistance Training   Training Prescription Yes    Weight 3 lbs    Reps 10-15    Time 10 Minutes      Interval Training   Interval Training No      Bike   Level 2    Minutes 15    METs 4.2      NuStep   Level 2    SPM 85    Minutes 15    METs 2      Home Exercise Plan   Plans to continue exercise at Home (comment)   Stationary bike   Frequency Add 3 additional days to program exercise sessions.    Initial Home Exercises Provided 07/26/21             Nutrition:  Target Goals: Understanding of nutrition guidelines, daily intake of sodium '1500mg'$ , cholesterol '200mg'$ , calories 30% from fat and 7% or less from saturated fats, daily to have 5 or more servings of fruits and vegetables.  Biometrics:  Pre Biometrics - 06/25/21 0830       Pre Biometrics   Waist Circumference 34.75 inches    Hip Circumference 38.75 inches    Waist to Hip Ratio 0.9 %    Triceps Skinfold 13 mm    % Body Fat 32.7 %    Grip Strength 18 kg    Flexibility 14.25 in    Single Leg Stand 13 seconds              Nutrition Therapy Plan and Nutrition Goals:  Nutrition Therapy & Goals - 08/07/21 1306        Nutrition Therapy   Diet Heart Healthy Diet    Drug/Food Interactions Statins/Certain Fruits    Protein (specify units) 74g-86g/day (1.2-1.4g/kg ideal body weight), minimum 49g-61g/day (0.8-1.0g/day ideal body weight)      Personal Nutrition Goals   Nutrition Goal Patient to add 2 snacks of ~200-300kcals/day to include lean protein + high quality carbohydrate   Choosing roasted nuts, fruit as snack throughout the day. Plans to go ahead and increase to two supplements per day (Glucerna, 180kcals, 15g protein + Boost 240 kcals, 10g protein) to support weight gain to 135#.   Personal Goal #2 Patient to prioritize lean protein intake to support weight management/gain and a heart healthy diet to include lean protein/plant protein, nuts/seeds, low fat dairy, etc at all meals and snacks.    Comments Patient continues to be motivated to maintain/gain weight. We discussed strategies for weight gain including nutrition supplements (Boost 2x/day, 480kcals, 10g protein), increasing eating frequency and/or portions, and increasing fat intake. Patient is very active throughout the day spending significant parts of her day gardening.      Intervention Plan   Intervention Prescribe, educate and counsel regarding individualized specific dietary modifications aiming towards targeted core components such as weight, hypertension, lipid management, diabetes, heart failure and  other comorbidities.;Nutrition handout(s) given to patient.    Expected Outcomes Short Term Goal: Understand basic principles of dietary content, such as calories, fat, sodium, cholesterol and nutrients.;Short Term Goal: A plan has been developed with personal nutrition goals set during dietitian appointment.;Long Term Goal: Adherence to prescribed nutrition plan.             Nutrition Assessments:  Nutrition Assessments - 07/03/21 1206       Rate Your Plate Scores   Pre Score 65            MEDIFICTS Score Key: ?70 Need to  make dietary changes  40-70 Heart Healthy Diet ? 40 Therapeutic Level Cholesterol Diet   Flowsheet Row CARDIAC REHAB PHASE II EXERCISE from 07/03/2021 in [facility]  Picture Your Plate Total Score on Admission 65      Picture Your Plate Scores: <09 Unhealthy dietary pattern with much room for improvement. 41-50 Dietary pattern unlikely to meet recommendations for good health and room for improvement. 51-60 More healthful dietary pattern, with some room for improvement.  >60 Healthy dietary pattern, although there may be some specific behaviors that could be improved.    Nutrition Goals Re-Evaluation:   Nutrition Goals Re-Evaluation:   Nutrition Goals Discharge (Final Nutrition Goals Re-Evaluation):   Psychosocial: Target Goals: Acknowledge presence or absence of significant depression and/or stress, maximize coping skills, provide positive support system. Participant is able to verbalize types and ability to use techniques and skills needed for reducing stress and depression.  Initial Review & Psychosocial Screening:  Initial Psych Review & Screening - 06/25/21 1019       Initial Review   Current issues with None Identified      Family Dynamics   Good Support System? Yes    Comments Emer has positive outlook and denies any psychosocial needs. Derika feels supported by her many friends and family.  Daughter is moving to the area this summer.      Barriers   Psychosocial barriers to participate in program There are no identifiable barriers or psychosocial needs.;The patient should benefit from training in stress management and relaxation.      Screening Interventions   Interventions Encouraged to exercise;Provide feedback about the scores to participant    Expected Outcomes Short Term goal: Identification and review with participant of any Quality of Life or Depression concerns found by scoring the questionnaire.;Long Term goal: The participant  improves quality of Life and PHQ9 Scores as seen by post scores and/or verbalization of changes             Quality of Life Scores:  Quality of Life - 06/25/21 1029       Quality of Life   Select Quality of Life      Quality of Life Scores   Health/Function Pre 27.93 %    Socioeconomic Pre 27 %    Psych/Spiritual Pre 28.8 %    Family Pre 28.5 %    GLOBAL Pre 28 %            Scores of 19 and below usually indicate a poorer quality of life in these areas.  A difference of  2-3 points is a clinically meaningful difference.  A difference of 2-3 points in the total score of the Quality of Life Index has been associated with significant improvement in overall quality of life, self-image, physical symptoms, and general health in studies assessing change in quality of life.  PHQ-9: Review Flowsheet  06/25/2021 06/12/2021 08/02/2020 07/19/2020 07/14/2019  Depression screen PHQ 2/9  Decreased Interest 0 0 0 0 0  Down, Depressed, Hopeless 0 0 0 0 0  PHQ - 2 Score 0 0 0 0 0  Altered sleeping 0    0  Tired, decreased energy 0    0  Change in appetite 0    0  Feeling bad or failure about yourself  0    0  Trouble concentrating 0    0  Moving slowly or fidgety/restless 0    0  Suicidal thoughts 0      PHQ-9 Score 0    0  Difficult doing work/chores Not difficult at all    Not difficult at all         Interpretation of Total Score  Total Score Depression Severity:  1-4 = Minimal depression, 5-9 = Mild depression, 10-14 = Moderate depression, 15-19 = Moderately severe depression, 20-27 = Severe depression   Psychosocial Evaluation and Intervention:   Psychosocial Re-Evaluation:  Psychosocial Re-Evaluation     Row Name 07/01/21 1628 07/30/21 0818 08/26/21 1736         Psychosocial Re-Evaluation   Current issues with None Identified None Identified Current Stress Concerns     Comments -- -- Delcie Roch voiced some concerns as she has lost about 2 kg since starting cardiac  rehab.     Expected Outcomes -- -- Analei will have controlled stress upon completion of phase 2 cardiac rehab.     Interventions Encouraged to attend Cardiac Rehabilitation for the exercise Encouraged to attend Cardiac Rehabilitation for the exercise Encouraged to attend Cardiac Rehabilitation for the exercise     Continue Psychosocial Services  No Follow up required No Follow up required No Follow up required       Initial Review   Comments -- -- Rashidah will discuss with her primary care provider.              Psychosocial Discharge (Final Psychosocial Re-Evaluation):  Psychosocial Re-Evaluation - 08/26/21 1736       Psychosocial Re-Evaluation   Current issues with Current Stress Concerns    Comments Brannon voiced some concerns as she has lost about 2 kg since starting cardiac rehab.    Expected Outcomes Lavanna will have controlled stress upon completion of phase 2 cardiac rehab.    Interventions Encouraged to attend Cardiac Rehabilitation for the exercise    Continue Psychosocial Services  No Follow up required      Initial Review   Comments Bessy will discuss with her primary care provider.             Vocational Rehabilitation: Provide vocational rehab assistance to qualifying candidates.   Vocational Rehab Evaluation & Intervention:  Vocational Rehab - 06/25/21 1025       Initial Vocational Rehab Evaluation & Intervention   Assessment shows need for Vocational Rehabilitation No             Education: Education Goals: Education classes will be provided on a weekly basis, covering required topics. Participant will state understanding/return demonstration of topics presented.  Learning Barriers/Preferences:  Learning Barriers/Preferences - 06/25/21 1026       Learning Barriers/Preferences   Learning Barriers Sight    Learning Preferences Individual Instruction;Verbal Instruction;Written Material;Video;Audio             Education Topics: Count Your  Pulse:  -Group instruction provided by verbal instruction, demonstration, patient participation and written materials to support subject.  Instructors address importance  of being able to find your pulse and how to count your pulse when at home without a heart monitor.  Patients get hands on experience counting their pulse with staff help and individually.   Heart Attack, Angina, and Risk Factor Modification:  -Group instruction provided by verbal instruction, video, and written materials to support subject.  Instructors address signs and symptoms of angina and heart attacks.    Also discuss risk factors for heart disease and how to make changes to improve heart health risk factors.   Functional Fitness:  -Group instruction provided by verbal instruction, demonstration, patient participation, and written materials to support subject.  Instructors address safety measures for doing things around the house.  Discuss how to get up and down off the floor, how to pick things up properly, how to safely get out of a chair without assistance, and balance training.   Meditation and Mindfulness:  -Group instruction provided by verbal instruction, patient participation, and written materials to support subject.  Instructor addresses importance of mindfulness and meditation practice to help reduce stress and improve awareness.  Instructor also leads participants through a meditation exercise.    Stretching for Flexibility and Mobility:  -Group instruction provided by verbal instruction, patient participation, and written materials to support subject.  Instructors lead participants through series of stretches that are designed to increase flexibility thus improving mobility.  These stretches are additional exercise for major muscle groups that are typically performed during regular warm up and cool down.   Hands Only CPR:  -Group verbal, video, and participation provides a basic overview of AHA guidelines for  community CPR. Role-play of emergencies allow participants the opportunity to practice calling for help and chest compression technique with discussion of AED use.   Hypertension: -Group verbal and written instruction that provides a basic overview of hypertension including the most recent diagnostic guidelines, risk factor reduction with self-care instructions and medication management.    Nutrition I class: Heart Healthy Eating:  -Group instruction provided by PowerPoint slides, verbal discussion, and written materials to support subject matter. The instructor gives an explanation and review of the Therapeutic Lifestyle Changes diet recommendations, which includes a discussion on lipid goals, dietary fat, sodium, fiber, plant stanol/sterol esters, sugar, and the components of a well-balanced, healthy diet.   Nutrition II class: Lifestyle Skills:  -Group instruction provided by PowerPoint slides, verbal discussion, and written materials to support subject matter. The instructor gives an explanation and review of label reading, grocery shopping for heart health, heart healthy recipe modifications, and ways to make healthier choices when eating out.   Diabetes Question & Answer:  -Group instruction provided by PowerPoint slides, verbal discussion, and written materials to support subject matter. The instructor gives an explanation and review of diabetes co-morbidities, pre- and post-prandial blood glucose goals, pre-exercise blood glucose goals, signs, symptoms, and treatment of hypoglycemia and hyperglycemia, and foot care basics.   Diabetes Blitz:  -Group instruction provided by PowerPoint slides, verbal discussion, and written materials to support subject matter. The instructor gives an explanation and review of the physiology behind type 1 and type 2 diabetes, diabetes medications and rational behind using different medications, pre- and post-prandial blood glucose recommendations and  Hemoglobin A1c goals, diabetes diet, and exercise including blood glucose guidelines for exercising safely.    Portion Distortion:  -Group instruction provided by PowerPoint slides, verbal discussion, written materials, and food models to support subject matter. The instructor gives an explanation of serving size versus portion size, changes in portions  sizes over the last 20 years, and what consists of a serving from each food group.   Stress Management:  -Group instruction provided by verbal instruction, video, and written materials to support subject matter.  Instructors review role of stress in heart disease and how to cope with stress positively.     Exercising on Your Own:  -Group instruction provided by verbal instruction, power point, and written materials to support subject.  Instructors discuss benefits of exercise, components of exercise, frequency and intensity of exercise, and end points for exercise.  Also discuss use of nitroglycerin and activating EMS.  Review options of places to exercise outside of rehab.  Review guidelines for sex with heart disease.   Cardiac Drugs I:  -Group instruction provided by verbal instruction and written materials to support subject.  Instructor reviews cardiac drug classes: antiplatelets, anticoagulants, beta blockers, and statins.  Instructor discusses reasons, side effects, and lifestyle considerations for each drug class.   Cardiac Drugs II:  -Group instruction provided by verbal instruction and written materials to support subject.  Instructor reviews cardiac drug classes: angiotensin converting enzyme inhibitors (ACE-I), angiotensin II receptor blockers (ARBs), nitrates, and calcium channel blockers.  Instructor discusses reasons, side effects, and lifestyle considerations for each drug class.   Anatomy and Physiology of the Circulatory System:  Group verbal and written instruction and models provide basic cardiac anatomy and physiology,  with the coronary electrical and arterial systems. Review of: AMI, Angina, Valve disease, Heart Failure, Peripheral Artery Disease, Cardiac Arrhythmia, Pacemakers, and the ICD.   Other Education:  -Group or individual verbal, written, or video instructions that support the educational goals of the cardiac rehab program.   Holiday Eating Survival Tips:  -Group instruction provided by PowerPoint slides, verbal discussion, and written materials to support subject matter. The instructor gives patients tips, tricks, and techniques to help them not only survive but enjoy the holidays despite the onslaught of food that accompanies the holidays.   Knowledge Questionnaire Score:  Knowledge Questionnaire Score - 06/25/21 1029       Knowledge Questionnaire Score   Pre Score 15/24             Core Components/Risk Factors/Patient Goals at Admission:  Personal Goals and Risk Factors at Admission - 06/25/21 0840       Core Components/Risk Factors/Patient Goals on Admission    Weight Management Weight Gain;Yes    Intervention Weight Management: Provide education and appropriate resources to help participant work on and attain dietary goals.;Weight Management: Develop a combined nutrition and exercise program designed to reach desired caloric intake, while maintaining appropriate intake of nutrient and fiber, sodium and fats, and appropriate energy expenditure required for the weight goal.    Admit Weight 124 lb 12.5 oz (56.6 kg)    Goal Weight: Short Term 130 lb (59 kg)    Goal Weight: Long Term 135 lb (61.2 kg)    Expected Outcomes Short Term: Continue to assess and modify interventions until short term weight is achieved;Long Term: Adherence to nutrition and physical activity/exercise program aimed toward attainment of established weight goal;Weight Gain: Understanding of general recommendations for a high calorie, high protein meal plan that promotes weight gain by distributing calorie intake  throughout the day with the consumption for 4-5 meals, snacks, and/or supplements    Diabetes Yes    Intervention Provide education about signs/symptoms and action to take for hypo/hyperglycemia.;Provide education about proper nutrition, including hydration, and aerobic/resistive exercise prescription along with prescribed medications to achieve blood  glucose in normal ranges: Fasting glucose 65-99 mg/dL    Expected Outcomes Short Term: Participant verbalizes understanding of the signs/symptoms and immediate care of hyper/hypoglycemia, proper foot care and importance of medication, aerobic/resistive exercise and nutrition plan for blood glucose control.;Long Term: Attainment of HbA1C < 7%.    Hypertension Yes    Intervention Provide education on lifestyle modifcations including regular physical activity/exercise, weight management, moderate sodium restriction and increased consumption of fresh fruit, vegetables, and low fat dairy, alcohol moderation, and smoking cessation.;Monitor prescription use compliance.    Expected Outcomes Short Term: Continued assessment and intervention until BP is < 140/53m HG in hypertensive participants. < 130/853mHG in hypertensive participants with diabetes, heart failure or chronic kidney disease.;Long Term: Maintenance of blood pressure at goal levels.             Core Components/Risk Factors/Patient Goals Review:   Goals and Risk Factor Review     Row Name 07/01/21 1628 07/30/21 0818 08/26/21 1739         Core Components/Risk Factors/Patient Goals Review   Personal Goals Review Weight Management/Obesity;Hypertension;Diabetes Weight Management/Obesity;Hypertension;Diabetes Weight Management/Obesity;Hypertension;Diabetes     Review NaDinaratarted exercise on 07/01/21. Harlynn's vital signs and CBG's were stable NaNaimaas been doing great with exercise. Rasha's vital signs and CBG's have been stable. NaMemphisas been doing great with exercise. Rosanna's vital signs  and CBG's have been stable. NaBrandelynas lost 2.3 kg since starting CR. Lelaina plans to discuss with her primary care provider JaDarleen CrockerNP. NaMirianill complete phase 2 cardiac rehab on 09/02/21     Expected Outcomes Lauran wil contibue to participate in phase 2 cardiac rehab for exercise, nutrition and lifestyle modifications NaAnnalyseil contibue to participate in phase 2 cardiac rehab for exercise, nutrition and lifestyle modifications NaIdonnail continue to exercise, follow  nutrition and lifestyle modifications upon completion of phase 2 cardiac rehab.              Core Components/Risk Factors/Patient Goals at Discharge (Final Review):   Goals and Risk Factor Review - 08/26/21 1739       Core Components/Risk Factors/Patient Goals Review   Personal Goals Review Weight Management/Obesity;Hypertension;Diabetes    Review NaLahnaas been doing great with exercise. Najae's vital signs and CBG's have been stable. NaPrudenceas lost 2.3 kg since starting CR. Raegen plans to discuss with her primary care provider JaDarleen CrockerNP. NaHayleeill complete phase 2 cardiac rehab on 09/02/21    Expected Outcomes NaMairlynil continue to exercise, follow  nutrition and lifestyle modifications upon completion of phase 2 cardiac rehab.             ITP Comments:  ITP Comments     Row Name 06/25/21 [id number]/10/23 1626 07/30/21 0816 08/26/21 1735     ITP Comments Medical Director- Dr. TrFransico HimMD 30 Day ITP Review. NaNekatarted exercise at cardiac rehab on 07/01/21 and did well with exercise. 30 Day ITP Review. NaCydneyas good attendance and participation in phase 2 cardiac rehab. 30 Day ITP Review. NaEniolaas good attendance and participation in phase 2 cardiac rehab. NaTaleyahill complete phase 2 cardiac rehab on 09/02/21             Comments: See ITP Comments

## 2021-08-27 ENCOUNTER — Ambulatory Visit (INDEPENDENT_AMBULATORY_CARE_PROVIDER_SITE_OTHER): Payer: Medicare Other | Admitting: Nurse Practitioner

## 2021-08-27 VITALS — BP 132/72 | HR 61 | Temp 98.1°F | Ht 63.2 in | Wt 119.0 lb

## 2021-08-27 DIAGNOSIS — I119 Hypertensive heart disease without heart failure: Secondary | ICD-10-CM

## 2021-08-27 DIAGNOSIS — Z79899 Other long term (current) drug therapy: Secondary | ICD-10-CM

## 2021-08-27 DIAGNOSIS — E1151 Type 2 diabetes mellitus with diabetic peripheral angiopathy without gangrene: Secondary | ICD-10-CM | POA: Diagnosis not present

## 2021-08-27 DIAGNOSIS — I739 Peripheral vascular disease, unspecified: Secondary | ICD-10-CM

## 2021-08-27 DIAGNOSIS — C50211 Malignant neoplasm of upper-inner quadrant of right female breast: Secondary | ICD-10-CM | POA: Diagnosis not present

## 2021-08-27 DIAGNOSIS — I10 Essential (primary) hypertension: Secondary | ICD-10-CM

## 2021-08-27 DIAGNOSIS — E119 Type 2 diabetes mellitus without complications: Secondary | ICD-10-CM

## 2021-08-27 DIAGNOSIS — E782 Mixed hyperlipidemia: Secondary | ICD-10-CM | POA: Diagnosis not present

## 2021-08-27 DIAGNOSIS — Z0000 Encounter for general adult medical examination without abnormal findings: Secondary | ICD-10-CM | POA: Diagnosis not present

## 2021-08-27 DIAGNOSIS — I25119 Atherosclerotic heart disease of native coronary artery with unspecified angina pectoris: Secondary | ICD-10-CM | POA: Diagnosis not present

## 2021-08-27 DIAGNOSIS — Z170 Estrogen receptor positive status [ER+]: Secondary | ICD-10-CM

## 2021-08-27 DIAGNOSIS — Z23 Encounter for immunization: Secondary | ICD-10-CM | POA: Diagnosis not present

## 2021-08-27 NOTE — Patient Instructions (Signed)

## 2021-08-27 NOTE — Progress Notes (Signed)
This visit occurred during the SARS-CoV-2 public health emergency.  Safety protocols were in place, including screening questions prior to the visit, additional usage of staff PPE, and extensive cleaning of exam room while observing appropriate contact time as indicated for disinfecting solutions.  Subjective:     Patient ID: Elizabeth Mcfarland , female    DOB: [birth year] , 81 y.o.   MRN: [id number]   Chief Complaint  Patient presents with   Annual Exam    HPI  Here for HM. She is finishing up her cardiac rehab.   Wt Readings from Last 3 Encounters: 08/27/21 : 119 lb (54 kg) 07/03/21 : 122 lb 9.2 oz (55.6 kg) 06/25/21 : 124 lb 12.5 oz (56.6 kg)    Hypertension This is a chronic problem. The current episode started more than 1 year ago. The problem is unchanged. Pertinent negatives include no chest pain, headaches or palpitations. Past treatments include ACE inhibitors. The current treatment provides significant improvement.  Diabetes She presents for her follow-up diabetic visit. She has type 2 diabetes mellitus. Her disease course has been stable. There are no hypoglycemic associated symptoms. Pertinent negatives for hypoglycemia include no dizziness or headaches. Pertinent negatives for diabetes include no chest pain, no fatigue, no polydipsia, no polyphagia and no polyuria. There are no hypoglycemic complications. Symptoms are stable. There are no diabetic complications. Risk factors for coronary artery disease include hypertension, sedentary lifestyle and dyslipidemia. Current diabetic treatment includes oral agent (dual therapy). She is compliant with treatment all of the time. Her weight is stable. She is following a generally healthy diet. When asked about meal planning, she reported none. She participates in exercise daily (gardening). (Blood sugars was 210 last night, she took her glyburide at bedtime and her blood sugar this morning was 92 after taking a 1/2 tablet) An ACE  inhibitor/angiotensin II receptor blocker is being taken. Eye exam is current.     Past Medical History:  Diagnosis Date   Allergy    Anemia    Blood transfusion without reported diagnosis    Breast cancer ([facility]) 11/2018   Right breast invasive mammary carcinoma   Cancer ([place])    Cataract    Chronic kidney disease    Diabetes mellitus without complication ([facility])    Esophageal ulcer with bleeding 05/05/2013   05/05/2013 EGD linear distal esophageal ulcer - cause of hematemesis prior to EGD    GERD (gastroesophageal reflux disease)    History of kidney stones    Hyperlipidemia    Hypertension    Kidney cysts    Personal history of radiation therapy    Sarcoidosis 1970     Family History  Problem Relation Age of Onset   Early death Mother    Stroke Father    Diabetes Sister    Kidney failure Sister    Colon polyps Sister    Stroke Brother    Colon polyps Brother    Cancer Brother 59       prostate cancer   Heart disease Brother    Emphysema Brother    Cancer Brother 71       prostate cancer   Asthma Brother    CAD Maternal Grandmother    Breast cancer Neg Hx    Colon cancer Neg Hx    Esophageal cancer Neg Hx    Rectal cancer Neg Hx    Stomach cancer Neg Hx      Current Outpatient Medications:    acetaminophen (TYLENOL) 500 MG tablet, Take  1,000 mg by mouth every 6 (six) hours as needed for mild pain or moderate pain., Disp: , Rfl:    amLODipine (NORVASC) 10 MG tablet, Take 1 tablet (10 mg total) by mouth daily., Disp: 90 tablet, Rfl: 1   anastrozole (ARIMIDEX) 1 MG tablet, TAKE 1 TABLET(1 MG) BY MOUTH DAILY, Disp: 30 tablet, Rfl: 3   Ascorbic Acid (VITAMIN C) 1000 MG tablet, Take 1,000 mg by mouth daily., Disp: , Rfl:    aspirin EC 81 MG tablet, Take 81 mg by mouth daily., Disp: , Rfl:    atorvastatin (LIPITOR) 80 MG tablet, Take 1 tablet (80 mg total) by mouth daily., Disp: 90 tablet, Rfl: 3   cholecalciferol (VITAMIN D) 1000 UNITS tablet, Take 1,000 Units by mouth  daily. , Disp: , Rfl:    clopidogrel (PLAVIX) 75 MG tablet, Take 1 tablet (75 mg total) by mouth daily with breakfast., Disp: 90 tablet, Rfl: 3   empagliflozin (JARDIANCE) 10 MG TABS tablet, Take 1 tablet (10 mg total) by mouth daily before breakfast., Disp: 30 tablet, Rfl: 3   FLUZONE HIGH-DOSE QUADRIVALENT 0.7 ML SUSY, , Disp: , Rfl:    glimepiride (AMARYL) 4 MG tablet, TAKE 1 TABLET BY MOUTH EVERY MORNING, Disp: 90 tablet, Rfl: 1   metoprolol succinate (TOPROL XL) 25 MG 24 hr tablet, Take 1/2 tablet (12.5 mg total) by mouth daily. (Patient not taking: Reported on 09/02/2021), Disp: 90 tablet, Rfl: 3   Multiple Vitamin (MULTIVITAMIN WITH MINERALS) TABS tablet, Take 1 tablet by mouth daily. , Disp: , Rfl:    nitroGLYCERIN (NITROSTAT) 0.4 MG SL tablet, Place 1 tablet (0.4 mg total) under the tongue every 5 (five) minutes as needed for chest pain. (Patient not taking: Reported on 08/30/2021), Disp: 25 tablet, Rfl: 1   olmesartan (BENICAR) 40 MG tablet, TAKE 1 TABLET(40 MG) BY MOUTH DAILY, Disp: 90 tablet, Rfl: 1   ONETOUCH ULTRA test strip, USE AS DIRECTED TWICE DAILY, Disp: 100 strip, Rfl: 3   pantoprazole (PROTONIX) 40 MG tablet, Take 1 tablet (40 mg total) by mouth daily. (Patient not taking: Reported on 09/02/2021), Disp: 90 tablet, Rfl: 3   Polyethyl Glycol-Propyl Glycol (SYSTANE OP), Place 1 drop into both eyes 2 (two) times daily., Disp: , Rfl:    polyethylene glycol (MIRALAX / GLYCOLAX) packet, Take 17 g by mouth daily. , Disp: , Rfl:    Allergies  Allergen Reactions   Codeine Nausea And Vomiting   Percocet [Oxycodone-Acetaminophen] Nausea And Vomiting      The patient states she is status post hysterectomy.   Negative for: breast discharge, breast lump(s), breast pain and breast self exam. Associated symptoms include abnormal vaginal bleeding. Pertinent negatives include abnormal bleeding (hematology), anxiety, decreased libido, depression, difficulty falling sleep, dyspareunia, history of  infertility, nocturia, sexual dysfunction, sleep disturbances, urinary incontinence, urinary urgency, vaginal discharge and vaginal itching. Diet regular. The patient states her exercise level is minimal - 3 times a week for rehab and gardening.    The patient's tobacco use is:  Social History   Tobacco Use  Smoking Status Never  Smokeless Tobacco Never  . She has been exposed to passive smoke. The patient's alcohol use is:  Social History   Substance and Sexual Activity  Alcohol Use No    Review of Systems  Constitutional: Negative.  Negative for fatigue.  HENT: Negative.    Eyes: Negative.   Respiratory: Negative.    Cardiovascular: Negative.  Negative for chest pain and palpitations.  Gastrointestinal: Negative.  Endocrine: Negative.  Negative for polydipsia, polyphagia and polyuria.  Genitourinary: Negative.   Musculoskeletal: Negative.   Skin: Negative.   Allergic/Immunologic: Negative.   Neurological: Negative.  Negative for dizziness and headaches.  Hematological: Negative.   Psychiatric/Behavioral: Negative.       Today's Vitals   08/27/21 0956  BP: 132/72  Pulse: 61  Temp: 98.1 F (36.7 C)  TempSrc: Oral  Weight: 119 lb (54 kg)  Height: 5' 3.2" (1.605 m)   Body mass index is 20.95 kg/m.   Objective:  Physical Exam Vitals reviewed.  Constitutional:      General: She is not in acute distress.    Appearance: Normal appearance. She is well-developed. She is obese.  HENT:     Head: Normocephalic and atraumatic.     Right Ear: Hearing, tympanic membrane, ear canal and external ear normal. There is no impacted cerumen.     Left Ear: Hearing, tympanic membrane, ear canal and external ear normal. There is no impacted cerumen.     Nose:     Comments: Deferred - masked    Mouth/Throat:     Comments: Deferred - masked Eyes:     General: Lids are normal.     Extraocular Movements: Extraocular movements intact.     Conjunctiva/sclera: Conjunctivae normal.      Pupils: Pupils are equal, round, and reactive to light.     Funduscopic exam:    Right eye: No papilledema.        Left eye: No papilledema.  Neck:     Thyroid: No thyroid mass.     Vascular: No carotid bruit.  Cardiovascular:     Rate and Rhythm: Normal rate and regular rhythm.     Pulses: Normal pulses.     Heart sounds: Normal heart sounds. No murmur heard. Pulmonary:     Effort: Pulmonary effort is normal.     Breath sounds: Normal breath sounds.  Chest:     Chest wall: No mass.  Breasts:    Tanner Score is 5.     Right: Normal. No mass or tenderness.     Left: Normal. No mass or tenderness.  Abdominal:     General: Abdomen is flat. Bowel sounds are normal. There is no distension.     Palpations: Abdomen is soft.     Tenderness: There is no abdominal tenderness.  Genitourinary:    Rectum: Guaiac result negative.  Musculoskeletal:        General: No swelling. Normal range of motion.     Cervical back: Full passive range of motion without pain, normal range of motion and neck supple.     Right lower leg: No edema.     Left lower leg: No edema.  Lymphadenopathy:     Upper Body:     Right upper body: No supraclavicular, axillary or pectoral adenopathy.     Left upper body: No supraclavicular, axillary or pectoral adenopathy.  Skin:    General: Skin is warm and dry.     Capillary Refill: Capillary refill takes less than 2 seconds.  Neurological:     General: No focal deficit present.     Mental Status: She is alert and oriented to person, place, and time.     Cranial Nerves: No cranial nerve deficit.     Sensory: No sensory deficit.  Psychiatric:        Mood and Affect: Mood normal.        Behavior: Behavior normal.  Thought Content: Thought content normal.        Judgment: Judgment normal.         Assessment And Plan:     1. Encounter for health maintenance examination in adult Behavior modifications discussed and diet history reviewed.   Pt will  continue to exercise regularly and modify diet with low GI, plant based foods and decrease intake of processed foods.  Recommend intake of daily multivitamin, Vitamin D, and calcium.  Recommend mammogram (up to date) for preventive screenings, as well as recommend immunizations that include influenza, TDAP, and Shingles  2. Encounter for immunization - Varicella-zoster vaccine IM (Shingrix)  3. Essential hypertension Comments: Blood pressure is controlled, continue current medications.  - Microalbumin / creatinine urine ratio - CMP14+EGFR  4. Type 2 diabetes mellitus with diabetic peripheral angiopathy without gangrene, without long-term current use of insulin (HCC) Comments: Fair control, continue Jardiance. Encouraged to eat diet low in sugar and starches. Increase activity as tolerated - Microalbumin / creatinine urine ratio - Hemoglobin A1c - CMP14+EGFR  5. Mixed hyperlipidemia - Microalbumin / creatinine urine ratio - CMP14+EGFR - Lipid panel  6. Other long term (current) drug therapy - CBC  7. PAD (peripheral artery disease) (HCC) Comments: Continue blood thinner.   8. Malignant neoplasm of upper-inner quadrant of right breast in female, estrogen receptor positive ([place]) Comments: Continue follow up with Oncology  9. Atherosclerosis of native coronary artery of native heart with angina pectoris ([place]) Comments: Continue statin, tolerating well.    Patient was given opportunity to ask questions. Patient verbalized understanding of the plan and was able to repeat key elements of the plan. All questions were answered to their satisfaction.   [name], FNP   I, [name], FNP, have reviewed all documentation for this visit. The documentation on 08/27/21 for the exam, diagnosis, procedures, and orders are all accurate and complete.   THE PATIENT IS ENCOURAGED TO PRACTICE SOCIAL DISTANCING DUE TO THE COVID-19 PANDEMIC.

## 2021-08-28 ENCOUNTER — Encounter (HOSPITAL_COMMUNITY)
Admission: RE | Admit: 2021-08-28 | Discharge: 2021-08-28 | Disposition: A | Discharge status: Home / Self Care | Payer: Medicare Other | Source: Ambulatory Visit | Attending: Cardiovascular Disease | Admitting: Cardiovascular Disease

## 2021-08-28 DIAGNOSIS — Z955 Presence of coronary angioplasty implant and graft: Secondary | ICD-10-CM

## 2021-08-28 LAB — CMP14+EGFR
ALT: 66 IU/L — ABNORMAL HIGH (ref 0–32)
AST: 45 IU/L — ABNORMAL HIGH (ref 0–40)
Albumin/Globulin Ratio: 2.1 (ref 1.2–2.2)
Albumin: 4.8 g/dL — ABNORMAL HIGH (ref 3.7–4.7)
Alkaline Phosphatase: 80 IU/L (ref 44–121)
BUN/Creatinine Ratio: 16 (ref 12–28)
BUN: 13 mg/dL (ref 8–27)
Bilirubin Total: 0.7 mg/dL (ref 0.0–1.2)
CO2: 25 mmol/L (ref 20–29)
Calcium: 9.8 mg/dL (ref 8.7–10.3)
Chloride: 102 mmol/L (ref 96–106)
Creatinine, Ser: 0.83 mg/dL (ref 0.57–1.00)
Globulin, Total: 2.3 g/dL (ref 1.5–4.5)
Glucose: 154 mg/dL — ABNORMAL HIGH (ref 70–99)
Potassium: 4.1 mmol/L (ref 3.5–5.2)
Sodium: 140 mmol/L (ref 134–144)
Total Protein: 7.1 g/dL (ref 6.0–8.5)
eGFR: 72 mL/min/{1.73_m2} (ref >59)

## 2021-08-28 LAB — CBC
Hematocrit: 39.8 % (ref 34.0–46.6)
Hemoglobin: 13.2 g/dL (ref 11.1–15.9)
MCH: 29.7 pg (ref 26.6–33.0)
MCHC: 33.2 g/dL (ref 31.5–35.7)
MCV: 90 fL (ref 79–97)
Platelets: 225 10*3/uL (ref 150–450)
RBC: 4.44 x10E6/uL (ref 3.77–5.28)
RDW: 12.1 % (ref 11.7–15.4)
WBC: 4.9 10*3/uL (ref 3.4–10.8)

## 2021-08-28 LAB — LIPID PANEL
Chol/HDL Ratio: 1.8 ratio (ref 0.0–4.4)
Cholesterol, Total: 173 mg/dL (ref 100–199)
HDL: 97 mg/dL (ref >39)
LDL Chol Calc (NIH): 68 mg/dL (ref 0–99)
Triglycerides: 34 mg/dL (ref 0–149)
VLDL Cholesterol Cal: 8 mg/dL (ref 5–40)

## 2021-08-28 LAB — MICROALBUMIN / CREATININE URINE RATIO
Creatinine, Urine: 28.9 mg/dL
Microalb/Creat Ratio: 11 mg/g creat (ref 0–29)
Microalbumin, Urine: 3.3 ug/mL

## 2021-08-28 LAB — HEMOGLOBIN A1C
Est. average glucose Bld gHb Est-mCnc: 154 mg/dL
Hgb A1c MFr Bld: 7 % — ABNORMAL HIGH (ref 4.8–5.6)

## 2021-08-29 DIAGNOSIS — C50411 Malignant neoplasm of upper-outer quadrant of right female breast: Secondary | ICD-10-CM | POA: Diagnosis not present

## 2021-08-29 DIAGNOSIS — Z170 Estrogen receptor positive status [ER+]: Secondary | ICD-10-CM | POA: Diagnosis not present

## 2021-08-30 ENCOUNTER — Ambulatory Visit: Payer: Medicare Other | Admitting: Cardiovascular Disease

## 2021-08-30 ENCOUNTER — Encounter: Payer: Self-pay | Admitting: Cardiovascular Disease

## 2021-08-30 ENCOUNTER — Encounter (HOSPITAL_COMMUNITY): Payer: Medicare Other

## 2021-08-30 ENCOUNTER — Ambulatory Visit (INDEPENDENT_AMBULATORY_CARE_PROVIDER_SITE_OTHER): Payer: Medicare Other | Admitting: Cardiovascular Disease

## 2021-08-30 VITALS — BP 162/80 | HR 64 | Ht 63.0 in | Wt 120.4 lb

## 2021-08-30 DIAGNOSIS — I2510 Atherosclerotic heart disease of native coronary artery without angina pectoris: Secondary | ICD-10-CM | POA: Diagnosis not present

## 2021-08-30 DIAGNOSIS — E7800 Pure hypercholesterolemia, unspecified: Secondary | ICD-10-CM | POA: Diagnosis not present

## 2021-08-30 DIAGNOSIS — I10 Essential (primary) hypertension: Secondary | ICD-10-CM

## 2021-08-30 DIAGNOSIS — Z853 Personal history of malignant neoplasm of breast: Secondary | ICD-10-CM | POA: Diagnosis not present

## 2021-08-30 DIAGNOSIS — E119 Type 2 diabetes mellitus without complications: Secondary | ICD-10-CM | POA: Diagnosis not present

## 2021-08-30 DIAGNOSIS — I4589 Other specified conduction disorders: Secondary | ICD-10-CM | POA: Diagnosis not present

## 2021-08-30 DIAGNOSIS — R634 Abnormal weight loss: Secondary | ICD-10-CM

## 2021-08-30 LAB — TSH: TSH: 1.04 u[IU]/mL (ref 0.450–4.500)

## 2021-08-30 NOTE — Progress Notes (Signed)
Cardiology Office Note:    Date:  08/30/2021   ID:  Elizabeth Mcfarland, DOB [birth year], MRN [id number]  PCP:  [name], [facility] Providers Cardiologist:  [name], MD     Referring MD: [name], FNP   Chief Complaint  Patient presents with   Coronary Artery Disease         History of Present Illness:    Elizabeth Mcfarland is a 81 y.o. female with a hx of CAD s/p PCI-DES of mid LAD in January 2023, well-controlled hypertension and type 2 diabetes mellitus, recent right breast lumpectomy and radiation therapy in 2020 (no chemotherapy), history of nephrolithiasis, remote history of distal esophageal ulcer causing hematemesis in 2015,.  Presentation was with shortness of breath but work-up led to diagnosis of a high-grade mid LAD stenosis.  She underwent placement of a stent in the LAD and the shortness of breath has improved.  She has been engaged in cardiac rehab where she has done quite well.  It is notable that in rehab her typical heart rate only reaches 55-75% of target, suggesting that she may have a component of chronotropic incompetence.  Blood pressure is very well controlled at cardiac rehab, although it is high in the office today.  Her typical blood pressure is in the 110-120s/60s-70s.  She denies dizziness palpitations or syncope.  She has not had any falls injuries or bleeding problems.  She has not had GI bleeding since 2015.  She denies lower extremity edema, orthopnea or PND.  Her biggest complaint is inability to gain back weight.  She is steadily had a decline in weight over the last few years and is now barely holding above a BMI of 21.  She reports that she eats well.  She is doing her good job avoiding sweets and starches and eating lots of vegetables and lean meat.  Her most recent hemoglobin A1c was 7% and it was 6.7% 2 months ago.  Her most recent LDL cholesterol is 70 and was 66, 2 months ago.  She has normal renal function.  Her coronary CT  angiogram showed a markedly elevated calcium score (93rd percentile for age and gender).  In addition there was single-vessel CAD with a high-grade stenosis in the proximal-mid LAD artery with a confirmatory reduced FFR of 0.71.  In 2017 she underwent an echocardiogram and a pharmacological nuclear myocardial perfusion test.  The perfusion images were normal.  LVEF was normal and there were no major valvular abnormalities.  Doppler measurements suggested "grade 1 diastolic dysfunction".  On a CT of the abdomen and pelvis about a year ago there was evidence of advanced calcification in the abdominal aorta and in the coronary arteries.  Labs performed in September showed a normal hemoglobin of 12.3, negative screening for autoimmune disorders, normal ESR at 2, normal renal and liver tests.  Her most recent lipid profile showed a total cholesterol 199, excellent HDL at 107, LDL 86 and triglycerides 29.  She has never smoked.  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  She is originally from [place], [place]  Past Medical History:  Diagnosis Date   Allergy    Anemia    Blood transfusion without reported diagnosis    Breast cancer ([place]) 11/2018   Right breast invasive mammary carcinoma   Cancer ([place])    Cataract    Chronic kidney disease    Diabetes mellitus without complication ([place])  Esophageal ulcer with bleeding 05/05/2013   05/05/2013 EGD linear distal esophageal ulcer - cause of hematemesis prior to EGD    GERD (gastroesophageal reflux disease)    History of kidney stones    Hyperlipidemia    Hypertension    Kidney cysts    Personal history of radiation therapy    Sarcoidosis 1970    Past Surgical History:  Procedure Laterality Date   ABDOMINAL HYSTERECTOMY     BREAST BIOPSY Left 07/09/2021   BREAST EXCISIONAL BIOPSY Left 11/2018   Fibroadenoma   BREAST LUMPECTOMY     BREAST LUMPECTOMY WITH RADIOACTIVE SEED AND SENTINEL LYMPH NODE BIOPSY  Bilateral 11/25/2018   Procedure: BILATERAL BREAST LUMPECTOMIES  WITH BILATERAL  RADIOACTIVE SEEDS AND RIGHT SENTINEL LYMPH NODE BIOPSY;  Surgeon: [name], MD;  Location: [facility];  Service: General;  Laterality: Bilateral;   CATARACT EXTRACTION W/ INTRAOCULAR LENS IMPLANT     COLONOSCOPY     COLONOSCOPY, ESOPHAGOGASTRODUODENOSCOPY (EGD) AND ESOPHAGEAL DILATION N/A 05/05/2013   Procedure: COLONOSCOPY, ESOPHAGOGASTRODUODENOSCOPY (EGD) AND ESOPHAGEAL DILATION (ED);  Surgeon: Gatha Mayer, MD;  Location: [facility];  Service: Endoscopy;  Laterality: N/A;   LEFT HEART CATH AND CORONARY ANGIOGRAPHY N/A 04/08/2021   Procedure: LEFT HEART CATH AND CORONARY ANGIOGRAPHY;  Surgeon: Sherren Mocha, MD;  Location: [facility];  Service: Cardiovascular;  Laterality: N/A;   LITHOTRIPSY     POLYPECTOMY     UPPER GASTROINTESTINAL ENDOSCOPY      Current Medications: Current Meds  Medication Sig   amLODipine (NORVASC) 10 MG tablet TAKE 1 TABLET BY MOUTH EVERY DAY (Patient taking differently: Take 10 mg by mouth at bedtime.)   anastrozole (ARIMIDEX) 1 MG tablet TAKE 1 TABLET(1 MG) BY MOUTH DAILY   Ascorbic Acid (VITAMIN C) 1000 MG tablet Take 1,000 mg by mouth daily.   aspirin EC 81 MG tablet Take 81 mg by mouth daily.   atorvastatin (LIPITOR) 80 MG tablet Take 1 tablet (80 mg total) by mouth daily.   cholecalciferol (VITAMIN D) 1000 UNITS tablet Take 1,000 Units by mouth daily.    clopidogrel (PLAVIX) 75 MG tablet Take 1 tablet (75 mg total) by mouth daily with breakfast.   empagliflozin (JARDIANCE) 10 MG TABS tablet Take 1 tablet (10 mg total) by mouth daily before breakfast.   glimepiride (AMARYL) 4 MG tablet TAKE 1 TABLET BY MOUTH EVERY MORNING   metoprolol succinate (TOPROL XL) 25 MG 24 hr tablet Take 1/2 tablet (12.5 mg total) by mouth daily.   Multiple Vitamin (MULTIVITAMIN WITH MINERALS) TABS tablet Take 1 tablet by mouth daily.    olmesartan (BENICAR) 40 MG tablet TAKE 1 TABLET(40  MG) BY MOUTH DAILY   ONETOUCH ULTRA test strip USE AS DIRECTED TWICE DAILY   pantoprazole (PROTONIX) 40 MG tablet Take 1 tablet (40 mg total) by mouth daily.   Polyethyl Glycol-Propyl Glycol (SYSTANE OP) Place 1 drop into both eyes 2 (two) times daily.   polyethylene glycol (MIRALAX / GLYCOLAX) packet Take 17 g by mouth daily.      Allergies:   Codeine and Percocet [oxycodone-acetaminophen]   Social History   Socioeconomic History   Marital status: Single    Spouse name: Not on file   Number of children: 1   Years of education: Not on file   Highest education level: GED or equivalent  Occupational History   Occupation: Physiological scientist: SELF-EMPLOYED   Occupation: retired  Tobacco Use   Smoking status: Never   Smokeless  tobacco: Never  Vaping Use   Vaping Use: Never used  Substance and Sexual Activity   Alcohol use: No   Drug use: No   Sexual activity: Not Currently  Other Topics Concern   Not on file  Social History Narrative   Lives alone   Social Determinants of Health   Financial Resource Strain: Low Risk  (06/12/2021)   Overall Financial Resource Strain (CARDIA)    Difficulty of Paying Living Expenses: Not hard at all  Food Insecurity: No Food Insecurity (06/12/2021)   Hunger Vital Sign    Worried About Running Out of Food in the Last Year: Never true    Ran Out of Food in the Last Year: Never true  Transportation Needs: No Transportation Needs (06/12/2021)   PRAPARE - Hydrologist (Medical): No    Lack of Transportation (Non-Medical): No  Physical Activity: Insufficiently Active (06/12/2021)   Exercise Vital Sign    Days of Exercise per Week: 1 day    Minutes of Exercise per Session: 60 min  Stress: No Stress Concern Present (06/12/2021)   [place]    Feeling of Stress : Not at all  Social Connections: Not on file     Family History: The patient's family  history includes Asthma in her brother; CAD in her maternal grandmother; Cancer (age of onset: 41) in her brother and brother; Colon polyps in her brother and sister; Diabetes in her sister; Early death in her mother; Emphysema in her brother; Heart disease in her brother; Kidney failure in her sister; Stroke in her brother and father. There is no history of Breast cancer, Colon cancer, Esophageal cancer, Rectal cancer, or Stomach cancer.  ROS:   Please see the history of present illness.     All other systems reviewed and are negative.  EKGs/Labs/Other Studies Reviewed:    The following studies were reviewed today:  Cardiac catheterization 04/08/2021    Mid LAD lesion is 75% stenosed.   A drug-eluting stent was successfully placed using a STENT ONYX FRONTIER 3.0X15.   Post intervention, there is a 0% residual stenosis.   The left ventricular systolic function is normal.   LV end diastolic pressure is normal.   1.  Severe single-vessel coronary artery disease with successful stenting of a 75% eccentric stenosis in the mid LAD treated with a 3.0 x 15 mm resolute Onyx DES, reducing the stenosis to 0%.  TIMI-3 flow at baseline and post PCI. 2.  Patent left main, left circumflex, and RCA without significant stenoses 3.  Low/normal LVEDP   Coronary CT angiography 03/26/2021 Coronary Calcium Score:   Left main: 209   Left anterior descending artery: 265   Left circumflex artery: 146   Right coronary artery: 73   Ramus Intermedius: 61   Total: 754   Percentile: 93rd for age, sex, and race matched control.   RCA is a large dominant artery that gives rise to PDA and PLA. Mild calcified plaques in the proximal and mid vessel.   Left main is a large artery that gives rise to LAD, RI, and LCX arteries. Mild ostial mixed plaque with mild distal calcified plaque.   LAD is a large vessel that gives rise to one large D1 Branch. Severe mixed plaque in the proximal-mid LAD. Mild  calcified plaque in the LAD at the D1 takeoff. Mild calcified plaque in the mid D1 branch. Small myocardial bridge distal LAD.   LCX  is a non-dominant artery. Mild calcified plaques in the proximal and mid vessel.   There is a ramus intermedius vessel. Mild calcified plaques in the mid vessel.   Other findings:   Normal pulmonary vein drainage into the left atrium.   Normal left atrial appendage without a thrombus.   Small left atrial diverticulum.   Small interatrial vestigial remnant- normal variant.   Extra-cardiac findings: See attached radiology report for non-cardiac structures.   Cardiac Motion- Slab artifact noted.   IMPRESSION: 1. Coronary calcium score of 754. This was 93rd percentile for age, sex, and race matched control.   2. Normal coronary origin with right dominance.   01/04/2016 nuclear stress test Nuclear stress EF: 67%. There was no ST segment deviation noted during stress. The study is normal. This is a low risk study.   Low risk stress nuclear study with normal perfusion and normal left ventricular regional and global systolic function.  01/04/2016 echocardiogram - Left ventricle: The cavity size was normal. Wall thickness was    normal. Systolic function was vigorous. The estimated ejection    fraction was in the range of 65% to 70%. Wall motion was normal;    there were no regional wall motion abnormalities. Doppler    parameters are consistent with abnormal left ventricular    relaxation (grade 1 diastolic dysfunction).     EKG:  EKG is not ordered today.  The ekg ordered 04/09/2021 is personally reviewed and demonstrates normal sinus rhythm, very mild nonspecific T wave inversion in the inferior and lateral precordial leads  Recent Labs: 08/27/2021: ALT 66; BUN 13; Creatinine, Ser 0.83; Hemoglobin 13.2; Platelets 225; Potassium 4.1; Sodium 140  Recent Lipid Panel    Component Value Date/Time   CHOL 173 08/27/2021 1049   TRIG 34 08/27/2021  1049   HDL 97 08/27/2021 1049   CHOLHDL 1.8 08/27/2021 1049   CHOLHDL 2.8 12/24/2015 0306   VLDL 12 12/24/2015 0306   LDLCALC 68 08/27/2021 1049     Risk Assessment/Calculations:           Physical Exam:    VS:  BP (!) 162/80 (BP Location: Left Arm, Patient Position: Sitting, Cuff Size: Small) Comment (Cuff Size): Small adult  Pulse 64   Ht '5\' 3"'  (1.6 m)   Wt 120 lb 6.4 oz (54.6 kg)   SpO2 98%   BMI 21.33 kg/m     Wt Readings from Last 3 Encounters:  08/30/21 120 lb 6.4 oz (54.6 kg)  08/27/21 119 lb (54 kg)  07/03/21 122 lb 9.2 oz (55.6 kg)      General: Alert, oriented x3, no distress, very slender. Head: no evidence of trauma, PERRL, EOMI, no exophtalmos or lid lag, no myxedema, no xanthelasma; normal ears, nose and oropharynx Neck: normal jugular venous pulsations and no hepatojugular reflux; brisk carotid pulses without delay and no carotid bruits Chest: clear to auscultation, no signs of consolidation by percussion or palpation, normal fremitus, symmetrical and full respiratory excursions Cardiovascular: normal position and quality of the apical impulse, regular rhythm, normal first and second heart sounds, no murmurs, rubs or gallops Abdomen: no tenderness or distention, no masses by palpation, no abnormal pulsatility or arterial bruits, normal bowel sounds, no hepatosplenomegaly Extremities: no clubbing, cyanosis or edema; 2+ radial, ulnar and brachial pulses bilaterally; 2+ right femoral, posterior tibial and dorsalis pedis pulses; 2+ left femoral, posterior tibial and dorsalis pedis pulses; no subclavian or femoral bruits Neurological: grossly nonfocal Psych: Normal mood and affect  ASSESSMENT:    1. Coronary artery disease involving native coronary artery of native heart without angina pectoris   2. Diabetes mellitus type 2 in nonobese (HCC)   3. Essential hypertension   4. Chronotropic incompetence   5. Hypercholesterolemia   6. History of breast cancer    7. Weight loss      PLAN:    In order of problems listed above:  Coronary artery disease: Improved exertional capacity after stent to the proximal-mid LAD in January.  She never had chest pain, but rather exertional dyspnea.  She had normal LVEDP at the time of cardiac catheterization. Possible chronotropic incompetence as a cause of her exertional dyspnea and fatigue.  Currently complains seem to be better.  Consider 72-hour Holter monitor in the future. HTN: Usually with excellent control.  Although her blood pressure is high today, multiple readings from a cardiac rehab show excellent blood pressure control. DM: Good control. HLP: Despite an excellent HDL cholesterol, she has substantial CAD burden.  LDL at target less after increasing the dose of atorvastatin 80 mg daily. History of breast cancer status post XRT Weight loss: She is doing an excellent job monitoring her diet.  May be excessively so.  We will check a TSH to make sure hyperthyroidism is not why she is losing weight.           Medication Adjustments/Labs and Tests Ordered: Current medicines are reviewed at length with the patient today.  Concerns regarding medicines are outlined above.  Orders Placed This Encounter  Procedures   TSH   No orders of the defined types were placed in this encounter.   Patient Instructions  Medication Instructions:  No changes *If you need a refill on your cardiac medications before your next appointment, please call your pharmacy*   Lab Work: Your provider would like for you to have the following labs today: TSH  If you have labs (blood work) drawn today and your tests are completely normal, you will receive your results only by: [place] (if you have MyChart) OR A paper copy in the mail If you have any lab test that is abnormal or we need to change your treatment, we will call you to review the results.   Testing/Procedures: None ordered   Follow-Up: At [facility], you and your health needs are our priority.  As part of our continuing mission to provide you with exceptional heart care, we have created designated Provider Care Teams.  These Care Teams include your primary Cardiologist (physician) and Advanced Practice Providers (APPs -  Physician Assistants and Nurse Practitioners) who all work together to provide you with the care you need, when you need it.  We recommend signing up for the patient portal called "MyChart".  Sign up information is provided on this After Visit Summary.  MyChart is used to connect with patients for Virtual Visits (Telemedicine).  Patients are able to view lab/test results, encounter notes, upcoming appointments, etc.  Non-urgent messages can be sent to your provider as well.   To learn more about what you can do with MyChart, go to [email or web address].    Your next appointment:   6 month(s)  The format for your next appointment:   In Person  Provider:   [name], MD {    Important Information About Sugar         Signed, [name], MD  08/30/2021 11:34 AM    [place]

## 2021-08-30 NOTE — Patient Instructions (Signed)
Medication Instructions:  No changes *If you need a refill on your cardiac medications before your next appointment, please call your pharmacy*   Lab Work: Your provider would like for you to have the following labs today: TSH  If you have labs (blood work) drawn today and your tests are completely normal, you will receive your results only by: [place] (if you have MyChart) OR A paper copy in the mail If you have any lab test that is abnormal or we need to change your treatment, we will call you to review the results.   Testing/Procedures: None ordered   Follow-Up: At [facility], you and your health needs are our priority.  As part of our continuing mission to provide you with exceptional heart care, we have created designated Provider Care Teams.  These Care Teams include your primary Cardiologist (physician) and Advanced Practice Providers (APPs -  Physician Assistants and Nurse Practitioners) who all work together to provide you with the care you need, when you need it.  We recommend signing up for the patient portal called "MyChart".  Sign up information is provided on this After Visit Summary.  MyChart is used to connect with patients for Virtual Visits (Telemedicine).  Patients are able to view lab/test results, encounter notes, upcoming appointments, etc.  Non-urgent messages can be sent to your provider as well.   To learn more about what you can do with MyChart, go to [email or web address].    Your next appointment:   6 month(s)  The format for your next appointment:   In Person  Provider:   [name], MD {    Important Information About Sugar

## 2021-08-31 ENCOUNTER — Other Ambulatory Visit: Payer: Self-pay | Admitting: Hematology

## 2021-09-02 ENCOUNTER — Other Ambulatory Visit: Payer: Self-pay

## 2021-09-02 ENCOUNTER — Encounter (HOSPITAL_COMMUNITY)
Admission: RE | Admit: 2021-09-02 | Discharge: 2021-09-02 | Disposition: A | Discharge status: Home / Self Care | Payer: Medicare Other | Source: Ambulatory Visit | Attending: Cardiovascular Disease | Admitting: Cardiovascular Disease

## 2021-09-02 ENCOUNTER — Telehealth: Payer: Self-pay | Admitting: Cardiovascular Disease

## 2021-09-02 VITALS — BP 124/50 | HR 69 | Ht 63.5 in | Wt 118.4 lb

## 2021-09-02 DIAGNOSIS — Z955 Presence of coronary angioplasty implant and graft: Secondary | ICD-10-CM

## 2021-09-02 DIAGNOSIS — E1169 Type 2 diabetes mellitus with other specified complication: Secondary | ICD-10-CM

## 2021-09-02 MED ORDER — GLIMEPIRIDE 4 MG PO TABS: ORAL_TABLET | ORAL | 1 refills | Status: DC

## 2021-09-02 MED ORDER — AMLODIPINE BESYLATE 10 MG PO TABS: 10.0000 mg | ORAL_TABLET | Freq: Every day | ORAL | 1 refills | Status: DC

## 2021-09-02 NOTE — Telephone Encounter (Signed)
Pt c/o medication issue:  1. Name of Medication: metoprolol succinate (TOPROL XL) 25 MG 24 hr tablet  2. How are you currently taking this medication (dosage and times per day)? Take 1/2 tablet (12.5 mg total) by mouth daily.Patient not taking: Reported on 09/02/2021  3. Are you having a reaction (difficulty breathing--STAT)? no  4. What is your medication issue? Going to see if patient suppose to stop taking the medication. Please advise    Call patient to let her know

## 2021-09-02 NOTE — Telephone Encounter (Signed)
Spoke with pt regarding medications. Pt states that she was told at her last office visit (6/9) that she could stop her metoprolol and was told that she could complete her pantoprazole and stop that. Explained to pt that I do not see where his note states either of these changes. Pt states that she and Dr. Loletha Grayer had a long conversation and he possibly forgot to put these changes in his note. Will route to provider to clarify.

## 2021-09-02 NOTE — Progress Notes (Addendum)
Discharge Progress Report  Patient Details  Name: Elizabeth Mcfarland MRN: [id number] Date of Birth: Dec 20, 1940 Referring Provider:   Flowsheet Row CARDIAC REHAB PHASE II ORIENTATION from 06/25/2021 in [place]  Referring Provider [name], [name], MD        Number of Visits: 26  Reason for Discharge:  Patient reached a stable level of exercise. Patient independent in their exercise. Patient has met program and personal goals.  Smoking History:  Social History   Tobacco Use  Smoking Status Never  Smokeless Tobacco Never    Diagnosis:  04/08/21 DES LAD  ADL UCSD:   Initial Exercise Prescription:  Initial Exercise Prescription - 06/25/21 1000       Date of Initial Exercise RX and Referring Provider   Date 06/25/21    Referring Provider [name], [name], MD    Expected Discharge Date 08/23/21      Bike   Level 1.5    Minutes 15    METs 2.2      NuStep   Level 2    SPM 85    Minutes 15    METs 2.2      Prescription Details   Frequency (times per week) 3    Duration Progress to 30 minutes of continuous aerobic without signs/symptoms of physical distress      Intensity   THRR 40-80% of Max Heartrate 57-114    Ratings of Perceived Exertion 11-13    Perceived Dyspnea 0-4      Progression   Progression Continue to progress workloads to maintain intensity without signs/symptoms of physical distress.      Resistance Training   Training Prescription Yes    Weight 2 lbs.    Reps 10-15             Discharge Exercise Prescription (Final Exercise Prescription Changes):  Exercise Prescription Changes - 08/26/21 1027       Response to Exercise   Blood Pressure (Admit) 138/70    Blood Pressure (Exercise) 158/60    Blood Pressure (Exit) 120/60    Heart Rate (Admit) 63 bpm    Heart Rate (Exercise) 96 bpm    Heart Rate (Exit) 59 bpm    Rating of Perceived Exertion (Exercise) 11    Symptoms None    Duration Continue with 30 min  of aerobic exercise without signs/symptoms of physical distress.    Intensity THRR unchanged      Progression   Progression Continue to progress workloads to maintain intensity without signs/symptoms of physical distress.    Average METs 3.1      Resistance Training   Training Prescription Yes    Weight 3 lbs    Reps 10-15    Time 10 Minutes      Interval Training   Interval Training No      Bike   Level 2    Minutes 15    METs 4.2      NuStep   Level 2    SPM 85    Minutes 15    METs 2      Home Exercise Plan   Plans to continue exercise at Home (comment)   Stationary bike   Frequency Add 3 additional days to program exercise sessions.    Initial Home Exercises Provided 07/26/21             Functional Capacity:  6 Minute Walk     Row Name 06/25/21 [phone] 08/28/21 [phone]  6 Minute Walk   Phase Initial Discharge    Distance 1269 feet 1255 feet    Distance % Change -- 1.1 %    Distance Feet Change -- 14 ft    Walk Time 6 minutes 6 minutes    # of Rest Breaks 2 0    MPH 2.4 2.38    METS 2.64 2.56    RPE 13 12    Perceived Dyspnea  1 0    VO2 Peak 9.26 8.96    Symptoms Yes (comment) Yes (comment)    Comments Patient c/o tiredness, tightness across her back, mild SOB, RPD= 1. Patient having back discomfort today. Had to use the rollator just past the halfway point of the test.    Resting HR 65 bpm 60 bpm    Resting BP 144/60 154/62    Resting Oxygen Saturation  99 % --    Exercise Oxygen Saturation  during 6 min walk 99 % 97 %    Max Ex. HR 98 bpm 91 bpm    Max Ex. BP 134/54 138/58    2 Minute Post BP 138/60 100/50             Psychological, QOL, Others - Outcomes: PHQ 2/9:    09/02/2021   11:49 AM 06/25/2021    9:54 AM 06/12/2021   10:30 AM 08/02/2020    2:11 PM 07/19/2020    9:32 AM  Depression screen PHQ 2/9  Decreased Interest 0 0 0 0 0  Down, Depressed, Hopeless 0 0 0 0 0  PHQ - 2 Score 0 0 0 0 0  Altered sleeping  0     Tired,  decreased energy  0     Change in appetite  0     Feeling bad or failure about yourself   0     Trouble concentrating  0     Moving slowly or fidgety/restless  0     Suicidal thoughts  0     PHQ-9 Score  0     Difficult doing work/chores  Not difficult at all       Quality of Life:  Quality of Life - 08/28/21 1702       Quality of Life   Select Quality of Life      Quality of Life Scores   Health/Function Pre 27.93 %    Health/Function Post 28.29 %    Health/Function % Change 1.29 %    Socioeconomic Pre 27 %    Socioeconomic Post 30 %    Socioeconomic % Change  11.11 %    Psych/Spiritual Pre 28.8 %    Psych/Spiritual Post 29.14 %    Psych/Spiritual % Change 1.18 %    Family Pre 28.5 %    Family Post 28.5 %    Family % Change 0 %    GLOBAL Pre 28 %    GLOBAL Post 28.84 %    GLOBAL % Change 3 %             Personal Goals: Goals established at orientation with interventions provided to work toward goal.  Personal Goals and Risk Factors at Admission - 06/25/21 0840       Core Components/Risk Factors/Patient Goals on Admission    Weight Management Weight Gain;Yes    Intervention Weight Management: Provide education and appropriate resources to help participant work on and attain dietary goals.;Weight Management: Develop a combined nutrition and exercise program designed to reach desired caloric intake, while maintaining  appropriate intake of nutrient and fiber, sodium and fats, and appropriate energy expenditure required for the weight goal.    Admit Weight 124 lb 12.5 oz (56.6 kg)    Goal Weight: Short Term 130 lb (59 kg)    Goal Weight: Long Term 135 lb (61.2 kg)    Expected Outcomes Short Term: Continue to assess and modify interventions until short term weight is achieved;Long Term: Adherence to nutrition and physical activity/exercise program aimed toward attainment of established weight goal;Weight Gain: Understanding of general recommendations for a high calorie,  high protein meal plan that promotes weight gain by distributing calorie intake throughout the day with the consumption for 4-5 meals, snacks, and/or supplements    Diabetes Yes    Intervention Provide education about signs/symptoms and action to take for hypo/hyperglycemia.;Provide education about proper nutrition, including hydration, and aerobic/resistive exercise prescription along with prescribed medications to achieve blood glucose in normal ranges: Fasting glucose 65-99 mg/dL    Expected Outcomes Short Term: Participant verbalizes understanding of the signs/symptoms and immediate care of hyper/hypoglycemia, proper foot care and importance of medication, aerobic/resistive exercise and nutrition plan for blood glucose control.;Long Term: Attainment of HbA1C < 7%.    Hypertension Yes    Intervention Provide education on lifestyle modifcations including regular physical activity/exercise, weight management, moderate sodium restriction and increased consumption of fresh fruit, vegetables, and low fat dairy, alcohol moderation, and smoking cessation.;Monitor prescription use compliance.    Expected Outcomes Short Term: Continued assessment and intervention until BP is < 140/49m HG in hypertensive participants. < 130/866mHG in hypertensive participants with diabetes, heart failure or chronic kidney disease.;Long Term: Maintenance of blood pressure at goal levels.              Personal Goals Discharge:  Goals and Risk Factor Review     Row Name 07/01/21 1628 07/30/21 0818 08/26/21 1739         Core Components/Risk Factors/Patient Goals Review   Personal Goals Review Weight Management/Obesity;Hypertension;Diabetes Weight Management/Obesity;Hypertension;Diabetes Weight Management/Obesity;Hypertension;Diabetes     Review NaNinoshkatarted exercise on 07/01/21. Elizabeth Mcfarland's vital signs and CBG's were stable NaJabreeas been doing great with exercise. Elizabeth Mcfarland's vital signs and CBG's have been stable. Elizabeth Mcfarland  been doing great with exercise. Elizabeth Mcfarland's vital signs and CBG's have been stable. Elizabeth Mcfarland lost 2.3 kg since starting CR. Elizabeth Mcfarland plans to discuss with her primary care provider Elizabeth Mcfarland. Elizabeth Mcfarland complete phase 2 cardiac rehab on 09/02/21     Expected Outcomes Jaycey wil contibue to participate in phase 2 cardiac rehab for exercise, nutrition and lifestyle modifications NaJaquettail contibue to participate in phase 2 cardiac rehab for exercise, nutrition and lifestyle modifications Elizabeth Mcfarland continue to exercise, follow  nutrition and lifestyle modifications upon completion of phase 2 cardiac rehab.              Exercise Goals and Review:  Exercise Goals     Row Name 06/25/21 0838             Exercise Goals   Increase Physical Activity Yes       Intervention Provide advice, education, support and counseling about physical activity/exercise needs.;Develop an individualized exercise prescription for aerobic and resistive training based on initial evaluation findings, risk stratification, comorbidities and participant's personal goals.       Expected Outcomes Short Term: Attend rehab on a regular basis to increase amount of physical activity.;Long Term: Exercising regularly at least 3-5 days a week.;Long Term: Add in home exercise  to make exercise part of routine and to increase amount of physical activity.       Increase Strength and Stamina Yes       Intervention Provide advice, education, support and counseling about physical activity/exercise needs.;Develop an individualized exercise prescription for aerobic and resistive training based on initial evaluation findings, risk stratification, comorbidities and participant's personal goals.       Expected Outcomes Short Term: Increase workloads from initial exercise prescription for resistance, speed, and METs.;Short Term: Perform resistance training exercises routinely during rehab and add in resistance training at home;Long Term: Improve  cardiorespiratory fitness, muscular endurance and strength as measured by increased METs and functional capacity (6MWT)       Able to understand and use rate of perceived exertion (RPE) scale Yes       Intervention Provide education and explanation on how to use RPE scale       Expected Outcomes Short Term: Able to use RPE daily in rehab to express subjective intensity level;Long Term:  Able to use RPE to guide intensity level when exercising independently       Knowledge and understanding of Target Heart Rate Range (THRR) Yes       Intervention Provide education and explanation of THRR including how the numbers were predicted and where they are located for reference       Expected Outcomes Short Term: Able to state/look up THRR;Long Term: Able to use THRR to govern intensity when exercising independently;Short Term: Able to use daily as guideline for intensity in rehab       Able to check pulse independently Yes       Intervention Provide education and demonstration on how to check pulse in carotid and radial arteries.;Review the importance of being able to check your own pulse for safety during independent exercise       Expected Outcomes Short Term: Able to explain why pulse checking is important during independent exercise;Long Term: Able to check pulse independently and accurately       Understanding of Exercise Prescription Yes       Intervention Provide education, explanation, and written materials on patient's individual exercise prescription       Expected Outcomes Short Term: Able to explain program exercise prescription;Long Term: Able to explain home exercise prescription to exercise independently                Exercise Goals Re-Evaluation:  Exercise Goals Re-Evaluation     Row Name 07/01/21 1138 07/26/21 1049 08/12/21 1113 08/26/21 1016       Exercise Goal Re-Evaluation   Exercise Goals Review Increase Physical Activity;Able to understand and use rate of perceived exertion  (RPE) scale Increase Physical Activity;Able to understand and use rate of perceived exertion (RPE) scale;Increase Strength and Stamina;Knowledge and understanding of Target Heart Rate Range (THRR);Able to check pulse independently;Understanding of Exercise Prescription Increase Physical Activity;Able to understand and use rate of perceived exertion (RPE) scale;Increase Strength and Stamina;Knowledge and understanding of Target Heart Rate Range (THRR);Able to check pulse independently;Understanding of Exercise Prescription Increase Physical Activity;Able to understand and use rate of perceived exertion (RPE) scale;Increase Strength and Stamina;Knowledge and understanding of Target Heart Rate Range (THRR);Able to check pulse independently;Understanding of Exercise Prescription    Comments Patient able to understand and use RPE scale appropriately. Patient is currently riding her stationary bike 15 minutes, 2-3 days/week as her mode of home exercise. Sometimes patient is riding her bike on the same days she attends cardiac rehab. I suggested  patient increase duration with a goal of achieving 30 minutes 2 days that she doesn't attend caridac rehab, and patient is agreeable to this. Patient previously exercised at [facility], participating in [place] 60 minutes once/week and plans to resume upon completion of the cardiac rehab program. Patient knows how to manually count her pulse. Patient is also active gardening. Patient continues to progress well with exericse. Patient is riding her stationary bike at home 2 days/week and is doing yard work without any issues. Patient continue to express concern about weight loss. Patient is working with dietitian. Increased hand weights today from 2 to 3 lbs to help build muscle/increase strength and tolerated well. Patient is scheduled to complete the cardiac rehab program soon and is progressing well. [facility] and riding her  stationary bike at home at least 3 days/week, 30-60 minutes.    Expected Outcomes Progress workloads as tolerated to help increase cardiorespiratory fitness. Patient will increase exercise duration on her bike at home from 15 to 30 minutes. Patient will exercise 2 days/week at home on days she doesn't attend cardiac rehab. Continue current exercise routine to help increase strength and build muscle. Continue to progress workloads as tolerated.             Nutrition & Weight - Outcomes:  Pre Biometrics - 06/25/21 0830       Pre Biometrics   Waist Circumference 34.75 inches    Hip Circumference 38.75 inches    Waist to Hip Ratio 0.9 %    Triceps Skinfold 13 mm    % Body Fat 32.7 %    Grip Strength 18 kg    Flexibility 14.25 in    Single Leg Stand 13 seconds             Post Biometrics - 08/28/21 1145        Post  Biometrics   Waist Circumference 32.75 inches    Hip Circumference 37.75 inches    Waist to Hip Ratio 0.87 %    Triceps Skinfold 9 mm    Grip Strength 18 kg    Flexibility 12.5 in    Single Leg Stand 30 seconds             Nutrition:  Nutrition Therapy & Goals - 08/07/21 1306       Nutrition Therapy   Diet Heart Healthy Diet    Drug/Food Interactions Statins/Certain Fruits    Protein (specify units) 74g-86g/day (1.2-1.4g/kg ideal body weight), minimum 49g-61g/day (0.8-1.0g/day ideal body weight)      Personal Nutrition Goals   Nutrition Goal Patient to add 2 snacks of ~200-300kcals/day to include lean protein + high quality carbohydrate   Choosing roasted nuts, fruit as snack throughout the day. Plans to go ahead and increase to two supplements per day (Glucerna, 180kcals, 15g protein + Boost 240 kcals, 10g protein) to support weight gain to 135#.   Personal Goal #2 Patient to prioritize lean protein intake to support weight management/gain and a heart healthy diet to include lean protein/plant protein, nuts/seeds, low fat dairy, etc at all meals and  snacks.    Comments Patient continues to be motivated to maintain/gain weight. We discussed strategies for weight gain including nutrition supplements (Boost 2x/day, 480kcals, 10g protein), increasing eating frequency and/or portions, and increasing fat intake. Patient is very active throughout the day spending significant parts of her day gardening.      Intervention Plan   Intervention Prescribe, educate  and counsel regarding individualized specific dietary modifications aiming towards targeted core components such as weight, hypertension, lipid management, diabetes, heart failure and other comorbidities.;Nutrition handout(s) given to patient.    Expected Outcomes Short Term Goal: Understand basic principles of dietary content, such as calories, fat, sodium, cholesterol and nutrients.;Short Term Goal: A plan has been developed with personal nutrition goals set during dietitian appointment.;Long Term Goal: Adherence to prescribed nutrition plan.             Nutrition Discharge:  Nutrition Assessments - 08/28/21 1110       Rate Your Plate Scores   Post Score 66             Education Questionnaire Score:  Knowledge Questionnaire Score - 08/28/21 1702       Knowledge Questionnaire Score   Pre Score 15/24    Post Score 19/24             Goals reviewed with patient; copy given to patient.Elizabeth Mcfarland graduated from cardiac rehab program on 09/02/21 with completion of 36 exercise sessions in Phase II. Pt maintained good attendance and progressed nicely during her participation in rehab as evidenced by increased MET level.   Medication list reconciled. Repeat  PHQ score-0  .  Pt has made significant lifestyle changes and should be commended for her success. Pt feels she has achieved her goals during cardiac rehab. Elizabeth Mcfarland did lose 2.2 kg which was a concern for her. Doneta has been in contact with her primary care provider [name] FNP regarding this.   Pt plans to continue exercise by  walking, working in the garden and going to the [employer]. Upon review of Azalyn's medications. Elizabeth Mcfarland said that Dr Sallyanne Kuster told her to stop taking her metoprolol. Will call Dr [name]'s office to clarify.Harrell Gave RN BSN

## 2021-09-03 ENCOUNTER — Other Ambulatory Visit: Payer: Self-pay

## 2021-09-03 ENCOUNTER — Encounter: Payer: Self-pay | Admitting: *Deleted

## 2021-09-03 ENCOUNTER — Telehealth: Payer: Self-pay

## 2021-09-03 DIAGNOSIS — E1169 Type 2 diabetes mellitus with other specified complication: Secondary | ICD-10-CM

## 2021-09-03 DIAGNOSIS — I700 Atherosclerosis of aorta: Secondary | ICD-10-CM

## 2021-09-03 MED ORDER — ATORVASTATIN CALCIUM 80 MG PO TABS: 80.0000 mg | ORAL_TABLET | Freq: Every day | ORAL | 3 refills | Status: DC

## 2021-09-03 MED ORDER — GLIMEPIRIDE 4 MG PO TABS: ORAL_TABLET | ORAL | 1 refills | Status: DC

## 2021-09-03 NOTE — Chronic Care Management (AMB) (Unsigned)
Chronic Care Management Pharmacy Assistant   Name: Elizabeth Mcfarland  MRN: [id number] DOB: Oct 01, 1940  Reason for Encounter: Disease State/ Hypertension  Recent office visits:  08-27-2021 Elizabeth Mcfarland, [place]. Shingrix given. Glucose= 154, Albumin= 4.8, AST= 45, ALT= 66. A1C= 7.0.  Recent consult visits:  09-02-2021  Elizabeth Klein, MD (Cardiology). Visit for cardiac rehab.  08-30-2021 Elizabeth Klein, MD (Cardiology). No changes.  08-29-2021 Elizabeth Corporal, MD (General surgery). Follow up visit. No changes.  08-28-2021 Elizabeth Mcfarland, Elizabeth Gobble, MD (Cardiology). Visit for cardiac rehab.  08-26-2021 Elizabeth Klein, MD (Cardiology). Visit for cardiac rehab.  08-23-2021 Elizabeth Klein, MD (Cardiology). Visit for cardiac rehab.  08-23-2021 Elizabeth Klein, MD (Cardiology). Visit for cardiac rehab.  08-21-2021 Elizabeth Klein, MD (Cardiology). Visit for cardiac rehab.  08-16-2021 Elizabeth Klein, MD (Cardiology). Visit for cardiac rehab.  08-14-2021 Elizabeth Klein, MD (Cardiology). Visit for cardiac rehab.  08-12-2021 Elizabeth Klein, MD (Cardiology). Visit for cardiac rehab.  08-09-2021 Elizabeth Klein, MD (Cardiology). Visit for cardiac rehab.  08-07-2021 Elizabeth Klein, MD (Cardiology). Visit for cardiac rehab.  08-05-2021 Elizabeth Klein, MD (Cardiology). Visit for cardiac rehab.  08-02-2021 Elizabeth Klein, MD (Cardiology). Visit for cardiac rehab.  07-31-2021 Elizabeth Klein, MD (Cardiology). Visit for cardiac rehab.  07-29-2021 Elizabeth Klein, MD (Cardiology). Visit for cardiac rehab.  07-26-2021 Elizabeth Klein, MD (Cardiology). Visit for cardiac rehab.  07-24-2021 Elizabeth Klein, MD (Cardiology). Visit for cardiac rehab.  07-22-2021 Elizabeth Klein, MD (Cardiology). Visit for cardiac rehab.  07-19-2021 Elizabeth Klein, MD (Cardiology). Visit for cardiac rehab.  07-17-2021 Elizabeth Klein, MD (Cardiology). Visit for cardiac rehab.  Hospital visits:  None in  previous 6 months  Medications: Outpatient Encounter Medications as of 09/03/2021  Medication Sig Note   acetaminophen (TYLENOL) 500 MG tablet Take 1,000 mg by mouth every 6 (six) hours as needed for mild pain or moderate pain.    amLODipine (NORVASC) 10 MG tablet Take 1 tablet (10 mg total) by mouth daily.    anastrozole (ARIMIDEX) 1 MG tablet TAKE 1 TABLET(1 MG) BY MOUTH DAILY    Ascorbic Acid (VITAMIN C) 1000 MG tablet Take 1,000 mg by mouth daily.    aspirin EC 81 MG tablet Take 81 mg by mouth daily.    atorvastatin (LIPITOR) 80 MG tablet Take 1 tablet (80 mg total) by mouth daily. (Patient not taking: Reported on 09/02/2021)    cholecalciferol (VITAMIN D) 1000 UNITS tablet Take 1,000 Units by mouth daily.     clopidogrel (PLAVIX) 75 MG tablet Take 1 tablet (75 mg total) by mouth daily with breakfast.    empagliflozin (JARDIANCE) 10 MG TABS tablet Take 1 tablet (10 mg total) by mouth daily before breakfast.    FLUZONE HIGH-DOSE QUADRIVALENT 0.7 ML SUSY  (Patient not taking: Reported on 08/30/2021)    glimepiride (AMARYL) 4 MG tablet TAKE 1 TABLET BY MOUTH EVERY MORNING    metoprolol succinate (TOPROL XL) 25 MG 24 hr tablet Take 1/2 tablet (12.5 mg total) by mouth daily. (Patient not taking: Reported on 09/02/2021) 09/02/2021: Patient said that Dr Sallyanne Kuster stopped her metoprolol at her office visit on 08/30/21. Will clarify.   Multiple Vitamin (MULTIVITAMIN WITH MINERALS) TABS tablet Take 1 tablet by mouth daily.     nitroGLYCERIN (NITROSTAT) 0.4 MG SL tablet Place 1 tablet (0.4 mg total) under the tongue every 5 (five) minutes as needed for chest pain. (Patient not taking: Reported on 08/30/2021) 08/30/2021: Patient has on hand if needed   olmesartan (BENICAR) 40 MG tablet TAKE 1 TABLET(40 MG)  BY MOUTH DAILY    ONETOUCH ULTRA test strip USE AS DIRECTED TWICE DAILY    pantoprazole (PROTONIX) 40 MG tablet Take 1 tablet (40 mg total) by mouth daily. (Patient not taking: Reported on 09/02/2021)     Polyethyl Glycol-Propyl Glycol (SYSTANE OP) Place 1 drop into both eyes 2 (two) times daily.    polyethylene glycol (MIRALAX / GLYCOLAX) packet Take 17 g by mouth daily.     No facility-administered encounter medications on file as of 09/03/2021.   Reviewed chart prior to disease state call. Spoke with patient regarding BP  Recent Office Vitals: BP Readings from Last 3 Encounters:  08/30/21 (!) 162/80  08/27/21 132/72  06/25/21 (!) 144/60   Pulse Readings from Last 3 Encounters:  08/30/21 64  08/27/21 61  06/25/21 65    Wt Readings from Last 3 Encounters:  08/30/21 120 lb 6.4 oz (54.6 kg)  08/27/21 119 lb (54 kg)  07/03/21 122 lb 9.2 oz (55.6 kg)     Kidney Function Lab Results  Component Value Date/Time   CREATININE 0.83 08/27/2021 10:49 AM   CREATININE 0.88 06/12/2021 11:35 AM   CREATININE 0.84 04/05/2021 10:26 AM   CREATININE 0.92 10/03/2020 11:38 AM   GFRNONAA >60 04/09/2021 03:42 AM   GFRNONAA >60 04/05/2021 10:26 AM   GFRAA 64 12/20/2019 04:05 PM   GFRAA >60 08/01/2019 08:45 AM       Latest Ref Rng & Units 08/27/2021   10:49 AM 06/12/2021   11:35 AM 04/09/2021    3:42 AM  BMP  Glucose 70 - 99 mg/dL 154  136  118   BUN 8 - 27 mg/dL '13  12  6   '$ Creatinine 0.57 - 1.00 mg/dL 0.83  0.88  0.62   BUN/Creat Ratio 12 - '28 16  14    '$ Sodium 134 - 144 mmol/L 140  141  139   Potassium 3.5 - 5.2 mmol/L 4.1  4.2  3.4   Chloride 96 - 106 mmol/L 102  105  107   CO2 20 - 29 mmol/L '25  24  25   '$ Calcium 8.7 - 10.3 mg/dL 9.8  9.8  8.9     Current antihypertensive regimen:  Amlodipine 10 mg daily Olmesartan 40 mg daily Metoprolol 25 mg daily  How often are you checking your Blood Pressure? {CHL HP BP Monitoring [phone]} Current home BP readings: *** What recent interventions/DTPs have been made by any provider to improve Blood Pressure control since last CPP Visit:  Educated on Symptoms of hypotension and importance of maintaining adequate hydration; -Counseled  to monitor BP at home at the same time each day, between 8-9 am prior to eating or drinking anything, document, and provide log at future appointments -Recommended to continue current medication  Any recent hospitalizations or ED visits since last visit with CPP? No  What diet changes have been made to improve Blood Pressure Control?  *** What exercise is being done to improve your Blood Pressure Control?  ***  Adherence Review: Is the patient currently on ACE/ARB medication? Yes Does the patient have >5 day gap between last estimated fill dates? No  09-03-2021: 1st attempt left VM 09-05-2021: 2nd attempt left VM  Care Gaps: AWV 06-19-2022  Star Rating Drugs: Atorvastatin 80 mg- Last filled 07-07-2021 90 DS [facility] Jardiance 10 mg- Last filled 08-15-2021 30 DS [facility] Glimepiride 4 mg- Last filled 09-02-2021 90 DS [facility] Olmesartan 40 mg- Last filled 06-19-2021 90 DS [facility] Clinical Pharmacist  Assistant [phone]

## 2021-09-06 ENCOUNTER — Other Ambulatory Visit: Payer: Self-pay

## 2021-09-06 DIAGNOSIS — E1169 Type 2 diabetes mellitus with other specified complication: Secondary | ICD-10-CM

## 2021-09-06 DIAGNOSIS — I700 Atherosclerosis of aorta: Secondary | ICD-10-CM

## 2021-09-06 MED ORDER — GLIMEPIRIDE 4 MG PO TABS: ORAL_TABLET | ORAL | 1 refills | Status: DC

## 2021-09-06 MED ORDER — ATORVASTATIN CALCIUM 80 MG PO TABS: 80.0000 mg | ORAL_TABLET | Freq: Every day | ORAL | 3 refills | Status: DC

## 2021-09-08 ENCOUNTER — Encounter: Payer: Self-pay | Admitting: Nurse Practitioner

## 2021-09-11 NOTE — Telephone Encounter (Signed)
Left message to call back  

## 2021-09-16 NOTE — Telephone Encounter (Signed)
Left a message for the patient to call back.  

## 2021-09-16 NOTE — Telephone Encounter (Signed)
Please wean off metoprolol (take only half tab for 2 days, then stop). OK to stop pantoprazole also

## 2021-09-19 NOTE — Telephone Encounter (Signed)
Left a message for the patient to call back.  

## 2021-09-25 NOTE — Telephone Encounter (Signed)
Patient made aware and verbalized her understanding. 

## 2021-09-26 ENCOUNTER — Other Ambulatory Visit: Payer: Self-pay

## 2021-09-26 DIAGNOSIS — I10 Essential (primary) hypertension: Secondary | ICD-10-CM

## 2021-09-26 MED ORDER — OLMESARTAN MEDOXOMIL 40 MG PO TABS: ORAL_TABLET | ORAL | 1 refills | Status: DC

## 2021-09-29 NOTE — Progress Notes (Deleted)
Elizabeth Mcfarland   [phone] [phone]   Clinic Follow up Note   Patient Care Team: [name], [place] as PCP - General (General Practice) [name], [name], MD as PCP - Cardiology (Cardiology) [name], RN as Oncology Nurse Navigator [name], RN as Oncology Nurse Navigator [name], MD as Consulting Physician (Hematology) [name], MD as Consulting Physician (General Surgery) [name], MD as Consulting Physician (Radiation Oncology) Alla Feeling, NP as Nurse Practitioner (Nurse Practitioner) Mayford Knife, [facility] (Pharmacist) 09/29/2021  CHIEF COMPLAINT: Follow up right breast cancer   SUMMARY OF ONCOLOGIC HISTORY: Oncology History Overview Note  Cancer Staging Malignant neoplasm of upper-inner quadrant of right breast in female, estrogen receptor positive ([place]) Staging form: Breast, AJCC 8th Edition - Clinical stage from 10/07/2018: cT2, cN0, cM0, GX, ER+, PR+, HER2: Equivocal - Signed by [name], MD on 10/12/2018 - Pathologic stage from 12/25/2018: Stage IA (pT2, pN0, cM0, G2, ER+, PR+, HER2-, Oncotype DX score: 21) - Signed by [name], MD on 02/01/2019    Malignant neoplasm of upper-inner quadrant of right breast in female, estrogen receptor positive ([place])  10/01/2018 Imaging   Bone Density Scan 10/01/18  IMPRESSION Lowest T-score of -0.9 at left femur neck (NORMAL) T-score DualFemur Neck Left  10/01/2018    76.1         -0.9    0.916 g/cm2   AP Spine  L1-L4      10/01/2018    76.1         1.2     1.326 g/cm2   DualFemur Total Mean 10/01/2018    76.1         0.1     1.024 g/cm2   10/05/2018 Mammogram   Diagnostic Mammogram 10/05/18 IMPRESSION: 1. Highly suspicious right breast mass 1 o'clock position 2 cm from the nipple on the right. It measures 2.1 x 1.7 x 1.5 cm. Corresponding with the screening mammographic findings. Recommendation is for ultrasound-guided biopsy. 2. Indeterminate left breast mass at the  10 o'clock position 9 cm from the nipple. It measures 1.3 x 0.5 x 0.3 cm. This may represent a benign etiology such as a degenerating fibroadenoma. However, ultrasound-guided biopsy for definitive tissue diagnosis is recommended. 3. No suspicious lymphadenopathy bilaterally.   10/07/2018 Cancer Staging   Staging form: Breast, AJCC 8th Edition - Clinical stage from 10/07/2018: cT2, cN0, cM0, GX, ER+, PR+, HER2: Equivocal - Signed by [name], MD on 10/12/2018   10/07/2018 Initial Biopsy   Diagnosis 10/07/18 1. Breast, right, needle core biopsy, 1 o'clock, 2cmfn - INVASIVE MAMMARY CARCINOMA WITH CALCIFICATIONS. SEE NOTE 2. Breast, left, needle core biopsy, 10 o'clock - FIBROADENOMA WITH DYSTROPHIC CALCIFICATIONS - NEGATIVE FOR CARCINOMA   10/07/2018 Receptors her2   By immunohistochemistry, the tumor cells are EQUIVOCAL for Her2 (2+). HER2 by [place] will be PERFORMED and the RESULTS REPORTED SEPARATELY Estrogen Receptor: 100%, POSITIVE, STRONG STAINING INTENSITY Progesterone Receptor: 70%, POSITIVE, STRONG STAINING INTENSITY Proliferation Marker Ki67: 10%   10/11/2018 Initial Diagnosis   Malignant neoplasm of upper-inner quadrant of right breast in female, estrogen receptor positive ([place])   10/2018 -  Neo-Adjuvant Anti-estrogen oral therapy   She started anastrozole end of 10/2018 before surgery.   11/25/2018 Surgery   BILATERAL BREAST LUMPECTOMIES  WITH BILATERAL  RADIOACTIVE SEEDS AND RIGHT SENTINEL LYMPH NODE BIOPSY by Dr [name]  11/24/28   11/25/2018 Pathology Results   Diagnosis 11/25/18 1. Breast, lumpectomy, Left w/seed - FIBROADENOMA WITH CALCIFICATIONS. -  BIOPSY SITE CHANGES. 2. Lymph node, sentinel, biopsy, Right Axillary #1 - ONE LYMPH NODE, NEGATIVE FOR CARCINOMA (0/1). 3. Lymph node, sentinel, biopsy, Right Axillary #2 - ONE LYMPH NODE, NEGATIVE FOR CARCINOMA (0/1). 4. Lymph node, sentinel, biopsy, Right - ONE LYMPH NODE, NEGATIVE FOR CARCINOMA (0/1). 5. Breast, lumpectomy,  Right w/seed - INVASIVE DUCTAL CARCINOMA, 2.6 CM, NOTTINGHAM GRADE 2 OF 3. - MARGINS OF RESECTION ARE NOT INVOLVED (CLOSEST MARGIN: LESS THAN 1 MM, SUPERIOR). - BIOPSY SITE CHANGES. - SEE ONCOLOGY TABLE. 6. Medical device, removal, radioactive seed - RADIOACTIVE SEED. - GROSS ONLY.   11/25/2018 Oncotype testing   Oncotype 11/25/18 Recurrence Score 21 with distant recurrence risk at 9 years of with Tamoxifen alone 7%.  There is less than 1% benefit from chemo.    12/25/2018 Cancer Staging   Staging form: Breast, AJCC 8th Edition - Pathologic stage from 12/25/2018: Stage IA (pT2, pN0, cM0, G2, ER+, PR+, HER2-, Oncotype DX score: 21) - Signed by [name], MD on 02/01/2019   01/11/2019 - 02/08/2019 Radiation Therapy   Adjuvant Radiation with Dr [name] 01/11/19-02/08/19   05/05/2019 Survivorship   SCP delivered by Cira Rue, NP      CURRENT THERAPY: Anastrozole, starting 10/2018  INTERVAL HISTORY: Elizabeth Mcfarland returns for follow up as scheduled. Last seen by Dr. Burr Medico 04/05/21, at which time a new left breast mass was palpated. She underwent diagnostic L mammo/US whoch showed a hypoechoic mass in the L br 2:00 position, 3 cmfn, measuring 1.6 x 1.5 x 0.8 cm. Biopsy showed benign breast tissue with stromal fibrosis. This was found to be concordant by the pathologist. She continues anastrozole.    REVIEW OF SYSTEMS:   Constitutional: Denies fevers, chills or abnormal weight loss Eyes: Denies blurriness of vision Ears, nose, mouth, throat, and face: Denies mucositis or sore throat Respiratory: Denies cough, dyspnea or wheezes Cardiovascular: Denies palpitation, chest discomfort or lower extremity swelling Gastrointestinal:  Denies nausea, heartburn or change in bowel habits Skin: Denies abnormal skin rashes Lymphatics: Denies new lymphadenopathy or easy bruising Neurological:Denies numbness, tingling or new weaknesses Behavioral/Psych: Mood is stable, no new changes  All other systems were  reviewed with the patient and are negative.  MEDICAL HISTORY:  Past Medical History:  Diagnosis Date   Allergy    Anemia    Blood transfusion without reported diagnosis    Breast cancer ([place]) 11/2018   Right breast invasive mammary carcinoma   Cancer ([place])    Cataract    Chronic kidney disease    Diabetes mellitus without complication ([place])    Esophageal ulcer with bleeding 05/05/2013   05/05/2013 EGD linear distal esophageal ulcer - cause of hematemesis prior to EGD    GERD (gastroesophageal reflux disease)    History of kidney stones    Hyperlipidemia    Hypertension    Kidney cysts    Personal history of radiation therapy    Sarcoidosis 1970    SURGICAL HISTORY: Past Surgical History:  Procedure Laterality Date   ABDOMINAL HYSTERECTOMY     BREAST BIOPSY Left 07/09/2021   BREAST EXCISIONAL BIOPSY Left 11/2018   Fibroadenoma   BREAST LUMPECTOMY     BREAST LUMPECTOMY WITH RADIOACTIVE SEED AND SENTINEL LYMPH NODE BIOPSY Bilateral 11/25/2018   Procedure: BILATERAL BREAST LUMPECTOMIES  WITH BILATERAL  RADIOACTIVE SEEDS AND RIGHT SENTINEL LYMPH NODE BIOPSY;  Surgeon: [name], MD;  Location: [facility];  Service: General;  Laterality: Bilateral;   CATARACT EXTRACTION W/ INTRAOCULAR LENS IMPLANT  COLONOSCOPY     COLONOSCOPY, ESOPHAGOGASTRODUODENOSCOPY (EGD) AND ESOPHAGEAL DILATION N/A 05/05/2013   Procedure: COLONOSCOPY, ESOPHAGOGASTRODUODENOSCOPY (EGD) AND ESOPHAGEAL DILATION (ED);  Surgeon: Gatha Mayer, MD;  Location: [facility];  Service: Endoscopy;  Laterality: N/A;   LEFT HEART CATH AND CORONARY ANGIOGRAPHY N/A 04/08/2021   Procedure: LEFT HEART CATH AND CORONARY ANGIOGRAPHY;  Surgeon: Sherren Mocha, MD;  Location: [facility];  Service: Cardiovascular;  Laterality: N/A;   LITHOTRIPSY     POLYPECTOMY     UPPER GASTROINTESTINAL ENDOSCOPY      I have reviewed the social history and family history with the patient and they are unchanged from previous  note.  ALLERGIES:  is allergic to codeine and percocet [oxycodone-acetaminophen].  MEDICATIONS:  Current Outpatient Medications  Medication Sig Dispense Refill   acetaminophen (TYLENOL) 500 MG tablet Take 1,000 mg by mouth every 6 (six) hours as needed for mild pain or moderate pain.     amLODipine (NORVASC) 10 MG tablet Take 1 tablet (10 mg total) by mouth daily. 90 tablet 1   anastrozole (ARIMIDEX) 1 MG tablet TAKE 1 TABLET(1 MG) BY MOUTH DAILY 30 tablet 3   Ascorbic Acid (VITAMIN C) 1000 MG tablet Take 1,000 mg by mouth daily.     aspirin EC 81 MG tablet Take 81 mg by mouth daily.     atorvastatin (LIPITOR) 80 MG tablet Take 1 tablet (80 mg total) by mouth daily. 90 tablet 3   cholecalciferol (VITAMIN D) 1000 UNITS tablet Take 1,000 Units by mouth daily.      clopidogrel (PLAVIX) 75 MG tablet Take 1 tablet (75 mg total) by mouth daily with breakfast. 90 tablet 3   empagliflozin (JARDIANCE) 10 MG TABS tablet Take 1 tablet (10 mg total) by mouth daily before breakfast. 30 tablet 3   FLUZONE HIGH-DOSE QUADRIVALENT 0.7 ML SUSY  (Patient not taking: Reported on 08/30/2021)     glimepiride (AMARYL) 4 MG tablet TAKE 1 TABLET BY MOUTH EVERY MORNING 90 tablet 1   Multiple Vitamin (MULTIVITAMIN WITH MINERALS) TABS tablet Take 1 tablet by mouth daily.      nitroGLYCERIN (NITROSTAT) 0.4 MG SL tablet Place 1 tablet (0.4 mg total) under the tongue every 5 (five) minutes as needed for chest pain. (Patient not taking: Reported on 08/30/2021) 25 tablet 1   olmesartan (BENICAR) 40 MG tablet TAKE 1 TABLET(40 MG) BY MOUTH DAILY 90 tablet 1   ONETOUCH ULTRA test strip USE AS DIRECTED TWICE DAILY 100 strip 3   Polyethyl Glycol-Propyl Glycol (SYSTANE OP) Place 1 drop into both eyes 2 (two) times daily.     polyethylene glycol (MIRALAX / GLYCOLAX) packet Take 17 g by mouth daily.      No current facility-administered medications for this visit.    PHYSICAL EXAMINATION: ECOG PERFORMANCE STATUS: {CHL ONC ECOG  [phone]}  There were no vitals filed for this visit. There were no vitals filed for this visit.  GENERAL:alert, no distress and comfortable SKIN: skin color, texture, turgor are normal, no rashes or significant lesions EYES: normal, Conjunctiva are pink and non-injected, sclera clear OROPHARYNX:no exudate, no erythema and lips, buccal mucosa, and tongue normal  NECK: supple, thyroid normal size, non-tender, without nodularity LYMPH:  no palpable lymphadenopathy in the cervical, axillary or inguinal LUNGS: clear to auscultation and percussion with normal breathing effort HEART: regular rate & rhythm and no murmurs and no lower extremity edema ABDOMEN:abdomen soft, non-tender and normal bowel sounds Musculoskeletal:no cyanosis of digits and no clubbing  NEURO: alert &  oriented x 3 with fluent speech, no focal motor/sensory deficits  LABORATORY DATA:  I have reviewed the data as listed    Latest Ref Rng & Units 08/27/2021   10:49 AM 04/09/2021    3:42 AM 04/05/2021   10:26 AM  CBC  WBC 3.4 - 10.8 x10E3/uL 4.9  6.3  6.5   Hemoglobin 11.1 - 15.9 g/dL 13.2  11.2  11.2   Hematocrit 34.0 - 46.6 % 39.8  33.6  34.9   Platelets 150 - 450 x10E3/uL 225  218  232         Latest Ref Rng & Units 08/27/2021   10:49 AM 06/12/2021   11:35 AM 04/09/2021    3:42 AM  CMP  Glucose 70 - 99 mg/dL 154  136  118   BUN 8 - 27 mg/dL '13  12  6   ' Creatinine 0.57 - 1.00 mg/dL 0.83  0.88  0.62   Sodium 134 - 144 mmol/L 140  141  139   Potassium 3.5 - 5.2 mmol/L 4.1  4.2  3.4   Chloride 96 - 106 mmol/L 102  105  107   CO2 20 - 29 mmol/L '25  24  25   ' Calcium 8.7 - 10.3 mg/dL 9.8  9.8  8.9   Total Protein 6.0 - 8.5 g/dL 7.1     Total Bilirubin 0.0 - 1.2 mg/dL 0.7     Alkaline Phos 44 - 121 IU/L 80     AST 0 - 40 IU/L 45     ALT 0 - 32 IU/L 66         RADIOGRAPHIC STUDIES: I have personally reviewed the radiological images as listed and agreed with the findings in the report. No results found.    ASSESSMENT & PLAN:  No problem-specific Assessment & Plan notes found for this encounter.   No orders of the defined types were placed in this encounter.  All questions were answered. The patient knows to call the clinic with any problems, questions or concerns. No barriers to learning was detected. I spent {CHL ONC TIME VISIT - YVGCY:[id number]} counseling the patient face to face. The total time spent in the appointment was {CHL ONC TIME VISIT - UAUEB:[id number]} and more than 50% was on counseling and review of test results     Alla Feeling, NP 09/29/21

## 2021-10-01 ENCOUNTER — Inpatient Hospital Stay: Payer: Medicare Other | Attending: Nurse Practitioner

## 2021-10-01 ENCOUNTER — Inpatient Hospital Stay: Payer: Medicare Other | Admitting: Nurse Practitioner

## 2021-10-01 DIAGNOSIS — C50211 Malignant neoplasm of upper-inner quadrant of right female breast: Secondary | ICD-10-CM | POA: Insufficient documentation

## 2021-10-01 DIAGNOSIS — Z170 Estrogen receptor positive status [ER+]: Secondary | ICD-10-CM | POA: Insufficient documentation

## 2021-10-01 DIAGNOSIS — Z79811 Long term (current) use of aromatase inhibitors: Secondary | ICD-10-CM | POA: Insufficient documentation

## 2021-10-01 DIAGNOSIS — E119 Type 2 diabetes mellitus without complications: Secondary | ICD-10-CM | POA: Insufficient documentation

## 2021-10-01 DIAGNOSIS — Z79899 Other long term (current) drug therapy: Secondary | ICD-10-CM | POA: Insufficient documentation

## 2021-10-01 DIAGNOSIS — Z7984 Long term (current) use of oral hypoglycemic drugs: Secondary | ICD-10-CM | POA: Insufficient documentation

## 2021-10-01 DIAGNOSIS — I10 Essential (primary) hypertension: Secondary | ICD-10-CM | POA: Insufficient documentation

## 2021-10-01 DIAGNOSIS — Z7982 Long term (current) use of aspirin: Secondary | ICD-10-CM | POA: Insufficient documentation

## 2021-10-08 ENCOUNTER — Telehealth: Payer: Self-pay | Admitting: Hematology

## 2021-10-08 NOTE — Telephone Encounter (Signed)
.  Called patient to schedule appointment per 7/17 inbasket, patient is aware of date and time.   

## 2021-10-09 DIAGNOSIS — H401131 Primary open-angle glaucoma, bilateral, mild stage: Secondary | ICD-10-CM | POA: Diagnosis not present

## 2021-10-10 ENCOUNTER — Telehealth: Payer: Self-pay

## 2021-10-10 NOTE — Chronic Care Management (AMB) (Signed)
Care Gap(s) Not Met that Need to be Addressed: Controlling High Blood Pressure  Action Taken: Reviewed patient's chart. Patient's last BP on 09-02-2021 124/50. Taking olmesartan daily   Follow Up: None needed   Care Gap(s) Not Met that Need to be Addressed: Kidney Health Evaluation for Patients With Diabetes   Action Taken: Reviewed patient's chart. Patient is taking jardiance.    Follow Up: None needed   [place] Clinical Pharmacist Assistant [phone]

## 2021-10-17 ENCOUNTER — Other Ambulatory Visit: Payer: Self-pay

## 2021-10-17 DIAGNOSIS — Z170 Estrogen receptor positive status [ER+]: Secondary | ICD-10-CM

## 2021-10-20 NOTE — Progress Notes (Unsigned)
Elizabeth Mcfarland   [phone] [phone]   Clinic Follow up Note   Patient Care Team: [name], [place] as PCP - General (General Practice) [name], [name], MD as PCP - Cardiology (Cardiology) [name], RN as Oncology Nurse Navigator [name], RN as Oncology Nurse Navigator [name], MD as Consulting Physician (Hematology) [name], MD as Consulting Physician (General Surgery) [name], MD as Consulting Physician (Radiation Oncology) Alla Feeling, NP as Nurse Practitioner (Nurse Practitioner) Mayford Knife, [facility] (Pharmacist) 10/21/2021  CHIEF COMPLAINT: Follow up right breast cancer   SUMMARY OF ONCOLOGIC HISTORY: Oncology History Overview Note  Cancer Staging Malignant neoplasm of upper-inner quadrant of right breast in female, estrogen receptor positive ([place]) Staging form: Breast, AJCC 8th Edition - Clinical stage from 10/07/2018: cT2, cN0, cM0, GX, ER+, PR+, HER2: Equivocal - Signed by [name], MD on 10/12/2018 - Pathologic stage from 12/25/2018: Stage IA (pT2, pN0, cM0, G2, ER+, PR+, HER2-, Oncotype DX score: 21) - Signed by [name], MD on 02/01/2019    Malignant neoplasm of upper-inner quadrant of right breast in female, estrogen receptor positive ([place])  10/01/2018 Imaging   Bone Density Scan 10/01/18  IMPRESSION Lowest T-score of -0.9 at left femur neck (NORMAL) T-score DualFemur Neck Left  10/01/2018    76.1         -0.9    0.916 g/cm2   AP Spine  L1-L4      10/01/2018    76.1         1.2     1.326 g/cm2   DualFemur Total Mean 10/01/2018    76.1         0.1     1.024 g/cm2   10/05/2018 Mammogram   Diagnostic Mammogram 10/05/18 IMPRESSION: 1. Highly suspicious right breast mass 1 o'clock position 2 cm from the nipple on the right. It measures 2.1 x 1.7 x 1.5 cm. Corresponding with the screening mammographic findings. Recommendation is for ultrasound-guided biopsy. 2. Indeterminate left breast mass at  the 10 o'clock position 9 cm from the nipple. It measures 1.3 x 0.5 x 0.3 cm. This may represent a benign etiology such as a degenerating fibroadenoma. However, ultrasound-guided biopsy for definitive tissue diagnosis is recommended. 3. No suspicious lymphadenopathy bilaterally.   10/07/2018 Cancer Staging   Staging form: Breast, AJCC 8th Edition - Clinical stage from 10/07/2018: cT2, cN0, cM0, GX, ER+, PR+, HER2: Equivocal - Signed by [name], MD on 10/12/2018   10/07/2018 Initial Biopsy   Diagnosis 10/07/18 1. Breast, right, needle core biopsy, 1 o'clock, 2cmfn - INVASIVE MAMMARY CARCINOMA WITH CALCIFICATIONS. SEE NOTE 2. Breast, left, needle core biopsy, 10 o'clock - FIBROADENOMA WITH DYSTROPHIC CALCIFICATIONS - NEGATIVE FOR CARCINOMA   10/07/2018 Receptors her2   By immunohistochemistry, the tumor cells are EQUIVOCAL for Her2 (2+). HER2 by [place] will be PERFORMED and the RESULTS REPORTED SEPARATELY Estrogen Receptor: 100%, POSITIVE, STRONG STAINING INTENSITY Progesterone Receptor: 70%, POSITIVE, STRONG STAINING INTENSITY Proliferation Marker Ki67: 10%   10/11/2018 Initial Diagnosis   Malignant neoplasm of upper-inner quadrant of right breast in female, estrogen receptor positive ([place])   10/2018 -  Neo-Adjuvant Anti-estrogen oral therapy   She started anastrozole end of 10/2018 before surgery.   11/25/2018 Surgery   BILATERAL BREAST LUMPECTOMIES  WITH BILATERAL  RADIOACTIVE SEEDS AND RIGHT SENTINEL LYMPH NODE BIOPSY by Dr [name]  11/24/28   11/25/2018 Pathology Results   Diagnosis 11/25/18 1. Breast, lumpectomy, Left w/seed - FIBROADENOMA WITH CALCIFICATIONS. -  BIOPSY SITE CHANGES. 2. Lymph node, sentinel, biopsy, Right Axillary #1 - ONE LYMPH NODE, NEGATIVE FOR CARCINOMA (0/1). 3. Lymph node, sentinel, biopsy, Right Axillary #2 - ONE LYMPH NODE, NEGATIVE FOR CARCINOMA (0/1). 4. Lymph node, sentinel, biopsy, Right - ONE LYMPH NODE, NEGATIVE FOR CARCINOMA (0/1). 5. Breast, lumpectomy,  Right w/seed - INVASIVE DUCTAL CARCINOMA, 2.6 CM, NOTTINGHAM GRADE 2 OF 3. - MARGINS OF RESECTION ARE NOT INVOLVED (CLOSEST MARGIN: LESS THAN 1 MM, SUPERIOR). - BIOPSY SITE CHANGES. - SEE ONCOLOGY TABLE. 6. Medical device, removal, radioactive seed - RADIOACTIVE SEED. - GROSS ONLY.   11/25/2018 Oncotype testing   Oncotype 11/25/18 Recurrence Score 21 with distant recurrence risk at 9 years of with Tamoxifen alone 7%.  There is less than 1% benefit from chemo.    12/25/2018 Cancer Staging   Staging form: Breast, AJCC 8th Edition - Pathologic stage from 12/25/2018: Stage IA (pT2, pN0, cM0, G2, ER+, PR+, HER2-, Oncotype DX score: 21) - Signed by [name], MD on 02/01/2019   01/11/2019 - 02/08/2019 Radiation Therapy   Adjuvant Radiation with Dr [name] 01/11/19-02/08/19   05/05/2019 Survivorship   SCP delivered by Cira Rue, NP      CURRENT THERAPY: Anastrozole 1 mg daily, starting 10/2018  INTERVAL HISTORY: Elizabeth Mcfarland returns for follow up as scheduled. Last seen by Dr. Burr Medico 04/05/21. Mammogram 06/25/21 showed breast density cat C and a mass in the left breast 2:00 position, 3cmfn measuring 1.6, x1.5 x 0.8 cm. Pathology revealed benign breast tissue with increased stromal fibrosis, no malignancy. This was concordant. She continues anastrozole, tolerating well. She has occasional low back pain flares from arthritis. She remains very active in her garden/yard. She has started eating healthier after stent in Jan. She is on newer cardiac meds that make her feel tired and sluggish in the mornings. She is losing some weight. She feels the lump in left breast that was evaluated on mammogram, some days she doesn't feel it as well.  She also has lymphedema in the right breast that fluctuates but overall is not bothering her.  She does not want to see PT for now.  Denies new mass, nipple discharge, inversion, or skin change. Denies change in bowel habits, bleeding, abdominal pain/bloating, or any other new or  specific complaints.   All other systems were reviewed with the patient and are negative.  MEDICAL HISTORY:  Past Medical History:  Diagnosis Date   Allergy    Anemia    Blood transfusion without reported diagnosis    Breast cancer ([place]) 11/2018   Right breast invasive mammary carcinoma   Cancer ([place])    Cataract    Chronic kidney disease    Diabetes mellitus without complication ([place])    Esophageal ulcer with bleeding 05/05/2013   05/05/2013 EGD linear distal esophageal ulcer - cause of hematemesis prior to EGD    GERD (gastroesophageal reflux disease)    History of kidney stones    Hyperlipidemia    Hypertension    Kidney cysts    Personal history of radiation therapy    Sarcoidosis 1970    SURGICAL HISTORY: Past Surgical History:  Procedure Laterality Date   ABDOMINAL HYSTERECTOMY     BREAST BIOPSY Left 07/09/2021   BREAST EXCISIONAL BIOPSY Left 11/2018   Fibroadenoma   BREAST LUMPECTOMY     BREAST LUMPECTOMY WITH RADIOACTIVE SEED AND SENTINEL LYMPH NODE BIOPSY Bilateral 11/25/2018   Procedure: BILATERAL BREAST LUMPECTOMIES  WITH BILATERAL  RADIOACTIVE SEEDS AND RIGHT SENTINEL LYMPH NODE BIOPSY;  Surgeon: [name], MD;  Location: [facility];  Service: General;  Laterality: Bilateral;   CATARACT EXTRACTION W/ INTRAOCULAR LENS IMPLANT     COLONOSCOPY     COLONOSCOPY, ESOPHAGOGASTRODUODENOSCOPY (EGD) AND ESOPHAGEAL DILATION N/A 05/05/2013   Procedure: COLONOSCOPY, ESOPHAGOGASTRODUODENOSCOPY (EGD) AND ESOPHAGEAL DILATION (ED);  Surgeon: Gatha Mayer, MD;  Location: [facility];  Service: Endoscopy;  Laterality: N/A;   LEFT HEART CATH AND CORONARY ANGIOGRAPHY N/A 04/08/2021   Procedure: LEFT HEART CATH AND CORONARY ANGIOGRAPHY;  Surgeon: Sherren Mocha, MD;  Location: [facility];  Service: Cardiovascular;  Laterality: N/A;   LITHOTRIPSY     POLYPECTOMY     UPPER GASTROINTESTINAL ENDOSCOPY      I have reviewed the social history and family history with the  patient and they are unchanged from previous note.  ALLERGIES:  is allergic to codeine and percocet [oxycodone-acetaminophen].  MEDICATIONS:  Current Outpatient Medications  Medication Sig Dispense Refill   acetaminophen (TYLENOL) 500 MG tablet Take 1,000 mg by mouth every 6 (six) hours as needed for mild pain or moderate pain.     amLODipine (NORVASC) 10 MG tablet Take 1 tablet (10 mg total) by mouth daily. 90 tablet 1   anastrozole (ARIMIDEX) 1 MG tablet TAKE 1 TABLET(1 MG) BY MOUTH DAILY 30 tablet 3   Ascorbic Acid (VITAMIN C) 1000 MG tablet Take 1,000 mg by mouth daily.     aspirin EC 81 MG tablet Take 81 mg by mouth daily.     atorvastatin (LIPITOR) 80 MG tablet Take 1 tablet (80 mg total) by mouth daily. 90 tablet 3   cholecalciferol (VITAMIN D) 1000 UNITS tablet Take 1,000 Units by mouth daily.      clopidogrel (PLAVIX) 75 MG tablet Take 1 tablet (75 mg total) by mouth daily with breakfast. 90 tablet 3   empagliflozin (JARDIANCE) 10 MG TABS tablet Take 1 tablet (10 mg total) by mouth daily before breakfast. 30 tablet 3   FLUZONE HIGH-DOSE QUADRIVALENT 0.7 ML SUSY  (Patient not taking: Reported on 08/30/2021)     glimepiride (AMARYL) 4 MG tablet TAKE 1 TABLET BY MOUTH EVERY MORNING 90 tablet 1   Multiple Vitamin (MULTIVITAMIN WITH MINERALS) TABS tablet Take 1 tablet by mouth daily.      nitroGLYCERIN (NITROSTAT) 0.4 MG SL tablet Place 1 tablet (0.4 mg total) under the tongue every 5 (five) minutes as needed for chest pain. (Patient not taking: Reported on 08/30/2021) 25 tablet 1   olmesartan (BENICAR) 40 MG tablet TAKE 1 TABLET(40 MG) BY MOUTH DAILY 90 tablet 1   ONETOUCH ULTRA test strip USE AS DIRECTED TWICE DAILY 100 strip 3   Polyethyl Glycol-Propyl Glycol (SYSTANE OP) Place 1 drop into both eyes 2 (two) times daily.     polyethylene glycol (MIRALAX / GLYCOLAX) packet Take 17 g by mouth daily.      No current facility-administered medications for this visit.    PHYSICAL  EXAMINATION: ECOG PERFORMANCE STATUS: 1 - Symptomatic but completely ambulatory  Vitals:   10/21/21 0934  BP: (!) 145/67  Pulse: 63  Resp: 18  Temp: 98.7 F (37.1 C)  SpO2: 99%   Filed Weights   10/21/21 0934  Weight: 117 lb 4.8 oz (53.2 kg)    GENERAL:alert, no distress and comfortable SKIN: few small scattered areas of hyperpigmentation to bilateral forearms  EYES: sclera clear OROPHARYNX:no exudate, no erythema and lips, buccal mucosa, and tongue normal  NECK: without mass LYMPH:  no palpable cervical or supraclavicular lymphadenopathy  LUNGS: clear with normal breathing effort HEART: regular rate & rhythm, no lower extremity edema ABDOMEN:abdomen soft, non-tender and normal bowel sounds Musculoskeletal: No focal spinal tenderness NEURO: alert & oriented x 3 with fluent speech, no focal motor/sensory deficits Breast exam: No bilateral nipple discharge or inversion.  S/p right lumpectomy and radiation, incisions completely healed with moderate lymphedema.  No palpable right breast mass/nodularity or adenopathy.  Left breast with keloid to the anterior chest wall.  Soft tissue density and clumped density in the upper outer left breast which organize into a masslike area measuring 4 x 4 cm.  No axillary adenopathy that I could appreciate  LABORATORY DATA:  I have reviewed the data as listed    Latest Ref Rng & Units 10/21/2021    9:14 AM 08/27/2021   10:49 AM 04/09/2021    3:42 AM  CBC  WBC 4.0 - 10.5 K/uL 5.3  4.9  6.3   Hemoglobin 12.0 - 15.0 g/dL 12.6  13.2  11.2   Hematocrit 36.0 - 46.0 % 38.5  39.8  33.6   Platelets 150 - 400 K/uL 203  225  218         Latest Ref Rng & Units 10/21/2021    9:14 AM 08/27/2021   10:49 AM 06/12/2021   11:35 AM  CMP  Glucose 70 - 99 mg/dL 144  154  136   BUN 8 - 23 mg/dL _0 Creatinine 0.44 - 1.00 mg/dL 0.81  0.83  0.88   Sodium 135 - 145 mmol/L 139  140  141   Potassium 3.5 - 5.1 mmol/L 4.2  4.1  4.2   Chloride 98 - 111  mmol/L 105  102  105   CO2 22 - 32 mmol/L _1 Calcium 8.9 - 10.3 mg/dL 9.4  9.8  9.8   Total Protein 6.5 - 8.1 g/dL 7.0  7.1    Total Bilirubin 0.3 - 1.2 mg/dL 0.7  0.7    Alkaline Phos 38 - 126 U/L 66  80    AST 15 - 41 U/L 29  45    ALT 0 - 44 U/L 33  66        RADIOGRAPHIC STUDIES: I have personally reviewed the radiological images as listed and agreed with the findings in the report. No results found.   ASSESSMENT & PLAN:  Elizabeth Mcfarland is a 81 y.o. female with     1. 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 S/p neoadjuvant anastrozole, b/l lumpectomy, and adjuvant radiation.  Oncotype was low risk, adjuvant chemo was not recommended.  She continues antiestrogen therapy with anastrozole starting 10/2018 -Last mammogram 06/25/2016 with left ultrasound and biopsy showed 2 areas of benign breast tissue with stromal fibrosis, no malignancy.  This was concordant by pathology  -Elizabeth Mcfarland is clinically doing well. She continues to tolerate anastrozole, continue.  -Today's exam shows lymphedema in the right breast and an enlarging localized masslike area in the upper outer quadrant of the left breast in the vicinity of the previous biopsy.  I am recommending a 41-monthinterval left mammo/ultrasound to ensure stability -labs are unremarkable.  -if work up is negative, continue surveillance and AI with 6 month f/up. -I will call her with results.   2. 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 Continue to f/u with PCP  -She  had peripheral neuropathy in her feet that has since resolved -she had severe CAD with stenosis and underwent stenting in 03/2021, no residual occlusion. Cardiac meds have been adjusted and she has mild fatigue from that.  -continue PCP and cardiology f/up   3. Bone health -08/2018 DEXA was normal with lowest T-score 0.9 at left  femur neck. -On calcium and Vitamin D.  -Repeat DEXA 10/10/2020 showed mild osteopenia, lowest T score -1.8. continue calcium, vit D, and weight bearing exercise -repeat in 2024  PLAN: -labs and 06/2021 mammo/US and biopsy reviewed -repeat L mammo/US to ensure stability, I will call with results  -continue anastrozole -if work up is negative, next surveillance visit in 6 months     Orders Placed This Encounter  Procedures   MM DIAG BREAST TOMO UNI LEFT    Standing Status:   Future    Standing Expiration Date:   10/22/2022    Order Specific Question:   Reason for Exam (SYMPTOM  OR DIAGNOSIS REQUIRED)    Answer:   abnormal mammo 06/2021 with US/biopsy, benign path. area measures slightly larger, repeat mammo/US to ensure stability    Order Specific Question:   Preferred imaging location?    Answer:   [facility]   All questions were answered. The patient knows to call the clinic with any problems, questions or concerns. No barriers to learning was detected. I spent 20 minutes counseling the patient face to face. The total time spent in the appointment was 30 minutes and more than 50% was on counseling and review of test results.     Alla Feeling, NP 10/21/21

## 2021-10-21 ENCOUNTER — Other Ambulatory Visit: Payer: Self-pay

## 2021-10-21 ENCOUNTER — Inpatient Hospital Stay: Payer: Medicare Other | Admitting: Nurse Practitioner

## 2021-10-21 ENCOUNTER — Encounter: Payer: Self-pay | Admitting: Nurse Practitioner

## 2021-10-21 ENCOUNTER — Inpatient Hospital Stay: Payer: Medicare Other

## 2021-10-21 VITALS — BP 145/67 | HR 63 | Temp 98.7°F | Resp 18 | Ht 63.5 in | Wt 117.3 lb

## 2021-10-21 DIAGNOSIS — C50211 Malignant neoplasm of upper-inner quadrant of right female breast: Secondary | ICD-10-CM

## 2021-10-21 DIAGNOSIS — I10 Essential (primary) hypertension: Secondary | ICD-10-CM | POA: Diagnosis not present

## 2021-10-21 DIAGNOSIS — Z7982 Long term (current) use of aspirin: Secondary | ICD-10-CM | POA: Diagnosis not present

## 2021-10-21 DIAGNOSIS — Z170 Estrogen receptor positive status [ER+]: Secondary | ICD-10-CM

## 2021-10-21 DIAGNOSIS — Z79899 Other long term (current) drug therapy: Secondary | ICD-10-CM | POA: Diagnosis not present

## 2021-10-21 DIAGNOSIS — Z7984 Long term (current) use of oral hypoglycemic drugs: Secondary | ICD-10-CM | POA: Diagnosis not present

## 2021-10-21 DIAGNOSIS — Z79811 Long term (current) use of aromatase inhibitors: Secondary | ICD-10-CM | POA: Diagnosis not present

## 2021-10-21 DIAGNOSIS — E119 Type 2 diabetes mellitus without complications: Secondary | ICD-10-CM | POA: Diagnosis not present

## 2021-10-21 LAB — CBC WITH DIFFERENTIAL (CANCER CENTER ONLY)
Abs Immature Granulocytes: 0.02 10*3/uL (ref 0.00–0.07)
Basophils Absolute: 0 10*3/uL (ref 0.0–0.1)
Basophils Relative: 0 %
Eosinophils Absolute: 0.1 10*3/uL (ref 0.0–0.5)
Eosinophils Relative: 1 %
HCT: 38.5 % (ref 36.0–46.0)
Hemoglobin: 12.6 g/dL (ref 12.0–15.0)
Immature Granulocytes: 0 %
Lymphocytes Relative: 17 %
Lymphs Abs: 0.9 10*3/uL (ref 0.7–4.0)
MCH: 30.3 pg (ref 26.0–34.0)
MCHC: 32.7 g/dL (ref 30.0–36.0)
MCV: 92.5 fL (ref 80.0–100.0)
Monocytes Absolute: 0.4 10*3/uL (ref 0.1–1.0)
Monocytes Relative: 8 %
Neutro Abs: 3.9 10*3/uL (ref 1.7–7.7)
Neutrophils Relative %: 74 %
Platelet Count: 203 10*3/uL (ref 150–400)
RBC: 4.16 MIL/uL (ref 3.87–5.11)
RDW: 12.4 % (ref 11.5–15.5)
WBC Count: 5.3 10*3/uL (ref 4.0–10.5)
nRBC: 0 % (ref 0.0–0.2)

## 2021-10-21 LAB — CMP (CANCER CENTER ONLY)
ALT: 33 U/L (ref 0–44)
AST: 29 U/L (ref 15–41)
Albumin: 4.3 g/dL (ref 3.5–5.0)
Alkaline Phosphatase: 66 U/L (ref 38–126)
Anion gap: 3 — ABNORMAL LOW (ref 5–15)
BUN: 16 mg/dL (ref 8–23)
CO2: 31 mmol/L (ref 22–32)
Calcium: 9.4 mg/dL (ref 8.9–10.3)
Chloride: 105 mmol/L (ref 98–111)
Creatinine: 0.81 mg/dL (ref 0.44–1.00)
GFR, Estimated: >60 mL/min (ref >60)
Glucose, Bld: 144 mg/dL — ABNORMAL HIGH (ref 70–99)
Potassium: 4.2 mmol/L (ref 3.5–5.1)
Sodium: 139 mmol/L (ref 135–145)
Total Bilirubin: 0.7 mg/dL (ref 0.3–1.2)
Total Protein: 7 g/dL (ref 6.5–8.1)

## 2021-10-23 ENCOUNTER — Telehealth: Payer: Self-pay | Admitting: Nurse Practitioner

## 2021-10-23 NOTE — Telephone Encounter (Signed)
Left message with follow-up appointment per 7/31 los. 

## 2021-10-24 ENCOUNTER — Other Ambulatory Visit: Payer: Self-pay | Admitting: Nurse Practitioner

## 2021-10-24 ENCOUNTER — Encounter: Payer: Self-pay | Admitting: Internal Medicine

## 2021-10-24 ENCOUNTER — Ambulatory Visit: Payer: Medicare Other | Admitting: Internal Medicine

## 2021-10-24 VITALS — BP 140/76 | HR 69 | Ht 62.0 in | Wt 114.0 lb

## 2021-10-24 DIAGNOSIS — R933 Abnormal findings on diagnostic imaging of other parts of digestive tract: Secondary | ICD-10-CM | POA: Diagnosis not present

## 2021-10-24 DIAGNOSIS — K581 Irritable bowel syndrome with constipation: Secondary | ICD-10-CM

## 2021-10-24 DIAGNOSIS — C50211 Malignant neoplasm of upper-inner quadrant of right female breast: Secondary | ICD-10-CM

## 2021-10-24 DIAGNOSIS — R634 Abnormal weight loss: Secondary | ICD-10-CM | POA: Diagnosis not present

## 2021-10-24 NOTE — Patient Instructions (Signed)
You have been scheduled for a CT scan of the abdomen and pelvis at [facility], 1st floor Radiology. You are scheduled on 10/28/2021 at 9:00am. You should arrive 15 minutes prior to your appointment time for registration.  We are giving you 2 bottles of contrast today that you will need to drink before arriving for the exam. The solution may taste better if refrigerated so put them in the refrigerator when you get home, but do NOT add ice or any other liquid to this solution as that would dilute it. Shake well before drinking.   Please follow the written instructions below on the day of your exam:   1) Do not eat anything after 5:00am (4 hours prior to your test)   2) Drink 1 bottle of contrast @ 7:00am (2 hours prior to your exam)  Remember to shake well before drinking and do NOT pour over ice.     Drink 1 bottle of contrast @ 8:00am (1 hour prior to your exam)   You may take any medications as prescribed with a small amount of water, if necessary. If you take any of the following medications: METFORMIN, GLUCOPHAGE, GLUCOVANCE, AVANDAMET, RIOMET, FORTAMET, [place] MET, JANUMET, GLUMETZA or METAGLIP, you MAY be asked to HOLD this medication 48 hours AFTER the exam.   The purpose of you drinking the oral contrast is to aid in the visualization of your intestinal tract. The contrast solution may cause some diarrhea. Depending on your individual set of symptoms, you may also receive an intravenous injection of x-ray contrast/dye. Plan on being at [facility] for 45 minutes or longer, depending on the type of exam you are having performed.   If you have any questions regarding your exam or if you need to reschedule, you may call [facility] at [phone] between the hours of 8:00 am and 5:00 pm, Monday-Friday.   I appreciate the opportunity to care for you. Silvano Rusk, MD, [facility]

## 2021-10-24 NOTE — Progress Notes (Signed)
Elizabeth Mcfarland 81 y.o. May 15, 1940 [id number]  Assessment & Plan:   Encounter Diagnoses  Name Primary?   Loss of weight Yes   Abnormal CT scan, gastrointestinal tract - pancreatic duct prominent    Irritable bowel syndrome with constipation     Cause of weight loss is not clear.  Given this abnormal prominent pancreatic duct of unclear significance last year we will repeat a CT of the abdomen pelvis.  She was having right upper quadrant pain then.  Not an issue now.  Her IBS-C is well controlled on MiraLAX.  CC: [name], FNP    Subjective:   Chief Complaint: Weight loss abnormal CT scan 985  HPI 81 year old woman with a history of breast cancer, diabetes mellitus, hypertension who has been losing weight for unclear etiology.  Appetite good though she is on low saturated fat diet given that she had a cardiac stent placed at the beginning of this year in January.  Eats well no problems.  However weight has been going down-see below.  She does not have nausea, no abdominal pain no bowel habit changes.  She uses MiraLAX daily for constipation issues with good results.  She had a slightly dilated pancreatic duct as mentioned in the imaging reporting below, last year.  She remains active in her garden growing a lot of vegetables and eating as many as she can.  She feels maybe slightly tired but otherwise no particular problems.   Wt Readings from Last 3 Encounters:  10/24/21 114 lb (51.7 kg)  10/21/21 117 lb 4.8 oz (53.2 kg)  09/02/21 118 lb 6.2 oz (53.7 kg)  129# 1/23  CT abdomen and pelvis with and without contrast Aug 15, 2020 IMPRESSION: 1. Prominence of the main pancreatic duct in the head/neck of the pancreas measuring up to 3 mm, without evidence of ductal dilation in the body/tail of the pancreas and no discrete obstructing etiology visualized. Given the lack of obstructive etiology or other suspicious features on CT the ductal size is favored to be with  in normal limits for patients age. 2. Nonobstructive left nephrolithiasis. 3. Aortic atherosclerosis.  Ultrasound right upper quadrant March 2022 IMPRESSION: No evidence of gallstones or hepatobiliary abnormality.   Mild pancreatic ductal dilatation noted in visualized portion of pancreatic body. Consider further evaluation with pancreatic protocol abdomen CT with contrast.   9 cm right renal cyst.  Lab Results  Component Value Date   HGBA1C 7.0 (H) 08/27/2021   Colonoscopy September 21, 2018 with a diminutive adenomatous polyp EGD February 2015 esophageal ulcer and a 2 cm hiatal hernia   Allergies  Allergen Reactions   Codeine Nausea And Vomiting   Percocet [Oxycodone-Acetaminophen] Nausea And Vomiting   Current Meds  Medication Sig   acetaminophen (TYLENOL) 500 MG tablet Take 1,000 mg by mouth every 6 (six) hours as needed for mild pain or moderate pain.   amLODipine (NORVASC) 10 MG tablet Take 1 tablet (10 mg total) by mouth daily.   anastrozole (ARIMIDEX) 1 MG tablet TAKE 1 TABLET(1 MG) BY MOUTH DAILY   Ascorbic Acid (VITAMIN C) 1000 MG tablet Take 1,000 mg by mouth daily.   aspirin EC 81 MG tablet Take 81 mg by mouth daily.   atorvastatin (LIPITOR) 80 MG tablet Take 1 tablet (80 mg total) by mouth daily.   cholecalciferol (VITAMIN D) 1000 UNITS tablet Take 1,000 Units by mouth daily.    clopidogrel (PLAVIX) 75 MG tablet Take 1 tablet (75 mg total) by mouth daily with  breakfast.   empagliflozin (JARDIANCE) 10 MG TABS tablet Take 1 tablet (10 mg total) by mouth daily before breakfast.   FLUZONE HIGH-DOSE QUADRIVALENT 0.7 ML SUSY    glimepiride (AMARYL) 4 MG tablet TAKE 1 TABLET BY MOUTH EVERY MORNING   Multiple Vitamin (MULTIVITAMIN WITH MINERALS) TABS tablet Take 1 tablet by mouth daily.    olmesartan (BENICAR) 40 MG tablet TAKE 1 TABLET(40 MG) BY MOUTH DAILY   ONETOUCH ULTRA test strip USE AS DIRECTED TWICE DAILY   Polyethyl Glycol-Propyl Glycol (SYSTANE OP) Place 1 drop  into both eyes 2 (two) times daily.   polyethylene glycol (MIRALAX / GLYCOLAX) packet Take 17 g by mouth daily.    Past Medical History:  Diagnosis Date   Allergy    Anemia    Blood transfusion without reported diagnosis    Breast cancer ([place]) 11/2018   Right breast invasive mammary carcinoma   Cancer ([place])    Cataract    Chronic kidney disease    Diabetes mellitus without complication ([place])    Esophageal ulcer with bleeding 05/05/2013   05/05/2013 EGD linear distal esophageal ulcer - cause of hematemesis prior to EGD    GERD (gastroesophageal reflux disease)    History of kidney stones    Hyperlipidemia    Hypertension    Kidney cysts    Personal history of radiation therapy    Sarcoidosis 1970   Past Surgical History:  Procedure Laterality Date   ABDOMINAL HYSTERECTOMY     BREAST BIOPSY Left 07/09/2021   BREAST EXCISIONAL BIOPSY Left 11/2018   Fibroadenoma   BREAST LUMPECTOMY     BREAST LUMPECTOMY WITH RADIOACTIVE SEED AND SENTINEL LYMPH NODE BIOPSY Bilateral 11/25/2018   Procedure: BILATERAL BREAST LUMPECTOMIES  WITH BILATERAL  RADIOACTIVE SEEDS AND RIGHT SENTINEL LYMPH NODE BIOPSY;  Surgeon: [name], MD;  Location: [facility];  Service: General;  Laterality: Bilateral;   CATARACT EXTRACTION W/ INTRAOCULAR LENS IMPLANT     COLONOSCOPY     COLONOSCOPY, ESOPHAGOGASTRODUODENOSCOPY (EGD) AND ESOPHAGEAL DILATION N/A 05/05/2013   Procedure: COLONOSCOPY, ESOPHAGOGASTRODUODENOSCOPY (EGD) AND ESOPHAGEAL DILATION (ED);  Surgeon: Gatha Mayer, MD;  Location: [facility];  Service: Endoscopy;  Laterality: N/A;   LEFT HEART CATH AND CORONARY ANGIOGRAPHY N/A 04/08/2021   Procedure: LEFT HEART CATH AND CORONARY ANGIOGRAPHY;  Surgeon: Sherren Mocha, MD;  Location: [place] [facility];  Service: Cardiovascular;  Laterality: N/A;   LITHOTRIPSY     POLYPECTOMY     UPPER GASTROINTESTINAL ENDOSCOPY     Social History   Social History Narrative   Lives alone   family history includes  Asthma in her brother; CAD in her maternal grandmother; Cancer (age of onset: 17) in her brother and brother; Colon polyps in her brother and sister; Diabetes in her sister; Early death in her mother; Emphysema in her brother; Heart disease in her brother; Kidney failure in her sister; Stroke in her brother and father.   Review of Systems As above  Objective:   Physical Exam '@BP'$  (!) 140/76   Pulse 69   Ht '5\' 2"'$  (1.575 m)   Wt 114 lb (51.7 kg)   SpO2 99%   BMI 20.85 kg/m @  General:  NAD thin bw Eyes:   anicteric Lungs:  clear Heart::  S1S2 no rubs, murmurs or gallops Abdomen:  soft and nontender, BS+ Ext:   no edema, cyanosis or clubbing    Data Reviewed:  See HPI

## 2021-10-28 ENCOUNTER — Ambulatory Visit (HOSPITAL_COMMUNITY)
Admission: RE | Admit: 2021-10-28 | Discharge: 2021-10-28 | Disposition: A | Discharge status: Home / Self Care | Payer: Medicare Other | Source: Ambulatory Visit | Attending: Internal Medicine | Admitting: Internal Medicine

## 2021-10-28 DIAGNOSIS — R933 Abnormal findings on diagnostic imaging of other parts of digestive tract: Secondary | ICD-10-CM

## 2021-10-28 DIAGNOSIS — N3289 Other specified disorders of bladder: Secondary | ICD-10-CM | POA: Diagnosis not present

## 2021-10-28 DIAGNOSIS — N281 Cyst of kidney, acquired: Secondary | ICD-10-CM | POA: Diagnosis not present

## 2021-10-28 DIAGNOSIS — R634 Abnormal weight loss: Secondary | ICD-10-CM | POA: Diagnosis not present

## 2021-10-28 DIAGNOSIS — R1909 Other intra-abdominal and pelvic swelling, mass and lump: Secondary | ICD-10-CM | POA: Diagnosis not present

## 2021-10-28 LAB — POCT I-STAT CREATININE: Creatinine, Ser: 0.8 mg/dL (ref 0.44–1.00)

## 2021-10-28 MED ORDER — IOHEXOL 300 MG/ML  SOLN
100.0000 mL | Freq: Once | INTRAMUSCULAR | Status: AC | PRN
  Administered 2021-10-28: 100 mL via INTRAVENOUS

## 2021-10-29 ENCOUNTER — Ambulatory Visit (INDEPENDENT_AMBULATORY_CARE_PROVIDER_SITE_OTHER): Payer: Medicare Other

## 2021-10-29 VITALS — BP 130/70 | HR 83 | Temp 98.4°F

## 2021-10-29 DIAGNOSIS — Z23 Encounter for immunization: Secondary | ICD-10-CM | POA: Diagnosis not present

## 2021-10-29 NOTE — Progress Notes (Signed)
Patient presents today for 2nd shingles vaccine.   Pt states that she does not feel 100% today. I asked the patient to reschedule to a later date and explained to her that not feeling well when taking the Shingles vaccine could cause her to have a shingles outbreak. Patient is persistent about getting vaccine today.

## 2021-11-05 ENCOUNTER — Telehealth: Payer: Self-pay | Admitting: Internal Medicine

## 2021-11-05 NOTE — Telephone Encounter (Signed)
Patient is returning your call for results °

## 2021-11-05 NOTE — Telephone Encounter (Signed)
Spoke with pt. Documented under results notes:

## 2021-11-07 ENCOUNTER — Other Ambulatory Visit: Payer: Self-pay

## 2021-11-07 DIAGNOSIS — E1169 Type 2 diabetes mellitus with other specified complication: Secondary | ICD-10-CM

## 2021-11-07 MED ORDER — GLIMEPIRIDE 4 MG PO TABS: ORAL_TABLET | ORAL | 1 refills | Status: DC

## 2021-11-26 ENCOUNTER — Other Ambulatory Visit: Payer: Self-pay | Admitting: Nurse Practitioner

## 2021-11-26 NOTE — Progress Notes (Signed)
This nurse received a call from this patient who stated that she attempted to schedule her appointment with [facility] to have her Mammogram and [place] completed and was advised she did not have an order for them.  This nurse then reached out to [facility] who advised this nurse that they needed signed orders.  This nurse faxed signed orders to  [phone] which was provided by [facility].  No further concerns noted at this time.

## 2021-11-27 ENCOUNTER — Ambulatory Visit (INDEPENDENT_AMBULATORY_CARE_PROVIDER_SITE_OTHER): Payer: Medicare Other | Admitting: Nurse Practitioner

## 2021-11-27 ENCOUNTER — Encounter: Payer: Self-pay | Admitting: Nurse Practitioner

## 2021-11-27 VITALS — BP 142/70 | HR 78 | Temp 98.1°F | Ht 62.0 in | Wt 121.0 lb

## 2021-11-27 DIAGNOSIS — I119 Hypertensive heart disease without heart failure: Secondary | ICD-10-CM

## 2021-11-27 DIAGNOSIS — I739 Peripheral vascular disease, unspecified: Secondary | ICD-10-CM

## 2021-11-27 DIAGNOSIS — I10 Essential (primary) hypertension: Secondary | ICD-10-CM | POA: Diagnosis not present

## 2021-11-27 DIAGNOSIS — E782 Mixed hyperlipidemia: Secondary | ICD-10-CM

## 2021-11-27 DIAGNOSIS — I25119 Atherosclerotic heart disease of native coronary artery with unspecified angina pectoris: Secondary | ICD-10-CM

## 2021-11-27 DIAGNOSIS — E1151 Type 2 diabetes mellitus with diabetic peripheral angiopathy without gangrene: Secondary | ICD-10-CM | POA: Diagnosis not present

## 2021-11-27 DIAGNOSIS — R5383 Other fatigue: Secondary | ICD-10-CM | POA: Diagnosis not present

## 2021-11-27 DIAGNOSIS — E559 Vitamin D deficiency, unspecified: Secondary | ICD-10-CM | POA: Diagnosis not present

## 2021-11-27 MED ORDER — GLIMEPIRIDE 4 MG PO TABS: ORAL_TABLET | ORAL | 1 refills | Status: DC

## 2021-11-27 NOTE — Patient Instructions (Signed)

## 2021-11-27 NOTE — Progress Notes (Signed)
I,Tianna Badgett,acting as a Education administrator for [employer], FNP.,have documented all relevant documentation on the behalf of [name], FNP,as directed by  [name], FNP while in the presence of [name], [place].  Subjective:     Patient ID: Elizabeth Mcfarland , female    DOB: 06-25-1940 , 81 y.o.   MRN: [id number]   Chief Complaint  Patient presents with   Diabetes   Hypertension    HPI  Patient is here for dm check. Patient reports that she stopped her Jardiance about two weeks ago due to side effects - she feels her weight is going down due to the [place]. She feels like she was not feeling well. She feels like once stopped taking her energy level improved. She was also not urinating as much. States "I am feeling much better since stopping those pills."  Wt Readings from Last 3 Encounters: 11/27/21 : 121 lb (54.9 kg) 10/24/21 : 114 lb (51.7 kg) 10/21/21 : 117 lb 4.8 oz (53.2 kg)  Her blood sugars have increased since stopping the Jardiance with a high of 174 this morning after eating last night due to being hungry.    Diabetes She presents for her follow-up diabetic visit. She has type 2 diabetes mellitus. Her disease course has been stable. There are no hypoglycemic associated symptoms. Pertinent negatives for hypoglycemia include no dizziness or headaches. Pertinent negatives for diabetes include no chest pain, no fatigue, no polydipsia, no polyphagia and no polyuria. There are no hypoglycemic complications. Symptoms are stable. There are no diabetic complications. Risk factors for coronary artery disease include hypertension, sedentary lifestyle and dyslipidemia. Current diabetic treatment includes oral agent (dual therapy). She is compliant with treatment all of the time. Her weight is stable. She is following a generally healthy diet. When asked about meal planning, she reported none. She participates in exercise daily (gardening). (Blood sugars was 210 last night, she took her  glyburide at bedtime and her blood sugar this morning was 92 after taking a 1/2 tablet) An ACE inhibitor/angiotensin II receptor blocker is being taken. Eye exam is current.  Hypertension This is a chronic problem. The current episode started more than 1 year ago. The problem is unchanged. Pertinent negatives include no chest pain, headaches or palpitations. Past treatments include ACE inhibitors. The current treatment provides significant improvement.     Past Medical History:  Diagnosis Date   Allergy    Anemia    Blood transfusion without reported diagnosis    Breast cancer ([place]) 11/2018   Right breast invasive mammary carcinoma   Cataract    Chronic kidney disease    Diabetes mellitus without complication (HCC)    Esophageal ulcer with bleeding 05/05/2013   05/05/2013 EGD linear distal esophageal ulcer - cause of hematemesis prior to EGD    GERD (gastroesophageal reflux disease)    History of kidney stones    Hyperlipidemia    Hypertension    Kidney cysts    Personal history of radiation therapy    Sarcoidosis 1970     Family History  Problem Relation Age of Onset   Early death Mother    Stroke Father    Diabetes Sister    Kidney failure Sister    Colon polyps Sister    Stroke Brother    Colon polyps Brother    Cancer Brother 22       prostate cancer   Heart disease Brother    Emphysema Brother    Cancer Brother 79  prostate cancer   Asthma Brother    CAD Maternal Grandmother    Breast cancer Neg Hx    Colon cancer Neg Hx    Esophageal cancer Neg Hx    Rectal cancer Neg Hx    Stomach cancer Neg Hx      Current Outpatient Medications:    acetaminophen (TYLENOL) 500 MG tablet, Take 1,000 mg by mouth every 6 (six) hours as needed for mild pain or moderate pain., Disp: , Rfl:    amLODipine (NORVASC) 10 MG tablet, Take 1 tablet (10 mg total) by mouth daily., Disp: 90 tablet, Rfl: 1   anastrozole (ARIMIDEX) 1 MG tablet, TAKE 1 TABLET(1 MG) BY MOUTH DAILY, Disp:  30 tablet, Rfl: 3   Ascorbic Acid (VITAMIN C) 1000 MG tablet, Take 1,000 mg by mouth daily., Disp: , Rfl:    aspirin EC 81 MG tablet, Take 81 mg by mouth daily., Disp: , Rfl:    atorvastatin (LIPITOR) 80 MG tablet, Take 1 tablet (80 mg total) by mouth daily., Disp: 90 tablet, Rfl: 3   cholecalciferol (VITAMIN D) 1000 UNITS tablet, Take 1,000 Units by mouth daily. , Disp: , Rfl:    clopidogrel (PLAVIX) 75 MG tablet, Take 1 tablet (75 mg total) by mouth daily with breakfast., Disp: 90 tablet, Rfl: 3   FLUZONE HIGH-DOSE QUADRIVALENT 0.7 ML SUSY, , Disp: , Rfl:    glimepiride (AMARYL) 4 MG tablet, TAKE 1 TABLET BY MOUTH EVERY MORNING, Disp: 90 tablet, Rfl: 1   Multiple Vitamin (MULTIVITAMIN WITH MINERALS) TABS tablet, Take 1 tablet by mouth daily. , Disp: , Rfl:    nitroGLYCERIN (NITROSTAT) 0.4 MG SL tablet, Place 1 tablet (0.4 mg total) under the tongue every 5 (five) minutes as needed for chest pain. (Patient not taking: Reported on 08/30/2021), Disp: 25 tablet, Rfl: 1   olmesartan (BENICAR) 40 MG tablet, TAKE 1 TABLET(40 MG) BY MOUTH DAILY, Disp: 90 tablet, Rfl: 1   ONETOUCH ULTRA test strip, USE AS DIRECTED TWICE DAILY, Disp: 100 strip, Rfl: 3   Polyethyl Glycol-Propyl Glycol (SYSTANE OP), Place 1 drop into both eyes 2 (two) times daily., Disp: , Rfl:    polyethylene glycol (MIRALAX / GLYCOLAX) packet, Take 17 g by mouth daily. , Disp: , Rfl:    Allergies  Allergen Reactions   Codeine Nausea And Vomiting   Percocet [Oxycodone-Acetaminophen] Nausea And Vomiting     Review of Systems  Constitutional: Negative.  Negative for fatigue.  Respiratory: Negative.    Cardiovascular: Negative.  Negative for chest pain and palpitations.  Gastrointestinal: Negative.   Endocrine: Negative for polydipsia, polyphagia and polyuria.  Neurological: Negative.  Negative for dizziness and headaches.  Psychiatric/Behavioral: Negative.       Today's Vitals   11/27/21 0901 11/27/21 0912  BP: (!) 160/88 (!)  142/70  Pulse: 78   Temp: 98.1 F (36.7 C)   TempSrc: Oral   Weight: 121 lb (54.9 kg)   Height: '5\' 2"'  (1.575 m)    Body mass index is 22.13 kg/m.   Objective:  Physical Exam Vitals reviewed.  Constitutional:      General: She is not in acute distress.    Appearance: Normal appearance. She is well-developed.  HENT:     Right Ear: Hearing normal.     Left Ear: Hearing normal.  Eyes:     General: Lids are normal.     Funduscopic exam:    Right eye: No papilledema.        Left eye: No  papilledema.  Neck:     Thyroid: No thyroid mass.     Vascular: No carotid bruit.  Cardiovascular:     Rate and Rhythm: Normal rate and regular rhythm.     Pulses: Normal pulses.     Heart sounds: Normal heart sounds. No murmur heard. Pulmonary:     Effort: Pulmonary effort is normal. No respiratory distress.     Breath sounds: Normal breath sounds. No wheezing.  Chest:     Chest wall: No mass.  Breasts:    Tanner Score is 5.     Right: Normal. No mass or tenderness.     Left: Normal. No mass or tenderness.  Musculoskeletal:     Cervical back: Full passive range of motion without pain, normal range of motion and neck supple.  Lymphadenopathy:     Upper Body:     Right upper body: No supraclavicular, axillary or pectoral adenopathy.     Left upper body: No supraclavicular, axillary or pectoral adenopathy.  Skin:    General: Skin is warm and dry.     Capillary Refill: Capillary refill takes less than 2 seconds.  Neurological:     General: No focal deficit present.     Mental Status: She is alert and oriented to person, place, and time.     Cranial Nerves: No cranial nerve deficit.     Sensory: No sensory deficit.     Motor: No weakness.  Psychiatric:        Mood and Affect: Mood normal.        Behavior: Behavior normal.        Thought Content: Thought content normal.        Judgment: Judgment normal.         Assessment And Plan:     1. Type 2 diabetes mellitus with diabetic  peripheral angiopathy without gangrene, without long-term current use of insulin (HCC) Comments: Patient stopped her Jardiance due to side efffects, will check levels today and see what other considerations we have - Hemoglobin A1c - BMP8+EGFR - glimepiride (AMARYL) 4 MG tablet; TAKE 1 TABLET BY MOUTH EVERY MORNING  Dispense: 90 tablet; Refill: 1  2. Essential hypertension Comments: Blood pressure improved after rechecking today - BMP8+EGFR  3. Mixed hyperlipidemia Comments: Stable, continue statin, tolerating well.  4. PAD (peripheral artery disease) (HCC) Comments: Continue statin, tolerating well   5. Atherosclerosis of native coronary artery of native heart with angina pectoris ([place]) Comments: Continue statin, tolerating well.   6. Other fatigue Comments: This has been ongoing will check her vitamin B12 level. She has been to Cardiology and not to be found to have a problem - VITAMIN D 25 Hydroxy (Vit-D Deficiency, Fractures) - Vitamin B12     Patient was given opportunity to ask questions. Patient verbalized understanding of the plan and was able to repeat key elements of the plan. All questions were answered to their satisfaction.  [name], FNP   I, [name], FNP, have reviewed all documentation for this visit. The documentation on 11/27/21 for the exam, diagnosis, procedures, and orders are all accurate and complete.   IF YOU HAVE BEEN REFERRED TO A SPECIALIST, IT MAY TAKE 1-2 WEEKS TO SCHEDULE/PROCESS THE REFERRAL. IF YOU HAVE NOT HEARD FROM US/SPECIALIST IN TWO WEEKS, PLEASE GIVE [place] A CALL AT [phone].   THE PATIENT IS ENCOURAGED TO PRACTICE SOCIAL DISTANCING DUE TO THE COVID-19 PANDEMIC.

## 2021-11-28 LAB — BMP8+EGFR
BUN/Creatinine Ratio: 12 (ref 12–28)
BUN: 9 mg/dL (ref 8–27)
CO2: 26 mmol/L (ref 20–29)
Calcium: 9.7 mg/dL (ref 8.7–10.3)
Chloride: 101 mmol/L (ref 96–106)
Creatinine, Ser: 0.76 mg/dL (ref 0.57–1.00)
Glucose: 224 mg/dL — ABNORMAL HIGH (ref 70–99)
Potassium: 4.3 mmol/L (ref 3.5–5.2)
Sodium: 138 mmol/L (ref 134–144)
eGFR: 80 mL/min/{1.73_m2} (ref >59)

## 2021-11-28 LAB — HEMOGLOBIN A1C
Est. average glucose Bld gHb Est-mCnc: 157 mg/dL
Hgb A1c MFr Bld: 7.1 % — ABNORMAL HIGH (ref 4.8–5.6)

## 2021-11-28 LAB — VITAMIN D 25 HYDROXY (VIT D DEFICIENCY, FRACTURES): Vit D, 25-Hydroxy: 42 ng/mL (ref 30.0–100.0)

## 2021-11-28 LAB — VITAMIN B12: Vitamin B-12: >2000 pg/mL — ABNORMAL HIGH (ref 232–1245)

## 2021-11-29 ENCOUNTER — Other Ambulatory Visit: Payer: Self-pay | Admitting: Nurse Practitioner

## 2021-12-10 ENCOUNTER — Other Ambulatory Visit: Payer: Self-pay | Admitting: Nurse Practitioner

## 2021-12-10 DIAGNOSIS — Z1231 Encounter for screening mammogram for malignant neoplasm of breast: Secondary | ICD-10-CM

## 2021-12-30 ENCOUNTER — Ambulatory Visit: Payer: Medicare Other | Admitting: Nurse Practitioner

## 2022-01-07 ENCOUNTER — Ambulatory Visit
Admission: RE | Admit: 2022-01-07 | Discharge: 2022-01-07 | Disposition: A | Discharge status: Home / Self Care | Payer: Medicare Other | Source: Ambulatory Visit | Attending: Nurse Practitioner | Admitting: Nurse Practitioner

## 2022-01-07 ENCOUNTER — Ambulatory Visit: Payer: Medicare Other

## 2022-01-07 DIAGNOSIS — Z1231 Encounter for screening mammogram for malignant neoplasm of breast: Secondary | ICD-10-CM

## 2022-01-08 ENCOUNTER — Telehealth: Payer: Medicare Other | Admitting: Internal Medicine

## 2022-01-08 VITALS — BP 140/72 | HR 69 | Ht 62.0 in | Wt 119.0 lb

## 2022-01-08 DIAGNOSIS — R933 Abnormal findings on diagnostic imaging of other parts of digestive tract: Secondary | ICD-10-CM | POA: Diagnosis not present

## 2022-01-08 DIAGNOSIS — R634 Abnormal weight loss: Secondary | ICD-10-CM | POA: Diagnosis not present

## 2022-01-08 NOTE — Patient Instructions (Signed)
Good to see you today.  Due to recent changes in healthcare laws, you may see the results of your imaging and laboratory studies on MyChart before your provider has had a chance to review them.  We understand that in some cases there may be results that are confusing or concerning to you. Not all laboratory results come back in the same time frame and the provider may be waiting for multiple results in order to interpret others.  Please give [place] 48 hours in order for your provider to thoroughly review all the results before contacting the office for clarification of your results.   I appreciate the opportunity to care for you. Silvano Rusk, MD, [facility]

## 2022-01-08 NOTE — Progress Notes (Signed)
Elizabeth Mcfarland 81 y.o. June 25, 1940 [id number]  Assessment & Plan:   Encounter Diagnoses  Name Primary?   Loss of weight Yes   Abnormal CT scan, gastrointestinal tract - pancreatic duct prominent    Weight has stabilized.  Pancreatic duct is prominent but stable without signs of tumor.  Turns out she did have a boil in the right groin that spontaneously ruptured and that explains the subcutaneous finding on CT scan.  She will follow-up as needed.    Subjective:   Chief Complaint: Weight loss and abnormal CT  HPI Patient is a 81 year old [place] woman here for follow-up of weight loss and abnormal CT.  I saw her in August and she did a CT scan for loss of weight and prominent pancreatic duct on prior CT and that was all stable i.e. pancreatic duct etc. but there was a subcutaneous lesion suspected in the right groin.  We called her about that and said she did not have a problem but as it turns out she said she did have a boil there that spontaneously ruptured and resolved.  Her weight has stabilized.  Appetite is okay.  She has had some heartburn and indigestion with excessive consumption of collards and cabbage and she has reduced that with benefit. Allergies  Allergen Reactions   Codeine Nausea And Vomiting   Percocet [Oxycodone-Acetaminophen] Nausea And Vomiting   Current Meds  Medication Sig   acetaminophen (TYLENOL) 500 MG tablet Take 1,000 mg by mouth every 6 (six) hours as needed for mild pain or moderate pain.   amLODipine (NORVASC) 10 MG tablet Take 1 tablet (10 mg total) by mouth daily.   anastrozole (ARIMIDEX) 1 MG tablet TAKE 1 TABLET(1 MG) BY MOUTH DAILY   Ascorbic Acid (VITAMIN C) 1000 MG tablet Take 1,000 mg by mouth daily.   aspirin EC 81 MG tablet Take 81 mg by mouth daily.   atorvastatin (LIPITOR) 80 MG tablet Take 1 tablet (80 mg total) by mouth daily.   cholecalciferol (VITAMIN D) 1000 UNITS tablet Take 1,000 Units by mouth daily.    clopidogrel  (PLAVIX) 75 MG tablet Take 1 tablet (75 mg total) by mouth daily with breakfast.   FLUZONE HIGH-DOSE QUADRIVALENT 0.7 ML SUSY    glimepiride (AMARYL) 4 MG tablet TAKE 1 TABLET BY MOUTH EVERY MORNING   Multiple Vitamin (MULTIVITAMIN WITH MINERALS) TABS tablet Take 1 tablet by mouth daily.    nitroGLYCERIN (NITROSTAT) 0.4 MG SL tablet Place 1 tablet (0.4 mg total) under the tongue every 5 (five) minutes as needed for chest pain.   olmesartan (BENICAR) 40 MG tablet TAKE 1 TABLET(40 MG) BY MOUTH DAILY   ONETOUCH ULTRA test strip USE AS DIRECTED TWICE DAILY   Polyethyl Glycol-Propyl Glycol (SYSTANE OP) Place 1 drop into both eyes 2 (two) times daily.   polyethylene glycol (MIRALAX / GLYCOLAX) packet Take 17 g by mouth daily.    Past Medical History:  Diagnosis Date   Allergy    Anemia    Blood transfusion without reported diagnosis    Breast cancer ([place]) 11/2018   Right breast invasive mammary carcinoma   Cataract    Chronic kidney disease    Diabetes mellitus without complication ([place])    Esophageal ulcer with bleeding 05/05/2013   05/05/2013 EGD linear distal esophageal ulcer - cause of hematemesis prior to EGD    GERD (gastroesophageal reflux disease)    History of kidney stones    Hyperlipidemia    Hypertension  Kidney cysts    Personal history of radiation therapy    Sarcoidosis 1970   Past Surgical History:  Procedure Laterality Date   ABDOMINAL HYSTERECTOMY     BREAST BIOPSY Left 07/09/2021   BREAST EXCISIONAL BIOPSY Left 11/2018   Fibroadenoma   BREAST LUMPECTOMY     BREAST LUMPECTOMY WITH RADIOACTIVE SEED AND SENTINEL LYMPH NODE BIOPSY Bilateral 11/25/2018   Procedure: BILATERAL BREAST LUMPECTOMIES  WITH BILATERAL  RADIOACTIVE SEEDS AND RIGHT SENTINEL LYMPH NODE BIOPSY;  Surgeon: [name], MD;  Location: [facility];  Service: General;  Laterality: Bilateral;   CATARACT EXTRACTION W/ INTRAOCULAR LENS IMPLANT     COLONOSCOPY     COLONOSCOPY, ESOPHAGOGASTRODUODENOSCOPY  (EGD) AND ESOPHAGEAL DILATION N/A 05/05/2013   Procedure: COLONOSCOPY, ESOPHAGOGASTRODUODENOSCOPY (EGD) AND ESOPHAGEAL DILATION (ED);  Surgeon: Gatha Mayer, MD;  Location: [facility];  Service: Endoscopy;  Laterality: N/A;   LEFT HEART CATH AND CORONARY ANGIOGRAPHY N/A 04/08/2021   Procedure: LEFT HEART CATH AND CORONARY ANGIOGRAPHY;  Surgeon: Sherren Mocha, MD;  Location: [facility];  Service: Cardiovascular;  Laterality: N/A;   LITHOTRIPSY     POLYPECTOMY     UPPER GASTROINTESTINAL ENDOSCOPY     Social History   Social History Narrative   Lives alone   family history includes Asthma in her brother; CAD in her maternal grandmother; Cancer (age of onset: 8) in her brother and brother; Colon polyps in her brother and sister; Diabetes in her sister; Early death in her mother; Emphysema in her brother; Heart disease in her brother; Kidney failure in her sister; Stroke in her brother and father.   Review of Systems As above  Objective:   Physical Exam BP (!) 140/72   Pulse 69   Ht '5\' 2"'$  (1.575 m)   Wt 119 lb (54 kg)   BMI 21.77 kg/m  The right groin is without lesion or lymphadenopathy  Data reviewed include prior weights, CT scan

## 2022-01-13 ENCOUNTER — Telehealth: Payer: Self-pay

## 2022-01-13 NOTE — Progress Notes (Signed)
    Elizabeth Mcfarland was reminded to have all medications, supplements and any blood glucose and blood pressure readings available for review with [name], Pharm. D, at her telephone visit on 01-14-2022 at 10:15.   Questions: Have you had any recent office visit or specialist visit outside of [facility]? Patient stated no  Are there any concerns you would like to discuss during your office visit? Patient stated B12 levels being elevated.  Are you having any problems obtaining your medications? No   If patient has any PAP medications ask if they are having any problems getting their PAP medication or refill? No PAP meds  Care Gaps: Covid booster overdue Flu vaccine overdue  Star Rating Drug: Atorvastatin 80 mg- Last filled 10-14-2021 90 DS [facility]  Jardiance 10 mg- Last filled 10-09-2021 30 DS [facility] ([facility]). Patient reported not taking Glimepiride 4 mg- Last filled 11-07-2021 90 DS [facility] Olmesartan 40 mg- Last filled 12-29-2021 90 DS [facility]   Any gaps in medications fill history? No  [facility] Pharmacist Assistant [phone]

## 2022-01-14 ENCOUNTER — Ambulatory Visit (INDEPENDENT_AMBULATORY_CARE_PROVIDER_SITE_OTHER): Payer: Medicare Other

## 2022-01-14 DIAGNOSIS — E1151 Type 2 diabetes mellitus with diabetic peripheral angiopathy without gangrene: Secondary | ICD-10-CM

## 2022-01-14 DIAGNOSIS — I10 Essential (primary) hypertension: Secondary | ICD-10-CM

## 2022-01-14 NOTE — Progress Notes (Signed)
Chronic Care Management Pharmacy Note  01/15/2022 Name:  Elizabeth Mcfarland MRN:  [id number] DOB:  09/07/1940  Summary: Patient reports that she has some concerns about her health including her diabetes regimen.   Recommendations/Changes made from today's visit: Recommend Elizabeth Mcfarland take her medication using the directions written by the PCP team. Recommend receiving COVID-19 vaccine and flu shot.  Recommend patient stop taking vitamin that has over 100% vitamin B Recommend patient be seen by PCP team   Plan: Patient reports that she is going to take her diabetes medication following the directions that are written.  Discontinued multivitamin that contains vitamin B  Patient reports that she is going to get the vaccine during her lab visit on 02/04/2022, collaborated with PCP team for patient to have flu shot scheduled.  Collaborate with PCP team to schedule patient to be seen by Dr. Baird Cancer.    Subjective: Elizabeth Mcfarland is an 81 y.o. year old female who is a primary patient of [name], [place].  The CCM team was consulted for assistance with disease management and care coordination needs.    Engaged with patient by telephone for follow up visit in response to provider referral for pharmacy case management and/or care coordination services.   Consent to Services:  The patient was given information about Chronic Care Management services, agreed to services, and gave verbal consent prior to initiation of services.  Please see initial visit note for detailed documentation.   Patient Care Team: [name], [place] as PCP - General (General Practice) [name], [name], MD as PCP - Cardiology (Cardiology) [name], RN as Oncology Nurse Navigator [name], RN as Oncology Nurse Navigator [name], MD as Consulting Physician (Hematology) [name], MD as Consulting Physician (General Surgery) [name], MD as Consulting Physician (Radiation Oncology) Alla Feeling,  NP as Nurse Practitioner (Nurse Practitioner) Mayford Knife, [facility] (Pharmacist)  Recent office visits: 11/27/2021 PCP OV  Recent consult visits: 10/24/2021 Gertie Fey OV 10/09/2021 [facility] visits: None in previous 6 months   Objective:  Lab Results  Component Value Date   CREATININE 0.76 11/27/2021   BUN 9 11/27/2021   EGFR 80 11/27/2021   GFRNONAA >60 10/21/2021   GFRAA 64 12/20/2019   NA 138 11/27/2021   K 4.3 11/27/2021   CALCIUM 9.7 11/27/2021   CO2 26 11/27/2021   GLUCOSE 224 (H) 11/27/2021    Lab Results  Component Value Date/Time   HGBA1C 7.1 (H) 11/27/2021 10:21 AM   HGBA1C 7.0 (H) 08/27/2021 10:49 AM   MICROALBUR 30 07/14/2019 10:45 AM   MICROALBUR 10 07/08/2018 11:09 AM    Last diabetic Eye exam:  Lab Results  Component Value Date/Time   HMDIABEYEEXA No Retinopathy 02/05/2021 12:00 AM    Last diabetic Foot exam: No results found for: "HMDIABFOOTEX"   Lab Results  Component Value Date   CHOL 173 08/27/2021   HDL 97 08/27/2021   LDLCALC 68 08/27/2021   TRIG 34 08/27/2021   CHOLHDL 1.8 08/27/2021       Latest Ref Rng & Units 10/21/2021    9:14 AM 08/27/2021   10:49 AM 04/05/2021   10:26 AM  Hepatic Function  Total Protein 6.5 - 8.1 g/dL 7.0  7.1  6.7   Albumin 3.5 - 5.0 g/dL 4.3  4.8  4.0   AST 15 - 41 U/L 29  45  24   ALT 0 - 44 U/L 33  66  23  Alk Phosphatase 38 - 126 U/L 66  80  70   Total Bilirubin 0.3 - 1.2 mg/dL 0.7  0.7  0.8     Lab Results  Component Value Date/Time   TSH 1.040 08/30/2021 10:45 AM   TSH 1.150 05/31/2020 10:59 AM       Latest Ref Rng & Units 10/21/2021    9:14 AM 08/27/2021   10:49 AM 04/09/2021    3:42 AM  CBC  WBC 4.0 - 10.5 K/uL 5.3  4.9  6.3   Hemoglobin 12.0 - 15.0 g/dL 12.6  13.2  11.2   Hematocrit 36.0 - 46.0 % 38.5  39.8  33.6   Platelets 150 - 400 K/uL 203  225  218     Lab Results  Component Value Date/Time   VD25OH 42.0 11/27/2021 10:21 AM   VD25OH 47.8 07/14/2019 12:38 PM     Clinical ASCVD: No  The 10-year ASCVD risk score (Arnett DK, et al., 2019) is: 49.1%   Values used to calculate the score:     Age: 81 years     Sex: Female     Is Non-Hispanic African American: Yes     Diabetic: Yes     Tobacco smoker: No     Systolic Blood Pressure: 631 mmHg     Is BP treated: Yes     HDL Cholesterol: 97 mg/dL     Total Cholesterol: 173 mg/dL       11/27/2021    9:04 AM 09/02/2021   11:49 AM 06/25/2021    9:54 AM  Depression screen PHQ 2/9  Decreased Interest 0 0 0  Down, Depressed, Hopeless 0 0 0  PHQ - 2 Score 0 0 0  Altered sleeping   0  Tired, decreased energy   0  Change in appetite   0  Feeling bad or failure about yourself    0  Trouble concentrating   0  Moving slowly or fidgety/restless   0  Suicidal thoughts   0  PHQ-9 Score   0  Difficult doing work/chores   Not difficult at all    Social History   Tobacco Use  Smoking Status Never  Smokeless Tobacco Never   BP Readings from Last 3 Encounters:  01/08/22 (!) 140/72  11/27/21 (!) 142/70  10/29/21 130/70   Pulse Readings from Last 3 Encounters:  01/08/22 69  11/27/21 78  10/29/21 83   Wt Readings from Last 3 Encounters:  01/08/22 119 lb (54 kg)  11/27/21 121 lb (54.9 kg)  10/24/21 114 lb (51.7 kg)   BMI Readings from Last 3 Encounters:  01/08/22 21.77 kg/m  11/27/21 22.13 kg/m  10/24/21 20.85 kg/m    Assessment/Interventions: Review of patient past medical history, allergies, medications, health status, including review of consultants reports, laboratory and other test data, was performed as part of comprehensive evaluation and provision of chronic care management services.   SDOH:  (Social Determinants of Health) assessments and interventions performed: No SDOH Interventions    Flowsheet Row Clinical Support from 07/14/2019 in [place] from 07/08/2018 in [facility]  SDOH Interventions    Depression  Interventions/Treatment  PHQ2-9 Score <4 Follow-up Not Indicated PHQ2-9 Score <4 Follow-up Not Indicated      SDOH Screenings   Food Insecurity: No Food Insecurity (06/12/2021)  Transportation Needs: No Transportation Needs (06/12/2021)  Depression (PHQ2-9): Low Risk  (11/27/2021)  Financial Resource Strain: Low Risk  (06/12/2021)  Physical Activity: Insufficiently Active (  06/12/2021)  Stress: No Stress Concern Present (06/12/2021)  Tobacco Use: Low Risk  (01/07/2022)    CCM Care Plan  Allergies  Allergen Reactions   Codeine Nausea And Vomiting   Percocet [Oxycodone-Acetaminophen] Nausea And Vomiting    Medications Reviewed Today     Reviewed by Mayford Knife, RPH (Pharmacist) on 01/14/22 at 1022  Med List Status: <None>   Medication Order Taking? Sig Documenting Provider Last Dose Status Informant  acetaminophen (TYLENOL) 500 MG tablet [id number]  Take 1,000 mg by mouth every 6 (six) hours as needed for mild pain or moderate pain. [provider]  Active Self  amLODipine (NORVASC) 10 MG tablet [id number]  Take 1 tablet (10 mg total) by mouth daily. [name], FNP  Active   anastrozole (ARIMIDEX) 1 MG tablet [id number]  TAKE 1 TABLET(1 MG) BY MOUTH DAILY Alla Feeling, NP  Active   Ascorbic Acid (VITAMIN C) 1000 MG tablet [id number]  Take 1,000 mg by mouth daily. [provider]  Active Self  aspirin EC 81 MG tablet [id number]  Take 81 mg by mouth daily. [provider]  Active Self  atorvastatin (LIPITOR) 80 MG tablet [id number]  Take 1 tablet (80 mg total) by mouth daily. [name], FNP  Active   cholecalciferol (VITAMIN D) 1000 UNITS tablet [id number]  Take 1,000 Units by mouth daily.  [provider]  Active Self  clopidogrel (PLAVIX) 75 MG tablet [id number]  Take 1 tablet (75 mg total) by mouth daily with breakfast. Margie Billet, PA-C  Active Self  FLUZONE HIGH-DOSE QUADRIVALENT 0.7 ML SUSY [id number]   [provider]  Active  Self  glimepiride (AMARYL) 4 MG tablet [id number]  TAKE 1 TABLET BY MOUTH EVERY MORNING [name], FNP  Active   Multiple Vitamin (MULTIVITAMIN WITH MINERALS) TABS tablet [id number]  Take 1 tablet by mouth daily.  [provider]  Active Self  nitroGLYCERIN (NITROSTAT) 0.4 MG SL tablet [id number]  Place 1 tablet (0.4 mg total) under the tongue every 5 (five) minutes as needed for chest pain. Margie Billet, PA-C  Active Self           Med Note Orma Render   Fri Aug 30, 2021  9:44 AM) Patient has on hand if needed  olmesartan (BENICAR) 40 MG tablet [id number]  TAKE 1 TABLET(40 MG) BY MOUTH DAILY [name], FNP  Active   [facility] ULTRA test strip [id number]  USE AS DIRECTED TWICE DAILY [name], FNP  Active Self  Polyethyl Glycol-Propyl Glycol (SYSTANE OP) [id number]  Place 1 drop into both eyes 2 (two) times daily. [provider]  Active Self  polyethylene glycol (MIRALAX / GLYCOLAX) packet [id number]  Take 17 g by mouth daily.  [provider]  Active Self            Patient Active Problem List   Diagnosis Date Noted   Coronary artery disease involving native coronary artery with angina pectoris ([place]) 04/08/2021   Abnormal cardiac CT angiography 04/08/2021   PAD (peripheral artery disease) ([place]) 09/11/2020   Malignant neoplasm of upper-inner quadrant of right breast in female, estrogen receptor positive ([place]) 10/11/2018   Localized skin eruption 10/07/2018   Breast mass seen on mammogram 10/07/2018   Gastroesophageal reflux disease without esophagitis 10/07/2018   Chest pain 12/23/2015   Essential hypertension    Diabetes mellitus type 2 in nonobese ([place])    Anemia 06/21/2013   Personal history of colonic polyps 05/04/2013   Type II  diabetes mellitus with manifestations, uncontrolled 02/21/2008   HYPERTENSION 02/21/2008   IBS 02/21/2008   NEPHROLITHIASIS, HX OF 02/21/2008    Immunization History  Administered Date(s) Administered    Fluad Quad(high Dose 65+) 12/20/2019   Influenza, High Dose Seasonal PF 01/13/2018, 01/05/2019   Influenza-Unspecified 01/15/2021   PFIZER(Purple Top)SARS-COV-2 Vaccination 05/03/2019, 05/21/2019, 01/04/2020, 07/30/2020   Pfizer Covid-19 Vaccine Bivalent Booster 55yr & up 01/01/2021   Pneumococcal Conjugate-13 04/11/2019   Pneumococcal Polysaccharide-23 09/11/2020   Td 03/24/2010   Tdap 06/12/2021   Zoster Recombinat (Shingrix) 08/27/2021, 10/29/2021    Conditions to be addressed/monitored:  Hypertension and Hyperlipidemia  Care Plan : [place] Updates made by PMayford Knife RRobinsonsince 01/15/2022 12:00 AM     Problem: HTN, DM II      Long-Range Goal: Disease Management   Note:     Current Barriers:  Unable to independently monitor therapeutic efficacy Unable to achieve control of diabetes  Does not adhere to prescribed medication regimen Does not maintain contact with provider office  Pharmacist Clinical Goal(s):  Patient will achieve adherence to monitoring guidelines and medication adherence to achieve therapeutic efficacy through collaboration with PharmD and provider.   Interventions: 1:1 collaboration with MMinette Brine FNP regarding development and update of comprehensive plan of care as evidenced by provider attestation and co-signature Inter-disciplinary care team collaboration (see longitudinal plan of care) Comprehensive medication review performed; medication list updated in electronic medical record  Hypertension (BP goal <130/80) -Uncontrolled -Current treatment: Amlodipine 10 mg tablet once per day Appropriate, Effective, Safe, Accessible Olmesartan 40 mg tablet daily Appropriate, Effective, Safe, Accessible -Medications previously tried: metoprolol tartrate - discontinued by cardiologist   -Current home readings: 10/9 - 132/67, 10/10 -135/76, 10/11 - 146/76, 10/12 - 167/81, 10/13 - 143/73, 10/14 - 136/77, 10/15- 128/73, 10/16 - 153/76,  10/17 - 150/82, 10/23 - 138/63  -Current dietary habits: limit salt intake, and dr -Denies hypotensive/hypertensive symptoms -Educated on BP goals and benefits of medications for prevention of heart attack, stroke and kidney damage; Proper BP monitoring technique; -Counseled to monitor BP at home at least three days per week, document, and provide log at future appointments -Recommended to continue current medication  Diabetes (A1c goal <8%) -Controlled -Current medications: Glimepiride 4 mg tablet - take one tablet daily Appropriate, Query effective, Patient is taking one table twice per day  -Medications previously tried: Jardiance 10 mg tablet - stopped due to side effects and cost- she reports losing too much weight and not feeling good, and now she is feeling better -Current home glucose readings: she is checking her BS using a meter  fasting glucose: 10/24 -99, 10/23 -101, 10/16 -151, the rest of her readings are between 98- 130, 10/18-165 - in the morning  -Denies hypoglycemic/hyperglycemic symptoms -Current meal patterns: will discuss during further during next visit -Current exercise: will discuss during further during next visit -Educated on A1c and blood sugar goals; Complications of diabetes including kidney damage, retinal damage, and cardiovascular disease; Counseled to check feet daily and get yearly eye exams -Patient has concerns because although PCP said she would help her with a new regimen, she has not heard from her since the appointment this made her very concerned because she saw her BS numbers going up and  -We discussed why patient is taking two tablets of glimepiride daily, she reports its because PCP  has not made up her mind yet of what to do with her diabetes.  -Patient reports that she is  okay with trying a different medication as long it does not make her lose weight.  -Counseled to check feet daily and get yearly eye exams -Recommended to continue current  medication -Collaborate with PCP to determine next steps for    Health Maintenance -Vaccine gaps: COVID-19 Vaccine Booster, Influenza vaccine -Current therapy:  Centrum Silver 50 plus- one daily Appropriate, Effective, Query Safe Vitamin C - one daily Appropriate, Effective, Safe, Accessible Calcium + Vitamin D3 - one daily Appropriate, Effective, Safe, Accessible -Educated on Herbal supplement research is limited and benefits usually cannot be proven Supplements may interfere with prescription drugs Patient reports that she was told at one time to take a vitamin, so she has been taking these vitamins for some time. -We reviewed in great detail each vitamin and its ingredients, due to patients elevated Vitamin B levels, of note she has been taking Centrum silver for years.   -Ms. Folger reports that she would like to be referred to a specialist about her Vitamin B levels, she reports that she never told anyone that she did not want to be seen by a specialist.  -She cares too much about her health.  -Patient reports that she will follow up as needed  -Recommended patient stop taking vitamin B containing multivitamin daily due to elevated vitamin B levels Collaborated with PCP and made them aware of patients interest in being seen for her elevated Vitamin B levels.    Patient Goals/Self-Care Activities Patient will:  - take medications as prescribed as evidenced by patient report and record review - We discussed the importance of taking her medication at the same time each day we also discussed in great detail that she can call if she has any questions about her medications or concerns   Follow Up Plan: The patient has been provided with contact information for the care management team and has been advised to call with any health related questions or concerns.       Medication Assistance: None required.  Patient affirms current coverage meets needs.  Compliance/Adherence/Medication fill  history: Care Gaps: COVID-19 Vaccine Booster Influenza Vaccine  Star-Rating Drugs: Atorvastatin 80 mg tablet  Glimepiride 4 mg tablet  Olmesartan 40 mg tablet   Patient's preferred pharmacy is:  [id number] [place] [address] Phone: [phone] Fax: [phone]  [facility] [address] [address] Phone: [phone] Fax: [phone]  Uses pill box? Yes - its a two week reminder system Pt endorses 90% compliance  We discussed: Benefits of medication synchronization, packaging and delivery as well as enhanced pharmacist oversight with [employer]. Patient decided to: Continue current medication management strategy  Care Plan and Follow Up Patient Decision:  Patient agrees to Care Plan and Follow-up.  Plan: The patient has been provided with contact information for the care management team and has been advised to call with any health related questions or concerns.   [name], CPP, PharmD Clinical Pharmacist Practitioner [facility] [phone]

## 2022-01-15 NOTE — Patient Instructions (Signed)
Visit Information It was great speaking with you today!  Please let me know if you have any questions about our visit.   Goals Addressed             This Visit's Progress    Track and Manage My Blood Pressure-Hypertension       Timeframe:  Long-Range Goal Priority:  High Start Date:                             Expected End Date:                       Follow Up Date 03/07/2022   In Progress:  - check blood pressure daily - choose a place to take my blood pressure (home, clinic or office, retail store) - write blood pressure results in a log or diary    Why is this important?   You won't feel high blood pressure, but it can still hurt your blood vessels.  High blood pressure can cause heart or kidney problems. It can also cause a stroke.  Making lifestyle changes like losing a little weight or eating less salt will help.  Checking your blood pressure at home and at different times of the day can help to control blood pressure.  If the doctor prescribes medicine remember to take it the way the doctor ordered.  Call the office if you cannot afford the medicine or if there are questions about it.     Notes:         Patient Care Plan: CCM Pharmacy Care Plan     Problem Identified: HTN, DM II      Long-Range Goal: Disease Management   Note:     Current Barriers:  Unable to independently monitor therapeutic efficacy Unable to achieve control of diabetes  Does not adhere to prescribed medication regimen Does not maintain contact with provider office  Pharmacist Clinical Goal(s):  Patient will achieve adherence to monitoring guidelines and medication adherence to achieve therapeutic efficacy through collaboration with PharmD and provider.   Interventions: 1:1 collaboration with [name], FNP regarding development and update of comprehensive plan of care as evidenced by provider attestation and co-signature Inter-disciplinary care team collaboration (see  longitudinal plan of care) Comprehensive medication review performed; medication list updated in electronic medical record  Hypertension (BP goal <130/80) -Uncontrolled -Current treatment: Amlodipine 10 mg tablet once per day Appropriate, Effective, Safe, Accessible Olmesartan 40 mg tablet daily Appropriate, Effective, Safe, Accessible -Medications previously tried: metoprolol tartrate - discontinued by cardiologist   -Current home readings: 10/9 - 132/67, 10/10 -135/76, 10/11 - 146/76, 10/12 - 167/81, 10/13 - 143/73, 10/14 - 136/77, 10/15- 128/73, 10/16 - 153/76, 10/17 - 150/82, 10/23 - 138/63  -Current dietary habits: limit salt intake, and dr -Denies hypotensive/hypertensive symptoms -Educated on BP goals and benefits of medications for prevention of heart attack, stroke and kidney damage; Proper BP monitoring technique; -Counseled to monitor BP at home at least three days per week, document, and provide log at future appointments -Recommended to continue current medication  Diabetes (A1c goal <8%) -Controlled -Current medications: Glimepiride 4 mg tablet - take one tablet daily Appropriate, Query effective, Patient is taking one table twice per day  -Medications previously tried: Jardiance 10 mg tablet - stopped due to side effects and cost- she reports losing too much weight and not feeling good, and now she is feeling better -Current home glucose readings:  she is checking her BS using a meter  fasting glucose: 10/24 -99, 10/23 -101, 10/16 -151, the rest of her readings are between 98- 130, 10/18-165 - in the morning  -Denies hypoglycemic/hyperglycemic symptoms -Current meal patterns: will discuss during further during next visit -Current exercise: will discuss during further during next visit -Educated on A1c and blood sugar goals; Complications of diabetes including kidney damage, retinal damage, and cardiovascular disease; Counseled to check feet daily and get yearly eye  exams -Patient has concerns because although PCP said she would help her with a new regimen, she has not heard from her since the appointment this made her very concerned because she saw her BS numbers going up and  -We discussed why patient is taking two tablets of glimepiride daily, she reports its because PCP  has not made up her mind yet of what to do with her diabetes.  -Patient reports that she is okay with trying a different medication as long it does not make her lose weight.  -Counseled to check feet daily and get yearly eye exams -Recommended to continue current medication -Collaborate with PCP to determine next steps for    Health Maintenance -Vaccine gaps: COVID-19 Vaccine Booster, Influenza vaccine -Current therapy:  Centrum Silver 50 plus- one daily Appropriate, Effective, Query Safe Vitamin C - one daily Appropriate, Effective, Safe, Accessible Calcium + Vitamin D3 - one daily Appropriate, Effective, Safe, Accessible -Educated on Herbal supplement research is limited and benefits usually cannot be proven Supplements may interfere with prescription drugs Patient reports that she was told at one time to take a vitamin, so she has been taking these vitamins for some time. -We reviewed in great detail each vitamin and its ingredients, due to patients elevated Vitamin B levels, of note she has been taking Centrum silver for years.   -Ms. Willow reports that she would like to be referred to a specialist about her Vitamin B levels, she reports that she never told anyone that she did not want to be seen by a specialist.  -She cares too much about her health.  -Patient reports that she will follow up as needed  -Recommended patient stop taking vitamin B containing multivitamin daily due to elevated vitamin B levels Collaborated with PCP and made them aware of patients interest in being seen for her elevated Vitamin B levels.    Patient Goals/Self-Care Activities Patient will:  - take  medications as prescribed as evidenced by patient report and record review - We discussed the importance of taking her medication at the same time each day we also discussed in great detail that she can call if she has any questions about her medications or concerns   Follow Up Plan: The patient has been provided with contact information for the care management team and has been advised to call with any health related questions or concerns.       Patient agreed to services and verbal consent obtained.   The patient verbalized understanding of instructions, educational materials, and care plan provided today and agreed to receive a mailed copy of patient instructions, educational materials, and care plan.   [name], PharmD Clinical Pharmacist [facility] [phone]

## 2022-01-21 DIAGNOSIS — I10 Essential (primary) hypertension: Secondary | ICD-10-CM | POA: Diagnosis not present

## 2022-01-21 DIAGNOSIS — E1159 Type 2 diabetes mellitus with other circulatory complications: Secondary | ICD-10-CM | POA: Diagnosis not present

## 2022-01-21 DIAGNOSIS — Z7984 Long term (current) use of oral hypoglycemic drugs: Secondary | ICD-10-CM | POA: Diagnosis not present

## 2022-01-23 ENCOUNTER — Other Ambulatory Visit: Payer: Self-pay | Admitting: Nurse Practitioner

## 2022-01-23 DIAGNOSIS — E1151 Type 2 diabetes mellitus with diabetic peripheral angiopathy without gangrene: Secondary | ICD-10-CM

## 2022-01-29 ENCOUNTER — Encounter: Payer: Self-pay | Admitting: Nurse Practitioner

## 2022-01-30 DIAGNOSIS — H401131 Primary open-angle glaucoma, bilateral, mild stage: Secondary | ICD-10-CM | POA: Diagnosis not present

## 2022-02-04 ENCOUNTER — Other Ambulatory Visit: Payer: Self-pay | Admitting: Nurse Practitioner

## 2022-02-04 ENCOUNTER — Other Ambulatory Visit: Payer: Medicare Other

## 2022-02-04 ENCOUNTER — Ambulatory Visit (INDEPENDENT_AMBULATORY_CARE_PROVIDER_SITE_OTHER): Payer: Medicare Other

## 2022-02-04 VITALS — BP 136/68 | HR 75 | Temp 98.7°F | Ht 62.0 in | Wt 119.0 lb

## 2022-02-04 DIAGNOSIS — R7989 Other specified abnormal findings of blood chemistry: Secondary | ICD-10-CM | POA: Diagnosis not present

## 2022-02-04 DIAGNOSIS — Z23 Encounter for immunization: Secondary | ICD-10-CM

## 2022-02-04 DIAGNOSIS — I10 Essential (primary) hypertension: Secondary | ICD-10-CM

## 2022-02-04 MED ORDER — HYDROCHLOROTHIAZIDE 12.5 MG PO TABS: 12.5000 mg | ORAL_TABLET | Freq: Every day | ORAL | 2 refills | Status: DC

## 2022-02-04 NOTE — Progress Notes (Signed)
Patient presents today for a bp check and flu shot, patient is currently taking amLodipine '10mg'$ s at night time, olmesartan '40mg'$ s in the morning. BP Readings from Last 3 Encounters:  02/04/22 (!) 150/70  01/08/22 (!) 140/72  11/27/21 (!) 142/70  Per Provider- add 12.5 mg of hctz tell to take in am will make her urinate f/u in 6 weeks.

## 2022-02-05 LAB — VITAMIN B12: Vitamin B-12: >2000 pg/mL — ABNORMAL HIGH (ref 232–1245)

## 2022-03-05 ENCOUNTER — Telehealth: Payer: Self-pay

## 2022-03-05 NOTE — Progress Notes (Signed)
      Elizabeth Mcfarland was reminded to have all medications, supplements and any blood glucose and blood pressure readings available for review with [name], Pharm. D, at her telephone visit on 03-07-2022 at 10:00.   Questions: Have you had any recent office visit or specialist visit outside of [place]? Patient stated no  Are there any concerns you would like to discuss during your office visit? Patient stated no  Are you having any problems obtaining your medications? (Whether it pharmacy issues or cost) Patient stated no  If patient has any PAP medications ask if they are having any problems getting their PAP medication or refill? No PAP meds  Care Gaps: Covid booster overdue Flu vaccine overdue  Star Rating Drug: Atorvastatin 80 mg- Last filled 01-24-2022 90 DS [facility]. Previous 10-10-2021 90 DS [facility]  Jardiance 10 mg- Last filled 10-09-2021 30 DS [facility] ([facility] Patient reported not taking Glimepiride 4 mg- Last filled 11-07-2021 90 [facility] ([facility]) Olmesartan 40 mg- Last filled 12-29-2021 90 DS [facility]. Previous 09-26-2021 90 DS  Any gaps in medications fill history? Yes  [place] Pharmacist Assistant [phone]

## 2022-03-06 DIAGNOSIS — I10 Essential (primary) hypertension: Secondary | ICD-10-CM | POA: Diagnosis not present

## 2022-03-06 DIAGNOSIS — E1142 Type 2 diabetes mellitus with diabetic polyneuropathy: Secondary | ICD-10-CM | POA: Diagnosis not present

## 2022-03-07 ENCOUNTER — Telehealth: Payer: Self-pay

## 2022-03-07 NOTE — Telephone Encounter (Signed)
  Care Management   Follow Up Note   03/07/2022 Name: Elizabeth Mcfarland MRN: [id number] DOB: 04-23-1940   Referred by: [name], FNP Reason for referral : No chief complaint on file.   An unsuccessful telephone outreach was attempted today. The patient was referred to the case management team for assistance with care management and care coordination.   Follow Up Plan: The patient has been provided with contact information for the care management team and has been advised to call with any health related questions or concerns.   [name], CPP, PharmD Clinical Pharmacist Practitioner [facility] [phone]

## 2022-03-07 NOTE — Progress Notes (Deleted)
Current Barriers:  {pharmacybarriers:24917}  Pharmacist Clinical Goal(s):  Patient will {PHARMACYGOALCHOICES:24921} through collaboration with PharmD and provider.   Interventions: 1:1 collaboration with Moore, Janece, FNP regarding development and update of comprehensive plan of care as evidenced by provider attestation and co-signature Inter-disciplinary care team collaboration (see longitudinal plan of care) Comprehensive medication review performed; medication list updated in electronic medical record  {CCM PHARMD DISEASE STATES:25130}  Patient Goals/Self-Care Activities Patient will:  - {pharmacypatientgoals:24919}  Follow Up Plan: {CM FOLLOW UP PLAN:22241}  Chronic Care Management Pharmacy Note  03/07/2022 Name:  Elizabeth Mcfarland MRN:  [id number] DOB:  11/23/1940  Summary: ***  Recommendations/Changes made from today's visit: ***  Plan: ***   Subjective: Elizabeth Mcfarland is an 81 y.o. year old female who is a primary patient of Moore, Janece, FNP.  The CCM team was consulted for assistance with disease management and care coordination needs.    {CCMTELEPHONEFACETOFACE:21091510} for {CCMINITIALFOLLOWUPCHOICE:21091511} in response to provider referral for pharmacy case management and/or care coordination services.   Consent to Services:  {CCMCONSENTOPTIONS:25074}  Patient Care Team: Moore, Janece, FNP as PCP - General (General Practice) [name], Mihai, MD as PCP - Cardiology (Cardiology) Stuart, Dawn C, RN as Oncology Nurse Navigator Martini, Keisha N, RN as Oncology Nurse Navigator Feng, Yan, MD as Consulting Physician (Hematology) Toth, Paul III, MD as Consulting Physician (General Surgery) Kinard, James, MD as Consulting Physician (Radiation Oncology) Burton, Lacie K, NP as Nurse Practitioner (Nurse Practitioner) Pearson, Vallie J, RPH (Pharmacist)  Recent office visits: ***  Recent consult visits: ***  Hospital visits: {Hospital DC  Yes/No:25215}   Objective:  Lab Results  Component Value Date   CREATININE 0.76 11/27/2021   BUN 9 11/27/2021   EGFR 80 11/27/2021   GFRNONAA >60 10/21/2021   GFRAA 64 12/20/2019   NA 138 11/27/2021   K 4.3 11/27/2021   CALCIUM 9.7 11/27/2021   CO2 26 11/27/2021   GLUCOSE 224 (H) 11/27/2021    Lab Results  Component Value Date/Time   HGBA1C 7.1 (H) 11/27/2021 10:21 AM   HGBA1C 7.0 (H) 08/27/2021 10:49 AM   MICROALBUR 30 07/14/2019 10:45 AM   MICROALBUR 10 07/08/2018 11:09 AM    Last diabetic Eye exam:  Lab Results  Component Value Date/Time   HMDIABEYEEXA No Retinopathy 02/05/2021 12:00 AM    Last diabetic Foot exam: No results found for: "HMDIABFOOTEX"   Lab Results  Component Value Date   CHOL 173 08/27/2021   HDL 97 08/27/2021   LDLCALC 68 08/27/2021   TRIG 34 08/27/2021   CHOLHDL 1.8 08/27/2021       Latest Ref Rng & Units 10/21/2021    9:14 AM 08/27/2021   10:49 AM 04/05/2021   10:26 AM  Hepatic Function  Total Protein 6.5 - 8.1 g/dL 7.0  7.1  6.7   Albumin 3.5 - 5.0 g/dL 4.3  4.8  4.0   AST 15 - 41 U/L 29  45  24   ALT 0 - 44 U/L 33  66  23   Alk Phosphatase 38 - 126 U/L 66  80  70   Total Bilirubin 0.3 - 1.2 mg/dL 0.7  0.7  0.8     Lab Results  Component Value Date/Time   TSH 1.040 08/30/2021 10:45 AM   TSH 1.150 05/31/2020 10:59 AM       Latest Ref Rng & Units 10/21/2021    9:14 AM 08/27/2021   10:49 AM 04/09/2021    3:42 AM  CBC  WBC 4.0 -   10.5 K/uL 5.3  4.9  6.3   Hemoglobin 12.0 - 15.0 g/dL 12.6  13.2  11.2   Hematocrit 36.0 - 46.0 % 38.5  39.8  33.6   Platelets 150 - 400 K/uL 203  225  218     Lab Results  Component Value Date/Time   VD25OH 42.0 11/27/2021 10:21 AM   VD25OH 47.8 07/14/2019 12:38 PM   VITAMINB12 >2000 (H) 02/04/2022 09:10 AM   VITAMINB12 >2000 (H) 11/27/2021 10:21 AM    Clinical ASCVD: {YES/NO:21197} The 10-year ASCVD risk score (Arnett DK, et al., 2019) is: 47.9%   Values used to calculate the score:     Age: 81  years     Sex: Female     Is Non-Hispanic African American: Yes     Diabetic: Yes     Tobacco smoker: No     Systolic Blood Pressure: 136 mmHg     Is BP treated: Yes     HDL Cholesterol: 97 mg/dL     Total Cholesterol: 173 mg/dL       11/27/2021    9:04 AM 09/02/2021   11:49 AM 06/25/2021    9:54 AM  Depression screen PHQ 2/9  Decreased Interest 0 0 0  Down, Depressed, Hopeless 0 0 0  PHQ - 2 Score 0 0 0  Altered sleeping   0  Tired, decreased energy   0  Change in appetite   0  Feeling bad or failure about yourself    0  Trouble concentrating   0  Moving slowly or fidgety/restless   0  Suicidal thoughts   0  PHQ-9 Score   0  Difficult doing work/chores   Not difficult at all     ***Other: (CHADS2VASc if Afib, MMRC or CAT for COPD, ACT, DEXA)  Social History   Tobacco Use  Smoking Status Never  Smokeless Tobacco Never   BP Readings from Last 3 Encounters:  02/04/22 136/68  01/08/22 (!) 140/72  11/27/21 (!) 142/70   Pulse Readings from Last 3 Encounters:  02/04/22 75  01/08/22 69  11/27/21 78   Wt Readings from Last 3 Encounters:  02/04/22 119 lb (54 kg)  01/08/22 119 lb (54 kg)  11/27/21 121 lb (54.9 kg)   BMI Readings from Last 3 Encounters:  02/04/22 21.77 kg/m  01/08/22 21.77 kg/m  11/27/21 22.13 kg/m    Assessment/Interventions: Review of patient past medical history, allergies, medications, health status, including review of consultants reports, laboratory and other test data, was performed as part of comprehensive evaluation and provision of chronic care management services.   SDOH:  (Social Determinants of Health) assessments and interventions performed: {yes/no:20286} SDOH Interventions    Flowsheet Row Clinical Support from 07/14/2019 in [facility] Clinical Support from 07/08/2018 in [facility]  SDOH Interventions    Depression Interventions/Treatment  PHQ2-9 Score <4 Follow-up Not Indicated PHQ2-9  Score <4 Follow-up Not Indicated      SDOH Screenings   Food Insecurity: No Food Insecurity (06/12/2021)  Transportation Needs: No Transportation Needs (06/12/2021)  Depression (PHQ2-9): Low Risk  (11/27/2021)  Financial Resource Strain: Low Risk  (06/12/2021)  Physical Activity: Insufficiently Active (06/12/2021)  Stress: No Stress Concern Present (06/12/2021)  Tobacco Use: Low Risk  (01/07/2022)    CCM Care Plan  Allergies  Allergen Reactions   Codeine Nausea And Vomiting   Percocet [Oxycodone-Acetaminophen] Nausea And Vomiting    Medications Reviewed Today     Reviewed by Martin, Ciera J,   CMA Estate agent) on 02/04/22 at [phone]  Med List Status: <None>   Medication Order Taking? Sig Documenting Provider Last Dose Status Informant  acetaminophen (TYLENOL) 500 MG tablet [id number] Yes Take 1,000 mg by mouth every 6 (six) hours as needed for mild pain or moderate pain. [provider] Taking Active Self  amLODipine (NORVASC) 10 MG tablet [id number] Yes Take 1 tablet (10 mg total) by mouth daily. [name], FNP Taking Active   anastrozole (ARIMIDEX) 1 MG tablet [id number] Yes TAKE 1 TABLET(1 MG) BY MOUTH DAILY Alla Feeling, NP Taking Active   Ascorbic Acid (VITAMIN C) 1000 MG tablet [id number] Yes Take 1,000 mg by mouth daily. [provider] Taking Active Self  aspirin EC 81 MG tablet [id number] Yes Take 81 mg by mouth daily. [provider] Taking Active Self  atorvastatin (LIPITOR) 80 MG tablet [id number] Yes Take 1 tablet (80 mg total) by mouth daily. [name], FNP Taking Active   cholecalciferol (VITAMIN D) 1000 UNITS tablet [id number] Yes Take 1,000 Units by mouth daily.  [provider] Taking Active Self  clopidogrel (PLAVIX) 75 MG tablet [id number] Yes Take 1 tablet (75 mg total) by mouth daily with breakfast. Margie Billet, PA-C Taking Active Self  FLUZONE HIGH-DOSE QUADRIVALENT 0.7 ML SUSY [id number] Yes  [provider] Taking Active Self  glimepiride (AMARYL) 4 MG tablet [id number] Yes TAKE 1 TABLET BY MOUTH EVERY MORNING [name], FNP Taking Active   nitroGLYCERIN (NITROSTAT) 0.4 MG SL tablet [id number] Yes Place 1 tablet (0.4 mg total) under the tongue every 5 (five) minutes as needed for chest pain. Margie Billet, PA-C Taking Active Self           Med Note Orma Render   Fri Aug 30, 2021  9:44 AM) Patient has on hand if needed  olmesartan (BENICAR) 40 MG tablet [id number] Yes TAKE 1 TABLET(40 MG) BY MOUTH DAILY [name], FNP Taking Active   [facility] ULTRA test strip [id number] Yes USE AS DIRECTED TWICE DAILY [name], FNP Taking Active Self  pantoprazole (PROTONIX) 40 MG tablet [id number] Yes Take 40 mg by mouth daily. [provider] Taking Active   Polyethyl Glycol-Propyl Glycol (SYSTANE OP) [id number] Yes Place 1 drop into both eyes 2 (two) times daily. [provider] Taking Active Self  polyethylene glycol (MIRALAX / GLYCOLAX) packet [id number] Yes Take 17 g by mouth daily.  [provider] Taking Active Self            Patient Active Problem List   Diagnosis Date Noted   Coronary artery disease involving native coronary artery with angina pectoris ([place]) 04/08/2021   Abnormal cardiac CT angiography 04/08/2021   PAD (peripheral artery disease) ([place]) 09/11/2020   Malignant neoplasm of upper-inner quadrant of right breast in female, estrogen receptor positive ([place]) 10/11/2018   Localized skin eruption 10/07/2018   Breast mass seen on mammogram 10/07/2018   Gastroesophageal reflux disease without esophagitis 10/07/2018   Chest pain 12/23/2015   Essential hypertension    Diabetes mellitus type 2 in nonobese ([place])    Anemia 06/21/2013   Personal history of colonic polyps 05/04/2013   Type II diabetes mellitus with manifestations, uncontrolled 02/21/2008   HYPERTENSION 02/21/2008   IBS 02/21/2008   NEPHROLITHIASIS, HX OF 02/21/2008     Immunization History  Administered Date(s) Administered   Fluad Quad(high Dose 65+) 12/20/2019, 02/04/2022   Influenza, High Dose Seasonal PF 01/13/2018, 01/05/2019   Influenza-Unspecified 01/15/2021   PFIZER(Purple Top)SARS-COV-2  Vaccination 05/03/2019, 05/21/2019, 01/04/2020, 07/30/2020   Pfizer Covid-19 Vaccine Bivalent Booster 22yr & up 01/01/2021   Pneumococcal Conjugate-13 04/11/2019   Pneumococcal Polysaccharide-23 09/11/2020   Td 03/24/2010   Tdap 06/12/2021   Zoster Recombinat (Shingrix) 08/27/2021, 10/29/2021    Conditions to be addressed/monitored:  {USCCMDZASSESSMENTOPTIONS:23563}  There are no care plans that you recently modified to display for this patient.    Medication Assistance: {MEDASSISTANCEINFO:25044}  Compliance/Adherence/Medication fill history: Care Gaps: ***  Star-Rating Drugs: ***  [facility]  [facility]Phone: [phone]: [phone] [facility] [address]Phone: [phone]: [phone] Uses pill box? {Yes or If no, why not?:20788} Pt endorses ***% compliance  We discussed: {Pharmacy options:24294} Patient decided to: {US Pharmacy Plan:23885}  Care Plan and Follow Up Patient Decision:  {FOLLOWUP:24991}  Plan: {CM FOLLOW UP PLAN:25073}  ***

## 2022-03-11 ENCOUNTER — Other Ambulatory Visit: Payer: Self-pay | Admitting: Nurse Practitioner

## 2022-03-11 ENCOUNTER — Other Ambulatory Visit: Payer: Self-pay | Admitting: Cardiology

## 2022-03-12 ENCOUNTER — Other Ambulatory Visit: Payer: Self-pay

## 2022-03-12 DIAGNOSIS — I10 Essential (primary) hypertension: Secondary | ICD-10-CM

## 2022-03-12 MED ORDER — OLMESARTAN MEDOXOMIL 40 MG PO TABS: ORAL_TABLET | ORAL | 1 refills | Status: DC

## 2022-03-14 ENCOUNTER — Ambulatory Visit: Payer: Medicare Other | Admitting: Dietician

## 2022-04-01 DIAGNOSIS — E1142 Type 2 diabetes mellitus with diabetic polyneuropathy: Secondary | ICD-10-CM | POA: Diagnosis not present

## 2022-04-01 DIAGNOSIS — I10 Essential (primary) hypertension: Secondary | ICD-10-CM | POA: Diagnosis not present

## 2022-04-02 ENCOUNTER — Encounter: Payer: Self-pay | Admitting: Internal Medicine

## 2022-04-02 ENCOUNTER — Ambulatory Visit (INDEPENDENT_AMBULATORY_CARE_PROVIDER_SITE_OTHER): Payer: Medicare Other | Admitting: Internal Medicine

## 2022-04-02 VITALS — BP 130/82 | HR 72 | Temp 98.6°F | Ht 62.0 in | Wt 122.4 lb

## 2022-04-02 DIAGNOSIS — Z170 Estrogen receptor positive status [ER+]: Secondary | ICD-10-CM

## 2022-04-02 DIAGNOSIS — E1151 Type 2 diabetes mellitus with diabetic peripheral angiopathy without gangrene: Secondary | ICD-10-CM

## 2022-04-02 DIAGNOSIS — C50211 Malignant neoplasm of upper-inner quadrant of right female breast: Secondary | ICD-10-CM

## 2022-04-02 DIAGNOSIS — Z6822 Body mass index (BMI) 22.0-22.9, adult: Secondary | ICD-10-CM

## 2022-04-02 DIAGNOSIS — I119 Hypertensive heart disease without heart failure: Secondary | ICD-10-CM | POA: Diagnosis not present

## 2022-04-02 DIAGNOSIS — M85839 Other specified disorders of bone density and structure, unspecified forearm: Secondary | ICD-10-CM | POA: Diagnosis not present

## 2022-04-02 DIAGNOSIS — I700 Atherosclerosis of aorta: Secondary | ICD-10-CM | POA: Diagnosis not present

## 2022-04-02 DIAGNOSIS — I25119 Atherosclerotic heart disease of native coronary artery with unspecified angina pectoris: Secondary | ICD-10-CM | POA: Diagnosis not present

## 2022-04-02 NOTE — Progress Notes (Signed)
Rich Brave [place],acting as a Education administrator for Maximino Greenland, MD.,have documented all relevant documentation on the behalf of Maximino Greenland, MD,as directed by  Maximino Greenland, MD while in the presence of Maximino Greenland, MD.    Subjective:     Patient ID: Elizabeth Mcfarland , female    DOB: 01/10/1941 , 82 y.o.   MRN: [id number]   Chief Complaint  Patient presents with   Diabetes   Hypertension    HPI  Patient presents today for a diabetes and bp check. Patient reports compliance with her meds.  She has been seen by [name], NP, previously seen by Dr. Carlis Abbott. She requested to be seen by MD during [facility] pharmacy visit. She denies having any cp/sob/palpitations. States she is also followed by Cardiology for CAD s/p stent placement. Has been on Farxiga/Jardiance for diabetes; however, states she lost too much weight on it.    Diabetes She presents for her follow-up diabetic visit. She has type 2 diabetes mellitus. Her disease course has been stable. There are no hypoglycemic associated symptoms. Pertinent negatives for hypoglycemia include no dizziness or headaches. Pertinent negatives for diabetes include no chest pain, no polydipsia, no polyphagia and no polyuria. There are no hypoglycemic complications. Symptoms are stable. There are no diabetic complications. Risk factors for coronary artery disease include hypertension, sedentary lifestyle and dyslipidemia. Current diabetic treatment includes oral agent (dual therapy). She is compliant with treatment all of the time. Her weight is stable. She is following a generally healthy diet. When asked about meal planning, she reported none. She participates in exercise daily (gardening). (Blood sugars was 210 last night, she took her glyburide at bedtime and her blood sugar this morning was 92 after taking a 1/2 tablet) An ACE inhibitor/angiotensin II receptor blocker is being taken. Eye exam is current.  Hypertension This is a chronic problem. The  current episode started more than 1 year ago. The problem is unchanged. Pertinent negatives include no chest pain, headaches or palpitations. Past treatments include ACE inhibitors. The current treatment provides significant improvement.     Past Medical History:  Diagnosis Date   Allergy    Anemia    Blood transfusion without reported diagnosis    Breast cancer ([place]) 11/2018   Right breast invasive mammary carcinoma   Cataract    Chronic kidney disease    Diabetes mellitus without complication (HCC)    Esophageal ulcer with bleeding 05/05/2013   05/05/2013 EGD linear distal esophageal ulcer - cause of hematemesis prior to EGD    GERD (gastroesophageal reflux disease)    History of kidney stones    Hyperlipidemia    Hypertension    Kidney cysts    Personal history of radiation therapy    Sarcoidosis 1970     Family History  Problem Relation Age of Onset   Early death Mother    Stroke Father    Diabetes Sister    Kidney failure Sister    Colon polyps Sister    Stroke Brother    Colon polyps Brother    Cancer Brother 21       prostate cancer   Heart disease Brother    Emphysema Brother    Cancer Brother 8       prostate cancer   Asthma Brother    CAD Maternal Grandmother    Breast cancer Neg Hx    Colon cancer Neg Hx    Esophageal cancer Neg Hx    Rectal cancer Neg  Hx    Stomach cancer Neg Hx      Current Outpatient Medications:    acetaminophen (TYLENOL) 500 MG tablet, Take 1,000 mg by mouth every 6 (six) hours as needed for mild pain or moderate pain., Disp: , Rfl:    amLODipine (NORVASC) 10 MG tablet, TAKE 1 TABLET(10 MG) BY MOUTH DAILY, Disp: 90 tablet, Rfl: 1   anastrozole (ARIMIDEX) 1 MG tablet, TAKE 1 TABLET(1 MG) BY MOUTH DAILY, Disp: 30 tablet, Rfl: 3   Ascorbic Acid (VITAMIN C) 1000 MG tablet, Take 1,000 mg by mouth daily., Disp: , Rfl:    aspirin EC 81 MG tablet, Take 81 mg by mouth daily., Disp: , Rfl:    atorvastatin (LIPITOR) 80 MG tablet, Take 1  tablet (80 mg total) by mouth daily., Disp: 90 tablet, Rfl: 3   clopidogrel (PLAVIX) 75 MG tablet, TAKE 1 TABLET BY MOUTH EVERY DAY WITH BREAKFAST, Disp: 90 tablet, Rfl: 2   glimepiride (AMARYL) 4 MG tablet, TAKE 1 TABLET BY MOUTH EVERY MORNING, Disp: 90 tablet, Rfl: 1   hydrochlorothiazide (HYDRODIURIL) 12.5 MG tablet, Take 1 tablet (12.5 mg total) by mouth daily., Disp: 90 tablet, Rfl: 2   nitroGLYCERIN (NITROSTAT) 0.4 MG SL tablet, Place 1 tablet (0.4 mg total) under the tongue every 5 (five) minutes as needed for chest pain., Disp: 25 tablet, Rfl: 1   olmesartan (BENICAR) 40 MG tablet, TAKE 1 TABLET(40 MG) BY MOUTH DAILY, Disp: 90 tablet, Rfl: 1   ONETOUCH ULTRA test strip, USE AS DIRECTED TWICE DAILY, Disp: 100 strip, Rfl: 3   pantoprazole (PROTONIX) 40 MG tablet, Take 40 mg by mouth daily., Disp: , Rfl:    Polyethyl Glycol-Propyl Glycol (SYSTANE OP), Place 1 drop into both eyes 2 (two) times daily., Disp: , Rfl:    polyethylene glycol (MIRALAX / GLYCOLAX) packet, Take 17 g by mouth daily. , Disp: , Rfl:    cholecalciferol (VITAMIN D) 1000 UNITS tablet, Take 1,000 Units by mouth daily.  (Patient not taking: Reported on 04/02/2022), Disp: , Rfl:    Allergies  Allergen Reactions   Codeine Nausea And Vomiting   Percocet [Oxycodone-Acetaminophen] Nausea And Vomiting     Review of Systems  Constitutional: Negative.   Eyes: Negative.   Respiratory: Negative.    Cardiovascular: Negative.  Negative for chest pain and palpitations.  Gastrointestinal: Negative.   Endocrine: Negative for polydipsia, polyphagia and polyuria.  Musculoskeletal: Negative.   Skin: Negative.   Neurological: Negative.  Negative for dizziness and headaches.  Psychiatric/Behavioral: Negative.       Today's Vitals   04/02/22 1053  BP: 130/82  Pulse: 72  Temp: 98.6 F (37 C)  Weight: 122 lb 6.4 oz (55.5 kg)  Height: '5\' 2"'$  (1.575 m)  PainSc: 0-No pain   Body mass index is 22.39 kg/m.  Wt Readings from Last 3  Encounters:  04/02/22 122 lb 6.4 oz (55.5 kg)  02/04/22 119 lb (54 kg)  01/08/22 119 lb (54 kg)     Objective:  Physical Exam Vitals and nursing note reviewed.  Constitutional:      Appearance: Normal appearance.  HENT:     Head: Normocephalic and atraumatic.     Nose:     Comments: Masked     Mouth/Throat:     Comments: Masked  Eyes:     Extraocular Movements: Extraocular movements intact.  Cardiovascular:     Rate and Rhythm: Normal rate and regular rhythm.     Heart sounds: Normal heart sounds.  Pulmonary:  Effort: Pulmonary effort is normal.     Breath sounds: Normal breath sounds.  Musculoskeletal:     Cervical back: Normal range of motion.  Skin:    General: Skin is warm.  Neurological:     General: No focal deficit present.     Mental Status: She is alert.  Psychiatric:        Mood and Affect: Mood normal.        Behavior: Behavior normal.       Assessment And Plan:     1. Type 2 diabetes mellitus with diabetic peripheral angiopathy without gangrene, without long-term current use of insulin (HCC) Comments: Chronic, I will check labs as below. Previous labs reviewed, we discussed possibly resuming SGLT2 Inh therapy, with decreased dosing. F/u 3 months. - Hemoglobin A1c - CMP14+EGFR - Amb Referral To Provider Referral Exercise Program (P.R.E.P)  2. Hypertensive heart disease without heart failure Comments: Chronic, goal BP<130/80. She is encouraged to follow heart healthy lifestyle and low sodium diet. - CMP14+EGFR - Amb Referral To Provider Referral Exercise Program (P.R.E.P)  3. Coronary artery disease involving native coronary artery of native heart with angina pectoris [facility]) Comments: Chronic, recent Cardioloty notes reviewed. Importance of medication/dietary compliance was d/w patient. - CMP14+EGFR - Amb Referral To Provider Referral Exercise Program (P.R.E.P)  4. Malignant neoplasm of upper-inner quadrant of right breast in female, estrogen  receptor positive ([place]) Comments: Diagnosed 09/2018. Treated with lumpectomy, adjuvant XRT and adjuvant therapy w/ anastrozole started Aug 2020. She will c/w anastrozole as per Oncology.  5. Aortic atherosclerosis ([place]) Comments: Seen on CT abd/pelvis. LDL goal < 70.  She will c/w atorvastatin '80mg'$  and ASA '81mg'$  daily.  6. Osteopenia of forearm, unspecified laterality Comments: She is due for bone density in July 2024. - DG Bone Density; Future  7. BMI 22.0-22.9, adult Comments: She agrees to PREP referral.   Patient was given opportunity to ask questions. Patient verbalized understanding of the plan and was able to repeat key elements of the plan. All questions were answered to their satisfaction.    I, Maximino Greenland, MD, have reviewed all documentation for this visit. The documentation on 04/02/22 for the exam, diagnosis, procedures, and orders are all accurate and complete.   IF YOU HAVE BEEN REFERRED TO A SPECIALIST, IT MAY TAKE 1-2 WEEKS TO SCHEDULE/PROCESS THE REFERRAL. IF YOU HAVE NOT HEARD FROM US/SPECIALIST IN TWO WEEKS, PLEASE GIVE [place] A CALL AT [phone] X [phone].   THE PATIENT IS ENCOURAGED TO PRACTICE SOCIAL DISTANCING DUE TO THE COVID-19 PANDEMIC.

## 2022-04-02 NOTE — Patient Instructions (Signed)

## 2022-04-03 LAB — CMP14+EGFR
ALT: 26 IU/L (ref 0–32)
AST: 26 IU/L (ref 0–40)
Albumin/Globulin Ratio: 1.9 (ref 1.2–2.2)
Albumin: 4.8 g/dL (ref 3.8–4.8)
Alkaline Phosphatase: 110 IU/L (ref 44–121)
BUN/Creatinine Ratio: 14 (ref 12–28)
BUN: 10 mg/dL (ref 8–27)
Bilirubin Total: 0.7 mg/dL (ref 0.0–1.2)
CO2: 25 mmol/L (ref 20–29)
Calcium: 10.3 mg/dL (ref 8.7–10.3)
Chloride: 101 mmol/L (ref 96–106)
Creatinine, Ser: 0.74 mg/dL (ref 0.57–1.00)
Globulin, Total: 2.5 g/dL (ref 1.5–4.5)
Glucose: 209 mg/dL — ABNORMAL HIGH (ref 70–99)
Potassium: 4.2 mmol/L (ref 3.5–5.2)
Sodium: 140 mmol/L (ref 134–144)
Total Protein: 7.3 g/dL (ref 6.0–8.5)
eGFR: 82 mL/min/{1.73_m2} (ref >59)

## 2022-04-03 LAB — HEMOGLOBIN A1C
Est. average glucose Bld gHb Est-mCnc: 174 mg/dL
Hgb A1c MFr Bld: 7.7 % — ABNORMAL HIGH (ref 4.8–5.6)

## 2022-04-04 ENCOUNTER — Telehealth: Payer: Self-pay

## 2022-04-04 NOTE — Telephone Encounter (Signed)
Called to discuss PREP program referral; left voicemail. 

## 2022-04-14 DIAGNOSIS — E11319 Type 2 diabetes mellitus with unspecified diabetic retinopathy without macular edema: Secondary | ICD-10-CM | POA: Diagnosis not present

## 2022-04-14 LAB — HM DIABETES EYE EXAM

## 2022-04-21 NOTE — Progress Notes (Unsigned)
Patient Care Team: Glendale Chard, MD as PCP - General (Internal Medicine) [name], [name], MD as PCP - Cardiology (Cardiology) [name], RN as Oncology Nurse Navigator [name], RN as Oncology Nurse Navigator [name], MD as Consulting Physician (Hematology) [name], MD as Consulting Physician (General Surgery) [name], MD as Consulting Physician (Radiation Oncology) Alla Feeling, NP as Nurse Practitioner (Nurse Practitioner) Mayford Knife, [place] (Pharmacist) [place], Heather, OD (Optometry)   CHIEF COMPLAINT: Follow-up right breast cancer  Oncology History Overview Note  Cancer Staging Malignant neoplasm of upper-inner quadrant of right breast in female, estrogen receptor positive ([place]) Staging form: Breast, AJCC 8th Edition - Clinical stage from 10/07/2018: cT2, cN0, cM0, GX, ER+, PR+, HER2: Equivocal - Signed by [name], MD on 10/12/2018 - Pathologic stage from 12/25/2018: Stage IA (pT2, pN0, cM0, G2, ER+, PR+, HER2-, Oncotype DX score: 21) - Signed by [name], MD on 02/01/2019    Malignant neoplasm of upper-inner quadrant of right breast in female, estrogen receptor positive ([place])  10/01/2018 Imaging   Bone Density Scan 10/01/18  IMPRESSION Lowest T-score of -0.9 at left femur neck (NORMAL) T-score DualFemur Neck Left  10/01/2018    76.1         -0.9    0.916 g/cm2   AP Spine  L1-L4      10/01/2018    76.1         1.2     1.326 g/cm2   DualFemur Total Mean 10/01/2018    76.1         0.1     1.024 g/cm2   10/05/2018 Mammogram   Diagnostic Mammogram 10/05/18 IMPRESSION: 1. Highly suspicious right breast mass 1 o'clock position 2 cm from the nipple on the right. It measures 2.1 x 1.7 x 1.5 cm. Corresponding with the screening mammographic findings. Recommendation is for ultrasound-guided biopsy. 2. Indeterminate left breast mass at the 10 o'clock position 9 cm from the nipple. It measures 1.3 x 0.5 x 0.3 cm. This may represent a  benign etiology such as a degenerating fibroadenoma. However, ultrasound-guided biopsy for definitive tissue diagnosis is recommended. 3. No suspicious lymphadenopathy bilaterally.   10/07/2018 Cancer Staging   Staging form: Breast, AJCC 8th Edition - Clinical stage from 10/07/2018: cT2, cN0, cM0, GX, ER+, PR+, HER2: Equivocal - Signed by [name], MD on 10/12/2018   10/07/2018 Initial Biopsy   Diagnosis 10/07/18 1. Breast, right, needle core biopsy, 1 o'clock, 2cmfn - INVASIVE MAMMARY CARCINOMA WITH CALCIFICATIONS. SEE NOTE 2. Breast, left, needle core biopsy, 10 o'clock - FIBROADENOMA WITH DYSTROPHIC CALCIFICATIONS - NEGATIVE FOR CARCINOMA   10/07/2018 Receptors her2   By immunohistochemistry, the tumor cells are EQUIVOCAL for Her2 (2+). HER2 by [place] will be PERFORMED and the RESULTS REPORTED SEPARATELY Estrogen Receptor: 100%, POSITIVE, STRONG STAINING INTENSITY Progesterone Receptor: 70%, POSITIVE, STRONG STAINING INTENSITY Proliferation Marker Ki67: 10%   10/11/2018 Initial Diagnosis   Malignant neoplasm of upper-inner quadrant of right breast in female, estrogen receptor positive ([place])   10/2018 -  Neo-Adjuvant Anti-estrogen oral therapy   She started anastrozole end of 10/2018 before surgery.   11/25/2018 Surgery   BILATERAL BREAST LUMPECTOMIES  WITH BILATERAL  RADIOACTIVE SEEDS AND RIGHT SENTINEL LYMPH NODE BIOPSY by Dr [name]  11/24/28   11/25/2018 Pathology Results   Diagnosis 11/25/18 1. Breast, lumpectomy, Left w/seed - FIBROADENOMA WITH CALCIFICATIONS. - BIOPSY SITE CHANGES. 2. Lymph node, sentinel, biopsy, Right Axillary #1 - ONE LYMPH NODE, NEGATIVE FOR  CARCINOMA (0/1). 3. Lymph node, sentinel, biopsy, Right Axillary #2 - ONE LYMPH NODE, NEGATIVE FOR CARCINOMA (0/1). 4. Lymph node, sentinel, biopsy, Right - ONE LYMPH NODE, NEGATIVE FOR CARCINOMA (0/1). 5. Breast, lumpectomy, Right w/seed - INVASIVE DUCTAL CARCINOMA, 2.6 CM, [place]TINGHAM GRADE 2 OF 3. - MARGINS OF RESECTION  ARE NOT INVOLVED (CLOSEST MARGIN: LESS THAN 1 MM, SUPERIOR). - BIOPSY SITE CHANGES. - SEE ONCOLOGY TABLE. 6. Medical device, removal, radioactive seed - RADIOACTIVE SEED. - GROSS ONLY.   11/25/2018 Oncotype testing   Oncotype 11/25/18 Recurrence Score 21 with distant recurrence risk at 9 years of with Tamoxifen alone 7%.  There is less than 1% benefit from chemo.    12/25/2018 Cancer Staging   Staging form: Breast, AJCC 8th Edition - Pathologic stage from 12/25/2018: Stage IA (pT2, pN0, cM0, G2, ER+, PR+, HER2-, Oncotype DX score: 21) - Signed by [name], MD on 02/01/2019   01/11/2019 - 02/08/2019 Radiation Therapy   Adjuvant Radiation with Dr [name] 01/11/19-02/08/19   05/05/2019 Survivorship   SCP delivered by Cira Rue, NP       CURRENT THERAPY:  Anastrozole 1 mg daily, starting 10/2018   INTERVAL HISTORY Ms Elizabeth Mcfarland returns for follow-up as scheduled, last seen by me 10/21/2021. In past 6 months she has noticed occasional small amount of right nipple discharge. Denies new lump/mass, inversion, or skin change. 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 Breast imaging in 12/2021 was stable, nothing suspicious. She continues anastrozole, tolerating well without side effects such as joint pain or hot flashes. She nearly passed out doing yoga one day but otherwise does not get dizzy.   ROS  All other systems reviewed and negative  Past Medical History:  Diagnosis Date   Allergy    Anemia    Blood transfusion without reported diagnosis    Breast cancer ([place]) 11/2018   Right breast invasive mammary carcinoma   Cataract    Chronic kidney disease    Diabetes mellitus without complication ([place])    Esophageal ulcer with bleeding 05/05/2013   05/05/2013 EGD linear distal esophageal ulcer - cause of hematemesis prior to EGD    GERD (gastroesophageal reflux disease)    History of kidney stones    Hyperlipidemia    Hypertension    Kidney cysts     Personal history of radiation therapy    Sarcoidosis 1970     Past Surgical History:  Procedure Laterality Date   ABDOMINAL HYSTERECTOMY     BREAST BIOPSY Left 07/09/2021   BREAST EXCISIONAL BIOPSY Left 11/2018   Fibroadenoma   BREAST LUMPECTOMY     BREAST LUMPECTOMY WITH RADIOACTIVE SEED AND SENTINEL LYMPH NODE BIOPSY Bilateral 11/25/2018   Procedure: BILATERAL BREAST LUMPECTOMIES  WITH BILATERAL  RADIOACTIVE SEEDS AND RIGHT SENTINEL LYMPH NODE BIOPSY;  Surgeon: [name], MD;  Location: [facility];  Service: General;  Laterality: Bilateral;   CATARACT EXTRACTION W/ INTRAOCULAR LENS IMPLANT     COLONOSCOPY     COLONOSCOPY, ESOPHAGOGASTRODUODENOSCOPY (EGD) AND ESOPHAGEAL DILATION N/A 05/05/2013   Procedure: COLONOSCOPY, ESOPHAGOGASTRODUODENOSCOPY (EGD) AND ESOPHAGEAL DILATION (ED);  Surgeon: Gatha Mayer, MD;  Location: [facility];  Service: Endoscopy;  Laterality: N/A;   LEFT HEART CATH AND CORONARY ANGIOGRAPHY N/A 04/08/2021   Procedure: LEFT HEART CATH AND CORONARY ANGIOGRAPHY;  Surgeon: Sherren Mocha, MD;  Location: [facility];  Service: Cardiovascular;  Laterality: N/A;   LITHOTRIPSY     POLYPECTOMY     UPPER  GASTROINTESTINAL ENDOSCOPY       Outpatient Encounter Medications as of 04/23/2022  Medication Sig Note   acetaminophen (TYLENOL) 500 MG tablet Take 1,000 mg by mouth every 6 (six) hours as needed for mild pain or moderate pain.    amLODipine (NORVASC) 10 MG tablet TAKE 1 TABLET(10 MG) BY MOUTH DAILY    Ascorbic Acid (VITAMIN C) 1000 MG tablet Take 1,000 mg by mouth daily.    aspirin EC 81 MG tablet Take 81 mg by mouth daily.    atorvastatin (LIPITOR) 80 MG tablet Take 1 tablet (80 mg total) by mouth daily.    cholecalciferol (VITAMIN D) 1000 UNITS tablet Take 1,000 Units by mouth daily.    clopidogrel (PLAVIX) 75 MG tablet TAKE 1 TABLET BY MOUTH EVERY DAY WITH BREAKFAST    glimepiride (AMARYL) 4 MG tablet TAKE 1 TABLET BY MOUTH EVERY MORNING    olmesartan  (BENICAR) 40 MG tablet TAKE 1 TABLET(40 MG) BY MOUTH DAILY    ONETOUCH ULTRA test strip USE AS DIRECTED TWICE DAILY    pantoprazole (PROTONIX) 40 MG tablet Take 40 mg by mouth daily.    Polyethyl Glycol-Propyl Glycol (SYSTANE OP) Place 1 drop into both eyes 2 (two) times daily.    polyethylene glycol (MIRALAX / GLYCOLAX) packet Take 17 g by mouth daily.     [DISCONTINUED] anastrozole (ARIMIDEX) 1 MG tablet TAKE 1 TABLET(1 MG) BY MOUTH DAILY    anastrozole (ARIMIDEX) 1 MG tablet Take 1 tablet (1 mg total) by mouth daily.    hydrochlorothiazide (HYDRODIURIL) 12.5 MG tablet Take 1 tablet (12.5 mg total) by mouth daily. (Patient not taking: Reported on 04/23/2022)    nitroGLYCERIN (NITROSTAT) 0.4 MG SL tablet Place 1 tablet (0.4 mg total) under the tongue every 5 (five) minutes as needed for chest pain. 08/30/2021: Patient has on hand if needed   No facility-administered encounter medications on file as of 04/23/2022.     Today's Vitals   04/23/22 0923 04/23/22 0934  BP: 120/70   Pulse: 67   Resp: 14   Temp: 98.4 F (36.9 C)   TempSrc: Oral   SpO2: 100%   Weight: 123 lb 6.4 oz (56 kg)   PainSc:  0-No pain   Body mass index is 22.57 kg/m.   PHYSICAL EXAM GENERAL:alert, no distress and comfortable SKIN: no rash  EYES: sclera clear NECK: without mass LYMPH:  no palpable cervical or supraclavicular lymphadenopathy  LUNGS:  normal breathing effort HEART:  no lower extremity edema ABDOMEN: abdomen soft, non-tender and normal bowel sounds NEURO: alert & oriented x 3 with fluent speech, no focal motor/sensory deficits Breast exam: dried discharge at right nipple. S/p bilateral lumpectomies, incisions completely healed with scar tissue and moderate R Br lymphedema. Left breast with anterior chest wall keloid and scattered densities concentrating in the upper outer breast, no discrete mass. No axillary adenopathy. Exam similar to prior    CBC    Component Value Date/Time   WBC 5.3  04/23/2022 0843   WBC 6.3 04/09/2021 0342   RBC 4.23 04/23/2022 0843   HGB 13.1 04/23/2022 0843   HGB 13.2 08/27/2021 1049   HCT 38.8 04/23/2022 0843   HCT 39.8 08/27/2021 1049   PLT 246 04/23/2022 0843   PLT 225 08/27/2021 1049   MCV 91.7 04/23/2022 0843   MCV 90 08/27/2021 1049   MCH 31.0 04/23/2022 0843   MCHC 33.8 04/23/2022 0843   RDW 12.3 04/23/2022 0843   RDW 12.1 08/27/2021 1049   LYMPHSABS  0.9 04/23/2022 0843   MONOABS 0.4 04/23/2022 0843   EOSABS 0.1 04/23/2022 0843   BASOSABS 0.0 04/23/2022 0843     CMP     Component Value Date/Time   NA 141 04/23/2022 0843   NA 140 04/02/2022 1138   K 3.7 04/23/2022 0843   CL 104 04/23/2022 0843   CO2 33 (H) 04/23/2022 0843   GLUCOSE 84 04/23/2022 0843   BUN 15 04/23/2022 0843   BUN 10 04/02/2022 1138   CREATININE 0.79 04/23/2022 0843   CALCIUM 10.0 04/23/2022 0843   PROT 7.5 04/23/2022 0843   PROT 7.3 04/02/2022 1138   ALBUMIN 4.4 04/23/2022 0843   ALBUMIN 4.8 04/02/2022 1138   AST 27 04/23/2022 0843   ALT 23 04/23/2022 0843   ALKPHOS 84 04/23/2022 0843   BILITOT 1.1 04/23/2022 0843   GFRNONAA >60 04/23/2022 0843   GFRAA 64 12/20/2019 1605   GFRAA >60 08/01/2019 0845     ASSESSMENT & PLAN:Kenlee Najla Aughenbaugh is a 82 y.o. female with     1. 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 S/p neoadjuvant anastrozole, b/l lumpectomy, and adjuvant radiation.  Oncotype was low risk, adjuvant chemo was not recommended.  She continues antiestrogen therapy with anastrozole starting 10/2018 -mammogram/L [place] 07/09/21 and biopsy showed 2 areas of benign breast tissue with stromal fibrosis, no malignancy.  This was concordant by pathology  -b/l mammo 01/07/22 was stable, no suspicious findings -Mr. Gibas is clinically doing well. Tolerating anastrozole. Breast exam is stable. Labs unremarkable. Overall no clinical concern for recurrence.  -Continue  surveillance and anastrozole, refilled -F/up in 6 months, or sooner if needed   2. 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 Continue to f/u with PCP  -She had peripheral neuropathy in her feet that has since resolved -she had severe CAD with stenosis and underwent stenting in 03/2021, no residual occlusion. Cardiac meds have been adjusted and she has mild fatigue from that.  -She fell recently and broke her left arm, cast removed yesterday -continue PCP and cardiology f/up   3. Bone health -08/2018 DEXA was normal with lowest T-score 0.9 at left femur neck. -On calcium and Vitamin D.  -Repeat DEXA 10/10/2020 showed mild osteopenia, lowest T score -1.8. continue calcium, vit D, and weight bearing exercise -repeat already scheduled 10/13/22   PLAN: -Last mammo and today's labs reviewed -Continue breast cancer surveillance and anastrozole, refilled -F/up in 6 months, or sooner If needed -DEXA 722/24     All questions were answered. The patient knows to call the clinic with any problems, questions or concerns. No barriers to learning were detected.   Cira Rue, NP-C 04/23/2022

## 2022-04-23 ENCOUNTER — Encounter: Payer: Self-pay | Admitting: Nurse Practitioner

## 2022-04-23 ENCOUNTER — Inpatient Hospital Stay: Payer: Medicare Other

## 2022-04-23 ENCOUNTER — Inpatient Hospital Stay: Payer: Medicare Other | Attending: Nurse Practitioner | Admitting: Nurse Practitioner

## 2022-04-23 ENCOUNTER — Other Ambulatory Visit: Payer: Self-pay

## 2022-04-23 VITALS — BP 120/70 | HR 67 | Temp 98.4°F | Resp 14 | Wt 123.4 lb

## 2022-04-23 DIAGNOSIS — I10 Essential (primary) hypertension: Secondary | ICD-10-CM | POA: Diagnosis not present

## 2022-04-23 DIAGNOSIS — R5383 Other fatigue: Secondary | ICD-10-CM | POA: Insufficient documentation

## 2022-04-23 DIAGNOSIS — Z170 Estrogen receptor positive status [ER+]: Secondary | ICD-10-CM | POA: Insufficient documentation

## 2022-04-23 DIAGNOSIS — I2510 Atherosclerotic heart disease of native coronary artery without angina pectoris: Secondary | ICD-10-CM | POA: Insufficient documentation

## 2022-04-23 DIAGNOSIS — E119 Type 2 diabetes mellitus without complications: Secondary | ICD-10-CM | POA: Diagnosis not present

## 2022-04-23 DIAGNOSIS — Z7984 Long term (current) use of oral hypoglycemic drugs: Secondary | ICD-10-CM | POA: Insufficient documentation

## 2022-04-23 DIAGNOSIS — Z79811 Long term (current) use of aromatase inhibitors: Secondary | ICD-10-CM | POA: Diagnosis not present

## 2022-04-23 DIAGNOSIS — M8580 Other specified disorders of bone density and structure, unspecified site: Secondary | ICD-10-CM | POA: Diagnosis not present

## 2022-04-23 DIAGNOSIS — Z7982 Long term (current) use of aspirin: Secondary | ICD-10-CM | POA: Diagnosis not present

## 2022-04-23 DIAGNOSIS — Z79899 Other long term (current) drug therapy: Secondary | ICD-10-CM | POA: Insufficient documentation

## 2022-04-23 DIAGNOSIS — C50211 Malignant neoplasm of upper-inner quadrant of right female breast: Secondary | ICD-10-CM | POA: Insufficient documentation

## 2022-04-23 DIAGNOSIS — E1142 Type 2 diabetes mellitus with diabetic polyneuropathy: Secondary | ICD-10-CM | POA: Diagnosis not present

## 2022-04-23 LAB — CBC WITH DIFFERENTIAL (CANCER CENTER ONLY)
Abs Immature Granulocytes: 0.02 10*3/uL (ref 0.00–0.07)
Basophils Absolute: 0 10*3/uL (ref 0.0–0.1)
Basophils Relative: 1 %
Eosinophils Absolute: 0.1 10*3/uL (ref 0.0–0.5)
Eosinophils Relative: 1 %
HCT: 38.8 % (ref 36.0–46.0)
Hemoglobin: 13.1 g/dL (ref 12.0–15.0)
Immature Granulocytes: 0 %
Lymphocytes Relative: 18 %
Lymphs Abs: 0.9 10*3/uL (ref 0.7–4.0)
MCH: 31 pg (ref 26.0–34.0)
MCHC: 33.8 g/dL (ref 30.0–36.0)
MCV: 91.7 fL (ref 80.0–100.0)
Monocytes Absolute: 0.4 10*3/uL (ref 0.1–1.0)
Monocytes Relative: 7 %
Neutro Abs: 3.8 10*3/uL (ref 1.7–7.7)
Neutrophils Relative %: 73 %
Platelet Count: 246 10*3/uL (ref 150–400)
RBC: 4.23 MIL/uL (ref 3.87–5.11)
RDW: 12.3 % (ref 11.5–15.5)
WBC Count: 5.3 10*3/uL (ref 4.0–10.5)
nRBC: 0 % (ref 0.0–0.2)

## 2022-04-23 LAB — CMP (CANCER CENTER ONLY)
ALT: 23 U/L (ref 0–44)
AST: 27 U/L (ref 15–41)
Albumin: 4.4 g/dL (ref 3.5–5.0)
Alkaline Phosphatase: 84 U/L (ref 38–126)
Anion gap: 4 — ABNORMAL LOW (ref 5–15)
BUN: 15 mg/dL (ref 8–23)
CO2: 33 mmol/L — ABNORMAL HIGH (ref 22–32)
Calcium: 10 mg/dL (ref 8.9–10.3)
Chloride: 104 mmol/L (ref 98–111)
Creatinine: 0.79 mg/dL (ref 0.44–1.00)
GFR, Estimated: >60 mL/min (ref >60)
Glucose, Bld: 84 mg/dL (ref 70–99)
Potassium: 3.7 mmol/L (ref 3.5–5.1)
Sodium: 141 mmol/L (ref 135–145)
Total Bilirubin: 1.1 mg/dL (ref 0.3–1.2)
Total Protein: 7.5 g/dL (ref 6.5–8.1)

## 2022-04-23 MED ORDER — ANASTROZOLE 1 MG PO TABS: 1.0000 mg | ORAL_TABLET | Freq: Every day | ORAL | 6 refills | Status: DC

## 2022-04-24 ENCOUNTER — Telehealth: Payer: Self-pay | Admitting: Hematology

## 2022-04-24 NOTE — Telephone Encounter (Signed)
Left patient a vm regarding upcoming appointments

## 2022-04-30 ENCOUNTER — Encounter: Payer: Self-pay | Admitting: Cardiovascular Disease

## 2022-04-30 ENCOUNTER — Ambulatory Visit: Payer: Medicare Other | Attending: Cardiovascular Disease | Admitting: Cardiovascular Disease

## 2022-04-30 VITALS — BP 128/58 | HR 58 | Ht 62.0 in | Wt 123.4 lb

## 2022-04-30 DIAGNOSIS — I2510 Atherosclerotic heart disease of native coronary artery without angina pectoris: Secondary | ICD-10-CM | POA: Diagnosis not present

## 2022-04-30 DIAGNOSIS — I4589 Other specified conduction disorders: Secondary | ICD-10-CM | POA: Diagnosis not present

## 2022-04-30 DIAGNOSIS — I10 Essential (primary) hypertension: Secondary | ICD-10-CM | POA: Diagnosis not present

## 2022-04-30 DIAGNOSIS — E1142 Type 2 diabetes mellitus with diabetic polyneuropathy: Secondary | ICD-10-CM | POA: Diagnosis not present

## 2022-04-30 DIAGNOSIS — Z923 Personal history of irradiation: Secondary | ICD-10-CM

## 2022-04-30 DIAGNOSIS — E7800 Pure hypercholesterolemia, unspecified: Secondary | ICD-10-CM

## 2022-04-30 DIAGNOSIS — Z853 Personal history of malignant neoplasm of breast: Secondary | ICD-10-CM | POA: Diagnosis not present

## 2022-04-30 DIAGNOSIS — E119 Type 2 diabetes mellitus without complications: Secondary | ICD-10-CM | POA: Diagnosis not present

## 2022-04-30 NOTE — Patient Instructions (Signed)
Medication Instructions:  Your physician has recommended you make the following change in your medication:  STOP: Plavix and Hydrochlorothiazide  *If you need a refill on your cardiac medications before your next appointment, please call your pharmacy*   Lab Work: NONE If you have labs (blood work) drawn today and your tests are completely normal, you will receive your results only by: [place] (if you have MyChart) OR A paper copy in the mail If you have any lab test that is abnormal or we need to change your treatment, we will call you to review the results.   Testing/Procedures: NONE   Follow-Up: At [facility], you and your health needs are our priority.  As part of our continuing mission to provide you with exceptional heart care, we have created designated Provider Care Teams.  These Care Teams include your primary Cardiologist (physician) and Advanced Practice Providers (APPs -  Physician Assistants and Nurse Practitioners) who all work together to provide you with the care you need, when you need it.  We recommend signing up for the patient portal called "MyChart".  Sign up information is provided on this After Visit Summary.  MyChart is used to connect with patients for Virtual Visits (Telemedicine).  Patients are able to view lab/test results, encounter notes, upcoming appointments, etc.  Non-urgent messages can be sent to your provider as well.   To learn more about what you can do with MyChart, go to [email or web address].    Your next appointment:   1 year(s)  Provider:   [name], MD

## 2022-04-30 NOTE — Progress Notes (Signed)
Cardiology Office Note:    Date:  04/30/2022   ID:  Elizabeth Mcfarland, DOB [birth year], MRN [id number]  PCP:  Glendale Chard, MD   [facility] Providers Cardiologist:  [name], MD     Referring MD: [name], FNP   No chief complaint on file.   History of Present Illness:    Elizabeth Mcfarland is a 82 y.o. female with a hx of CAD s/p PCI-DES of mid LAD in January 2023, well-controlled hypertension and type 2 diabetes mellitus, recent right breast lumpectomy and radiation therapy in 2020 (no chemotherapy), history of nephrolithiasis, remote history of distal esophageal ulcer causing hematemesis in 2015,.  She has done very well in the year that has passed since her LAD stent.  She initially presented with shortness of breath and this problem has not recurred.  She never really had angina.  She remains relatively bradycardic with a heart rate of 58 bpm without beta-blockers.  During her stint in rehab she rarely exceeded 70% of the maximum predicted heart rate for age.    Her blood pressure remains well-controlled in the office today, although she states that it is intermittently high when she checks it at home.  She was prescribed hydrochlorothiazide, but only took this once and is not actively taking it.  When checked in the doctor's office it has been consistently normal.  The patient specifically denies any chest pain at rest exertion, dyspnea at rest or with exertion, orthopnea, paroxysmal nocturnal dyspnea, syncope, palpitations, focal neurological deficits, intermittent claudication, lower extremity edema, unexplained weight gain, cough, hemoptysis or wheezing.   She is still upset that she has been unable to gain weight, but actually her BMI is a little higher at about 22.5, compared to 21 a year ago.  Metabolic control is fair.  Her hemoglobin A1c was recently 7.7%, but her LDL was most recently 68 and the HDL was 97.  She has normal renal function.   Her coronary CT angiogram  showed a markedly elevated calcium score (93rd percentile for age and gender).  In addition there was single-vessel CAD with a high-grade stenosis in the proximal-mid LAD artery with a confirmatory reduced FFR of 0.71.  In 2017 she underwent an echocardiogram and a pharmacological nuclear myocardial perfusion test.  The perfusion images were normal.  LVEF was normal and there were no major valvular abnormalities.  Doppler measurements suggested "grade 1 diastolic dysfunction".  On a CT of the abdomen and pelvis about a year ago there was evidence of advanced calcification in the abdominal aorta and in the coronary arteries.  Labs performed in September showed a normal hemoglobin of 12.3, negative screening for autoimmune disorders, normal ESR at 2, normal renal and liver tests.  Her most recent lipid profile showed a total cholesterol 199, excellent HDL at 107, LDL 86 and triglycerides 29.  She has never smoked.  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  She is originally from [place], [place]  Past Medical History:  Diagnosis Date   Allergy    Anemia    Blood transfusion without reported diagnosis    Breast cancer ([place]) 11/2018   Right breast invasive mammary carcinoma   Cataract    Chronic kidney disease    Diabetes mellitus without complication ([place])    Esophageal ulcer with bleeding 05/05/2013   05/05/2013 EGD linear distal esophageal ulcer - cause of hematemesis prior to EGD    GERD (gastroesophageal reflux disease)  History of kidney stones    Hyperlipidemia    Hypertension    Kidney cysts    Personal history of radiation therapy    Sarcoidosis 1970    Past Surgical History:  Procedure Laterality Date   ABDOMINAL HYSTERECTOMY     BREAST BIOPSY Left 07/09/2021   BREAST EXCISIONAL BIOPSY Left 11/2018   Fibroadenoma   BREAST LUMPECTOMY     BREAST LUMPECTOMY WITH RADIOACTIVE SEED AND SENTINEL LYMPH NODE BIOPSY Bilateral 11/25/2018    Procedure: BILATERAL BREAST LUMPECTOMIES  WITH BILATERAL  RADIOACTIVE SEEDS AND RIGHT SENTINEL LYMPH NODE BIOPSY;  Surgeon: [name], MD;  Location: [facility];  Service: General;  Laterality: Bilateral;   CATARACT EXTRACTION W/ INTRAOCULAR LENS IMPLANT     COLONOSCOPY     COLONOSCOPY, ESOPHAGOGASTRODUODENOSCOPY (EGD) AND ESOPHAGEAL DILATION N/A 05/05/2013   Procedure: COLONOSCOPY, ESOPHAGOGASTRODUODENOSCOPY (EGD) AND ESOPHAGEAL DILATION (ED);  Surgeon: Gatha Mayer, MD;  Location: [facility];  Service: Endoscopy;  Laterality: N/A;   LEFT HEART CATH AND CORONARY ANGIOGRAPHY N/A 04/08/2021   Procedure: LEFT HEART CATH AND CORONARY ANGIOGRAPHY;  Surgeon: Sherren Mocha, MD;  Location: [facility];  Service: Cardiovascular;  Laterality: N/A;   LITHOTRIPSY     POLYPECTOMY     UPPER GASTROINTESTINAL ENDOSCOPY      Current Medications: Current Meds  Medication Sig   acetaminophen (TYLENOL) 500 MG tablet Take 1,000 mg by mouth every 6 (six) hours as needed for mild pain or moderate pain.   amLODipine (NORVASC) 10 MG tablet TAKE 1 TABLET(10 MG) BY MOUTH DAILY   anastrozole (ARIMIDEX) 1 MG tablet Take 1 tablet (1 mg total) by mouth daily.   Ascorbic Acid (VITAMIN C) 1000 MG tablet Take 1,000 mg by mouth daily.   aspirin EC 81 MG tablet Take 81 mg by mouth daily.   atorvastatin (LIPITOR) 80 MG tablet Take 1 tablet (80 mg total) by mouth daily.   cholecalciferol (VITAMIN D) 1000 UNITS tablet Take 1,000 Units by mouth daily.   glimepiride (AMARYL) 4 MG tablet TAKE 1 TABLET BY MOUTH EVERY MORNING   olmesartan (BENICAR) 40 MG tablet TAKE 1 TABLET(40 MG) BY MOUTH DAILY   ONETOUCH ULTRA test strip USE AS DIRECTED TWICE DAILY   pantoprazole (PROTONIX) 40 MG tablet Take 40 mg by mouth daily.   Polyethyl Glycol-Propyl Glycol (SYSTANE OP) Place 1 drop into both eyes 2 (two) times daily.   polyethylene glycol (MIRALAX / GLYCOLAX) packet Take 17 g by mouth daily.    sitaGLIPtin (JANUVIA) 100 MG  tablet Take 100 mg by mouth daily.   [DISCONTINUED] clopidogrel (PLAVIX) 75 MG tablet TAKE 1 TABLET BY MOUTH EVERY DAY WITH BREAKFAST   [DISCONTINUED] hydrochlorothiazide (HYDRODIURIL) 12.5 MG tablet Take 1 tablet (12.5 mg total) by mouth daily.     Allergies:   Codeine and Percocet [oxycodone-acetaminophen]   Social History   Socioeconomic History   Marital status: Single    Spouse name: Not on file   Number of children: 1   Years of education: Not on file   Highest education level: GED or equivalent  Occupational History   Occupation: Physiological scientist: SELF-EMPLOYED   Occupation: retired  Tobacco Use   Smoking status: Never   Smokeless tobacco: Never  Scientific laboratory technician Use: Never used  Substance and Sexual Activity   Alcohol use: No   Drug use: No   Sexual activity: Not Currently  Other Topics Concern   Not on file  Social History  Narrative   Lives alone   Social Determinants of Health   Financial Resource Strain: Low Risk  (06/12/2021)   Overall Financial Resource Strain (CARDIA)    Difficulty of Paying Living Expenses: Not hard at all  Food Insecurity: No Food Insecurity (06/12/2021)   Hunger Vital Sign    Worried About Running Out of Food in the Last Year: Never true    Ran Out of Food in the Last Year: Never true  Transportation Needs: No Transportation Needs (06/12/2021)   PRAPARE - Hydrologist (Medical): No    Lack of Transportation (Non-Medical): No  Physical Activity: Insufficiently Active (06/12/2021)   Exercise Vital Sign    Days of Exercise per Week: 1 day    Minutes of Exercise per Session: 60 min  Stress: No Stress Concern Present (06/12/2021)   [place]    Feeling of Stress : Not at all  Social Connections: Not on file     Family History: The patient's family history includes Asthma in her brother; CAD in her maternal grandmother; Cancer (age of  onset: 79) in her brother and brother; Colon polyps in her brother and sister; Diabetes in her sister; Early death in her mother; Emphysema in her brother; Heart disease in her brother; Kidney failure in her sister; Stroke in her brother and father. There is no history of Breast cancer, Colon cancer, Esophageal cancer, Rectal cancer, or Stomach cancer.  ROS:   Please see the history of present illness.     All other systems reviewed and are negative.  EKGs/Labs/Other Studies Reviewed:    The following studies were reviewed today:  Cardiac catheterization 04/08/2021    Mid LAD lesion is 75% stenosed.   A drug-eluting stent was successfully placed using a STENT ONYX FRONTIER 3.0X15.   Post intervention, there is a 0% residual stenosis.   The left ventricular systolic function is normal.   LV end diastolic pressure is normal.   1.  Severe single-vessel coronary artery disease with successful stenting of a 75% eccentric stenosis in the mid LAD treated with a 3.0 x 15 mm resolute Onyx DES, reducing the stenosis to 0%.  TIMI-3 flow at baseline and post PCI. 2.  Patent left main, left circumflex, and RCA without significant stenoses 3.  Low/normal LVEDP   Coronary CT angiography 03/26/2021 Coronary Calcium Score:   Left main: 209   Left anterior descending artery: 265   Left circumflex artery: 146   Right coronary artery: 73   Ramus Intermedius: 61   Total: 754   Percentile: 93rd for age, sex, and race matched control.   RCA is a large dominant artery that gives rise to PDA and PLA. Mild calcified plaques in the proximal and mid vessel.   Left main is a large artery that gives rise to LAD, RI, and LCX arteries. Mild ostial mixed plaque with mild distal calcified plaque.   LAD is a large vessel that gives rise to one large D1 Branch. Severe mixed plaque in the proximal-mid LAD. Mild calcified plaque in the LAD at the D1 takeoff. Mild calcified plaque in the mid D1  branch. Small myocardial bridge distal LAD.   LCX is a non-dominant artery. Mild calcified plaques in the proximal and mid vessel.   There is a ramus intermedius vessel. Mild calcified plaques in the mid vessel.   Other findings:   Normal pulmonary vein drainage into the left atrium.  Normal left atrial appendage without a thrombus.   Small left atrial diverticulum.   Small interatrial vestigial remnant- normal variant.   Extra-cardiac findings: See attached radiology report for non-cardiac structures.   Cardiac Motion- Slab artifact noted.   IMPRESSION: 1. Coronary calcium score of 754. This was 93rd percentile for age, sex, and race matched control.   2. Normal coronary origin with right dominance.   01/04/2016 nuclear stress test Nuclear stress EF: 67%. There was no ST segment deviation noted during stress. The study is normal. This is a low risk study.   Low risk stress nuclear study with normal perfusion and normal left ventricular regional and global systolic function.  01/04/2016 echocardiogram - Left ventricle: The cavity size was normal. Wall thickness was    normal. Systolic function was vigorous. The estimated ejection    fraction was in the range of 65% to 70%. Wall motion was normal;    there were no regional wall motion abnormalities. Doppler    parameters are consistent with abnormal left ventricular    relaxation (grade 1 diastolic dysfunction).     EKG:  EKG is ordered today and personally reviewed.  It shows normal sinus rhythm/mild sinus bradycardia and minor nonspecific T wave changes.  QT C3 159 ms.  Recent Labs: 08/30/2021: TSH 1.040 04/23/2022: ALT 23; BUN 15; Creatinine 0.79; Hemoglobin 13.1; Platelet Count 246; Potassium 3.7; Sodium 141  Recent Lipid Panel    Component Value Date/Time   CHOL 173 08/27/2021 1049   TRIG 34 08/27/2021 1049   HDL 97 08/27/2021 1049   CHOLHDL 1.8 08/27/2021 1049   CHOLHDL 2.8 12/24/2015 0306   VLDL 12  12/24/2015 0306   LDLCALC 68 08/27/2021 1049     Risk Assessment/Calculations:           Physical Exam:    VS:  BP (!) 128/58   Pulse (!) 58   Ht '5\' 2"'$  (1.575 m)   Wt 123 lb 6.4 oz (56 kg)   SpO2 95%   BMI 22.57 kg/m     Wt Readings from Last 3 Encounters:  04/30/22 123 lb 6.4 oz (56 kg)  04/23/22 123 lb 6.4 oz (56 kg)  04/02/22 122 lb 6.4 oz (55.5 kg)     General: Alert, oriented x3, no distress, very slender Head: no evidence of trauma, PERRL, EOMI, no exophtalmos or lid lag, no myxedema, no xanthelasma; normal ears, nose and oropharynx Neck: normal jugular venous pulsations and no hepatojugular reflux; brisk carotid pulses without delay and no carotid bruits Chest: clear to auscultation, no signs of consolidation by percussion or palpation, normal fremitus, symmetrical and full respiratory excursions Cardiovascular: normal position and quality of the apical impulse, regular rhythm, normal first and second heart sounds, no murmurs, rubs or gallops Abdomen: no tenderness or distention, no masses by palpation, no abnormal pulsatility or arterial bruits, normal bowel sounds, no hepatosplenomegaly Extremities: no clubbing, cyanosis or edema; 2+ radial, ulnar and brachial pulses bilaterally; 2+ right femoral, posterior tibial and dorsalis pedis pulses; 2+ left femoral, posterior tibial and dorsalis pedis pulses; no subclavian or femoral bruits Neurological: grossly nonfocal Psych: Normal mood and affect     ASSESSMENT:    1. Coronary artery disease involving native coronary artery of native heart without angina pectoris   2. Chronotropic incompetence   3. Essential hypertension   4. Diabetes mellitus type 2 in nonobese (HCC)   5. Hypercholesterolemia   6. History of breast cancer   7. History of external beam radiation  therapy       PLAN:    In order of problems listed above:  Coronary artery disease: Her presentation was not with angina but with dyspnea on  exertion, even though her LVEDP was normal at the time of cardiac catheterization.  She has not had problems with shortness of breath.  It has been over 12 months since her stent.  Will discontinue the clopidogrel. Possible chronotropic incompetence there was evidence of this disorder during detailed recording of her blood pressure during activity and cardiac rehab.  However she is currently not complaining of fatigue or shortness of breath.  She has relative sinus bradycardia without medications that could cause it and would avoid beta-blockers.  Might need a pacemaker if she develops symptoms in the future. HTN: This appears to be very well-controlled on the combination of olmesartan and amlodipine and she does not have problems to lower extremity edema.  We reviewed the correct way to check her blood pressure at home, after she has had 10 to 15 minutes of time to fully relax in the seated position.  I do not think she needs to take the hydrochlorothiazide.  Will discontinue this from her medication list. DM: Has had better control in the past.  Most recent hemoglobin A1c 7.7% HLP: LDL at target on atorvastatin 80 mg daily.  She is always had a great HDL, but developed extensive coronary atherosclerosis nonetheless. History of breast cancer status post XRT: It is possible that this contributed to her development of more severe disease in the LAD artery. Weight loss: No longer losing weight.  Has a healthy weight right now.           Medication Adjustments/Labs and Tests Ordered: Current medicines are reviewed at length with the patient today.  Concerns regarding medicines are outlined above.  Orders Placed This Encounter  Procedures   EKG 12-Lead   No orders of the defined types were placed in this encounter.   Patient Instructions  Medication Instructions:  Your physician has recommended you make the following change in your medication:  STOP: Plavix and Hydrochlorothiazide  *If you need  a refill on your cardiac medications before your next appointment, please call your pharmacy*   Lab Work: NONE If you have labs (blood work) drawn today and your tests are completely normal, you will receive your results only by: [place] (if you have MyChart) OR A paper copy in the mail If you have any lab test that is abnormal or we need to change your treatment, we will call you to review the results.   Testing/Procedures: NONE   Follow-Up: At [facility], you and your health needs are our priority.  As part of our continuing mission to provide you with exceptional heart care, we have created designated Provider Care Teams.  These Care Teams include your primary Cardiologist (physician) and Advanced Practice Providers (APPs -  Physician Assistants and Nurse Practitioners) who all work together to provide you with the care you need, when you need it.  We recommend signing up for the patient portal called "MyChart".  Sign up information is provided on this After Visit Summary.  MyChart is used to connect with patients for Virtual Visits (Telemedicine).  Patients are able to view lab/test results, encounter notes, upcoming appointments, etc.  Non-urgent messages can be sent to your provider as well.   To learn more about what you can do with MyChart, go to [email or web address].    Your next appointment:  1 year(s)  Provider:   [name], MD      Signed, [name], MD  04/30/2022 2:07 PM    [place]

## 2022-05-19 ENCOUNTER — Encounter: Payer: Self-pay | Admitting: Internal Medicine

## 2022-05-19 ENCOUNTER — Ambulatory Visit (INDEPENDENT_AMBULATORY_CARE_PROVIDER_SITE_OTHER): Payer: Medicare Other | Admitting: Internal Medicine

## 2022-05-19 VITALS — BP 140/76 | HR 87 | Temp 98.5°F | Ht 62.0 in | Wt 126.2 lb

## 2022-05-19 DIAGNOSIS — I700 Atherosclerosis of aorta: Secondary | ICD-10-CM | POA: Diagnosis not present

## 2022-05-19 DIAGNOSIS — I25119 Atherosclerotic heart disease of native coronary artery with unspecified angina pectoris: Secondary | ICD-10-CM | POA: Diagnosis not present

## 2022-05-19 DIAGNOSIS — Z170 Estrogen receptor positive status [ER+]: Secondary | ICD-10-CM | POA: Diagnosis not present

## 2022-05-19 DIAGNOSIS — I119 Hypertensive heart disease without heart failure: Secondary | ICD-10-CM | POA: Diagnosis not present

## 2022-05-19 DIAGNOSIS — E1151 Type 2 diabetes mellitus with diabetic peripheral angiopathy without gangrene: Secondary | ICD-10-CM | POA: Diagnosis not present

## 2022-05-19 DIAGNOSIS — C50211 Malignant neoplasm of upper-inner quadrant of right female breast: Secondary | ICD-10-CM | POA: Diagnosis not present

## 2022-05-19 MED ORDER — CHLORTHALIDONE 25 MG PO TABS: ORAL_TABLET | ORAL | 1 refills | Status: DC

## 2022-05-19 NOTE — Progress Notes (Unsigned)
Barnet Glasgow Martin,acting as a Education administrator for Maximino Greenland, MD.,have documented all relevant documentation on the behalf of Maximino Greenland, MD,as directed by  Maximino Greenland, MD while in the presence of Maximino Greenland, MD.    Subjective:     Patient ID: Elizabeth Mcfarland , female    DOB: 1941/02/11 , 82 y.o.   MRN: [id number]   Chief Complaint  Patient presents with   Diabetes   Hypertension    HPI  Patient presents today for Januvia follow up. Patient started Januvia '100MG'$  2 weeks ago. She reports compliance with medication. Patient states she thinks it is doing better.  BP Readings from Last 3 Encounters: 05/19/22 : (!) 180/70 04/30/22 : (!) 128/58 04/23/22 : 120/70    Diabetes She presents for her follow-up diabetic visit. She has type 2 diabetes mellitus. Her disease course has been stable. There are no hypoglycemic associated symptoms. Pertinent negatives for hypoglycemia include no dizziness or headaches. Pertinent negatives for diabetes include no chest pain, no polydipsia, no polyphagia and no polyuria. There are no hypoglycemic complications. Symptoms are stable. There are no diabetic complications. Risk factors for coronary artery disease include hypertension, sedentary lifestyle and dyslipidemia. Current diabetic treatment includes oral agent (dual therapy). She is compliant with treatment all of the time. Her weight is stable. She is following a generally healthy diet. When asked about meal planning, she reported none. She participates in exercise daily (gardening). (Blood sugars was 210 last night, she took her glyburide at bedtime and her blood sugar this morning was 92 after taking a 1/2 tablet) An ACE inhibitor/angiotensin II receptor blocker is being taken. Eye exam is current.  Hypertension This is a chronic problem. The current episode started more than 1 year ago. The problem is unchanged. Pertinent negatives include no chest pain, headaches or palpitations. Past  treatments include ACE inhibitors. The current treatment provides significant improvement.     Past Medical History:  Diagnosis Date   Allergy    Anemia    Blood transfusion without reported diagnosis    Breast cancer ([place]) 11/2018   Right breast invasive mammary carcinoma   Cataract    Chronic kidney disease    Diabetes mellitus without complication ([place])    Esophageal ulcer with bleeding 05/05/2013   05/05/2013 EGD linear distal esophageal ulcer - cause of hematemesis prior to EGD    GERD (gastroesophageal reflux disease)    History of kidney stones    Hyperlipidemia    Hypertension    Kidney cysts    Personal history of radiation therapy    Sarcoidosis 1970     Family History  Problem Relation Age of Onset   Early death Mother    Stroke Father    Diabetes Sister    Kidney failure Sister    Colon polyps Sister    Stroke Brother    Colon polyps Brother    Cancer Brother 43       prostate cancer   Heart disease Brother    Emphysema Brother    Cancer Brother 73       prostate cancer   Asthma Brother    CAD Maternal Grandmother    Breast cancer Neg Hx    Colon cancer Neg Hx    Esophageal cancer Neg Hx    Rectal cancer Neg Hx    Stomach cancer Neg Hx      Current Outpatient Medications:    acetaminophen (TYLENOL) 500 MG tablet, Take 1,000 mg  by mouth every 6 (six) hours as needed for mild pain or moderate pain., Disp: , Rfl:    amLODipine (NORVASC) 10 MG tablet, TAKE 1 TABLET(10 MG) BY MOUTH DAILY, Disp: 90 tablet, Rfl: 1   anastrozole (ARIMIDEX) 1 MG tablet, Take 1 tablet (1 mg total) by mouth daily., Disp: 30 tablet, Rfl: 6   Ascorbic Acid (VITAMIN C) 1000 MG tablet, Take 1,000 mg by mouth daily., Disp: , Rfl:    aspirin EC 81 MG tablet, Take 81 mg by mouth daily., Disp: , Rfl:    atorvastatin (LIPITOR) 80 MG tablet, Take 1 tablet (80 mg total) by mouth daily., Disp: 90 tablet, Rfl: 3   chlorthalidone (HYGROTON) 25 MG tablet, TAKE 1/2 TAB PO QD, Disp: 30 tablet,  Rfl: 1   cholecalciferol (VITAMIN D) 1000 UNITS tablet, Take 1,000 Units by mouth daily., Disp: , Rfl:    glimepiride (AMARYL) 4 MG tablet, TAKE 1 TABLET BY MOUTH EVERY MORNING, Disp: 90 tablet, Rfl: 1   nitroGLYCERIN (NITROSTAT) 0.4 MG SL tablet, Place 1 tablet (0.4 mg total) under the tongue every 5 (five) minutes as needed for chest pain., Disp: 25 tablet, Rfl: 1   olmesartan (BENICAR) 40 MG tablet, TAKE 1 TABLET(40 MG) BY MOUTH DAILY, Disp: 90 tablet, Rfl: 1   ONETOUCH ULTRA test strip, USE AS DIRECTED TWICE DAILY, Disp: 100 strip, Rfl: 3   pantoprazole (PROTONIX) 40 MG tablet, Take 40 mg by mouth daily., Disp: , Rfl:    Polyethyl Glycol-Propyl Glycol (SYSTANE OP), Place 1 drop into both eyes 2 (two) times daily., Disp: , Rfl:    polyethylene glycol (MIRALAX / GLYCOLAX) packet, Take 17 g by mouth daily. , Disp: , Rfl:    sitaGLIPtin (JANUVIA) 100 MG tablet, Take 100 mg by mouth daily., Disp: , Rfl:    Allergies  Allergen Reactions   Codeine Nausea And Vomiting   Percocet [Oxycodone-Acetaminophen] Nausea And Vomiting     Review of Systems  Constitutional: Negative.   Respiratory: Negative.    Cardiovascular: Negative.  Negative for chest pain and palpitations.  Endocrine: Negative for polydipsia, polyphagia and polyuria.  Neurological: Negative.  Negative for dizziness and headaches.  Psychiatric/Behavioral: Negative.       Today's Vitals   05/19/22 1110 05/19/22 1136  BP: (!) 180/70 (!) 140/76  Pulse: 87   Temp: 98.5 F (36.9 C)   TempSrc: Oral   Weight: 126 lb 3.2 oz (57.2 kg)   Height: '5\' 2"'$  (1.575 m)   PainSc: 0-No pain    Body mass index is 23.08 kg/m.  Wt Readings from Last 3 Encounters:  05/19/22 126 lb 3.2 oz (57.2 kg)  04/30/22 123 lb 6.4 oz (56 kg)  04/23/22 123 lb 6.4 oz (56 kg)    Objective:  Physical Exam      Assessment And Plan:     1. Type 2 diabetes mellitus with diabetic peripheral angiopathy without gangrene, without long-term current use of  insulin (HCC) Comments: I will change dosing of Januvia to MWF dosing due to hypoglycemia.  2. Hypertensive heart disease without heart failure - BMP8+EGFR; Future  3. Coronary artery disease involving native coronary artery of native heart with angina pectoris ([place])  4. Malignant neoplasm of upper-inner quadrant of right breast in female, estrogen receptor positive ([place])  5. Aortic atherosclerosis ([place])     Patient was given opportunity to ask questions. Patient verbalized understanding of the plan and was able to repeat key elements of the plan. All questions were answered  to their satisfaction.  Maximino Greenland, MD   I, Maximino Greenland, MD, have reviewed all documentation for this visit. The documentation on 05/19/22 for the exam, diagnosis, procedures, and orders are all accurate and complete.   IF YOU HAVE BEEN REFERRED TO A SPECIALIST, IT MAY TAKE 1-2 WEEKS TO SCHEDULE/PROCESS THE REFERRAL. IF YOU HAVE NOT HEARD FROM US/SPECIALIST IN TWO WEEKS, PLEASE GIVE [place] A CALL AT [phone].   THE PATIENT IS ENCOURAGED TO PRACTICE SOCIAL DISTANCING DUE TO THE COVID-19 PANDEMIC.

## 2022-05-19 NOTE — Patient Instructions (Signed)

## 2022-05-30 DIAGNOSIS — E1142 Type 2 diabetes mellitus with diabetic polyneuropathy: Secondary | ICD-10-CM | POA: Diagnosis not present

## 2022-05-30 DIAGNOSIS — I10 Essential (primary) hypertension: Secondary | ICD-10-CM | POA: Diagnosis not present

## 2022-06-03 ENCOUNTER — Encounter: Payer: Medicare Other | Admitting: Internal Medicine

## 2022-06-03 NOTE — Progress Notes (Deleted)
Patient presents today for a BP check, patient currently takes amLODipine '10mg'$ , Chlorthalidone '25mg'$ , olmesartan '40mg'$ .

## 2022-06-09 ENCOUNTER — Telehealth: Payer: Self-pay

## 2022-06-09 ENCOUNTER — Other Ambulatory Visit: Payer: Self-pay

## 2022-06-09 ENCOUNTER — Other Ambulatory Visit: Payer: Self-pay | Admitting: Cardiology

## 2022-06-09 ENCOUNTER — Other Ambulatory Visit: Payer: Self-pay | Admitting: Nurse Practitioner

## 2022-06-09 MED ORDER — SITAGLIPTIN PHOSPHATE 100 MG PO TABS: 100.0000 mg | ORAL_TABLET | Freq: Every day | ORAL | 1 refills | Status: DC

## 2022-06-09 NOTE — Telephone Encounter (Signed)
Per provider patient notified. Patient aware. To stop medication Chlorthalidone 25MG . Patient reports numbness and tingling in hands. Scheduled nurse visit for next week for bpc.

## 2022-06-17 ENCOUNTER — Ambulatory Visit: Payer: Medicare Other

## 2022-06-17 VITALS — BP 120/68 | HR 50 | Temp 98.5°F | Ht 62.0 in | Wt 126.0 lb

## 2022-06-17 DIAGNOSIS — I10 Essential (primary) hypertension: Secondary | ICD-10-CM

## 2022-06-17 NOTE — Progress Notes (Signed)
Patient presents today for a bp check, patient taking amLODipine 10mg  PM, olmesartan 40mg  AM.  BP Readings from Last 3 Encounters:  06/17/22 (!) 150/70  05/19/22 (!) 140/76  04/30/22 (!) 128/58  F/u in 4 weeks with Provider.

## 2022-06-19 ENCOUNTER — Ambulatory Visit (INDEPENDENT_AMBULATORY_CARE_PROVIDER_SITE_OTHER): Payer: Medicare Other

## 2022-06-19 ENCOUNTER — Ambulatory Visit: Payer: Medicare Other | Admitting: Nurse Practitioner

## 2022-06-19 VITALS — Ht 62.0 in | Wt 126.0 lb

## 2022-06-19 DIAGNOSIS — Z0000 Encounter for general adult medical examination without abnormal findings: Secondary | ICD-10-CM | POA: Diagnosis not present

## 2022-06-19 NOTE — Progress Notes (Signed)
I connected with  Angelene Giovanni on 06/19/22 by a audio enabled telemedicine application and verified that I am speaking with the correct person using two identifiers.  Patient Location: Home  Provider Location: Office/Clinic  I discussed the limitations of evaluation and management by telemedicine. The patient expressed understanding and agreed to proceed.  Subjective:   Elizabeth Mcfarland is a 82 y.o. female who presents for Medicare Annual (Subsequent) preventive examination.  Review of Systems     Cardiac Risk Factors include: advanced age (>66men, >41 women);diabetes mellitus;hypertension     Objective:    Today's Vitals   06/19/22 0941  Weight: 126 lb (57.2 kg)  Height: 5\' 2"  (1.575 m)   Body mass index is 23.05 kg/m.     06/19/2022    9:46 AM 06/12/2021   10:28 AM 04/08/2021    7:19 AM 08/02/2020    2:08 PM 04/05/2020   10:23 AM 04/02/2020    9:25 AM 08/01/2019    9:23 AM  Advanced Directives  Does Patient Have a Medical Advance Directive? No No No No No No No  Would patient like information on creating a medical advance directive?  Yes (MAU/Ambulatory/Procedural Areas - Information given) No - Patient declined  No - Patient declined Yes (MAU/Ambulatory/Procedural Areas - Information given)     Current Medications (verified) Outpatient Encounter Medications as of 06/19/2022  Medication Sig   acetaminophen (TYLENOL) 500 MG tablet Take 1,000 mg by mouth every 6 (six) hours as needed for mild pain or moderate pain.   amLODipine (NORVASC) 10 MG tablet TAKE 1 TABLET(10 MG) BY MOUTH DAILY   anastrozole (ARIMIDEX) 1 MG tablet TAKE 1 TABLET(1 MG) BY MOUTH DAILY   Ascorbic Acid (VITAMIN C) 1000 MG tablet Take 1,000 mg by mouth daily.   aspirin EC 81 MG tablet Take 81 mg by mouth daily.   atorvastatin (LIPITOR) 80 MG tablet Take 1 tablet (80 mg total) by mouth daily.   cholecalciferol (VITAMIN D) 1000 UNITS tablet Take 1,000 Units by mouth daily.   glimepiride (AMARYL) 4 MG tablet  TAKE 1 TABLET BY MOUTH EVERY MORNING   metoprolol succinate (TOPROL-XL) 25 MG 24 hr tablet TAKE ONE-HALF OF A TABLET BY MOUTH DAILY   olmesartan (BENICAR) 40 MG tablet TAKE 1 TABLET(40 MG) BY MOUTH DAILY   ONETOUCH ULTRA test strip USE AS DIRECTED TWICE DAILY   pantoprazole (PROTONIX) 40 MG tablet TAKE 1 TABLET BY MOUTH DAILY   Polyethyl Glycol-Propyl Glycol (SYSTANE OP) Place 1 drop into both eyes 2 (two) times daily.   polyethylene glycol (MIRALAX / GLYCOLAX) packet Take 17 g by mouth daily.    sitaGLIPtin (JANUVIA) 100 MG tablet Take 1 tablet (100 mg total) by mouth daily.   chlorthalidone (HYGROTON) 25 MG tablet TAKE 1/2 TAB PO QD (Patient not taking: Reported on 06/17/2022)   nitroGLYCERIN (NITROSTAT) 0.4 MG SL tablet Place 1 tablet (0.4 mg total) under the tongue every 5 (five) minutes as needed for chest pain.   No facility-administered encounter medications on file as of 06/19/2022.    Allergies (verified) Codeine and Percocet [oxycodone-acetaminophen]   History: Past Medical History:  Diagnosis Date   Allergy    Anemia    Blood transfusion without reported diagnosis    Breast cancer ([place]) 11/2018   Right breast invasive mammary carcinoma   Cataract    Chronic kidney disease    Diabetes mellitus without complication ([place])    Esophageal ulcer with bleeding 05/05/2013   05/05/2013 EGD linear distal  esophageal ulcer - cause of hematemesis prior to EGD    GERD (gastroesophageal reflux disease)    History of kidney stones    Hyperlipidemia    Hypertension    Kidney cysts    Personal history of radiation therapy    Sarcoidosis 1970   Past Surgical History:  Procedure Laterality Date   ABDOMINAL HYSTERECTOMY     BREAST BIOPSY Left 07/09/2021   BREAST EXCISIONAL BIOPSY Left 11/2018   Fibroadenoma   BREAST LUMPECTOMY     BREAST LUMPECTOMY WITH RADIOACTIVE SEED AND SENTINEL LYMPH NODE BIOPSY Bilateral 11/25/2018   Procedure: BILATERAL BREAST LUMPECTOMIES  WITH BILATERAL   RADIOACTIVE SEEDS AND RIGHT SENTINEL LYMPH NODE BIOPSY;  Surgeon: [name], MD;  Location: [facility];  Service: General;  Laterality: Bilateral;   CATARACT EXTRACTION W/ INTRAOCULAR LENS IMPLANT     COLONOSCOPY     COLONOSCOPY, ESOPHAGOGASTRODUODENOSCOPY (EGD) AND ESOPHAGEAL DILATION N/A 05/05/2013   Procedure: COLONOSCOPY, ESOPHAGOGASTRODUODENOSCOPY (EGD) AND ESOPHAGEAL DILATION (ED);  Surgeon: Gatha Mayer, MD;  Location: [facility];  Service: Endoscopy;  Laterality: N/A;   LEFT HEART CATH AND CORONARY ANGIOGRAPHY N/A 04/08/2021   Procedure: LEFT HEART CATH AND CORONARY ANGIOGRAPHY;  Surgeon: Sherren Mocha, MD;  Location: [facility];  Service: Cardiovascular;  Laterality: N/A;   LITHOTRIPSY     POLYPECTOMY     UPPER GASTROINTESTINAL ENDOSCOPY     Family History  Problem Relation Age of Onset   Early death Mother    Stroke Father    Diabetes Sister    Kidney failure Sister    Colon polyps Sister    Stroke Brother    Colon polyps Brother    Cancer Brother 61       prostate cancer   Heart disease Brother    Emphysema Brother    Cancer Brother 5       prostate cancer   Asthma Brother    CAD Maternal Grandmother    Breast cancer Neg Hx    Colon cancer Neg Hx    Esophageal cancer Neg Hx    Rectal cancer Neg Hx    Stomach cancer Neg Hx    Social History   Socioeconomic History   Marital status: Single    Spouse name: Not on file   Number of children: 1   Years of education: Not on file   Highest education level: GED or equivalent  Occupational History   Occupation: Physiological scientist: SELF-EMPLOYED   Occupation: retired  Tobacco Use   Smoking status: Never   Smokeless tobacco: Never  Scientific laboratory technician Use: Never used  Substance and Sexual Activity   Alcohol use: No   Drug use: No   Sexual activity: Not Currently  Other Topics Concern   Not on file  Social History Narrative   Lives alone   Social Determinants of Health   Financial Resource  Strain: Low Risk  (06/19/2022)   Overall Financial Resource Strain (CARDIA)    Difficulty of Paying Living Expenses: Not hard at all  Food Insecurity: No Food Insecurity (06/19/2022)   Hunger Vital Sign    Worried About Running Out of Food in the Last Year: Never true    Ran Out of Food in the Last Year: Never true  Transportation Needs: No Transportation Needs (06/19/2022)   PRAPARE - Hydrologist (Medical): No    Lack of Transportation (Non-Medical): No  Physical Activity: Inactive (06/19/2022)  Exercise Vital Sign    Days of Exercise per Week: 0 days    Minutes of Exercise per Session: 0 min  Stress: No Stress Concern Present (06/19/2022)   [place]    Feeling of Stress : Not at all  Social Connections: Not on file    Tobacco Counseling Counseling given: Not Answered   Clinical Intake:  Pre-visit preparation completed: Yes  Pain : No/denies pain     Nutritional Status: BMI of 19-24  Normal Nutritional Risks: None Diabetes: Yes  How often do you need to have someone help you when you read instructions, pamphlets, or other written materials from your doctor or pharmacy?: 1 - Never  Diabetic? Yes Nutrition Risk Assessment:  Has the patient had any N/V/D within the last 2 months?  No  Does the patient have any non-healing wounds?  No  Has the patient had any unintentional weight loss or weight gain?  No   Diabetes:  Is the patient diabetic?  Yes  If diabetic, was a CBG obtained today?  No  Did the patient bring in their glucometer from home?  No  How often do you monitor your CBG's? daily.   Financial Strains and Diabetes Management:  Are you having any financial strains with the device, your supplies or your medication? No .  Does the patient want to be seen by Chronic Care Management for management of their diabetes?  No  Would the patient like to be referred to a  Nutritionist or for Diabetic Management?  No   Diabetic Exams:  Diabetic Eye Exam: Completed 04/2022 Diabetic Foot Exam: Completed 06/12/2021   Interpreter Needed?: No  Information entered by :: NAllen LPN   Activities of Daily Living    06/19/2022    9:47 AM  In your present state of health, do you have any difficulty performing the following activities:  Hearing? 0  Vision? 0  Difficulty concentrating or making decisions? 0  Walking or climbing stairs? 0  Dressing or bathing? 0  Doing errands, shopping? 0  Preparing Food and eating ? N  Using the Toilet? N  In the past six months, have you accidently leaked urine? N  Do you have problems with loss of bowel control? N  Managing your Medications? N  Managing your Finances? N  Housekeeping or managing your Housekeeping? N    Patient Care Team: Glendale Chard, MD as PCP - General (Internal Medicine) [name], [name], MD as PCP - Cardiology (Cardiology) [name], RN as Oncology Nurse Navigator [name], RN as Oncology Nurse Navigator [name], MD as Consulting Physician (Hematology) [name], MD as Consulting Physician (General Surgery) [name], MD as Consulting Physician (Radiation Oncology) Alla Feeling, NP as Nurse Practitioner (Nurse Practitioner) Mayford Knife, [place] (Pharmacist) [place], Heather, OD (Optometry)  Indicate any recent Medical Services you may have received from other than [facility] providers in the past year (date may be approximate).     Assessment:   This is a routine wellness examination for [facility].  Hearing/Vision screen Vision Screening - Comments:: Regular eye exams, [facility]  Dietary issues and exercise activities discussed: Current Exercise Habits: The patient does not participate in regular exercise at present (works in the yard)   [place]             This Visit's Progress    Patient Stated       06/19/2022, wants to gain more  weight and get  blood sugar and BP under control       Depression Screen    06/19/2022    9:47 AM 11/27/2021    9:04 AM 09/02/2021   11:49 AM 06/25/2021    9:54 AM 06/12/2021   10:30 AM 08/02/2020    2:11 PM 07/19/2020    9:32 AM  PHQ 2/9 Scores  PHQ - 2 Score 0 0 0 0 0 0 0  PHQ- 9 Score    0       Fall Risk    06/19/2022    9:47 AM 11/27/2021    9:04 AM 06/25/2021    9:15 AM 06/12/2021   10:30 AM 08/02/2020    2:09 PM  Fall Risk   Falls in the past year? 0 1 0 0 1  Comment     tripped in garden  Number falls in past yr: 0 0   0  Injury with Fall? 0 0   1  Comment     hurt knee  Risk for fall due to : Medication side effect History of fall(s)  Medication side effect Medication side effect  Follow up Falls prevention discussed;Education provided;Falls evaluation completed Falls evaluation completed Falls evaluation completed Falls evaluation completed;Education provided;Falls prevention discussed Falls evaluation completed;Education provided;Falls prevention discussed    FALL RISK PREVENTION PERTAINING TO THE HOME:  Any stairs in or around the home? No  If so, are there any without handrails? N/a Home free of loose throw rugs in walkways, pet beds, electrical cords, etc? Yes  Adequate lighting in your home to reduce risk of falls? Yes   ASSISTIVE DEVICES UTILIZED TO PREVENT FALLS:  Life alert? No  Use of a cane, walker or w/c? No  Grab bars in the bathroom? No  Shower chair or bench in shower? No  Elevated toilet seat or a handicapped toilet? Yes   TIMED UP AND GO:      Cognitive Function:        06/19/2022    9:49 AM 06/12/2021   10:32 AM 08/02/2020    2:14 PM 07/14/2019    9:17 AM 07/08/2018   10:19 AM  6CIT Screen  What Year? 0 points 0 points 0 points 0 points 0 points  What month? 0 points 0 points 0 points 0 points 0 points  What time? 0 points 0 points 0 points 0 points 0 points  Count back from 20 0 points 0 points 2 points 0 points 0 points  Months in reverse 0 points 0  points 0 points 0 points 0 points  Repeat phrase 0 points 6 points 0 points 2 points 0 points  Total Score 0 points 6 points 2 points 2 points 0 points    Immunizations Immunization History  Administered Date(s) Administered   Fluad Quad(high Dose 65+) 12/20/2019, 02/04/2022   Influenza, High Dose Seasonal PF 01/13/2018, 01/05/2019   Influenza-Unspecified 01/15/2021   PFIZER(Purple Top)SARS-COV-2 Vaccination 05/03/2019, 05/21/2019, 01/04/2020, 07/30/2020   Pfizer Covid-19 Vaccine Bivalent Booster 44yrs & up 01/01/2021   Pneumococcal Conjugate-13 04/11/2019   Pneumococcal Polysaccharide-23 09/11/2020   Td 03/24/2010   Tdap 06/12/2021   Zoster Recombinat (Shingrix) 08/27/2021, 10/29/2021    TDAP status: Up to date  Flu Vaccine status: Up to date  Pneumococcal vaccine status: Up to date  Covid-19 vaccine status: Completed vaccines  Qualifies for Shingles Vaccine? Yes   Zostavax completed Yes   Shingrix Completed?: Yes  Screening Tests Health Maintenance  Topic Date Due  COVID-19 Vaccine (6 - 2023-24 season) 11/22/2021   OPHTHALMOLOGY EXAM  02/05/2022   Medicare Annual Wellness (AWV)  06/13/2022   FOOT EXAM  06/13/2022   Diabetic kidney evaluation - Urine ACR  08/28/2022   HEMOGLOBIN A1C  10/01/2022   Diabetic kidney evaluation - eGFR measurement  04/24/2023   DTaP/Tdap/Td (3 - Td or Tdap) 06/13/2031   Pneumonia Vaccine 47+ Years old  Completed   INFLUENZA VACCINE  Completed   DEXA SCAN  Completed   Hepatitis C Screening  Completed   Zoster Vaccines- Shingrix  Completed   HPV VACCINES  Aged Out    Health Maintenance  Health Maintenance Due  Topic Date Due   COVID-19 Vaccine (6 - 2023-24 season) 11/22/2021   OPHTHALMOLOGY EXAM  02/05/2022   Medicare Annual Wellness (AWV)  06/13/2022   FOOT EXAM  06/13/2022    Colorectal cancer screening: No longer required.   Mammogram status: Completed 01/09/2022. Repeat every 6 months  Bone Density status: scheduled for  10/13/2022  Lung Cancer Screening: (Low Dose CT Chest recommended if Age 6-80 years, 30 pack-year currently smoking OR have quit w/in 15years.) does not qualify.   Lung Cancer Screening Referral: no  Additional Screening:  Hepatitis C Screening: does qualify; Completed 10/13/2019  Vision Screening: Recommended annual ophthalmology exams for early detection of glaucoma and other disorders of the eye. Is the patient up to date with their annual eye exam?  Yes  Who is the provider or what is the name of the office in which the patient attends annual eye exams? [facility] If pt is not established with a provider, would they like to be referred to a provider to establish care? No .   Dental Screening: Recommended annual dental exams for proper oral hygiene  Community Resource Referral / Chronic Care Management: CRR required this visit?  No   CCM required this visit?  No      Plan:     I have personally reviewed and noted the following in the patient's chart:   Medical and social history Use of alcohol, tobacco or illicit drugs  Current medications and supplements including opioid prescriptions. Patient is not currently taking opioid prescriptions. Functional ability and status Nutritional status Physical activity Advanced directives List of other physicians Hospitalizations, surgeries, and ER visits in previous 12 months Vitals Screenings to include cognitive, depression, and falls Referrals and appointments  In addition, I have reviewed and discussed with patient certain preventive protocols, quality metrics, and best practice recommendations. A written personalized care plan for preventive services as well as general preventive health recommendations were provided to patient.     Kellie Simmering, LPN   [id number]   Nurse Notes: none  Due to this being a virtual visit, the after visit summary with patients personalized plan was offered to patient via mail or my-chart.   to pick up at office at next visit

## 2022-06-19 NOTE — Patient Instructions (Signed)
Elizabeth Mcfarland , Thank you for taking time to come for your Medicare Wellness Visit. I appreciate your ongoing commitment to your health goals. Please review the following plan we discussed and let me know if I can assist you in the future.   These are the goals we discussed:  Goals      Patient Stated     No goals     Patient Stated     07/14/2019, wants to have regular bowel movements without taking medicine     Patient Stated     08/02/2020, find out what is going on with pancreas and stand up straighter     Patient Stated     06/12/2021, wants to gain weight     Patient Stated     06/19/2022, wants to gain more weight and get blood sugar and BP under control     Track and Manage My Blood Pressure-Hypertension     Timeframe:  Long-Range Goal Priority:  High Start Date:                             Expected End Date:                       Follow Up Date 03/07/2022   In Progress:  - check blood pressure daily - choose a place to take my blood pressure (home, clinic or office, retail store) - write blood pressure results in a log or diary    Why is this important?   You won't feel high blood pressure, but it can still hurt your blood vessels.  High blood pressure can cause heart or kidney problems. It can also cause a stroke.  Making lifestyle changes like losing a little weight or eating less salt will help.  Checking your blood pressure at home and at different times of the day can help to control blood pressure.  If the doctor prescribes medicine remember to take it the way the doctor ordered.  Call the office if you cannot afford the medicine or if there are questions about it.     Notes:         This is a list of the screening recommended for you and due dates:  Health Maintenance  Topic Date Due   COVID-19 Vaccine (6 - 2023-24 season) 11/22/2021   Eye exam for diabetics  02/05/2022   Complete foot exam   06/13/2022   Yearly kidney health urinalysis for diabetes   08/28/2022   Hemoglobin A1C  10/01/2022   Yearly kidney function blood test for diabetes  04/24/2023   Medicare Annual Wellness Visit  06/19/2023   DTaP/Tdap/Td vaccine (3 - Td or Tdap) 06/13/2031   Pneumonia Vaccine  Completed   Flu Shot  Completed   DEXA scan (bone density measurement)  Completed   Hepatitis C Screening: USPSTF Recommendation to screen - Ages 48-79 yo.  Completed   Zoster (Shingles) Vaccine  Completed   HPV Vaccine  Aged Out    Advanced directives: Advance directive discussed with you today.   Conditions/risks identified: none  Next appointment: Follow up in one year for your annual wellness visit    Preventive Care 65 Years and Older, Female Preventive care refers to lifestyle choices and visits with your health care provider that can promote health and wellness. What does preventive care include? A yearly physical exam. This is also called an annual well check. Dental exams  once or twice a year. Routine eye exams. Ask your health care provider how often you should have your eyes checked. Personal lifestyle choices, including: Daily care of your teeth and gums. Regular physical activity. Eating a healthy diet. Avoiding tobacco and drug use. Limiting alcohol use. Practicing safe sex. Taking low-dose aspirin every day. Taking vitamin and mineral supplements as recommended by your health care provider. What happens during an annual well check? The services and screenings done by your health care provider during your annual well check will depend on your age, overall health, lifestyle risk factors, and family history of disease. Counseling  Your health care provider may ask you questions about your: Alcohol use. Tobacco use. Drug use. Emotional well-being. Home and relationship well-being. Sexual activity. Eating habits. History of falls. Memory and ability to understand (cognition). Work and work Statistician. Reproductive health. Screening  You may  have the following tests or measurements: Height, weight, and BMI. Blood pressure. Lipid and cholesterol levels. These may be checked every 5 years, or more frequently if you are over 66 years old. Skin check. Lung cancer screening. You may have this screening every year starting at age 74 if you have a 30-pack-year history of smoking and currently smoke or have quit within the past 15 years. Fecal occult blood test (FOBT) of the stool. You may have this test every year starting at age 41. Flexible sigmoidoscopy or colonoscopy. You may have a sigmoidoscopy every 5 years or a colonoscopy every 10 years starting at age 39. Hepatitis C blood test. Hepatitis B blood test. Sexually transmitted disease (STD) testing. Diabetes screening. This is done by checking your blood sugar (glucose) after you have not eaten for a while (fasting). You may have this done every 1-3 years. Bone density scan. This is done to screen for osteoporosis. You may have this done starting at age 44. Mammogram. This may be done every 1-2 years. Talk to your health care provider about how often you should have regular mammograms. Talk with your health care provider about your test results, treatment options, and if necessary, the need for more tests. Vaccines  Your health care provider may recommend certain vaccines, such as: Influenza vaccine. This is recommended every year. Tetanus, diphtheria, and acellular pertussis (Tdap, Td) vaccine. You may need a Td booster every 10 years. Zoster vaccine. You may need this after age 37. Pneumococcal 13-valent conjugate (PCV13) vaccine. One dose is recommended after age 63. Pneumococcal polysaccharide (PPSV23) vaccine. One dose is recommended after age 46. Talk to your health care provider about which screenings and vaccines you need and how often you need them. This information is not intended to replace advice given to you by your health care provider. Make sure you discuss any  questions you have with your health care provider. Document Released: 04/06/2015 Document Revised: 11/28/2015 Document Reviewed: 01/09/2015 Elsevier Interactive Patient Education  2017 [place] Prevention in the Home Falls can cause injuries. They can happen to people of all ages. There are many things you can do to make your home safe and to help prevent falls. What can I do on the outside of my home? Regularly fix the edges of walkways and driveways and fix any cracks. Remove anything that might make you trip as you walk through a door, such as a raised step or threshold. Trim any bushes or trees on the path to your home. Use bright outdoor lighting. Clear any walking paths of anything that might make someone trip,  such as rocks or tools. Regularly check to see if handrails are loose or broken. Make sure that both sides of any steps have handrails. Any raised decks and porches should have guardrails on the edges. Have any leaves, snow, or ice cleared regularly. Use sand or salt on walking paths during winter. Clean up any spills in your garage right away. This includes oil or grease spills. What can I do in the bathroom? Use night lights. Install grab bars by the toilet and in the tub and shower. Do not use towel bars as grab bars. Use non-skid mats or decals in the tub or shower. If you need to sit down in the shower, use a plastic, non-slip stool. Keep the floor dry. Clean up any water that spills on the floor as soon as it happens. Remove soap buildup in the tub or shower regularly. Attach bath mats securely with double-sided non-slip rug tape. Do not have throw rugs and other things on the floor that can make you trip. What can I do in the bedroom? Use night lights. Make sure that you have a light by your bed that is easy to reach. Do not use any sheets or blankets that are too big for your bed. They should not hang down onto the floor. Have a firm chair that has side  arms. You can use this for support while you get dressed. Do not have throw rugs and other things on the floor that can make you trip. What can I do in the kitchen? Clean up any spills right away. Avoid walking on wet floors. Keep items that you use a lot in easy-to-reach places. If you need to reach something above you, use a strong step stool that has a grab bar. Keep electrical cords out of the way. Do not use floor polish or wax that makes floors slippery. If you must use wax, use non-skid floor wax. Do not have throw rugs and other things on the floor that can make you trip. What can I do with my stairs? Do not leave any items on the stairs. Make sure that there are handrails on both sides of the stairs and use them. Fix handrails that are broken or loose. Make sure that handrails are as long as the stairways. Check any carpeting to make sure that it is firmly attached to the stairs. Fix any carpet that is loose or worn. Avoid having throw rugs at the top or bottom of the stairs. If you do have throw rugs, attach them to the floor with carpet tape. Make sure that you have a light switch at the top of the stairs and the bottom of the stairs. If you do not have them, ask someone to add them for you. What else can I do to help prevent falls? Wear shoes that: Do not have high heels. Have rubber bottoms. Are comfortable and fit you well. Are closed at the toe. Do not wear sandals. If you use a stepladder: Make sure that it is fully opened. Do not climb a closed stepladder. Make sure that both sides of the stepladder are locked into place. Ask someone to hold it for you, if possible. Clearly mark and make sure that you can see: Any grab bars or handrails. First and last steps. Where the edge of each step is. Use tools that help you move around (mobility aids) if they are needed. These include: Canes. Walkers. Scooters. Crutches. Turn on the lights when you go into a  dark area.  Replace any light bulbs as soon as they burn out. Set up your furniture so you have a clear path. Avoid moving your furniture around. If any of your floors are uneven, fix them. If there are any pets around you, be aware of where they are. Review your medicines with your doctor. Some medicines can make you feel dizzy. This can increase your chance of falling. Ask your doctor what other things that you can do to help prevent falls. This information is not intended to replace advice given to you by your health care provider. Make sure you discuss any questions you have with your health care provider. Document Released: 01/04/2009 Document Revised: 08/16/2015 Document Reviewed: 04/14/2014 Elsevier Interactive Patient Education  2017 [employer].

## 2022-06-22 DIAGNOSIS — E1142 Type 2 diabetes mellitus with diabetic polyneuropathy: Secondary | ICD-10-CM | POA: Diagnosis not present

## 2022-06-22 DIAGNOSIS — I10 Essential (primary) hypertension: Secondary | ICD-10-CM | POA: Diagnosis not present

## 2022-06-28 NOTE — Progress Notes (Signed)
No show nurse visit

## 2022-06-29 DIAGNOSIS — I10 Essential (primary) hypertension: Secondary | ICD-10-CM | POA: Diagnosis not present

## 2022-06-29 DIAGNOSIS — E1142 Type 2 diabetes mellitus with diabetic polyneuropathy: Secondary | ICD-10-CM | POA: Diagnosis not present

## 2022-07-17 ENCOUNTER — Encounter: Payer: Self-pay | Admitting: Internal Medicine

## 2022-07-17 NOTE — Progress Notes (Deleted)
Subjective:     Patient ID: Elizabeth Mcfarland , female    DOB: 12-16-1940 , 82 y.o.   MRN: [id number]   Chief Complaint  Patient presents with   Hypertension   Diabetes    HPI  Pt presents today for bp & dm f/u. Denies headache, chest pain, SOB.    Hypertension This is a chronic problem. The current episode started more than 1 year ago. The problem is unchanged. Pertinent negatives include no chest pain, headaches or palpitations. Past treatments include ACE inhibitors. The current treatment provides significant improvement.  Diabetes She presents for her follow-up diabetic visit. She has type 2 diabetes mellitus. Her disease course has been stable. There are no hypoglycemic associated symptoms. Pertinent negatives for hypoglycemia include no dizziness or headaches. Pertinent negatives for diabetes include no chest pain, no fatigue, no polydipsia, no polyphagia and no polyuria. There are no hypoglycemic complications. Symptoms are stable. There are no diabetic complications. Risk factors for coronary artery disease include hypertension, sedentary lifestyle and dyslipidemia. Current diabetic treatment includes oral agent (dual therapy). She is compliant with treatment all of the time. Her weight is stable. She is following a generally healthy diet. When asked about meal planning, she reported none. She participates in exercise daily (gardening). (Blood sugars was 210 last night, she took her glyburide at bedtime and her blood sugar this morning was 92 after taking a 1/2 tablet) An ACE inhibitor/angiotensin II receptor blocker is being taken. Eye exam is current.     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   Cataract    Chronic kidney disease    Diabetes mellitus without complication (HCC)    Esophageal ulcer with bleeding 05/05/2013   05/05/2013 EGD  linear distal esophageal ulcer - cause of hematemesis prior to EGD    GERD (gastroesophageal reflux disease)    History of kidney stones    Hyperlipidemia    Hypertension    Kidney cysts    Personal history of radiation therapy    Sarcoidosis 1970     Family History  Problem Relation Age of Onset   Early death Mother    Stroke Father    Diabetes Sister    Kidney failure Sister    Colon polyps Sister    Stroke Brother    Colon polyps Brother    Cancer Brother 83       prostate cancer   Heart disease Brother    Emphysema Brother    Cancer Brother 59       prostate cancer   Asthma Brother    CAD Maternal Grandmother    Breast cancer Neg Hx    Colon cancer Neg Hx    Esophageal cancer Neg Hx    Rectal cancer Neg Hx    Stomach cancer Neg Hx      Current Outpatient Medications:    acetaminophen (TYLENOL) 500 MG tablet, Take 1,000 mg by mouth every 6 (six) hours as needed for mild pain or moderate pain., Disp: , Rfl:    amLODipine (NORVASC) 10 MG tablet, TAKE 1 TABLET(10 MG) BY MOUTH DAILY, Disp: 90 tablet, Rfl: 1   anastrozole (ARIMIDEX) 1 MG tablet, TAKE 1 TABLET(1 MG) BY MOUTH DAILY, Disp: 30 tablet, Rfl: 6   Ascorbic Acid (VITAMIN C) 1000 MG tablet, Take 1,000 mg by mouth  daily., Disp: , Rfl:    aspirin EC 81 MG tablet, Take 81 mg by mouth daily., Disp: , Rfl:    atorvastatin (LIPITOR) 80 MG tablet, Take 1 tablet (80 mg total) by mouth daily., Disp: 90 tablet, Rfl: 3   chlorthalidone (HYGROTON) 25 MG tablet, TAKE 1/2 TAB PO QD (Patient not taking: Reported on 06/17/2022), Disp: 30 tablet, Rfl: 1   cholecalciferol (VITAMIN D) 1000 UNITS tablet, Take 1,000 Units by mouth daily., Disp: , Rfl:    glimepiride (AMARYL) 4 MG tablet, TAKE 1 TABLET BY MOUTH EVERY MORNING, Disp: 90 tablet, Rfl: 1   metoprolol succinate (TOPROL-XL) 25 MG 24 hr tablet, TAKE ONE-HALF OF A TABLET BY MOUTH DAILY, Disp: 45 tablet, Rfl: 3   nitroGLYCERIN (NITROSTAT) 0.4 MG SL tablet, Place 1 tablet (0.4 mg  total) under the tongue every 5 (five) minutes as needed for chest pain., Disp: 25 tablet, Rfl: 1   olmesartan (BENICAR) 40 MG tablet, TAKE 1 TABLET(40 MG) BY MOUTH DAILY, Disp: 90 tablet, Rfl: 1   ONETOUCH ULTRA test strip, USE AS DIRECTED TWICE DAILY, Disp: 100 strip, Rfl: 3   pantoprazole (PROTONIX) 40 MG tablet, TAKE 1 TABLET BY MOUTH DAILY, Disp: 90 tablet, Rfl: 3   Polyethyl Glycol-Propyl Glycol (SYSTANE OP), Place 1 drop into both eyes 2 (two) times daily., Disp: , Rfl:    polyethylene glycol (MIRALAX / GLYCOLAX) packet, Take 17 g by mouth daily. , Disp: , Rfl:    sitaGLIPtin (JANUVIA) 100 MG tablet, Take 1 tablet (100 mg total) by mouth daily., Disp: 30 tablet, Rfl: 1   Allergies  Allergen Reactions   Codeine Nausea And Vomiting   Percocet [Oxycodone-Acetaminophen] Nausea And Vomiting     Review of Systems  Constitutional: Negative.  Negative for fatigue.  Respiratory: Negative.    Cardiovascular: Negative.  Negative for chest pain and palpitations.  Endocrine: Negative for polydipsia, polyphagia and polyuria.  Neurological: Negative.  Negative for dizziness and headaches.  Psychiatric/Behavioral: Negative.       There were no vitals filed for this visit. There is no height or weight on file to calculate BMI.   Objective:  Physical Exam      Assessment And Plan:     1. Essential hypertension  2. 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 Patient verbalized understanding of the plan and was able to repeat key elements of the plan. All questions were answered to their satisfaction.  Coolidge Breeze, CMA   I, Coolidge Breeze, CMA, have reviewed all documentation for this visit. The documentation on 07/17/22 for the exam, diagnosis,  procedures, and orders are all accurate and complete.   IF YOU HAVE BEEN REFERRED TO A SPECIALIST, IT MAY TAKE 1-2 WEEKS TO SCHEDULE/PROCESS THE REFERRAL. IF YOU HAVE NOT HEARD FROM US/SPECIALIST IN TWO WEEKS, PLEASE GIVE [place] A CALL AT [phone] X [phone].   THE PATIENT IS ENCOURAGED TO PRACTICE SOCIAL DISTANCING DUE TO THE COVID-19 PANDEMIC.

## 2022-07-20 NOTE — Progress Notes (Signed)
This encounter was created in error - please disregard.

## 2022-07-22 DIAGNOSIS — E1142 Type 2 diabetes mellitus with diabetic polyneuropathy: Secondary | ICD-10-CM | POA: Diagnosis not present

## 2022-07-22 DIAGNOSIS — I10 Essential (primary) hypertension: Secondary | ICD-10-CM | POA: Diagnosis not present

## 2022-07-29 DIAGNOSIS — I10 Essential (primary) hypertension: Secondary | ICD-10-CM | POA: Diagnosis not present

## 2022-07-29 DIAGNOSIS — E1142 Type 2 diabetes mellitus with diabetic polyneuropathy: Secondary | ICD-10-CM | POA: Diagnosis not present

## 2022-08-05 ENCOUNTER — Ambulatory Visit: Payer: Medicare Other | Admitting: Nurse Practitioner

## 2022-08-05 DIAGNOSIS — I10 Essential (primary) hypertension: Secondary | ICD-10-CM | POA: Diagnosis not present

## 2022-08-05 DIAGNOSIS — E1142 Type 2 diabetes mellitus with diabetic polyneuropathy: Secondary | ICD-10-CM | POA: Diagnosis not present

## 2022-08-11 ENCOUNTER — Other Ambulatory Visit: Payer: Self-pay | Admitting: Internal Medicine

## 2022-08-11 DIAGNOSIS — H401131 Primary open-angle glaucoma, bilateral, mild stage: Secondary | ICD-10-CM | POA: Diagnosis not present

## 2022-08-22 DIAGNOSIS — I10 Essential (primary) hypertension: Secondary | ICD-10-CM | POA: Diagnosis not present

## 2022-08-22 DIAGNOSIS — E1142 Type 2 diabetes mellitus with diabetic polyneuropathy: Secondary | ICD-10-CM | POA: Diagnosis not present

## 2022-08-28 ENCOUNTER — Encounter: Payer: Self-pay | Admitting: Nurse Practitioner

## 2022-08-28 ENCOUNTER — Ambulatory Visit (INDEPENDENT_AMBULATORY_CARE_PROVIDER_SITE_OTHER): Payer: Medicare Other | Admitting: Nurse Practitioner

## 2022-08-28 ENCOUNTER — Ambulatory Visit
Admission: RE | Admit: 2022-08-28 | Discharge: 2022-08-28 | Disposition: A | Discharge status: Home / Self Care | Payer: Medicare Other | Source: Ambulatory Visit | Attending: Nurse Practitioner | Admitting: Nurse Practitioner

## 2022-08-28 VITALS — BP 150/70 | HR 66 | Temp 98.1°F | Ht 62.0 in | Wt 125.8 lb

## 2022-08-28 DIAGNOSIS — Z2821 Immunization not carried out because of patient refusal: Secondary | ICD-10-CM

## 2022-08-28 DIAGNOSIS — E1151 Type 2 diabetes mellitus with diabetic peripheral angiopathy without gangrene: Secondary | ICD-10-CM

## 2022-08-28 DIAGNOSIS — G8929 Other chronic pain: Secondary | ICD-10-CM | POA: Diagnosis not present

## 2022-08-28 DIAGNOSIS — E782 Mixed hyperlipidemia: Secondary | ICD-10-CM

## 2022-08-28 DIAGNOSIS — Z0000 Encounter for general adult medical examination without abnormal findings: Secondary | ICD-10-CM

## 2022-08-28 DIAGNOSIS — E1142 Type 2 diabetes mellitus with diabetic polyneuropathy: Secondary | ICD-10-CM | POA: Diagnosis not present

## 2022-08-28 DIAGNOSIS — Z0001 Encounter for general adult medical examination with abnormal findings: Secondary | ICD-10-CM

## 2022-08-28 DIAGNOSIS — M545 Low back pain, unspecified: Secondary | ICD-10-CM

## 2022-08-28 DIAGNOSIS — Z79899 Other long term (current) drug therapy: Secondary | ICD-10-CM

## 2022-08-28 DIAGNOSIS — R202 Paresthesia of skin: Secondary | ICD-10-CM

## 2022-08-28 DIAGNOSIS — I10 Essential (primary) hypertension: Secondary | ICD-10-CM

## 2022-08-28 LAB — POCT URINALYSIS DIPSTICK
Bilirubin, UA: NEGATIVE
Glucose, UA: NEGATIVE
Ketones, UA: NEGATIVE
Leukocytes, UA: NEGATIVE
Nitrite, UA: NEGATIVE
Protein, UA: NEGATIVE
Spec Grav, UA: 1.025 (ref 1.010–1.025)
Urobilinogen, UA: 0.2 E.U./dL
pH, UA: 7 (ref 5.0–8.0)

## 2022-08-28 NOTE — Progress Notes (Signed)
I,[name],acting as a Neurosurgeon for Elizabeth Felts, FNP.,have documented all relevant documentation on the behalf of Elizabeth Felts, FNP,as directed by  Elizabeth Felts, FNP while in the presence of Elizabeth Felts, FNP.  Subjective:    Patient ID: Elizabeth Mcfarland , female    DOB: 06-Feb-1941 , 82 y.o.   MRN: [id number]  Chief Complaint  Patient presents with   Annual Exam    HPI  Patient presents today for a HM, patient reports compliance with medications and has no concerns today. Patient denies any chest pain, SOB, headaches. Patient reports having back pain for over 3 months.     BP Readings from Last 3 Encounters: 08/28/22 : (!) 150/60 06/17/22 : 120/68 05/19/22 : (!) 140/76  Wt Readings from Last 3 Encounters: 08/28/22 : 125 lb 12.8 oz (57.1 kg) 06/19/22 : 126 lb (57.2 kg) 06/17/22 : 126 lb (57.2 kg)      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   Cataract    Chronic kidney disease    Diabetes mellitus without complication (HCC)    Esophageal ulcer with bleeding 05/05/2013   05/05/2013 EGD linear distal esophageal ulcer - cause of hematemesis prior to EGD    GERD (gastroesophageal reflux disease)    History of kidney stones    Hyperlipidemia    Hypertension    Kidney cysts    Personal history of radiation therapy    Sarcoidosis 1970     Family History  Problem Relation Age of Onset   Early death Mother    Stroke Father    Diabetes Sister    Kidney failure Sister    Colon polyps Sister    Stroke Brother    Colon polyps Brother    Cancer Brother 41       prostate cancer   Heart disease Brother    Emphysema Brother    Cancer Brother 71       prostate cancer   Asthma Brother    CAD Maternal Grandmother    Breast cancer Neg Hx    Colon cancer Neg Hx    Esophageal cancer Neg Hx    Rectal cancer Neg Hx    Stomach cancer Neg  Hx      Current Outpatient Medications:    acetaminophen (TYLENOL) 500 MG tablet, Take 1,000 mg by mouth every 6 (six) hours as needed for mild pain or moderate pain., Disp: , Rfl:    anastrozole (ARIMIDEX) 1 MG tablet, TAKE 1 TABLET(1 MG) BY MOUTH DAILY, Disp: 30 tablet, Rfl: 6   Ascorbic Acid (VITAMIN C) 1000 MG tablet, Take 1,000 mg by mouth daily., Disp: , Rfl:    aspirin EC 81 MG tablet, Take 81 mg by mouth daily., Disp: , Rfl:    atorvastatin (LIPITOR) 80 MG tablet, Take 1 tablet (80 mg total) by mouth daily., Disp: 90 tablet, Rfl: 3   cholecalciferol (VITAMIN D) 1000 UNITS tablet, Take 1,000 Units by mouth daily., Disp: , Rfl:    glimepiride (AMARYL) 4 MG tablet, TAKE 1 TABLET BY MOUTH EVERY MORNING, Disp: 90 tablet, Rfl: 1   JANUVIA 100 MG tablet, TAKE 1 TABLET(100 MG) BY MOUTH DAILY, Disp: 30 tablet, Rfl: 1   metoprolol succinate (TOPROL-XL) 25 MG 24 hr tablet, TAKE ONE-HALF OF A TABLET BY MOUTH DAILY, Disp: 45 tablet, Rfl: 3  ONETOUCH ULTRA test strip, USE AS DIRECTED TWICE DAILY, Disp: 100 strip, Rfl: 3   pantoprazole (PROTONIX) 40 MG tablet, TAKE 1 TABLET BY MOUTH DAILY, Disp: 90 tablet, Rfl: 3   Polyethyl Glycol-Propyl Glycol (SYSTANE OP), Place 1 drop into both eyes 2 (two) times daily., Disp: , Rfl:    polyethylene glycol (MIRALAX / GLYCOLAX) packet, Take 17 g by mouth daily. , Disp: , Rfl:    amLODipine (NORVASC) 10 MG tablet, TAKE 1 TABLET(10 MG) BY MOUTH DAILY, Disp: 90 tablet, Rfl: 1   chlorthalidone (HYGROTON) 25 MG tablet, TAKE 1/2 TAB PO QD (Patient not taking: Reported on 06/17/2022), Disp: 30 tablet, Rfl: 1   nitroGLYCERIN (NITROSTAT) 0.4 MG SL tablet, Place 1 tablet (0.4 mg total) under the tongue every 5 (five) minutes as needed for chest pain., Disp: 25 tablet, Rfl: 1   olmesartan (BENICAR) 40 MG tablet, TAKE 1 TABLET(40 MG) BY MOUTH DAILY, Disp: 90 tablet, Rfl: 1   Allergies  Allergen Reactions   Codeine Nausea And Vomiting   Percocet [Oxycodone-Acetaminophen]  Nausea And Vomiting      The patient states she is status post hysterectomy. No LMP recorded. Patient has had a hysterectomy.  Negative for: breast discharge, breast lump(s), breast pain and breast self exam. Associated symptoms include abnormal vaginal bleeding. Pertinent negatives include abnormal bleeding (hematology), anxiety, decreased libido, depression, difficulty falling sleep, dyspareunia, history of infertility, nocturia, sexual dysfunction, sleep disturbances, urinary incontinence, urinary urgency, vaginal discharge and vaginal itching. Diet regular; avoiding sweets and seldomly eating pork. The patient states her exercise level is minimal to moderate with gardening. She is using a cane.   The patient's tobacco use is:  Social History   Tobacco Use  Smoking Status Never  Smokeless Tobacco Never  . She has been exposed to passive smoke. The patient's alcohol use is:  Social History   Substance and Sexual Activity  Alcohol Use No   Review of Systems  Constitutional: Negative.   HENT: Negative.    Eyes: Negative.   Respiratory: Negative.    Cardiovascular: Negative.   Gastrointestinal: Negative.   Endocrine: Negative.   Genitourinary: Negative.   Musculoskeletal:  Positive for back pain (low back pain).  Skin: Negative.   Allergic/Immunologic: Negative.   Neurological: Negative.   Hematological: Negative.   Psychiatric/Behavioral: Negative.       Today's Vitals   08/28/22 0939 08/28/22 1035  BP: (!) 150/60 (!) 150/70  Pulse: 66   Temp: 98.1 F (36.7 C)   TempSrc: Oral   Weight: 125 lb 12.8 oz (57.1 kg)   Height: 5\' 2"  (1.575 m)   PainSc: 8    PainLoc: Back    Body mass index is 23.01 kg/m.  Wt Readings from Last 3 Encounters:  08/28/22 125 lb 12.8 oz (57.1 kg)  06/19/22 126 lb (57.2 kg)  06/17/22 126 lb (57.2 kg)     Objective:  Physical Exam Vitals reviewed.  Constitutional:      General: She is not in acute distress.    Appearance: Normal  appearance. She is well-developed and normal weight.  HENT:     Head: Normocephalic and atraumatic.     Right Ear: Hearing, tympanic membrane, ear canal and external ear normal. There is no impacted cerumen.     Left Ear: Hearing, tympanic membrane, ear canal and external ear normal. There is no impacted cerumen.     Nose:     Comments: Deferred - masked    Mouth/Throat:  Comments: Deferred - masked Eyes:     General: Lids are normal.     Extraocular Movements: Extraocular movements intact.     Conjunctiva/sclera: Conjunctivae normal.     Pupils: Pupils are equal, round, and reactive to light.     Funduscopic exam:    Right eye: No papilledema.        Left eye: No papilledema.  Neck:     Thyroid: No thyroid mass.     Vascular: No carotid bruit.  Cardiovascular:     Rate and Rhythm: Normal rate and regular rhythm.     Pulses: Normal pulses.     Heart sounds: Normal heart sounds. No murmur heard. Pulmonary:     Effort: Pulmonary effort is normal. No respiratory distress.     Breath sounds: Normal breath sounds. No wheezing.  Chest:     Chest wall: No mass.  Breasts:    Tanner Score is 5.     Right: Normal. No mass or tenderness.     Left: Normal. No mass or tenderness.  Abdominal:     General: Abdomen is flat. Bowel sounds are normal. There is no distension.     Palpations: Abdomen is soft.     Tenderness: There is no abdominal tenderness.  Genitourinary:    Rectum: Guaiac result negative.  Musculoskeletal:        General: No swelling or tenderness. Normal range of motion.     Cervical back: Full passive range of motion without pain, normal range of motion and neck supple.     Lumbar back: Negative right straight leg raise test and negative left straight leg raise test.     Right lower leg: No edema.     Left lower leg: No edema.  Lymphadenopathy:     Upper Body:     Right upper body: No supraclavicular, axillary or pectoral adenopathy.     Left upper body: No  supraclavicular, axillary or pectoral adenopathy.  Skin:    General: Skin is warm and dry.     Capillary Refill: Capillary refill takes less than 2 seconds.  Neurological:     General: No focal deficit present.     Mental Status: She is alert and oriented to person, place, and time.     Cranial Nerves: No cranial nerve deficit.     Sensory: No sensory deficit.  Psychiatric:        Mood and Affect: Mood normal.        Behavior: Behavior normal.        Thought Content: Thought content normal.        Judgment: Judgment normal.         Assessment And Plan:     1. Encounter for annual health examination Behavior modifications discussed and diet history reviewed.   Pt will continue to exercise regularly and modify diet with low GI, plant based foods and decrease intake of processed foods.  Recommend intake of daily multivitamin, Vitamin D, and calcium.  Recommend mammogram and colonoscopy for preventive screenings, as well as recommend immunizations that include influenza, TDAP, and Shingles Health Maintenance  Topic Date Due   Complete foot exam   06/13/2022   COVID-19 Vaccine (6 - 2023-24 season) 09/13/2022*   Flu Shot  10/23/2022   Hemoglobin A1C  02/27/2023   Eye exam for diabetics  04/15/2023   Medicare Annual Wellness Visit  06/19/2023   Yearly kidney function blood test for diabetes  08/28/2023   Yearly kidney health urinalysis for diabetes  08/28/2023   DTaP/Tdap/Td vaccine (3 - Td or Tdap) 06/13/2031   Pneumonia Vaccine  Completed   DEXA scan (bone density measurement)  Completed   Zoster (Shingles) Vaccine  Completed   HPV Vaccine  Aged Out   Hepatitis C Screening  Discontinued  *Topic was postponed. The date shown is not the original due date.   2. Essential hypertension Comments: Blood pressure is elevated today improved with second check. She is to continue taking her medications as directed. Limit salt intake. Also having pain today which may be affecting her blood  pressure. EKG done with Sinus Bradycardia nonspecific t abnormality  - EKG 12-Lead - Microalbumin / creatinine urine ratio - POCT Urinalysis Dipstick (81002)  3. Type 2 diabetes mellitus with diabetic peripheral angiopathy without gangrene, without long-term current use of insulin (HCC) Comments: HgbA1c is stable, continue current medications. - CMP14+EGFR - Hemoglobin A1c  4. Mixed hyperlipidemia Comments: Cholesterol levels are stable, continue statin. Tolerating well. - Lipid panel  5. Chronic midline low back pain without sciatica Comments: She is to go for an xray and to use Salonpas patches. Will consider PT if not better. - DG Lumbar Spine Complete; Future  6. Paresthesia of both hands  7. Other long term (current) drug therapy - CBC with Differential/Platelet  8. COVID-19 vaccination declined Declines covid 19 vaccine. Discussed risk of covid 100 and if she changes her mind about the vaccine to call the office. Education has been provided regarding the importance of this vaccine but patient still declined. Advised may receive this vaccine at local pharmacy or Health Dept.or vaccine clinic. Aware to provide a copy of the vaccination record if obtained from local pharmacy or Health Dept.  Encouraged to take multivitamin, vitamin d, vitamin c and zinc to increase immune system. Aware can call office if would like to have vaccine here at office. Verbalized acceptance and understanding.   Return for 1 year physical, 6 month bp check. Patient was given opportunity to ask questions. Patient verbalized understanding of the plan and was able to repeat key elements of the plan. All questions were answered to their satisfaction.   Elizabeth Felts, FNP  I, Elizabeth Felts, FNP, have reviewed all documentation for this visit. The documentation on 08/28/22 for the exam, diagnosis, procedures, and orders are all accurate and complete.

## 2022-08-28 NOTE — Patient Instructions (Signed)

## 2022-08-31 LAB — LIPID PANEL
Chol/HDL Ratio: 1.7 ratio (ref 0.0–4.4)
Cholesterol, Total: 178 mg/dL (ref 100–199)
HDL: 102 mg/dL (ref >39)
LDL Chol Calc (NIH): 67 mg/dL (ref 0–99)
Triglycerides: 44 mg/dL (ref 0–149)
VLDL Cholesterol Cal: 9 mg/dL (ref 5–40)

## 2022-08-31 LAB — CBC WITH DIFFERENTIAL/PLATELET
Basophils Absolute: 0 10*3/uL (ref 0.0–0.2)
Basos: 1 %
EOS (ABSOLUTE): 0.1 10*3/uL (ref 0.0–0.4)
Eos: 1 %
Hematocrit: 38 % (ref 34.0–46.6)
Hemoglobin: 12.1 g/dL (ref 11.1–15.9)
Immature Grans (Abs): 0 10*3/uL (ref 0.0–0.1)
Immature Granulocytes: 0 %
Lymphocytes Absolute: 1 10*3/uL (ref 0.7–3.1)
Lymphs: 16 %
MCH: 29.8 pg (ref 26.6–33.0)
MCHC: 31.8 g/dL (ref 31.5–35.7)
MCV: 94 fL (ref 79–97)
Monocytes Absolute: 0.4 10*3/uL (ref 0.1–0.9)
Monocytes: 7 %
Neutrophils Absolute: 4.5 10*3/uL (ref 1.4–7.0)
Neutrophils: 75 %
Platelets: 238 10*3/uL (ref 150–450)
RBC: 4.06 x10E6/uL (ref 3.77–5.28)
RDW: 11.4 % — ABNORMAL LOW (ref 11.7–15.4)
WBC: 5.9 10*3/uL (ref 3.4–10.8)

## 2022-08-31 LAB — CMP14+EGFR
ALT: 27 IU/L (ref 0–32)
AST: 29 IU/L (ref 0–40)
Albumin/Globulin Ratio: 2.1 (ref 1.2–2.2)
Albumin: 4.5 g/dL (ref 3.8–4.8)
Alkaline Phosphatase: 82 IU/L (ref 44–121)
BUN/Creatinine Ratio: 14 (ref 12–28)
BUN: 12 mg/dL (ref 8–27)
Bilirubin Total: 0.6 mg/dL (ref 0.0–1.2)
CO2: 27 mmol/L (ref 20–29)
Calcium: 9.6 mg/dL (ref 8.7–10.3)
Chloride: 103 mmol/L (ref 96–106)
Creatinine, Ser: 0.87 mg/dL (ref 0.57–1.00)
Globulin, Total: 2.1 g/dL (ref 1.5–4.5)
Glucose: 136 mg/dL — ABNORMAL HIGH (ref 70–99)
Potassium: 4.1 mmol/L (ref 3.5–5.2)
Sodium: 140 mmol/L (ref 134–144)
Total Protein: 6.6 g/dL (ref 6.0–8.5)
eGFR: 67 mL/min/{1.73_m2} (ref >59)

## 2022-08-31 LAB — MICROALBUMIN / CREATININE URINE RATIO
Creatinine, Urine: 47.2 mg/dL
Microalb/Creat Ratio: 16 mg/g creat (ref 0–29)
Microalbumin, Urine: 7.6 ug/mL

## 2022-08-31 LAB — HEMOGLOBIN A1C
Est. average glucose Bld gHb Est-mCnc: 140 mg/dL
Hgb A1c MFr Bld: 6.5 % — ABNORMAL HIGH (ref 4.8–5.6)

## 2022-09-03 ENCOUNTER — Other Ambulatory Visit: Payer: Self-pay | Admitting: Nurse Practitioner

## 2022-09-03 DIAGNOSIS — I10 Essential (primary) hypertension: Secondary | ICD-10-CM

## 2022-09-07 DIAGNOSIS — R202 Paresthesia of skin: Secondary | ICD-10-CM | POA: Insufficient documentation

## 2022-09-07 DIAGNOSIS — E1151 Type 2 diabetes mellitus with diabetic peripheral angiopathy without gangrene: Secondary | ICD-10-CM | POA: Insufficient documentation

## 2022-09-07 DIAGNOSIS — G8929 Other chronic pain: Secondary | ICD-10-CM | POA: Insufficient documentation

## 2022-09-07 DIAGNOSIS — E782 Mixed hyperlipidemia: Secondary | ICD-10-CM | POA: Insufficient documentation

## 2022-09-21 DIAGNOSIS — I10 Essential (primary) hypertension: Secondary | ICD-10-CM | POA: Diagnosis not present

## 2022-09-21 DIAGNOSIS — E1142 Type 2 diabetes mellitus with diabetic polyneuropathy: Secondary | ICD-10-CM | POA: Diagnosis not present

## 2022-09-23 ENCOUNTER — Ambulatory Visit: Payer: Medicare Other

## 2022-09-23 VITALS — BP 130/60 | HR 57 | Temp 98.2°F | Ht 62.0 in | Wt 125.0 lb

## 2022-09-23 DIAGNOSIS — I10 Essential (primary) hypertension: Secondary | ICD-10-CM

## 2022-09-23 NOTE — Patient Instructions (Signed)
Hypertension, Adult Hypertension is another name for high blood pressure. High blood pressure forces your heart to work harder to pump blood. This can cause problems over time. There are two numbers in a blood pressure reading. There is a top number (systolic) over a bottom number (diastolic). It is best to have a blood pressure that is below 120/80. What are the causes? The cause of this condition is not known. Some other conditions can lead to high blood pressure. What increases the risk? Some lifestyle factors can make you more likely to develop high blood pressure: Smoking. Not getting enough exercise or physical activity. Being overweight. Having too much fat, sugar, calories, or salt (sodium) in your diet. Drinking too much alcohol. Other risk factors include: Having any of these conditions: Heart disease. Diabetes. High cholesterol. Kidney disease. Obstructive sleep apnea. Having a family history of high blood pressure and high cholesterol. Age. The risk increases with age. Stress. What are the signs or symptoms? High blood pressure may not cause symptoms. Very high blood pressure (hypertensive crisis) may cause: Headache. Fast or uneven heartbeats (palpitations). Shortness of breath. Nosebleed. Vomiting or feeling like you may vomit (nauseous). Changes in how you see. Very bad chest pain. Feeling dizzy. Seizures. How is this treated? This condition is treated by making healthy lifestyle changes, such as: Eating healthy foods. Exercising more. Drinking less alcohol. Your doctor may prescribe medicine if lifestyle changes do not help enough and if: Your top number is above 130. Your bottom number is above 80. Your personal target blood pressure may vary. Follow these instructions at home: Eating and drinking  If told, follow the DASH eating plan. To follow this plan: Fill one half of your plate at each meal with fruits and vegetables. Fill one fourth of your plate  at each meal with whole grains. Whole grains include whole-wheat pasta, brown rice, and whole-grain bread. Eat or drink low-fat dairy products, such as skim milk or low-fat yogurt. Fill one fourth of your plate at each meal with low-fat (lean) proteins. Low-fat proteins include fish, chicken without skin, eggs, beans, and tofu. Avoid fatty meat, cured and processed meat, or chicken with skin. Avoid pre-made or processed food. Limit the amount of salt in your diet to less than 1,500 mg each day. Do not drink alcohol if: Your doctor tells you not to drink. You are pregnant, may be pregnant, or are planning to become pregnant. If you drink alcohol: Limit how much you have to: 0-1 drink a day for women. 0-2 drinks a day for men. Know how much alcohol is in your drink. In the [place] one drink equals one 12 oz bottle of beer (355 mL), one 5 oz glass of wine (148 mL), or one 1 oz glass of hard liquor (44 mL). Lifestyle  Work with your doctor to stay at a healthy weight or to lose weight. Ask your doctor what the best weight is for you. Get at least 30 minutes of exercise that causes your heart to beat faster (aerobic exercise) most days of the week. This may include walking, swimming, or biking. Get at least 30 minutes of exercise that strengthens your muscles (resistance exercise) at least 3 days a week. This may include lifting weights or doing Pilates. Do not smoke or use any products that contain nicotine or tobacco. If you need help quitting, ask your doctor. Check your blood pressure at home as told by your doctor. Keep all follow-up visits. Medicines Take over-the-counter and prescription medicines   only as told by your doctor. Follow directions carefully. Do not skip doses of blood pressure medicine. The medicine does not work as well if you skip doses. Skipping doses also puts you at risk for problems. Ask your doctor about side effects or reactions to medicines that you should watch  for. Contact a doctor if: You think you are having a reaction to the medicine you are taking. You have headaches that keep coming back. You feel dizzy. You have swelling in your ankles. You have trouble with your vision. Get help right away if: You get a very bad headache. You start to feel mixed up (confused). You feel weak or numb. You feel faint. You have very bad pain in your: Chest. Belly (abdomen). You vomit more than once. You have trouble breathing. These symptoms may be an emergency. Get help right away. Call 911. Do not wait to see if the symptoms will go away. Do not drive yourself to the hospital. Summary Hypertension is another name for high blood pressure. High blood pressure forces your heart to work harder to pump blood. For most people, a normal blood pressure is less than 120/80. Making healthy choices can help lower blood pressure. If your blood pressure does not get lower with healthy choices, you may need to take medicine. This information is not intended to replace advice given to you by your health care provider. Make sure you discuss any questions you have with your health care provider. Document Revised: 12/27/2020 Document Reviewed: 12/27/2020 Elsevier Patient Education  2024 [employer]  

## 2022-09-23 NOTE — Progress Notes (Signed)
Patient presents today for a bp check. Patient is taking olmesartan 40mg  in the morning and amlodipine 10mg  in the evenings. Patient's bp today is 150/60 p59 and I waited 10 minutes and rechecked and it was 130/60 p57.  Per provider: Molli Knock that is better, make sure she is eating a low salt diet and staying well hydrated with water and keep next appt as scheduled   BP Readings from Last 3 Encounters:  08/28/22 (!) 150/70  06/17/22 120/68  05/19/22 (!) 140/76     Repeat 130/60 p57

## 2022-09-27 DIAGNOSIS — I10 Essential (primary) hypertension: Secondary | ICD-10-CM | POA: Diagnosis not present

## 2022-09-27 DIAGNOSIS — E1142 Type 2 diabetes mellitus with diabetic polyneuropathy: Secondary | ICD-10-CM | POA: Diagnosis not present

## 2022-10-07 ENCOUNTER — Other Ambulatory Visit: Payer: Self-pay

## 2022-10-07 DIAGNOSIS — I700 Atherosclerosis of aorta: Secondary | ICD-10-CM

## 2022-10-07 MED ORDER — ATORVASTATIN CALCIUM 80 MG PO TABS
80.0000 mg | ORAL_TABLET | Freq: Every day | ORAL | 3 refills | Status: DC
Start: 2022-10-07 — End: 2023-07-01

## 2022-10-13 ENCOUNTER — Other Ambulatory Visit: Payer: Self-pay | Admitting: Internal Medicine

## 2022-10-13 ENCOUNTER — Ambulatory Visit
Admission: RE | Admit: 2022-10-13 | Discharge: 2022-10-13 | Disposition: A | Discharge status: Home / Self Care | Payer: Medicare Other | Source: Ambulatory Visit | Attending: Internal Medicine | Admitting: Internal Medicine

## 2022-10-13 DIAGNOSIS — M85839 Other specified disorders of bone density and structure, unspecified forearm: Secondary | ICD-10-CM

## 2022-10-13 DIAGNOSIS — Z853 Personal history of malignant neoplasm of breast: Secondary | ICD-10-CM | POA: Diagnosis not present

## 2022-10-13 DIAGNOSIS — M8588 Other specified disorders of bone density and structure, other site: Secondary | ICD-10-CM | POA: Diagnosis not present

## 2022-10-13 DIAGNOSIS — E349 Endocrine disorder, unspecified: Secondary | ICD-10-CM | POA: Diagnosis not present

## 2022-10-22 ENCOUNTER — Other Ambulatory Visit: Payer: Self-pay

## 2022-10-22 DIAGNOSIS — E1142 Type 2 diabetes mellitus with diabetic polyneuropathy: Secondary | ICD-10-CM | POA: Diagnosis not present

## 2022-10-22 DIAGNOSIS — I10 Essential (primary) hypertension: Secondary | ICD-10-CM | POA: Diagnosis not present

## 2022-10-22 DIAGNOSIS — Z170 Estrogen receptor positive status [ER+]: Secondary | ICD-10-CM

## 2022-10-22 DIAGNOSIS — M8589 Other specified disorders of bone density and structure, multiple sites: Secondary | ICD-10-CM | POA: Insufficient documentation

## 2022-10-22 NOTE — Assessment & Plan Note (Signed)
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

## 2022-10-22 NOTE — Assessment & Plan Note (Signed)
01/09/2022 - screening mammogram negative  10/23/2022 - screening mammogram ordered for 12/2022

## 2022-10-22 NOTE — Progress Notes (Unsigned)
Patient Care Team: Dorothyann Peng, MD as PCP - General (Internal Medicine) [name], Rachelle Hora, MD as PCP - Cardiology (Cardiology) Pershing Proud, RN as Oncology Nurse Navigator Donnelly Angelica, RN as Oncology Nurse Navigator Malachy Mood, MD as Consulting Physician (Hematology) Griselda Miner, MD as Consulting Physician (General Surgery) Antony Blackbird, MD as Consulting Physician (Radiation Oncology) Pollyann Samples, NP as Nurse Practitioner (Nurse Practitioner) Harlan Stains, RPH (Inactive) (Pharmacist) [place], Khristie Sak, OD (Optometry)  Clinic Day:  10/23/2022  Referring physician: Dorothyann Peng, MD  ASSESSMENT & PLAN:   Assessment & Plan: Osteopenia of multiple sites BMD 10/13/2022 - osteopenia of left radius and left femur -risk of osteoporotic fracture in next 10 years is 4.8% -risk of hip fracture in next 10 years is 1% -ensure proper calcium and vitamin d and low-impact physical activity -repeat BMD in 2 years   Malignant neoplasm of upper-inner quadrant of right breast in female, estrogen receptor positive (HCC) 01/09/2022 - screening mammogram negative  10/23/2022 - screening mammogram ordered for 12/2022    The patient understands the plans discussed today and is in agreement with them.  She knows to contact our office if she develops concerns prior to her next appointment.  I provided 30 minutes of face-to-face time during this encounter and > 50% was spent counseling as documented under my assessment and plan.    Carlean Jews, NP  [facility] [address] [place] [address] Dept: [phone] Dept Fax: [phone]   Orders Placed This Encounter  Procedures   MM 3D SCREENING MAMMOGRAM BILATERAL BREAST    Standing Status:   Future    Standing Expiration Date:   10/23/2023    Order Specific Question:   Reason for Exam (SYMPTOM  OR DIAGNOSIS REQUIRED)    Answer:   screening for breast  cancer. history of breast cancer    Order Specific Question:   Preferred imaging location?    Answer:   [facility]   CBC with Differential (Cancer Center Only)    Standing Status:   Future    Standing Expiration Date:   10/23/2023   CMP (Cancer Center only)    Standing Status:   Future    Standing Expiration Date:   10/23/2023      CHIEF COMPLAINT:  CC: right breast cancer - ER+, PR+, HER2-  Current Treatment:  anastrozole 1 mg daily - started 10/24/2018    INTERVAL HISTORY:  Elizabeth Mcfarland is here today for repeat clinical assessment. She denies fevers or chills. She denies pain. Her appetite is good. Her weight has been stable.  PLAN: Labs normal and results reviewed with the patient today.  Continue anastrozole 1 mg daily  Repeat screening mammogram 12/2022 Ensure proper calcium and vitamin D intake. Continue to be physically active. Repeat bone density 09/2024 Labs and follow up in 6 months   I have reviewed the past medical history, past surgical history, social history and family history with the patient and they are unchanged from previous note.  ALLERGIES:  is allergic to codeine and percocet [oxycodone-acetaminophen].  MEDICATIONS:  Current Outpatient Medications  Medication Sig Dispense Refill   acetaminophen (TYLENOL) 500 MG tablet Take 1,000 mg by mouth every 6 (six) hours as needed for mild pain or moderate pain.     amLODipine (NORVASC) 10 MG tablet TAKE 1 TABLET(10 MG) BY MOUTH DAILY 90 tablet 1   anastrozole (ARIMIDEX) 1 MG tablet TAKE 1 TABLET(1  MG) BY MOUTH DAILY 30 tablet 6   Ascorbic Acid (VITAMIN C) 1000 MG tablet Take 1,000 mg by mouth daily.     aspirin EC 81 MG tablet Take 81 mg by mouth daily.     atorvastatin (LIPITOR) 80 MG tablet Take 1 tablet (80 mg total) by mouth daily. 90 tablet 3   cholecalciferol (VITAMIN D) 1000 UNITS tablet Take 1,000 Units by mouth daily.     glimepiride (AMARYL) 4 MG tablet TAKE 1 TABLET BY MOUTH EVERY MORNING 90 tablet 1    JANUVIA 100 MG tablet TAKE 1 TABLET(100 MG) BY MOUTH DAILY 30 tablet 1   Multiple Vitamins-Minerals (CENTRUM SILVER 50+WOMEN PO) Take 1 tablet by mouth daily.     nitroGLYCERIN (NITROSTAT) 0.4 MG SL tablet Place 1 tablet (0.4 mg total) under the tongue every 5 (five) minutes as needed for chest pain. 25 tablet 1   olmesartan (BENICAR) 40 MG tablet TAKE 1 TABLET(40 MG) BY MOUTH DAILY 90 tablet 1   Polyethyl Glycol-Propyl Glycol (SYSTANE OP) Place 1 drop into both eyes 2 (two) times daily.     polyethylene glycol (MIRALAX / GLYCOLAX) packet Take 17 g by mouth daily.      chlorthalidone (HYGROTON) 25 MG tablet TAKE 1/2 TAB PO QD (Patient not taking: Reported on 06/17/2022) 30 tablet 1   metoprolol succinate (TOPROL-XL) 25 MG 24 hr tablet TAKE ONE-HALF OF A TABLET BY MOUTH DAILY (Patient not taking: Reported on 09/23/2022) 45 tablet 3   ONETOUCH ULTRA test strip USE AS DIRECTED TWICE DAILY 100 strip 3   pantoprazole (PROTONIX) 40 MG tablet TAKE 1 TABLET BY MOUTH DAILY (Patient not taking: Reported on 10/23/2022) 90 tablet 3   No current facility-administered medications for this visit.    HISTORY OF PRESENT ILLNESS:   Oncology History Overview Note  Cancer Staging Malignant neoplasm of upper-inner quadrant of right breast in female, estrogen receptor positive (HCC) Staging form: Breast, AJCC 8th Edition - Clinical stage from 10/07/2018: cT2, cN0, cM0, GX, ER+, PR+, HER2: Equivocal - Signed by Malachy Mood, MD on 10/12/2018 - Pathologic stage from 12/25/2018: Stage IA (pT2, pN0, cM0, G2, ER+, PR+, HER2-, Oncotype DX score: 21) - Signed by Malachy Mood, MD on 02/01/2019    Malignant neoplasm of upper-inner quadrant of right breast in female, estrogen receptor positive (HCC)  10/01/2018 Imaging   Bone Density Scan 10/01/18  IMPRESSION Lowest T-score of -0.9 at left femur neck (NORMAL) T-score DualFemur Neck Left  10/01/2018    76.1         -0.9    0.916 g/cm2   AP Spine  L1-L4      10/01/2018    76.1          1.2     1.326 g/cm2   DualFemur Total Mean 10/01/2018    76.1         0.1     1.024 g/cm2   10/05/2018 Mammogram   Diagnostic Mammogram 10/05/18 IMPRESSION: 1. Highly suspicious right breast mass 1 o'clock position 2 cm from the nipple on the right. It measures 2.1 x 1.7 x 1.5 cm. Corresponding with the screening mammographic findings. Recommendation is for ultrasound-guided biopsy. 2. Indeterminate left breast mass at the 10 o'clock position 9 cm from the nipple. It measures 1.3 x 0.5 x 0.3 cm. This may represent a benign etiology such as a degenerating fibroadenoma. However, ultrasound-guided biopsy for definitive tissue diagnosis is recommended. 3. No suspicious lymphadenopathy bilaterally.   10/07/2018 Cancer Staging  Staging form: Breast, AJCC 8th Edition - Clinical stage from 10/07/2018: cT2, cN0, cM0, GX, ER+, PR+, HER2: Equivocal - Signed by Malachy Mood, MD on 10/12/2018   10/07/2018 Initial Biopsy   Diagnosis 10/07/18 1. Breast, right, needle core biopsy, 1 o'clock, 2cmfn - INVASIVE MAMMARY CARCINOMA WITH CALCIFICATIONS. SEE NOTE 2. Breast, left, needle core biopsy, 10 o'clock - FIBROADENOMA WITH DYSTROPHIC CALCIFICATIONS - NEGATIVE FOR CARCINOMA   10/07/2018 Receptors her2   By immunohistochemistry, the tumor cells are EQUIVOCAL for Her2 (2+). HER2 by FISH will be PERFORMED and the RESULTS REPORTED SEPARATELY Estrogen Receptor: 100%, POSITIVE, STRONG STAINING INTENSITY Progesterone Receptor: 70%, POSITIVE, STRONG STAINING INTENSITY Proliferation Marker Ki67: 10%   10/11/2018 Initial Diagnosis   Malignant neoplasm of upper-inner quadrant of right breast in female, estrogen receptor positive (HCC)   10/2018 -  Neo-Adjuvant Anti-estrogen oral therapy   She started anastrozole end of 10/2018 before surgery.   11/25/2018 Surgery   BILATERAL BREAST LUMPECTOMIES  WITH BILATERAL  RADIOACTIVE SEEDS AND RIGHT SENTINEL LYMPH NODE BIOPSY by Dr Carolynne Edouard  11/24/28   11/25/2018 Pathology  Results   Diagnosis 11/25/18 1. Breast, lumpectomy, Left w/seed - FIBROADENOMA WITH CALCIFICATIONS. - BIOPSY SITE CHANGES. 2. Lymph node, sentinel, biopsy, Right Axillary #1 - ONE LYMPH NODE, NEGATIVE FOR CARCINOMA (0/1). 3. Lymph node, sentinel, biopsy, Right Axillary #2 - ONE LYMPH NODE, NEGATIVE FOR CARCINOMA (0/1). 4. Lymph node, sentinel, biopsy, Right - ONE LYMPH NODE, NEGATIVE FOR CARCINOMA (0/1). 5. Breast, lumpectomy, Right w/seed - INVASIVE DUCTAL CARCINOMA, 2.6 CM, NOTTINGHAM GRADE 2 OF 3. - MARGINS OF RESECTION ARE NOT INVOLVED (CLOSEST MARGIN: LESS THAN 1 MM, SUPERIOR). - BIOPSY SITE CHANGES. - SEE ONCOLOGY TABLE. 6. Medical device, removal, radioactive seed - RADIOACTIVE SEED. - GROSS ONLY.   11/25/2018 Oncotype testing   Oncotype 11/25/18 Recurrence Score 21 with distant recurrence risk at 9 years of with Tamoxifen alone 7%.  There is less than 1% benefit from chemo.    12/25/2018 Cancer Staging   Staging form: Breast, AJCC 8th Edition - Pathologic stage from 12/25/2018: Stage IA (pT2, pN0, cM0, G2, ER+, PR+, HER2-, Oncotype DX score: 21) - Signed by Malachy Mood, MD on 02/01/2019   01/11/2019 - 02/08/2019 Radiation Therapy   Adjuvant Radiation with Dr Roselind Messier 01/11/19-02/08/19   05/05/2019 Survivorship   SCP delivered by Santiago Glad, NP        REVIEW OF SYSTEMS:   Constitutional: Denies fevers, chills or abnormal weight loss. Reports some new fatigue  Eyes: Denies blurriness of vision Ears, nose, mouth, throat, and face: Denies mucositis or sore throat Respiratory: Denies cough, dyspnea or wheezes Cardiovascular: Denies palpitation, chest discomfort or lower extremity swelling Gastrointestinal:  Denies nausea, heartburn or change in bowel habits Skin: Denies abnormal skin rashes Lymphatics: Denies new lymphadenopathy or easy bruising Neurological:Denies numbness, tingling or new weaknesses Behavioral/Psych: Mood is stable, no new changes  All other systems were  reviewed with the patient and are negative.   VITALS:   Today's Vitals   10/23/22 0918 10/23/22 0935  BP: (!) 148/71   Pulse: 66   Resp: 13   Temp: 98.2 F (36.8 C)   TempSrc: Oral   SpO2: 100%   Weight: 125 lb (56.7 kg)   PainSc:  0-No pain   Body mass index is 22.86 kg/m.   Wt Readings from Last 3 Encounters:  10/23/22 125 lb (56.7 kg)  09/23/22 125 lb (56.7 kg)  08/28/22 125 lb 12.8 oz (57.1 kg)    Body mass index  is 22.86 kg/m.  Performance status (ECOG): 1 - Symptomatic but completely ambulatory  PHYSICAL EXAM:   Physical Exam Vitals and nursing note reviewed.  Constitutional:      Appearance: Normal appearance. She is well-developed.  HENT:     Head: Normocephalic and atraumatic.     Nose: Nose normal.     Mouth/Throat:     Mouth: Mucous membranes are moist.     Pharynx: Oropharynx is clear.  Eyes:     Extraocular Movements: Extraocular movements intact.     Conjunctiva/sclera: Conjunctivae normal.     Pupils: Pupils are equal, round, and reactive to light.  Neck:     Vascular: No carotid bruit.  Cardiovascular:     Rate and Rhythm: Normal rate and regular rhythm.     Pulses: Normal pulses.     Heart sounds: Normal heart sounds.  Pulmonary:     Effort: Pulmonary effort is normal.     Breath sounds: Normal breath sounds.  Chest:       Comments: Surgical scarring over left nipple area. Scar tissue noted during breast exam. No mass or tenderness noted at this time.  Abdominal:     Palpations: Abdomen is soft.  Musculoskeletal:        General: Normal range of motion.     Cervical back: Normal range of motion and neck supple.  Lymphadenopathy:     Cervical: No cervical adenopathy.     Upper Body:     Right upper body: No supraclavicular, axillary or pectoral adenopathy.     Left upper body: No supraclavicular, axillary or pectoral adenopathy.  Skin:    General: Skin is warm and dry.     Capillary Refill: Capillary refill takes less than 2  seconds.  Neurological:     General: No focal deficit present.     Mental Status: She is alert and oriented to person, place, and time.  Psychiatric:        Mood and Affect: Mood normal.        Behavior: Behavior normal.        Thought Content: Thought content normal.        Judgment: Judgment normal.      LABORATORY DATA:  I have reviewed the data as listed    Component Value Date/Time   NA 141 10/23/2022 0844   NA 140 08/28/2022 1051   K 4.1 10/23/2022 0844   CL 105 10/23/2022 0844   CO2 31 10/23/2022 0844   GLUCOSE 92 10/23/2022 0844   BUN 12 10/23/2022 0844   BUN 12 08/28/2022 1051   CREATININE 0.85 10/23/2022 0844   CALCIUM 9.4 10/23/2022 0844   PROT 7.0 10/23/2022 0844   PROT 6.6 08/28/2022 1051   ALBUMIN 4.3 10/23/2022 0844   ALBUMIN 4.5 08/28/2022 1051   AST 27 10/23/2022 0844   ALT 23 10/23/2022 0844   ALKPHOS 68 10/23/2022 0844   BILITOT 0.8 10/23/2022 0844   GFRNONAA >60 10/23/2022 0844   GFRAA 64 12/20/2019 1605   GFRAA >60 08/01/2019 0845      Lab Results  Component Value Date   WBC 5.9 10/23/2022   NEUTROABS 4.3 10/23/2022   HGB 12.6 10/23/2022   HCT 37.5 10/23/2022   MCV 91.2 10/23/2022   PLT 196 10/23/2022      Chemistry      Component Value Date/Time   NA 141 10/23/2022 0844   NA 140 08/28/2022 1051   K 4.1 10/23/2022 0844   CL 105 10/23/2022 0844  CO2 31 10/23/2022 0844   BUN 12 10/23/2022 0844   BUN 12 08/28/2022 1051   CREATININE 0.85 10/23/2022 0844      Component Value Date/Time   CALCIUM 9.4 10/23/2022 0844   ALKPHOS 68 10/23/2022 0844   AST 27 10/23/2022 0844   ALT 23 10/23/2022 0844   BILITOT 0.8 10/23/2022 0844       RADIOGRAPHIC STUDIES: I have personally reviewed the radiological images as listed and agreed with the findings in the report. DG Bone Density  Result Date: 10/13/2022 EXAM: DUAL X-RAY ABSORPTIOMETRY (DXA) FOR BONE MINERAL DENSITY IMPRESSION: Referring Physician:  Dorothyann Peng Your patient completed  a bone mineral density test using GE Lunar iDXA system (analysis version: 16). Technologist:    lmn PATIENT: Name: Elizabeth Mcfarland, Elizabeth Mcfarland Patient ID: [id number] Birth Date: 09-Feb-1941 Height: 63.0 in. Sex: Female Measured: 10/13/2022 Weight: 124.2 lbs. Indications: Advanced Age, Anastrazole, Breast Cancer History, Diabetic non insulin, Estrogen Deficient, Hysterectomy, Pantoprazole, Postmenopausal, Secondary Osteoporosis Fractures: None Treatments: Vitamin D (E933.5) ASSESSMENT: The BMD measured at Forearm Radius 33% is 0.693 g/cm2 with a T-score of -2.1. This patient is considered osteopenic/low bone mass according to World Health Organization [facility]) criteria. The quality of the exam is good. The lumbar spine was excluded due to degenerative changes. Site Region Measured Date Measured Age YA BMD Significant CHANGE T-score Left Forearm Radius 33% 10/13/2022 80.1 -4.1 0.693 g/cm2 Left Forearm Radius 33% 10/10/2020 78.1 [age]1.10 0.728 g/cm2 DualFemur Neck Left 10/13/2022 80.1 -1.0 0.896 g/cm2 * DualFemur Neck Left 10/10/2020 78.1 -0.7 0.945 g/cm2 DualFemur Total Mean 10/13/2022 80.1 -0.1 1.000 g/cm2 * DualFemur Total Mean 10/10/2020 78.1 0.4 1.058 g/cm2 * World Health Organization [facility]) criteria for post-menopausal, Caucasian Women: Normal       T-score at or above -1 SD Osteopenia   T-score between -1 and -2.5 SD Osteoporosis T-score at or below -2.5 SD RECOMMENDATION: 1. All patients should optimize calcium and vitamin D intake. 2. Consider FDA-approved medical therapies in postmenopausal women and men aged 47 years and older, based on the following: a. A hip or vertebral (clinical or morphometric) fracture. b. T-score = -2.5 at the femoral neck or spine after appropriate evaluation to exclude secondary causes. c. Low bone mass (T-score between -1.0 and -2.5 at the femoral neck or spine) and a 10-year probability of a hip fracture = 3% or a 10-year probability of a major osteoporosis-related fracture = 20% based on the [place]-adapted  WHO algorithm. d. Clinician judgment and/or patient preferences may indicate treatment for people with 10-year fracture probabilities above or below these levels. FOLLOW-UP: Patients with diagnosis of osteoporosis or at high risk for fracture should have regular bone mineral density tests. Patients eligible for Medicare are allowed routine testing every 2 years. The testing frequency can be increased to one year for patients who have rapidly progressing disease, are receiving or discontinuing medical therapy to restore bone mass, or have additional risk factors. I have reviewed this study and agree with the findings. [facility] FRAX* 10-year Probability of Fracture Based on femoral neck BMD: DualFemur (Left) Major Osteoporotic Fracture: 4.8% Hip Fracture:                1.0% Population:                  [place] (Black) Risk Factors:                Secondary Osteoporosis *FRAX is a Armed forces logistics/support/administrative officer of the [employer] for  Metabolic Bone Disease, a World Science writer (WHO) Collaborating Centre. ASSESSMENT: The probability of a major osteoporotic fracture is 4.8% within the next ten years. The probability of a hip fracture is 1.0% within the next ten years. Electronically Signed   By: Frederico Hamman M.D.   On: 10/13/2022 11:28    PLAN: Continue anastrozole 1 mg daily  Repeat screening mammogram 12/2022 Ensure proper calcium and vitamin D intake. Continue to be physically active. Repeat bone density 09/2024 Labs and follow up in 6 months   Addendum I have seen the patient, examined her. I agree with the assessment and and plan and have edited the notes.   Ms Metten is doing well overall, tolerating anastrozole well, will continue.  We reviewed her recent bone density scan results, which showed osteopenia, no high risk for fracture.  We encouraged her to take calcium and vitamin D, and weightbearing exercise.  Will see her back in 6 months.  Continue cancer  surveillance.  Malachy Mood MD  10/23/2022

## 2022-10-23 ENCOUNTER — Inpatient Hospital Stay: Payer: Medicare Other | Attending: Nurse Practitioner

## 2022-10-23 ENCOUNTER — Other Ambulatory Visit: Payer: Self-pay

## 2022-10-23 ENCOUNTER — Inpatient Hospital Stay: Payer: Medicare Other | Admitting: Nurse Practitioner

## 2022-10-23 VITALS — BP 148/71 | HR 66 | Temp 98.2°F | Resp 13 | Wt 125.0 lb

## 2022-10-23 DIAGNOSIS — Z170 Estrogen receptor positive status [ER+]: Secondary | ICD-10-CM | POA: Diagnosis not present

## 2022-10-23 DIAGNOSIS — Z79811 Long term (current) use of aromatase inhibitors: Secondary | ICD-10-CM | POA: Insufficient documentation

## 2022-10-23 DIAGNOSIS — C50211 Malignant neoplasm of upper-inner quadrant of right female breast: Secondary | ICD-10-CM | POA: Diagnosis not present

## 2022-10-23 DIAGNOSIS — M8589 Other specified disorders of bone density and structure, multiple sites: Secondary | ICD-10-CM | POA: Insufficient documentation

## 2022-10-23 DIAGNOSIS — Z1231 Encounter for screening mammogram for malignant neoplasm of breast: Secondary | ICD-10-CM | POA: Diagnosis not present

## 2022-10-23 LAB — CMP (CANCER CENTER ONLY)
ALT: 23 U/L (ref 0–44)
AST: 27 U/L (ref 15–41)
Albumin: 4.3 g/dL (ref 3.5–5.0)
Alkaline Phosphatase: 68 U/L (ref 38–126)
Anion gap: 5 (ref 5–15)
BUN: 12 mg/dL (ref 8–23)
CO2: 31 mmol/L (ref 22–32)
Calcium: 9.4 mg/dL (ref 8.9–10.3)
Chloride: 105 mmol/L (ref 98–111)
Creatinine: 0.85 mg/dL (ref 0.44–1.00)
GFR, Estimated: >60 mL/min (ref >60)
Glucose, Bld: 92 mg/dL (ref 70–99)
Potassium: 4.1 mmol/L (ref 3.5–5.1)
Sodium: 141 mmol/L (ref 135–145)
Total Bilirubin: 0.8 mg/dL (ref 0.3–1.2)
Total Protein: 7 g/dL (ref 6.5–8.1)

## 2022-10-23 LAB — CBC WITH DIFFERENTIAL (CANCER CENTER ONLY)
Abs Immature Granulocytes: 0.02 10*3/uL (ref 0.00–0.07)
Basophils Absolute: 0 10*3/uL (ref 0.0–0.1)
Basophils Relative: 1 %
Eosinophils Absolute: 0 10*3/uL (ref 0.0–0.5)
Eosinophils Relative: 1 %
HCT: 37.5 % (ref 36.0–46.0)
Hemoglobin: 12.6 g/dL (ref 12.0–15.0)
Immature Granulocytes: 0 %
Lymphocytes Relative: 18 %
Lymphs Abs: 1.1 10*3/uL (ref 0.7–4.0)
MCH: 30.7 pg (ref 26.0–34.0)
MCHC: 33.6 g/dL (ref 30.0–36.0)
MCV: 91.2 fL (ref 80.0–100.0)
Monocytes Absolute: 0.4 10*3/uL (ref 0.1–1.0)
Monocytes Relative: 7 %
Neutro Abs: 4.3 10*3/uL (ref 1.7–7.7)
Neutrophils Relative %: 73 %
Platelet Count: 196 10*3/uL (ref 150–400)
RBC: 4.11 MIL/uL (ref 3.87–5.11)
RDW: 11.9 % (ref 11.5–15.5)
WBC Count: 5.9 10*3/uL (ref 4.0–10.5)
nRBC: 0 % (ref 0.0–0.2)

## 2022-10-27 DIAGNOSIS — I10 Essential (primary) hypertension: Secondary | ICD-10-CM | POA: Diagnosis not present

## 2022-10-27 DIAGNOSIS — E1142 Type 2 diabetes mellitus with diabetic polyneuropathy: Secondary | ICD-10-CM | POA: Diagnosis not present

## 2022-10-30 ENCOUNTER — Encounter: Payer: Self-pay | Admitting: Internal Medicine

## 2022-10-30 ENCOUNTER — Ambulatory Visit (INDEPENDENT_AMBULATORY_CARE_PROVIDER_SITE_OTHER): Payer: Medicare Other | Admitting: Internal Medicine

## 2022-10-30 VITALS — BP 120/70 | HR 64 | Temp 98.5°F | Ht 62.0 in | Wt 124.6 lb

## 2022-10-30 DIAGNOSIS — M545 Low back pain, unspecified: Secondary | ICD-10-CM | POA: Diagnosis not present

## 2022-10-30 DIAGNOSIS — E1151 Type 2 diabetes mellitus with diabetic peripheral angiopathy without gangrene: Secondary | ICD-10-CM

## 2022-10-30 DIAGNOSIS — M79609 Pain in unspecified limb: Secondary | ICD-10-CM | POA: Diagnosis not present

## 2022-10-30 DIAGNOSIS — I25119 Atherosclerotic heart disease of native coronary artery with unspecified angina pectoris: Secondary | ICD-10-CM

## 2022-10-30 DIAGNOSIS — I10 Essential (primary) hypertension: Secondary | ICD-10-CM

## 2022-10-30 DIAGNOSIS — G8929 Other chronic pain: Secondary | ICD-10-CM

## 2022-10-30 DIAGNOSIS — E782 Mixed hyperlipidemia: Secondary | ICD-10-CM

## 2022-10-30 DIAGNOSIS — I119 Hypertensive heart disease without heart failure: Secondary | ICD-10-CM | POA: Diagnosis not present

## 2022-10-30 DIAGNOSIS — R202 Paresthesia of skin: Secondary | ICD-10-CM

## 2022-10-30 NOTE — Patient Instructions (Signed)

## 2022-10-30 NOTE — Progress Notes (Addendum)
I,Elizabeth Mcfarland, CMA,acting as a Neurosurgeon for Elizabeth Aliment, MD.,have documented all relevant documentation on the behalf of Elizabeth Aliment, MD,as directed by  Elizabeth Aliment, MD while in the presence of Elizabeth Aliment, MD.  Subjective:  Patient ID: Elizabeth Mcfarland , female    DOB: 1940-12-14 , 82 y.o.   MRN: [id number]  Chief Complaint  Patient presents with   Diabetes   Hypertension    HPI  Patient presents today for bp & dm follow up. She reports compliance with medications. Denies headache, chest pain, and SOB. She has no other concerns or complaints at this time.      Diabetes She presents for her follow-up diabetic visit. She has type 2 diabetes mellitus. Her disease course has been stable. There are no hypoglycemic associated symptoms. Pertinent negatives for hypoglycemia include no dizziness or headaches. Pertinent negatives for diabetes include no chest pain, no polydipsia, no polyphagia and no polyuria. There are no hypoglycemic complications. Symptoms are stable. Diabetic complications include heart disease. Risk factors for coronary artery disease include hypertension, sedentary lifestyle, dyslipidemia and diabetes mellitus. Current diabetic treatment includes oral agent (dual therapy). She is compliant with treatment all of the time. Her weight is stable. She is following a generally healthy diet. When asked about meal planning, she reported none. She participates in exercise daily (gardening). (Blood sugars was 210 last night, she took her glyburide at bedtime and her blood sugar this morning was 92 after taking a 1/2 tablet) An ACE inhibitor/angiotensin II receptor blocker is being taken. Eye exam is current.  Hypertension This is a chronic problem. The current episode started more than 1 year ago. The problem is unchanged. Pertinent negatives include no chest pain, headaches or palpitations. Past treatments include ACE inhibitors. The current treatment provides significant  improvement.     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   Cataract    Chronic kidney disease    Diabetes mellitus without complication (HCC)    Esophageal ulcer with bleeding 05/05/2013   05/05/2013 EGD linear distal esophageal ulcer - cause of hematemesis prior to EGD    GERD (gastroesophageal reflux disease)    History of kidney stones    Hyperlipidemia    Hypertension    Kidney cysts    Personal history of radiation therapy    Sarcoidosis 1970     Family History  Problem Relation Age of Onset   Early death Mother    Stroke Father    Diabetes Sister    Kidney failure Sister    Colon polyps Sister    Stroke Brother    Colon polyps Brother    Cancer Brother 36       prostate cancer   Heart disease Brother    Emphysema Brother    Cancer Brother 80       prostate cancer   Asthma Brother    CAD Maternal Grandmother    Breast cancer Neg Hx    Colon cancer Neg Hx    Esophageal cancer Neg Hx    Rectal cancer Neg Hx    Stomach cancer Neg Hx      Current Outpatient Medications:    acetaminophen (TYLENOL) 500 MG tablet, Take 1,000 mg by mouth every 6 (six) hours as needed for mild pain or moderate pain., Disp: ,  Rfl:    amLODipine (NORVASC) 10 MG tablet, TAKE 1 TABLET(10 MG) BY MOUTH DAILY, Disp: 90 tablet, Rfl: 1   anastrozole (ARIMIDEX) 1 MG tablet, TAKE 1 TABLET(1 MG) BY MOUTH DAILY, Disp: 30 tablet, Rfl: 6   aspirin EC 81 MG tablet, Take 81 mg by mouth daily., Disp: , Rfl:    atorvastatin (LIPITOR) 80 MG tablet, Take 1 tablet (80 mg total) by mouth daily., Disp: 90 tablet, Rfl: 3   cholecalciferol (VITAMIN D) 1000 UNITS tablet, Take 1,000 Units by mouth daily., Disp: , Rfl:    glimepiride (AMARYL) 4 MG tablet, TAKE 1 TABLET BY MOUTH EVERY MORNING, Disp: 90 tablet, Rfl: 1   JANUVIA 100 MG tablet, TAKE 1 TABLET(100 MG) BY MOUTH  DAILY, Disp: 30 tablet, Rfl: 1   Multiple Vitamins-Minerals (CENTRUM SILVER 50+WOMEN PO), Take 1 tablet by mouth daily., Disp: , Rfl:    olmesartan (BENICAR) 40 MG tablet, TAKE 1 TABLET(40 MG) BY MOUTH DAILY, Disp: 90 tablet, Rfl: 1   ONETOUCH ULTRA test strip, USE AS DIRECTED TWICE DAILY, Disp: 100 strip, Rfl: 3   Polyethyl Glycol-Propyl Glycol (SYSTANE OP), Place 1 drop into both eyes 2 (two) times daily., Disp: , Rfl:    polyethylene glycol (MIRALAX / GLYCOLAX) packet, Take 17 g by mouth daily. , Disp: , Rfl:    Ascorbic Acid (VITAMIN C) 1000 MG tablet, Take 1,000 mg by mouth daily. (Patient not taking: Reported on 10/30/2022), Disp: , Rfl:    chlorthalidone (HYGROTON) 25 MG tablet, TAKE 1/2 TABLET BY MOUTH EVERY DAY, Disp: 30 tablet, Rfl: 1   metoprolol succinate (TOPROL-XL) 25 MG 24 hr tablet, TAKE ONE-HALF OF A TABLET BY MOUTH DAILY (Patient not taking: Reported on 09/23/2022), Disp: 45 tablet, Rfl: 3   nitroGLYCERIN (NITROSTAT) 0.4 MG SL tablet, Place 1 tablet (0.4 mg total) under the tongue every 5 (five) minutes as needed for chest pain., Disp: 25 tablet, Rfl: 1   pantoprazole (PROTONIX) 40 MG tablet, TAKE 1 TABLET BY MOUTH DAILY (Patient not taking: Reported on 10/23/2022), Disp: 90 tablet, Rfl: 3   Allergies  Allergen Reactions   Codeine Nausea And Vomiting   Percocet [Oxycodone-Acetaminophen] Nausea And Vomiting     Review of Systems  Constitutional: Negative.   Respiratory: Negative.    Cardiovascular: Negative.  Negative for chest pain and palpitations.  Gastrointestinal: Negative.   Endocrine: Negative for polydipsia, polyphagia and polyuria.  Musculoskeletal:  Positive for back pain.       She adds, experiencing lower back pain. She states her sx started about six months ago. She denies fall/trauma. She reports having an x-ray awhile ago, and she has arthritis. She was hesitant to take Tylenol b/c of her other meds. She wants to know if it is okay to take.   Skin: Negative.    Neurological:  Positive for numbness. Negative for dizziness and headaches.  Psychiatric/Behavioral: Negative.       Today's Vitals   10/30/22 0954  BP: 120/70  Pulse: 64  Temp: 98.5 F (36.9 C)  SpO2: 98%  Weight: 124 lb 9.6 oz (56.5 kg)  Height: 5\' 2"  (1.575 m)   Body mass index is 22.79 kg/m.  Wt Readings from Last 3 Encounters:  10/30/22 124 lb 9.6 oz (56.5 kg)  10/23/22 125 lb (56.7 kg)  09/23/22 125 lb (56.7 kg)     Objective:  Physical Exam Vitals and nursing note reviewed.  Constitutional:      Appearance: Normal appearance.  HENT:  Head: Normocephalic and atraumatic.  Eyes:     Extraocular Movements: Extraocular movements intact.  Cardiovascular:     Rate and Rhythm: Normal rate and regular rhythm.     Heart sounds: Normal heart sounds.  Pulmonary:     Effort: Pulmonary effort is normal.     Breath sounds: Normal breath sounds.  Musculoskeletal:     Cervical back: Normal range of motion.  Skin:    General: Skin is warm.  Neurological:     General: No focal deficit present.     Mental Status: She is alert.  Psychiatric:        Mood and Affect: Mood normal.        Behavior: Behavior normal.         Assessment And Plan:  Type 2 diabetes mellitus with diabetic peripheral angiopathy without gangrene, without long-term current use of insulin (HCC) Assessment & Plan: Chronic, she will c/w Januvia 100mg  and glimepriride 4mg  daily. She is encouraged to follow dietary recommendations.    Hypertensive heart disease without heart failure Assessment & Plan: Chronic, well controlled. She will continue with amlodipine 10mg  daily, olemsartan 40mg  daily,  and metoprolol Xl 25mg  1/2 tab daily.  She is encouraged to follow low sodium diet.    Coronary artery disease involving native coronary artery of native heart with angina pectoris [facility]) Assessment & Plan: Chronic, she will c/w ASA, Bblocker and statin therapy. She is encouraged to follow a heart healthy  diet.    Mixed hyperlipidemia Assessment & Plan: Chronic, LDL goal is less than 70.  She will continue with atorvastatin 80mg  daily.    Chronic midline low back pain without sciatica Assessment & Plan: June 2024 xray results reviewed. 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 This could be contributing to tingling RLE. Pt advised to take Tylenol as needed. If persistent, will consider physical therapy OR gabapentin 100mg  nightly. She is also encouraged to follow an anti-inflammatory diet.    Paresthesia and pain of right extremity     Return in 2 months (on 12/30/2022), or dm check.  Patient was given opportunity to ask questions. Patient verbalized understanding of the plan and was able to repeat key elements of the plan. All questions were answered to their satisfaction.    I, Elizabeth Aliment, MD, have reviewed all documentation for this visit. The documentation on 10/30/22 for the exam, diagnosis, procedures, and orders are all accurate and complete.   IF YOU HAVE BEEN REFERRED TO A SPECIALIST, IT MAY TAKE 1-2 WEEKS TO SCHEDULE/PROCESS THE REFERRAL. IF YOU HAVE NOT HEARD FROM US/SPECIALIST IN TWO WEEKS, PLEASE GIVE [place] A CALL AT [phone].   THE PATIENT IS ENCOURAGED TO PRACTICE SOCIAL DISTANCING DUE TO THE COVID-19 PANDEMIC.

## 2022-11-07 ENCOUNTER — Other Ambulatory Visit: Payer: Self-pay | Admitting: Nurse Practitioner

## 2022-11-08 DIAGNOSIS — R202 Paresthesia of skin: Secondary | ICD-10-CM | POA: Insufficient documentation

## 2022-11-08 NOTE — Assessment & Plan Note (Addendum)
June 2024 xray results reviewed. 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 This could be contributing to tingling RLE. Pt advised to take Tylenol as needed. If persistent, will consider physical therapy OR gabapentin 100mg  nightly. She is also encouraged to follow an anti-inflammatory diet.

## 2022-11-08 NOTE — Assessment & Plan Note (Signed)
Chronic, she will c/w ASA, Bblocker and statin therapy. She is encouraged to follow a heart healthy diet.

## 2022-11-08 NOTE — Assessment & Plan Note (Signed)
Chronic, LDL goal is less than 70.  She will continue with atorvastatin 80mg  daily.

## 2022-11-08 NOTE — Assessment & Plan Note (Signed)
Chronic, well controlled. She will continue with amlodipine 10mg  daily, olemsartan 40mg  daily,  and metoprolol Xl 25mg  1/2 tab daily.  She is encouraged to follow low sodium diet.

## 2022-11-08 NOTE — Assessment & Plan Note (Signed)
Chronic, she will c/w Januvia 100mg  and glimepriride 4mg  daily. She is encouraged to follow dietary recommendations.

## 2022-11-22 DIAGNOSIS — E1142 Type 2 diabetes mellitus with diabetic polyneuropathy: Secondary | ICD-10-CM | POA: Diagnosis not present

## 2022-11-22 DIAGNOSIS — I10 Essential (primary) hypertension: Secondary | ICD-10-CM | POA: Diagnosis not present

## 2022-11-26 DIAGNOSIS — I10 Essential (primary) hypertension: Secondary | ICD-10-CM | POA: Diagnosis not present

## 2022-11-26 DIAGNOSIS — E1142 Type 2 diabetes mellitus with diabetic polyneuropathy: Secondary | ICD-10-CM | POA: Diagnosis not present

## 2022-12-03 ENCOUNTER — Other Ambulatory Visit: Payer: Self-pay | Admitting: Cardiovascular Disease

## 2022-12-03 ENCOUNTER — Other Ambulatory Visit: Payer: Self-pay | Admitting: Nurse Practitioner

## 2022-12-09 ENCOUNTER — Other Ambulatory Visit: Payer: Self-pay

## 2022-12-09 DIAGNOSIS — E1151 Type 2 diabetes mellitus with diabetic peripheral angiopathy without gangrene: Secondary | ICD-10-CM

## 2022-12-09 MED ORDER — GLIMEPIRIDE 4 MG PO TABS: ORAL_TABLET | ORAL | 1 refills | Status: DC

## 2022-12-15 ENCOUNTER — Other Ambulatory Visit: Payer: Self-pay | Admitting: Nurse Practitioner

## 2022-12-22 DIAGNOSIS — E1142 Type 2 diabetes mellitus with diabetic polyneuropathy: Secondary | ICD-10-CM | POA: Diagnosis not present

## 2022-12-22 DIAGNOSIS — I10 Essential (primary) hypertension: Secondary | ICD-10-CM | POA: Diagnosis not present

## 2022-12-25 DIAGNOSIS — H401131 Primary open-angle glaucoma, bilateral, mild stage: Secondary | ICD-10-CM | POA: Diagnosis not present

## 2022-12-26 DIAGNOSIS — E1142 Type 2 diabetes mellitus with diabetic polyneuropathy: Secondary | ICD-10-CM | POA: Diagnosis not present

## 2022-12-26 DIAGNOSIS — I10 Essential (primary) hypertension: Secondary | ICD-10-CM | POA: Diagnosis not present

## 2023-01-09 ENCOUNTER — Ambulatory Visit
Admission: RE | Admit: 2023-01-09 | Discharge: 2023-01-09 | Disposition: A | Discharge status: Home / Self Care | Payer: Medicare Other | Source: Ambulatory Visit | Attending: Nurse Practitioner

## 2023-01-09 DIAGNOSIS — C50211 Malignant neoplasm of upper-inner quadrant of right female breast: Secondary | ICD-10-CM

## 2023-01-09 DIAGNOSIS — Z1231 Encounter for screening mammogram for malignant neoplasm of breast: Secondary | ICD-10-CM | POA: Diagnosis not present

## 2023-01-13 ENCOUNTER — Encounter: Payer: Self-pay | Admitting: Nurse Practitioner

## 2023-01-22 DIAGNOSIS — E1142 Type 2 diabetes mellitus with diabetic polyneuropathy: Secondary | ICD-10-CM | POA: Diagnosis not present

## 2023-01-22 DIAGNOSIS — I10 Essential (primary) hypertension: Secondary | ICD-10-CM | POA: Diagnosis not present

## 2023-01-25 DIAGNOSIS — E1142 Type 2 diabetes mellitus with diabetic polyneuropathy: Secondary | ICD-10-CM | POA: Diagnosis not present

## 2023-01-25 DIAGNOSIS — I10 Essential (primary) hypertension: Secondary | ICD-10-CM | POA: Diagnosis not present

## 2023-02-20 ENCOUNTER — Other Ambulatory Visit: Payer: Self-pay | Admitting: Nurse Practitioner

## 2023-02-21 DIAGNOSIS — E1142 Type 2 diabetes mellitus with diabetic polyneuropathy: Secondary | ICD-10-CM | POA: Diagnosis not present

## 2023-02-21 DIAGNOSIS — I10 Essential (primary) hypertension: Secondary | ICD-10-CM | POA: Diagnosis not present

## 2023-02-23 ENCOUNTER — Other Ambulatory Visit: Payer: Self-pay

## 2023-02-23 MED ORDER — SITAGLIPTIN PHOSPHATE 100 MG PO TABS: ORAL_TABLET | ORAL | 2 refills | Status: DC

## 2023-02-24 DIAGNOSIS — I10 Essential (primary) hypertension: Secondary | ICD-10-CM | POA: Diagnosis not present

## 2023-02-24 DIAGNOSIS — E1142 Type 2 diabetes mellitus with diabetic polyneuropathy: Secondary | ICD-10-CM | POA: Diagnosis not present

## 2023-03-04 ENCOUNTER — Other Ambulatory Visit: Payer: Self-pay | Admitting: Nurse Practitioner

## 2023-03-04 DIAGNOSIS — I10 Essential (primary) hypertension: Secondary | ICD-10-CM

## 2023-03-10 DIAGNOSIS — E1142 Type 2 diabetes mellitus with diabetic polyneuropathy: Secondary | ICD-10-CM | POA: Diagnosis not present

## 2023-03-10 DIAGNOSIS — I10 Essential (primary) hypertension: Secondary | ICD-10-CM | POA: Diagnosis not present

## 2023-03-12 ENCOUNTER — Encounter: Payer: Self-pay | Admitting: Internal Medicine

## 2023-03-12 ENCOUNTER — Ambulatory Visit: Payer: Medicare Other | Admitting: Internal Medicine

## 2023-03-12 VITALS — BP 126/64 | HR 79 | Temp 98.6°F | Ht 62.0 in | Wt 124.2 lb

## 2023-03-12 DIAGNOSIS — E782 Mixed hyperlipidemia: Secondary | ICD-10-CM

## 2023-03-12 DIAGNOSIS — I119 Hypertensive heart disease without heart failure: Secondary | ICD-10-CM

## 2023-03-12 DIAGNOSIS — I25119 Atherosclerotic heart disease of native coronary artery with unspecified angina pectoris: Secondary | ICD-10-CM

## 2023-03-12 DIAGNOSIS — E1151 Type 2 diabetes mellitus with diabetic peripheral angiopathy without gangrene: Secondary | ICD-10-CM

## 2023-03-12 NOTE — Patient Instructions (Signed)

## 2023-03-12 NOTE — Progress Notes (Signed)
I,Victoria T Deloria Lair, CMA,acting as a Neurosurgeon for Gwynneth Aliment, MD.,have documented all relevant documentation on the behalf of Gwynneth Aliment, MD,as directed by  Gwynneth Aliment, MD while in the presence of Gwynneth Aliment, MD.  Subjective:  Patient ID: Elizabeth Mcfarland , female    DOB: 1940-09-26 , 82 y.o.   MRN: [id number]  Chief Complaint  Patient presents with   Diabetes   Hypertension   Hyperlipidemia    HPI  Patient presents today for bp & dm follow up. She reports compliance with medications. Denies headache, chest pain, and SOB. She has no other concerns or complaints at this time.   Her sugars range from 69-128.  She has only had one reading less than 70 in the past month.   Diabetes She presents for her follow-up diabetic visit. She has type 2 diabetes mellitus. Her disease course has been stable. There are no hypoglycemic associated symptoms. Pertinent negatives for hypoglycemia include no dizziness or headaches. Pertinent negatives for diabetes include no chest pain, no polydipsia, no polyphagia and no polyuria. There are no hypoglycemic complications. Symptoms are stable. Diabetic complications include heart disease. Risk factors for coronary artery disease include hypertension, sedentary lifestyle, dyslipidemia and diabetes mellitus. Current diabetic treatment includes oral agent (dual therapy). She is compliant with treatment all of the time. Her weight is stable. She is following a generally healthy diet. When asked about meal planning, she reported none. She participates in exercise daily (gardening). (Blood sugars was 210 last night, she took her glyburide at bedtime and her blood sugar this morning was 92 after taking a 1/2 tablet) An ACE inhibitor/angiotensin II receptor blocker is being taken. Eye exam is current.  Hypertension This is a chronic problem. The current episode started more than 1 year ago. The problem is unchanged. Pertinent negatives include no chest pain,  headaches or palpitations. Past treatments include ACE inhibitors. The current treatment provides significant improvement.     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   Cataract    Chronic kidney disease    Diabetes mellitus without complication (HCC)    Esophageal ulcer with bleeding 05/05/2013   05/05/2013 EGD linear distal esophageal ulcer - cause of hematemesis prior to EGD    GERD (gastroesophageal reflux disease)    History of kidney stones    Hyperlipidemia    Hypertension    Kidney cysts    Personal history of radiation therapy    Sarcoidosis 1970     Family History  Problem Relation Age of Onset   Early death Mother    Stroke Father    Diabetes Sister    Kidney failure Sister    Colon polyps Sister    Stroke Brother    Colon polyps Brother    Cancer Brother 50       prostate cancer   Heart disease Brother    Emphysema Brother    Cancer Brother 51       prostate cancer   Asthma Brother    CAD Maternal Grandmother    Breast cancer Neg Hx    Colon cancer Neg Hx    Esophageal cancer Neg Hx    Rectal cancer Neg Hx    Stomach cancer Neg Hx      Current Outpatient Medications:    acetaminophen (TYLENOL) 500  MG tablet, Take 1,000 mg by mouth every 6 (six) hours as needed for mild pain or moderate pain., Disp: , Rfl:    amLODipine (NORVASC) 10 MG tablet, TAKE 1 TABLET(10 MG) BY MOUTH DAILY, Disp: 90 tablet, Rfl: 1   anastrozole (ARIMIDEX) 1 MG tablet, TAKE 1 TABLET(1 MG) BY MOUTH DAILY, Disp: 30 tablet, Rfl: 6   aspirin EC 81 MG tablet, Take 81 mg by mouth daily., Disp: , Rfl:    atorvastatin (LIPITOR) 80 MG tablet, Take 1 tablet (80 mg total) by mouth daily., Disp: 90 tablet, Rfl: 3   chlorthalidone (HYGROTON) 25 MG tablet, TAKE 1/2 TABLET BY MOUTH EVERY DAY (Patient not taking: Reported on 03/16/2023), Disp: 30 tablet, Rfl:  1   cholecalciferol (VITAMIN D) 1000 UNITS tablet, Take 1,000 Units by mouth daily., Disp: , Rfl:    glimepiride (AMARYL) 4 MG tablet, TAKE 1 TABLET BY MOUTH EVERY MORNING, Disp: 90 tablet, Rfl: 1   Multiple Vitamins-Minerals (CENTRUM SILVER 50+WOMEN PO), Take 1 tablet by mouth daily., Disp: , Rfl:    olmesartan (BENICAR) 40 MG tablet, TAKE 1 TABLET(40 MG) BY MOUTH DAILY, Disp: 90 tablet, Rfl: 1   ONETOUCH ULTRA test strip, USE AS DIRECTED TWICE DAILY, Disp: 100 strip, Rfl: 3   Polyethyl Glycol-Propyl Glycol (SYSTANE OP), Place 1 drop into both eyes 2 (two) times daily., Disp: , Rfl:    polyethylene glycol (MIRALAX / GLYCOLAX) packet, Take 17 g by mouth daily. , Disp: , Rfl:    sitaGLIPtin (JANUVIA) 100 MG tablet, TAKE 1 TABLET ( 100MG ) BY MOUTH DAILY., Disp: 90 tablet, Rfl: 2   Ascorbic Acid (VITAMIN C) 1000 MG tablet, Take 1,000 mg by mouth daily. (Patient not taking: Reported on 10/30/2022), Disp: , Rfl:    metoprolol succinate (TOPROL-XL) 25 MG 24 hr tablet, TAKE ONE-HALF OF A TABLET BY MOUTH DAILY (Patient not taking: Reported on 03/16/2023), Disp: 45 tablet, Rfl: 3   nitroGLYCERIN (NITROSTAT) 0.4 MG SL tablet, Place 1 tablet (0.4 mg total) under the tongue every 5 (five) minutes as needed for chest pain., Disp: 25 tablet, Rfl: 1   ondansetron (ZOFRAN) 4 MG tablet, Take 1 tablet (4 mg total) by mouth every 6 (six) hours., Disp: 12 tablet, Rfl: 0   pantoprazole (PROTONIX) 40 MG tablet, TAKE 1 TABLET BY MOUTH DAILY (Patient not taking: Reported on 10/23/2022), Disp: 90 tablet, Rfl: 3   Allergies  Allergen Reactions   Codeine Nausea And Vomiting   Percocet [Oxycodone-Acetaminophen] Nausea And Vomiting     Review of Systems  Constitutional: Negative.   Respiratory: Negative.    Cardiovascular: Negative.  Negative for chest pain and palpitations.  Endocrine: Negative for polydipsia, polyphagia and polyuria.  Neurological: Negative.  Negative for dizziness and headaches.   Psychiatric/Behavioral: Negative.       Today's Vitals   03/12/23 1053  BP: 126/64  Pulse: 79  Temp: 98.6 F (37 C)  SpO2: 98%  Weight: 124 lb 3.2 oz (56.3 kg)  Height: 5\' 2"  (1.575 m)   Body mass index is 22.72 kg/m.  Wt Readings from Last 3 Encounters:  03/16/23 124 lb (56.2 kg)  03/12/23 124 lb 3.2 oz (56.3 kg)  10/30/22 124 lb 9.6 oz (56.5 kg)     Objective:  Physical Exam Vitals and nursing note reviewed.  Constitutional:      Appearance: Normal appearance.  HENT:     Head: Normocephalic and atraumatic.  Eyes:     Extraocular Movements: Extraocular movements intact.  Cardiovascular:  Rate and Rhythm: Normal rate and regular rhythm.     Heart sounds: Normal heart sounds.  Pulmonary:     Effort: Pulmonary effort is normal.     Breath sounds: Normal breath sounds.  Musculoskeletal:     Cervical back: Normal range of motion.  Skin:    General: Skin is warm.  Neurological:     General: No focal deficit present.     Mental Status: She is alert.  Psychiatric:        Mood and Affect: Mood normal.        Behavior: Behavior normal.         Assessment And Plan:  Type 2 diabetes mellitus with diabetic peripheral angiopathy without gangrene, without long-term current use of insulin (HCC) Assessment & Plan: Chronic, she will c/w Januvia 100mg  and glimepriride 4mg  daily. She is encouraged to follow dietary recommendations. She agrees to let me know if her sugars are consistently below 80.   Orders: -     Hemoglobin A1c -     BMP8+eGFR  Hypertensive heart disease without heart failure Assessment & Plan: Chronic, well controlled. She will continue with amlodipine 10mg  daily, olemsartan 40mg  daily,  and metoprolol Xl 25mg  1/2 tab daily.  She is encouraged to follow low sodium diet.    Coronary artery disease involving native coronary artery of native heart with angina pectoris [facility]) Assessment & Plan: Chronic, status post stent placement in 2023.  She will  c/w ASA, B-blocker and statin therapy. She is encouraged to follow a heart healthy diet.    Mixed hyperlipidemia Assessment & Plan: Chronic, LDL goal is less than 70.  She will continue with atorvastatin 80mg  daily. She is encouraged to follow heart healthy diet.   Orders: -     TSH     Return in 3 months (on 06/10/2023), or dm check, for Monday - [place] for bp check.  Patient was given opportunity to ask questions. Patient verbalized understanding of the plan and was able to repeat key elements of the plan. All questions were answered to their satisfaction.    I, Gwynneth Aliment, MD, have reviewed all documentation for this visit. The documentation on 03/12/23 for the exam, diagnosis, procedures, and orders are all accurate and complete.   IF YOU HAVE BEEN REFERRED TO A SPECIALIST, IT MAY TAKE 1-2 WEEKS TO SCHEDULE/PROCESS THE REFERRAL. IF YOU HAVE NOT HEARD FROM US/SPECIALIST IN TWO WEEKS, PLEASE GIVE [place] A CALL AT [phone].   THE PATIENT IS ENCOURAGED TO PRACTICE SOCIAL DISTANCING DUE TO THE COVID-19 PANDEMIC.

## 2023-03-13 LAB — TSH: TSH: 1.05 u[IU]/mL (ref 0.450–4.500)

## 2023-03-13 LAB — BMP8+EGFR
BUN/Creatinine Ratio: 12 (ref 12–28)
BUN: 9 mg/dL (ref 8–27)
CO2: 24 mmol/L (ref 20–29)
Calcium: 9.6 mg/dL (ref 8.7–10.3)
Chloride: 103 mmol/L (ref 96–106)
Creatinine, Ser: 0.73 mg/dL (ref 0.57–1.00)
Glucose: 151 mg/dL — ABNORMAL HIGH (ref 70–99)
Potassium: 4.3 mmol/L (ref 3.5–5.2)
Sodium: 141 mmol/L (ref 134–144)
eGFR: 83 mL/min/{1.73_m2} (ref >59)

## 2023-03-13 LAB — HEMOGLOBIN A1C
Est. average glucose Bld gHb Est-mCnc: 143 mg/dL
Hgb A1c MFr Bld: 6.6 % — ABNORMAL HIGH (ref 4.8–5.6)

## 2023-03-16 ENCOUNTER — Ambulatory Visit: Payer: Medicare Other

## 2023-03-16 VITALS — BP 120/62 | HR 67 | Temp 98.6°F | Ht 62.0 in | Wt 124.0 lb

## 2023-03-16 DIAGNOSIS — I10 Essential (primary) hypertension: Secondary | ICD-10-CM

## 2023-03-16 NOTE — Progress Notes (Signed)
Patient presents today for a BP check. Patient currently taking amLODipine 10mg  PM, Chlorthalidone 25mg  1/2 tablet (patient reports she doesn't take it), olmesartan 40mg  AM.  BP Readings from Last 3 Encounters:  03/16/23 (!) 140/70  03/12/23 126/64  10/30/22 120/70   Per provider- Continue with current medications - cut back on salt.

## 2023-03-17 ENCOUNTER — Encounter (HOSPITAL_COMMUNITY): Payer: Self-pay

## 2023-03-17 ENCOUNTER — Emergency Department (HOSPITAL_COMMUNITY)
Admission: EM | Admit: 2023-03-17 | Discharge: 2023-03-17 | Disposition: A | Discharge status: Home / Self Care | Payer: Medicare Other | Attending: Emergency Medicine | Admitting: Emergency Medicine

## 2023-03-17 ENCOUNTER — Other Ambulatory Visit: Payer: Self-pay

## 2023-03-17 DIAGNOSIS — E1165 Type 2 diabetes mellitus with hyperglycemia: Secondary | ICD-10-CM | POA: Diagnosis not present

## 2023-03-17 DIAGNOSIS — Z7984 Long term (current) use of oral hypoglycemic drugs: Secondary | ICD-10-CM | POA: Insufficient documentation

## 2023-03-17 DIAGNOSIS — R42 Dizziness and giddiness: Secondary | ICD-10-CM | POA: Diagnosis present

## 2023-03-17 DIAGNOSIS — I10 Essential (primary) hypertension: Secondary | ICD-10-CM | POA: Insufficient documentation

## 2023-03-17 DIAGNOSIS — Z79899 Other long term (current) drug therapy: Secondary | ICD-10-CM | POA: Diagnosis not present

## 2023-03-17 DIAGNOSIS — I2510 Atherosclerotic heart disease of native coronary artery without angina pectoris: Secondary | ICD-10-CM | POA: Diagnosis not present

## 2023-03-17 DIAGNOSIS — R55 Syncope and collapse: Secondary | ICD-10-CM | POA: Insufficient documentation

## 2023-03-17 DIAGNOSIS — Z7982 Long term (current) use of aspirin: Secondary | ICD-10-CM | POA: Insufficient documentation

## 2023-03-17 DIAGNOSIS — R110 Nausea: Secondary | ICD-10-CM | POA: Diagnosis not present

## 2023-03-17 LAB — URINALYSIS, ROUTINE W REFLEX MICROSCOPIC
Bilirubin Urine: NEGATIVE
Glucose, UA: NEGATIVE mg/dL
Hgb urine dipstick: NEGATIVE
Ketones, ur: NEGATIVE mg/dL
Leukocytes,Ua: NEGATIVE
Nitrite: NEGATIVE
Protein, ur: NEGATIVE mg/dL
Specific Gravity, Urine: 1.009 (ref 1.005–1.030)
pH: 7 (ref 5.0–8.0)

## 2023-03-17 LAB — CBC
HCT: 39.8 % (ref 36.0–46.0)
Hemoglobin: 13 g/dL (ref 12.0–15.0)
MCH: 30.7 pg (ref 26.0–34.0)
MCHC: 32.7 g/dL (ref 30.0–36.0)
MCV: 94.1 fL (ref 80.0–100.0)
Platelets: 171 10*3/uL (ref 150–400)
RBC: 4.23 MIL/uL (ref 3.87–5.11)
RDW: 11.9 % (ref 11.5–15.5)
WBC: 9.8 10*3/uL (ref 4.0–10.5)
nRBC: 0 % (ref 0.0–0.2)

## 2023-03-17 LAB — BASIC METABOLIC PANEL
Anion gap: 7 (ref 5–15)
BUN: 12 mg/dL (ref 8–23)
CO2: 25 mmol/L (ref 22–32)
Calcium: 9.2 mg/dL (ref 8.9–10.3)
Chloride: 105 mmol/L (ref 98–111)
Creatinine, Ser: 0.66 mg/dL (ref 0.44–1.00)
GFR, Estimated: >60 mL/min (ref >60)
Glucose, Bld: 174 mg/dL — ABNORMAL HIGH (ref 70–99)
Potassium: 3.6 mmol/L (ref 3.5–5.1)
Sodium: 137 mmol/L (ref 135–145)

## 2023-03-17 LAB — CBG MONITORING, ED: Glucose-Capillary: 150 mg/dL — ABNORMAL HIGH (ref 70–99)

## 2023-03-17 LAB — TROPONIN I (HIGH SENSITIVITY): Troponin I (High Sensitivity): 4 ng/L (ref <18)

## 2023-03-17 MED ORDER — ONDANSETRON HCL 4 MG PO TABS: 4.0000 mg | ORAL_TABLET | Freq: Four times a day (QID) | ORAL | 0 refills | Status: DC

## 2023-03-17 MED ORDER — LACTATED RINGERS IV BOLUS
500.0000 mL | Freq: Once | INTRAVENOUS | Status: AC
  Administered 2023-03-17: 500 mL via INTRAVENOUS

## 2023-03-17 MED ORDER — ONDANSETRON HCL 4 MG/2ML IJ SOLN
4.0000 mg | Freq: Once | INTRAMUSCULAR | Status: AC
  Administered 2023-03-17: 4 mg via INTRAVENOUS
  Filled 2023-03-17: qty 2

## 2023-03-17 NOTE — ED Notes (Addendum)
Pt ambulated approximately 20' using stand by assist without difficulty

## 2023-03-17 NOTE — ED Provider Triage Note (Signed)
Emergency Medicine Provider Triage Evaluation Note  Elizabeth Mcfarland , a 82 y.o. female  was evaluated in triage.  Pt complains of feeling dizzy with near syncope upon getting out of bed to go to the bathroom this morning.  Felt well going to bed last night.  Feels like she is going to pass out and lightheaded.  No room spinning.  No focal weakness, numbness or tingling.  No chest pain or shortness of breath.  No history of similar symptoms..  Review of Systems  Positive: Near syncope, dizziness Negative: Chest pain, shortness of breath  Physical Exam  BP (!) 168/72 (BP Location: Left Arm)   Pulse 64   Temp 97.6 F (36.4 C) (Oral)   Resp 15   SpO2 100%  Gen:   Awake, no distress resting with eyes closed Resp:  Normal effort  MSK:   Moves extremities without difficulty  Other:  CN 2-12 intact, no ataxia on finger to nose, no nystagmus, 5/5 strength throughout, no pronator drift,  No nystagmus, no ataxia finger-to-nose, gait not tested   Medical Decision Making  Medically screening exam initiated at 6:19 AM.  Appropriate orders placed.  Elizabeth Mcfarland was informed that the remainder of the evaluation will be completed by another provider, this initial triage assessment does not replace that evaluation, and the importance of remaining in the ED until their evaluation is complete.  Labs, EKG, IV fluids, orthostatics   Elizabeth Octave, MD 03/17/23 [phone]

## 2023-03-17 NOTE — ED Triage Notes (Addendum)
Patient arrives via EMS from home. Was getting up to use the bathroom and had a sudden onset of feeling like she would pass out, with nausea. Patient sat back down on her bed. No LOC, no fall. Patient has had urinary frequency. Denies any pain. Hx of cardiac stent. No blood thinners.

## 2023-03-17 NOTE — Discharge Instructions (Addendum)
Evaluation today was overall reassuring.  Recommending follow-up with PCP.  If you start experience dizziness, chest pain, palpitations, headache, visual disturbance, fever or any other concerning symptom please return emergency department further evaluation.

## 2023-03-17 NOTE — ED Provider Notes (Signed)
[place] EMERGENCY DEPARTMENT AT [facility] Provider Note   CSN: [id number] Arrival date & time: 03/17/23  0543     History  Chief Complaint  Patient presents with   Near Syncope   HPI Elizabeth Mcfarland is a 82 y.o. female with history of CAD status post stent placement in 2023, diabetes, sarcoidosis, hypertension hyperlipidemia, PAD presenting for near syncope.  States that around 4:45 AM this morning she was walking towards the bathroom and all of a sudden felt lightheaded and nauseous and felt like she was going to pass out.  Denies associated dizziness.  States she sat back down in her bed and her symptoms did improve somewhat.  Denies any associated chest pain, palpitations or shortness of breath.  Denies any recent nausea vomiting diarrhea.  Denies urinary symptoms.  Denies cough and fever.   Near Syncope       Home Medications Prior to Admission medications   Medication Sig Start Date End Date Taking? Authorizing Provider  ondansetron (ZOFRAN) 4 MG tablet Take 1 tablet (4 mg total) by mouth every 6 (six) hours. 03/17/23  Yes Gareth Eagle, PA-C  acetaminophen (TYLENOL) 500 MG tablet Take 1,000 mg by mouth every 6 (six) hours as needed for mild pain or moderate pain.    [provider]  amLODipine (NORVASC) 10 MG tablet TAKE 1 TABLET(10 MG) BY MOUTH DAILY 03/05/23   [name], FNP  anastrozole (ARIMIDEX) 1 MG tablet TAKE 1 TABLET(1 MG) BY MOUTH DAILY 12/03/22   Pollyann Samples, NP  Ascorbic Acid (VITAMIN C) 1000 MG tablet Take 1,000 mg by mouth daily. Patient not taking: Reported on 10/30/2022    [provider]  aspirin EC 81 MG tablet Take 81 mg by mouth daily.    [provider]  atorvastatin (LIPITOR) 80 MG tablet Take 1 tablet (80 mg total) by mouth daily. 10/07/22   Dorothyann Peng, MD  chlorthalidone (HYGROTON) 25 MG tablet TAKE 1/2 TABLET BY MOUTH EVERY DAY Patient not taking: Reported on 03/16/2023 11/10/22   Dorothyann Peng, MD   cholecalciferol (VITAMIN D) 1000 UNITS tablet Take 1,000 Units by mouth daily.    [provider]  glimepiride (AMARYL) 4 MG tablet TAKE 1 TABLET BY MOUTH EVERY MORNING 12/09/22   Dorothyann Peng, MD  metoprolol succinate (TOPROL-XL) 25 MG 24 hr tablet TAKE ONE-HALF OF A TABLET BY MOUTH DAILY Patient not taking: Reported on 03/16/2023 06/13/22   [name], Mihai, MD  Multiple Vitamins-Minerals (CENTRUM SILVER 50+WOMEN PO) Take 1 tablet by mouth daily.    [provider]  nitroGLYCERIN (NITROSTAT) 0.4 MG SL tablet Place 1 tablet (0.4 mg total) under the tongue every 5 (five) minutes as needed for chest pain. 04/09/21 10/23/22  Jonita Albee, PA-C  olmesartan (BENICAR) 40 MG tablet TAKE 1 TABLET(40 MG) BY MOUTH DAILY 03/05/23   [name], FNP  [facility] ULTRA test strip USE AS DIRECTED TWICE DAILY 01/18/21   [name], FNP  pantoprazole (PROTONIX) 40 MG tablet TAKE 1 TABLET BY MOUTH DAILY Patient not taking: Reported on 10/23/2022 06/13/22   [name], Mihai, MD  Polyethyl Glycol-Propyl Glycol (SYSTANE OP) Place 1 drop into both eyes 2 (two) times daily.    [provider]  polyethylene glycol (MIRALAX / GLYCOLAX) packet Take 17 g by mouth daily.     [provider]  sitaGLIPtin (JANUVIA) 100 MG tablet TAKE 1 TABLET ( 100MG ) BY MOUTH DAILY. 02/23/23   Dorothyann Peng, MD  Allergies    Codeine and Percocet [oxycodone-acetaminophen]    Review of Systems   Review of Systems  Cardiovascular:  Positive for near-syncope.    Physical Exam Updated Vital Signs BP (!) 168/72 (BP Location: Left Arm)   Pulse 64   Temp 97.6 F (36.4 C) (Oral)   Resp 15   SpO2 100%  Physical Exam Vitals and nursing note reviewed.  HENT:     Head: Normocephalic and atraumatic.     Mouth/Throat:     Mouth: Mucous membranes are moist.  Eyes:     General:        Right eye: No discharge.        Left eye: No discharge.     Conjunctiva/sclera: Conjunctivae normal.   Cardiovascular:     Rate and Rhythm: Normal rate and regular rhythm.     Pulses: Normal pulses.     Heart sounds: Normal heart sounds.  Pulmonary:     Effort: Pulmonary effort is normal.     Breath sounds: Normal breath sounds.  Abdominal:     General: Abdomen is flat.     Palpations: Abdomen is soft.  Skin:    General: Skin is warm and dry.  Neurological:     General: No focal deficit present.     Comments: GCS 15. Speech is goal oriented. No deficits appreciated to CN III-XII; symmetric eyebrow raise, no facial drooping, tongue midline. Patient has equal grip strength bilaterally with 5/5 strength against resistance in all major muscle groups bilaterally. Sensation to light touch intact. Patient moves extremities without ataxia. Normal finger-nose-finger. Patient ambulatory with steady gait.   Psychiatric:        Mood and Affect: Mood normal.     ED Results / Procedures / Treatments   Labs (all labs ordered are listed, but only abnormal results are displayed) Labs Reviewed  BASIC METABOLIC PANEL - Abnormal; Notable for the following components:      Result Value   Glucose, Bld 174 (*)    All other components within normal limits  URINALYSIS, ROUTINE W REFLEX MICROSCOPIC - Abnormal; Notable for the following components:   Color, Urine STRAW (*)    All other components within normal limits  CBG MONITORING, ED - Abnormal; Notable for the following components:   Glucose-Capillary 150 (*)    All other components within normal limits  CBC  TROPONIN I (HIGH SENSITIVITY)  TROPONIN I (HIGH SENSITIVITY)    EKG EKG Interpretation Date/Time:  Tuesday March 17 2023 06:02:20 EST Ventricular Rate:  62 PR Interval:  161 QRS Duration:  91 QT Interval:  442 QTC Calculation: 449 R Axis:   66  Text Interpretation: Sinus rhythm Borderline T wave abnormalities   No Brugada, no prolonged QT No significant change was found Confirmed by Glynn Octave [phone]) on 03/17/2023 6:21:31  AM  Radiology No results found.  Procedures Procedures    Medications Ordered in ED Medications  lactated ringers bolus 500 mL (0 mLs Intravenous Stopped 03/17/23 0734)  ondansetron (ZOFRAN) injection 4 mg (4 mg Intravenous Given 03/17/23 6045)    ED Course/ Medical Decision Making/ A&P Clinical Course as of 03/17/23 0745  Tue Mar 17, 2023  0649 Ketones, ur: NEGATIVE [JR]    Clinical Course User Index [JR] Gareth Eagle, PA-C                                 Medical Decision Making Amount  and/or Complexity of Data Reviewed Labs: ordered.  Risk Prescription drug management.   Initial Impression and Ddx 82 year old well-appearing female presenting for near syncope.  Exam was unremarkable.  DDx includes stroke, arrhythmia, ACS, PE, electrolyte derangement, orthostasis, other.  Patient PMH that increases complexity of ED encounter:  history of CAD status post stent placement in 2023, diabetes, sarcoidosis, hypertension hyperlipidemia, PAD  Interpretation of Diagnostics - I independent reviewed and interpreted the labs as followed: hyperglycemia (150)  -I personally reviewed interpret EKG which revealed sinus rhythm and otherwise nonischemic  Patient Reassessment and Ultimate Disposition/Management After fluid resuscitation and Zofran, on reassessment, patient able to walk to and from the bathroom without any symptoms and with steady gait.  Workup was overall reassuring.  Doubt PE or ACS given no chest pain or shortness of breath and reassuring EKG.  Doubt stroke given no focal neurodeficits or associated symptoms.  Suspect this could be related to a mild dehydration.  Discussed pertinent return precautions.  Advised to follow-up with her PCP.  Sent home with Zofran.  Vital stable.  Discharged in good condition.  Patient management required discussion with the following services or consulting groups:  None  Complexity of Problems Addressed Acute complicated illness or  Injury  Additional Data Reviewed and Analyzed Further history obtained from: Further history from spouse/family member, Past medical history and medications listed in the EMR, and Prior ED visit notes  Patient Encounter Risk Assessment Prescriptions         Final Clinical Impression(s) / ED Diagnoses Final diagnoses:  Near syncope    Rx / DC Orders ED Discharge Orders          Ordered    ondansetron (ZOFRAN) 4 MG tablet  Every 6 hours        03/17/23 0744              Gareth Eagle, PA-C 03/17/23 0745    Glynn Octave, MD 03/21/23 [phone]

## 2023-03-23 NOTE — Assessment & Plan Note (Addendum)
Chronic, status post stent placement in 2023.  She will c/w ASA, B-blocker and statin therapy. She is encouraged to follow a heart healthy diet.

## 2023-03-23 NOTE — Assessment & Plan Note (Signed)
Chronic, LDL goal is less than 70.  She will continue with atorvastatin 80mg  daily. She is encouraged to follow heart healthy diet.

## 2023-03-23 NOTE — Assessment & Plan Note (Signed)
Chronic, she will c/w Januvia 100mg  and glimepriride 4mg  daily. She is encouraged to follow dietary recommendations. She agrees to let me know if her sugars are consistently below 80.

## 2023-03-23 NOTE — Assessment & Plan Note (Signed)
Chronic, well controlled. She will continue with amlodipine 10mg  daily, olemsartan 40mg  daily,  and metoprolol Xl 25mg  1/2 tab daily.  She is encouraged to follow low sodium diet.

## 2023-03-24 DIAGNOSIS — E1142 Type 2 diabetes mellitus with diabetic polyneuropathy: Secondary | ICD-10-CM | POA: Diagnosis not present

## 2023-03-24 DIAGNOSIS — I10 Essential (primary) hypertension: Secondary | ICD-10-CM | POA: Diagnosis not present

## 2023-03-26 DIAGNOSIS — E1142 Type 2 diabetes mellitus with diabetic polyneuropathy: Secondary | ICD-10-CM | POA: Diagnosis not present

## 2023-03-26 DIAGNOSIS — I10 Essential (primary) hypertension: Secondary | ICD-10-CM | POA: Diagnosis not present

## 2023-04-25 DIAGNOSIS — I10 Essential (primary) hypertension: Secondary | ICD-10-CM | POA: Diagnosis not present

## 2023-04-25 DIAGNOSIS — E1142 Type 2 diabetes mellitus with diabetic polyneuropathy: Secondary | ICD-10-CM | POA: Diagnosis not present

## 2023-04-26 NOTE — Assessment & Plan Note (Addendum)
01/09/2022 - screening mammogram negative  01/10/2023 -3D screening mammogram negative.  Patient taking anastrozole 1 mg daily Continues calcium and vitamin D along with low impact exercise every day. 04/27/2023 - breast exam benign except for  keloids present on lumpectomy scar of upper mid portion of the left breast. This is tender. There are radiation changes present of right breast. Will make appointment with Dr. Carolynne Edouard, surgeon, for further evaluation.  -labs with follow up in 6 months. Sooner if needed. Mammogram expected in October 2025.

## 2023-04-26 NOTE — Progress Notes (Unsigned)
Patient Care Team: Dorothyann Peng, MD as PCP - General (Internal Medicine) [name], Rachelle Hora, MD as PCP - Cardiology (Cardiology) Pershing Proud, RN as Oncology Nurse Navigator Donnelly Angelica, RN as Oncology Nurse Navigator Malachy Mood, MD as Consulting Physician (Hematology) Griselda Miner, MD as Consulting Physician (General Surgery) Antony Blackbird, MD as Consulting Physician (Radiation Oncology) Pollyann Samples, NP as Nurse Practitioner (Nurse Practitioner) Harlan Stains, RPH (Inactive) (Pharmacist) [place], Elisia Stepp, [place] (Optometry)  Clinic Day:  04/27/2023  Referring physician: Dorothyann Peng, MD  ASSESSMENT & PLAN:   Assessment & Plan: Malignant neoplasm of upper-inner quadrant of right breast in female, estrogen receptor positive (HCC) 01/09/2022 - screening mammogram negative  01/10/2023 -3D screening mammogram negative.  Patient taking anastrozole 1 mg daily Continues calcium and vitamin D along with low impact exercise every day. 04/27/2023 - breast exam benign except for  keloids present on lumpectomy scar of upper mid portion of the left breast. This is tender. There are radiation changes present of right breast. Will make appointment with Dr. Carolynne Edouard, surgeon, for further evaluation.  -labs with follow up in 6 months. Sooner if needed. Mammogram expected in October 2025.    Plan: Labs reviewed  -CBC showing WBC 5.4; Hgb 12.4; Hct 37.8; Plt 202; Anc 3.9 -CMP - K 3.7; glucose 172; BUN 12; Creatinine 0.76; eGFR >60; Ca 9.3; LFTs normal.   Breast exam benign - keloids present on lumpectomy scar of upper mid portion of the left breast. This is tender. There are radiation changes present of right breast. Will make appointment with Dr. Carolynne Edouard, surgeon, for further evaluation.  Labs with follow up in 6 months 3D screening mammogram to be scheduled for October 2025    The patient understands the plans discussed today and is in agreement with them.  She knows to contact our office if  she develops concerns prior to her next appointment.  I provided 25 minutes of face-to-face time during this encounter and > 50% was spent counseling as documented under my assessment and plan.    Carlean Jews, NP  [facility] - A DEPT OF [facility] [address] [place] [address] Dept: [phone] Dept Fax: [phone]   No orders of the defined types were placed in this encounter.     CHIEF COMPLAINT:  CC: Right breast cancer, estrogen receptor positive  Current Treatment: Anastrozole 1 mg daily  INTERVAL HISTORY:  Elizabeth Mcfarland is here today for repeat clinical assessment.  She was last seen by myself on 10/23/2022.  3D screening mammogram done 01/09/2023 was negative with recall expected in 1 year.  Has been clinically doing well. Does have pain associated with lumpectomy scar on left breast. Will make appointment with Dr. Carolynne Edouard, her sun, for further evaluation. She continues to do self breast exams. Has noted no new problems or concerns related to the breasts. She denies chest pain, chest pressure, or shortness of breath. She denies headaches or visual disturbances. She denies abdominal pain, nausea, vomiting, or changes in bowel or bladder habits.   She denies fevers or chills. She denies pain. Her appetite is good. Her weight has increased 4 pounds over last 2 months .  I have reviewed the past medical history, past surgical history, social history and family history with the patient and they are unchanged from previous note.  ALLERGIES:  is allergic to codeine and percocet [oxycodone-acetaminophen].  MEDICATIONS:  Current Outpatient Medications  Medication Sig Dispense Refill   acetaminophen (TYLENOL) 500 MG tablet Take 1,000 mg by mouth every 6 (six) hours as needed for mild pain or moderate pain.     amLODipine (NORVASC) 10 MG tablet TAKE 1 TABLET(10 MG) BY MOUTH DAILY 90 tablet 1   anastrozole (ARIMIDEX) 1  MG tablet TAKE 1 TABLET(1 MG) BY MOUTH DAILY 30 tablet 6   Ascorbic Acid (VITAMIN C) 1000 MG tablet Take 1,000 mg by mouth daily.     aspirin EC 81 MG tablet Take 81 mg by mouth daily.     atorvastatin (LIPITOR) 80 MG tablet Take 1 tablet (80 mg total) by mouth daily. 90 tablet 3   cholecalciferol (VITAMIN D) 1000 UNITS tablet Take 1,000 Units by mouth daily.     glimepiride (AMARYL) 4 MG tablet TAKE 1 TABLET BY MOUTH EVERY MORNING 90 tablet 1   Multiple Vitamins-Minerals (CENTRUM SILVER 50+WOMEN PO) Take 1 tablet by mouth daily.     olmesartan (BENICAR) 40 MG tablet TAKE 1 TABLET(40 MG) BY MOUTH DAILY 90 tablet 1   ondansetron (ZOFRAN) 4 MG tablet Take 1 tablet (4 mg total) by mouth every 6 (six) hours. 12 tablet 0   ONETOUCH ULTRA test strip USE AS DIRECTED TWICE DAILY 100 strip 3   pantoprazole (PROTONIX) 40 MG tablet TAKE 1 TABLET BY MOUTH DAILY 90 tablet 3   Polyethyl Glycol-Propyl Glycol (SYSTANE OP) Place 1 drop into both eyes 2 (two) times daily.     polyethylene glycol (MIRALAX / GLYCOLAX) packet Take 17 g by mouth daily.      sitaGLIPtin (JANUVIA) 100 MG tablet TAKE 1 TABLET ( 100MG ) BY MOUTH DAILY. 90 tablet 2   chlorthalidone (HYGROTON) 25 MG tablet TAKE 1/2 TABLET BY MOUTH EVERY DAY (Patient not taking: Reported on 04/27/2023) 30 tablet 1   metoprolol succinate (TOPROL-XL) 25 MG 24 hr tablet TAKE ONE-HALF OF A TABLET BY MOUTH DAILY (Patient not taking: Reported on 04/27/2023) 45 tablet 3   nitroGLYCERIN (NITROSTAT) 0.4 MG SL tablet Place 1 tablet (0.4 mg total) under the tongue every 5 (five) minutes as needed for chest pain. 25 tablet 1   No current facility-administered medications for this visit.    HISTORY OF PRESENT ILLNESS:   Oncology History Overview Note  Cancer Staging Malignant neoplasm of upper-inner quadrant of right breast in female, estrogen receptor positive (HCC) Staging form: Breast, AJCC 8th Edition - Clinical stage from 10/07/2018: cT2, cN0, cM0, GX, ER+, PR+,  HER2: Equivocal - Signed by Malachy Mood, MD on 10/12/2018 - Pathologic stage from 12/25/2018: Stage IA (pT2, pN0, cM0, G2, ER+, PR+, HER2-, Oncotype DX score: 21) - Signed by Malachy Mood, MD on 02/01/2019    Malignant neoplasm of upper-inner quadrant of right breast in female, estrogen receptor positive (HCC)  10/01/2018 Imaging   Bone Density Scan 10/01/18  IMPRESSION Lowest T-score of -0.9 at left femur neck (NORMAL) T-score DualFemur Neck Left  10/01/2018    76.1         -0.9    0.916 g/cm2   AP Spine  L1-L4      10/01/2018    76.1         1.2     1.326 g/cm2   DualFemur Total Mean 10/01/2018    76.1         0.1     1.024 g/cm2   10/05/2018 Mammogram   Diagnostic Mammogram 10/05/18 IMPRESSION: 1. Highly suspicious right breast mass 1 o'clock position 2 cm from the  nipple on the right. It measures 2.1 x 1.7 x 1.5 cm. Corresponding with the screening mammographic findings. Recommendation is for ultrasound-guided biopsy. 2. Indeterminate left breast mass at the 10 o'clock position 9 cm from the nipple. It measures 1.3 x 0.5 x 0.3 cm. This may represent a benign etiology such as a degenerating fibroadenoma. However, ultrasound-guided biopsy for definitive tissue diagnosis is recommended. 3. No suspicious lymphadenopathy bilaterally.   10/07/2018 Cancer Staging   Staging form: Breast, AJCC 8th Edition - Clinical stage from 10/07/2018: cT2, cN0, cM0, GX, ER+, PR+, HER2: Equivocal - Signed by Malachy Mood, MD on 10/12/2018   10/07/2018 Initial Biopsy   Diagnosis 10/07/18 1. Breast, right, needle core biopsy, 1 o'clock, 2cmfn - INVASIVE MAMMARY CARCINOMA WITH CALCIFICATIONS. SEE NOTE 2. Breast, left, needle core biopsy, 10 o'clock - FIBROADENOMA WITH DYSTROPHIC CALCIFICATIONS - NEGATIVE FOR CARCINOMA   10/07/2018 Receptors her2   By immunohistochemistry, the tumor cells are EQUIVOCAL for Her2 (2+). HER2 by FISH will be PERFORMED and the RESULTS REPORTED SEPARATELY Estrogen Receptor: 100%,  POSITIVE, STRONG STAINING INTENSITY Progesterone Receptor: 70%, POSITIVE, STRONG STAINING INTENSITY Proliferation Marker Ki67: 10%   10/11/2018 Initial Diagnosis   Malignant neoplasm of upper-inner quadrant of right breast in female, estrogen receptor positive (HCC)   10/2018 -  Neo-Adjuvant Anti-estrogen oral therapy   She started anastrozole end of 10/2018 before surgery.   11/25/2018 Surgery   BILATERAL BREAST LUMPECTOMIES  WITH BILATERAL  RADIOACTIVE SEEDS AND RIGHT SENTINEL LYMPH NODE BIOPSY by Dr Carolynne Edouard  11/24/28   11/25/2018 Pathology Results   Diagnosis 11/25/18 1. Breast, lumpectomy, Left w/seed - FIBROADENOMA WITH CALCIFICATIONS. - BIOPSY SITE CHANGES. 2. Lymph node, sentinel, biopsy, Right Axillary #1 - ONE LYMPH NODE, NEGATIVE FOR CARCINOMA (0/1). 3. Lymph node, sentinel, biopsy, Right Axillary #2 - ONE LYMPH NODE, NEGATIVE FOR CARCINOMA (0/1). 4. Lymph node, sentinel, biopsy, Right - ONE LYMPH NODE, NEGATIVE FOR CARCINOMA (0/1). 5. Breast, lumpectomy, Right w/seed - INVASIVE DUCTAL CARCINOMA, 2.6 CM, NOTTINGHAM GRADE 2 OF 3. - MARGINS OF RESECTION ARE NOT INVOLVED (CLOSEST MARGIN: LESS THAN 1 MM, SUPERIOR). - BIOPSY SITE CHANGES. - SEE ONCOLOGY TABLE. 6. Medical device, removal, radioactive seed - RADIOACTIVE SEED. - GROSS ONLY.   11/25/2018 Oncotype testing   Oncotype 11/25/18 Recurrence Score 21 with distant recurrence risk at 9 years of with Tamoxifen alone 7%.  There is less than 1% benefit from chemo.    12/25/2018 Cancer Staging   Staging form: Breast, AJCC 8th Edition - Pathologic stage from 12/25/2018: Stage IA (pT2, pN0, cM0, G2, ER+, PR+, HER2-, Oncotype DX score: 21) - Signed by Malachy Mood, MD on 02/01/2019   01/11/2019 - 02/08/2019 Radiation Therapy   Adjuvant Radiation with Dr Roselind Messier 01/11/19-02/08/19   05/05/2019 Survivorship   SCP delivered by Santiago Glad, NP        REVIEW OF SYSTEMS:   Constitutional: Denies fevers, chills or abnormal weight loss Eyes:  Denies blurriness of vision Ears, nose, mouth, throat, and face: Denies mucositis or sore throat Respiratory: Denies cough, dyspnea or wheezes Cardiovascular: Denies palpitation, chest discomfort or lower extremity swelling Gastrointestinal:  Denies nausea, heartburn or change in bowel habits Skin: Denies abnormal skin rashes Lymphatics: Denies new lymphadenopathy or easy bruising Neurological:Denies numbness, tingling or new weaknesses Behavioral/Psych: Mood is stable, no new changes  All other systems were reviewed with the patient and are negative.   VITALS:   Today's Vitals   04/27/23 0943 04/27/23 0945  BP: 99/82   Pulse: 66  Resp: 15   Temp: 98 F (36.7 C)   TempSrc: Temporal   SpO2: 100%   Weight: 128 lb 6.4 oz (58.2 kg)   PainSc:  4    Body mass index is 23.48 kg/m.   Wt Readings from Last 3 Encounters:  04/27/23 128 lb 6.4 oz (58.2 kg)  03/16/23 124 lb (56.2 kg)  03/12/23 124 lb 3.2 oz (56.3 kg)    Body mass index is 23.48 kg/m.  Performance status (ECOG): 1 - Symptomatic but completely ambulatory  PHYSICAL EXAM:   GENERAL:alert, no distress and comfortable SKIN: skin color, texture, turgor are normal, no rashes or significant lesions EYES: normal, Conjunctiva are pink and non-injected, sclera clear OROPHARYNX:no exudate, no erythema and lips, buccal mucosa, and tongue normal  NECK: supple, thyroid normal size, non-tender, without nodularity LYMPH:  no palpable lymphadenopathy in the cervical, axillary or inguinal LUNGS: clear to auscultation and percussion with normal breathing effort HEART: regular rate & rhythm and no murmurs and no lower extremity edema ABDOMEN:abdomen soft, non-tender and normal bowel sounds Musculoskeletal:no cyanosis of digits and no clubbing  NEURO: alert & oriented x 3 with fluent speech, no focal motor/sensory deficits BREAST: right breast is positive for radiation changes around the nipple and inferior aspect of the breast. No  nipple inversion or drainge noted. There is skin thickening and darkening. Well healed lumpectomy scars. No palpable lumps or masses appreciated today. There is no axillary lymphadenopathy on the right side. Keloid scar across the upper mid portion of the left breast. Tender and raised. No drainage or discharge from the nipple and no nipple inversion noted. There are no palpable masses or lumps appreciated today. There is no axillary lymphadenopathy on the left side.   LABORATORY DATA:  I have reviewed the data as listed    Component Value Date/Time   NA 138 04/27/2023 0911   NA 141 03/12/2023 1201   K 3.7 04/27/2023 0911   CL 104 04/27/2023 0911   CO2 31 04/27/2023 0911   GLUCOSE 172 (H) 04/27/2023 0911   BUN 12 04/27/2023 0911   BUN 9 03/12/2023 1201   CREATININE 0.76 04/27/2023 0911   CALCIUM 9.3 04/27/2023 0911   PROT 6.9 04/27/2023 0911   PROT 6.6 08/28/2022 1051   ALBUMIN 4.4 04/27/2023 0911   ALBUMIN 4.5 08/28/2022 1051   AST 27 04/27/2023 0911   ALT 24 04/27/2023 0911   ALKPHOS 74 04/27/2023 0911   BILITOT 1.0 04/27/2023 0911   GFRNONAA >60 04/27/2023 0911   GFRAA 64 12/20/2019 1605   GFRAA >60 08/01/2019 0845     Lab Results  Component Value Date   WBC 5.4 04/27/2023   NEUTROABS 3.9 04/27/2023   HGB 12.4 04/27/2023   HCT 37.8 04/27/2023   MCV 93.6 04/27/2023   PLT 202 04/27/2023

## 2023-04-27 ENCOUNTER — Inpatient Hospital Stay: Payer: Medicare Other | Attending: Nurse Practitioner

## 2023-04-27 ENCOUNTER — Inpatient Hospital Stay (HOSPITAL_BASED_OUTPATIENT_CLINIC_OR_DEPARTMENT_OTHER): Payer: Medicare Other | Admitting: Nurse Practitioner

## 2023-04-27 ENCOUNTER — Encounter: Payer: Self-pay | Admitting: Nurse Practitioner

## 2023-04-27 VITALS — BP 99/82 | HR 66 | Temp 98.0°F | Resp 15 | Wt 128.4 lb

## 2023-04-27 DIAGNOSIS — C50211 Malignant neoplasm of upper-inner quadrant of right female breast: Secondary | ICD-10-CM

## 2023-04-27 DIAGNOSIS — M8589 Other specified disorders of bone density and structure, multiple sites: Secondary | ICD-10-CM | POA: Insufficient documentation

## 2023-04-27 DIAGNOSIS — Z170 Estrogen receptor positive status [ER+]: Secondary | ICD-10-CM

## 2023-04-27 DIAGNOSIS — Z79811 Long term (current) use of aromatase inhibitors: Secondary | ICD-10-CM | POA: Diagnosis not present

## 2023-04-27 LAB — CMP (CANCER CENTER ONLY)
ALT: 24 U/L (ref 0–44)
AST: 27 U/L (ref 15–41)
Albumin: 4.4 g/dL (ref 3.5–5.0)
Alkaline Phosphatase: 74 U/L (ref 38–126)
Anion gap: 3 — ABNORMAL LOW (ref 5–15)
BUN: 12 mg/dL (ref 8–23)
CO2: 31 mmol/L (ref 22–32)
Calcium: 9.3 mg/dL (ref 8.9–10.3)
Chloride: 104 mmol/L (ref 98–111)
Creatinine: 0.76 mg/dL (ref 0.44–1.00)
GFR, Estimated: >60 mL/min (ref >60)
Glucose, Bld: 172 mg/dL — ABNORMAL HIGH (ref 70–99)
Potassium: 3.7 mmol/L (ref 3.5–5.1)
Sodium: 138 mmol/L (ref 135–145)
Total Bilirubin: 1 mg/dL (ref 0.0–1.2)
Total Protein: 6.9 g/dL (ref 6.5–8.1)

## 2023-04-27 LAB — CBC WITH DIFFERENTIAL (CANCER CENTER ONLY)
Abs Immature Granulocytes: 0.02 10*3/uL (ref 0.00–0.07)
Basophils Absolute: 0 10*3/uL (ref 0.0–0.1)
Basophils Relative: 1 %
Eosinophils Absolute: 0 10*3/uL (ref 0.0–0.5)
Eosinophils Relative: 1 %
HCT: 37.8 % (ref 36.0–46.0)
Hemoglobin: 12.4 g/dL (ref 12.0–15.0)
Immature Granulocytes: 0 %
Lymphocytes Relative: 19 %
Lymphs Abs: 1 10*3/uL (ref 0.7–4.0)
MCH: 30.7 pg (ref 26.0–34.0)
MCHC: 32.8 g/dL (ref 30.0–36.0)
MCV: 93.6 fL (ref 80.0–100.0)
Monocytes Absolute: 0.4 10*3/uL (ref 0.1–1.0)
Monocytes Relative: 7 %
Neutro Abs: 3.9 10*3/uL (ref 1.7–7.7)
Neutrophils Relative %: 72 %
Platelet Count: 202 10*3/uL (ref 150–400)
RBC: 4.04 MIL/uL (ref 3.87–5.11)
RDW: 11.9 % (ref 11.5–15.5)
WBC Count: 5.4 10*3/uL (ref 4.0–10.5)
nRBC: 0 % (ref 0.0–0.2)

## 2023-04-28 DIAGNOSIS — H40013 Open angle with borderline findings, low risk, bilateral: Secondary | ICD-10-CM | POA: Diagnosis not present

## 2023-04-28 DIAGNOSIS — H524 Presbyopia: Secondary | ICD-10-CM | POA: Diagnosis not present

## 2023-04-28 DIAGNOSIS — E119 Type 2 diabetes mellitus without complications: Secondary | ICD-10-CM | POA: Diagnosis not present

## 2023-04-28 LAB — HM DIABETES EYE EXAM

## 2023-05-22 DIAGNOSIS — E1142 Type 2 diabetes mellitus with diabetic polyneuropathy: Secondary | ICD-10-CM | POA: Diagnosis not present

## 2023-05-22 DIAGNOSIS — I10 Essential (primary) hypertension: Secondary | ICD-10-CM | POA: Diagnosis not present

## 2023-05-25 DIAGNOSIS — I10 Essential (primary) hypertension: Secondary | ICD-10-CM | POA: Diagnosis not present

## 2023-05-25 DIAGNOSIS — E1142 Type 2 diabetes mellitus with diabetic polyneuropathy: Secondary | ICD-10-CM | POA: Diagnosis not present

## 2023-06-08 ENCOUNTER — Ambulatory Visit: Payer: Medicare Other | Admitting: Cardiovascular Disease

## 2023-06-11 ENCOUNTER — Ambulatory Visit: Payer: Self-pay | Admitting: Internal Medicine

## 2023-06-11 ENCOUNTER — Encounter: Payer: Self-pay | Admitting: Internal Medicine

## 2023-06-11 VITALS — BP 142/70 | HR 69 | Temp 98.4°F | Ht 62.0 in | Wt 128.4 lb

## 2023-06-11 DIAGNOSIS — E1151 Type 2 diabetes mellitus with diabetic peripheral angiopathy without gangrene: Secondary | ICD-10-CM

## 2023-06-11 DIAGNOSIS — R5383 Other fatigue: Secondary | ICD-10-CM

## 2023-06-11 DIAGNOSIS — L910 Hypertrophic scar: Secondary | ICD-10-CM

## 2023-06-11 DIAGNOSIS — I119 Hypertensive heart disease without heart failure: Secondary | ICD-10-CM

## 2023-06-11 DIAGNOSIS — K219 Gastro-esophageal reflux disease without esophagitis: Secondary | ICD-10-CM | POA: Diagnosis not present

## 2023-06-11 DIAGNOSIS — I25119 Atherosclerotic heart disease of native coronary artery with unspecified angina pectoris: Secondary | ICD-10-CM | POA: Diagnosis not present

## 2023-06-11 MED ORDER — PANTOPRAZOLE SODIUM 40 MG PO TBEC
40.0000 mg | DELAYED_RELEASE_TABLET | Freq: Every day | ORAL | 2 refills | Status: DC
Start: 2023-06-11 — End: 2023-07-01

## 2023-06-11 NOTE — Progress Notes (Signed)
 I,Victoria T Deloria Lair, CMA,acting as a Neurosurgeon for Gwynneth Aliment, MD.,have documented all relevant documentation on the behalf of Gwynneth Aliment, MD,as directed by  Gwynneth Aliment, MD while in the presence of Gwynneth Aliment, MD.  Subjective:  Patient ID: Elizabeth Mcfarland , female    DOB: 03/01/1941 , 83 y.o.   MRN: [id number]  Chief Complaint  Patient presents with   Diabetes   Hypertension    HPI  Patient presents today for bp & dm follow up. She reports compliance with medications. Denies headache, chest pain, and SOB. She has no other concerns or complaints at this time.  She reports the cost of Januvia is too high. $300 copay. She would like an alternative. She complains of acid reflux. She wants to know what she can do for this issue. She has changed her eating habits.  She is also concerned about a reddish area on the left breast. She does experience pain as well.     Diabetes She presents for her follow-up diabetic visit. She has type 2 diabetes mellitus. Her disease course has been stable. There are no hypoglycemic associated symptoms. Pertinent negatives for hypoglycemia include no dizziness or headaches. Pertinent negatives for diabetes include no chest pain, no polydipsia, no polyphagia and no polyuria. There are no hypoglycemic complications. Symptoms are stable. Diabetic complications include heart disease. Risk factors for coronary artery disease include hypertension, sedentary lifestyle, dyslipidemia and diabetes mellitus. Current diabetic treatment includes oral agent (dual therapy). She is compliant with treatment all of the time. Her weight is stable. She is following a generally healthy diet. When asked about meal planning, she reported none. She participates in exercise daily (gardening). (Blood sugars was 210 last night, she took her glyburide at bedtime and her blood sugar this morning was 92 after taking a 1/2 tablet) An ACE inhibitor/angiotensin II receptor blocker is  being taken. Eye exam is current.  Hypertension This is a chronic problem. The current episode started more than 1 year ago. The problem is unchanged. Pertinent negatives include no chest pain, headaches or palpitations. Past treatments include ACE inhibitors. The current treatment provides significant improvement.     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   Cataract    Chronic kidney disease    Diabetes mellitus without complication (HCC)    Esophageal ulcer with bleeding 05/05/2013   05/05/2013 EGD linear distal esophageal ulcer - cause of hematemesis prior to EGD    GERD (gastroesophageal reflux disease)    History of kidney stones    Hyperlipidemia    Hypertension    Kidney cysts    Personal history of radiation therapy    Sarcoidosis 1970     Family History  Problem Relation Age of Onset   Early death Mother    Stroke Father    Diabetes Sister    Kidney failure Sister    Colon polyps Sister    Stroke Brother    Colon polyps Brother    Cancer Brother 71       prostate cancer   Heart disease Brother    Emphysema Brother    Cancer Brother 62       prostate cancer   Asthma Brother    CAD Maternal Grandmother    Breast cancer Neg Hx    Colon cancer Neg  Hx    Esophageal cancer Neg Hx    Rectal cancer Neg Hx    Stomach cancer Neg Hx      Current Outpatient Medications:    acetaminophen (TYLENOL) 500 MG tablet, Take 1,000 mg by mouth every 6 (six) hours as needed for mild pain or moderate pain., Disp: , Rfl:    amLODipine (NORVASC) 10 MG tablet, TAKE 1 TABLET(10 MG) BY MOUTH DAILY, Disp: 90 tablet, Rfl: 1   anastrozole (ARIMIDEX) 1 MG tablet, TAKE 1 TABLET(1 MG) BY MOUTH DAILY, Disp: 30 tablet, Rfl: 6   Ascorbic Acid (VITAMIN C) 1000 MG tablet, Take 1,000 mg by mouth daily., Disp: , Rfl:    aspirin EC 81 MG tablet, Take 81 mg  by mouth daily., Disp: , Rfl:    atorvastatin (LIPITOR) 80 MG tablet, Take 1 tablet (80 mg total) by mouth daily., Disp: 90 tablet, Rfl: 3   cholecalciferol (VITAMIN D) 1000 UNITS tablet, Take 1,000 Units by mouth daily., Disp: , Rfl:    glimepiride (AMARYL) 4 MG tablet, TAKE 1 TABLET BY MOUTH EVERY MORNING, Disp: 90 tablet, Rfl: 1   Multiple Vitamins-Minerals (CENTRUM SILVER 50+WOMEN PO), Take 1 tablet by mouth daily., Disp: , Rfl:    olmesartan (BENICAR) 40 MG tablet, TAKE 1 TABLET(40 MG) BY MOUTH DAILY, Disp: 90 tablet, Rfl: 1   ondansetron (ZOFRAN) 4 MG tablet, Take 1 tablet (4 mg total) by mouth every 6 (six) hours., Disp: 12 tablet, Rfl: 0   ONETOUCH ULTRA test strip, USE AS DIRECTED TWICE DAILY, Disp: 100 strip, Rfl: 3   Polyethyl Glycol-Propyl Glycol (SYSTANE OP), Place 1 drop into both eyes 2 (two) times daily., Disp: , Rfl:    polyethylene glycol (MIRALAX / GLYCOLAX) packet, Take 17 g by mouth daily. , Disp: , Rfl:    sitaGLIPtin (JANUVIA) 100 MG tablet, TAKE 1 TABLET ( 100MG ) BY MOUTH DAILY., Disp: 90 tablet, Rfl: 2   chlorthalidone (HYGROTON) 25 MG tablet, TAKE 1/2 TABLET BY MOUTH EVERY DAY (Patient not taking: Reported on 03/16/2023), Disp: 30 tablet, Rfl: 1   metoprolol succinate (TOPROL-XL) 25 MG 24 hr tablet, TAKE ONE-HALF OF A TABLET BY MOUTH DAILY (Patient not taking: Reported on 06/11/2023), Disp: 45 tablet, Rfl: 3   nitroGLYCERIN (NITROSTAT) 0.4 MG SL tablet, Place 1 tablet (0.4 mg total) under the tongue every 5 (five) minutes as needed for chest pain., Disp: 25 tablet, Rfl: 1   pantoprazole (PROTONIX) 40 MG tablet, Take 1 tablet (40 mg total) by mouth daily., Disp: 90 tablet, Rfl: 2   Allergies  Allergen Reactions   Codeine Nausea And Vomiting   Percocet [Oxycodone-Acetaminophen] Nausea And Vomiting     Review of Systems  Constitutional: Negative.   Respiratory: Negative.    Cardiovascular: Negative.  Negative for chest pain and palpitations.  Gastrointestinal:  Negative.        She c/o indigestion, sx started about a month ago.  Tums has not been effective. She has not tried any other medication.  No associated abdominal pain, nausea/vomiting.   Endocrine: Negative for polydipsia, polyphagia and polyuria.  Neurological: Negative.  Negative for dizziness and headaches.  Psychiatric/Behavioral: Negative.       Today's Vitals   06/11/23 1422 06/11/23 1452  BP: (!) 140/70 (!) 142/70  Pulse: 69   Temp: 98.4 F (36.9 C)   SpO2: 98%   Weight: 128 lb 6.4 oz (58.2 kg)   Height: 5\' 2"  (1.575 m)    Body mass index is 23.48 kg/m.  Wt Readings from Last 3 Encounters:  06/11/23 128 lb 6.4 oz (58.2 kg)  04/27/23 128 lb 6.4 oz (58.2 kg)  03/16/23 124 lb (56.2 kg)     Objective:  Physical Exam Vitals and nursing note reviewed.  Constitutional:      Appearance: Normal appearance.  HENT:     Head: Normocephalic and atraumatic.  Eyes:     Extraocular Movements: Extraocular movements intact.  Cardiovascular:     Rate and Rhythm: Normal rate and regular rhythm.     Heart sounds: Normal heart sounds.  Pulmonary:     Effort: Pulmonary effort is normal.     Breath sounds: Normal breath sounds.  Musculoskeletal:     Cervical back: Normal range of motion.  Skin:    General: Skin is warm.     Comments: Keloids, no overlying erythema  Neurological:     General: No focal deficit present.     Mental Status: She is alert.  Psychiatric:        Mood and Affect: Mood normal.        Behavior: Behavior normal.         Assessment And Plan:  Type 2 diabetes mellitus with diabetic peripheral angiopathy without gangrene, without long-term current use of insulin (HCC) Assessment & Plan: Chronic, she will c/w Januvia 100mg  and glimepriride 4mg  daily. She is encouraged to follow dietary recommendations. She agrees to let me know if her sugars are consistently below 80. She will f/u in 3-4 months.   Orders: -     AMB Referral [facility] Care Management -      Hemoglobin A1c  Hypertensive heart disease without heart failure Assessment & Plan: Chronic, uncontrolled. For now, she will continue with amlodipine 10mg  daily, olmesartan 40mg  daily,  and metoprolol Xl 25mg  1/2 tab daily.  She is encouraged to follow low sodium diet. No med changes today. Reminded to avoid packaged foods and processed meats which tend to be high in sodium.   Orders: -     AMB Referral [facility]BCI Care Management  Coronary artery disease involving native coronary artery of native heart with angina pectoris [facility]) Assessment & Plan: Chronic, status post stent placement in 2023.  She will c/w ASA, B-blocker and statin therapy. She is encouraged to follow a heart healthy diet.   Orders: -     AMB Referral [facility]I Care Management  Gastroesophageal reflux disease without esophagitis Assessment & Plan: Chronic, currently sx are stable on pantoprazole. Will consider changing to every other day dosing in the future OR down-titrating the dose due to risk of worsening kidney/bone health with long-term use. She is reminded to stop eating 3 hrs prior to lying down.    Keloid scar of skin Assessment & Plan: She states the area on her chest has become more bothersome recently. It itches constantly. She agrees to Plastics referral for further evaluation. She is aware treatment may include steroid injections.   Orders: -     Ambulatory referral to Plastic Surgery  Other fatigue Assessment & Plan: She states she feels fatigued often. Denies change in her appetite/sleep habits. I will check vitamin B12 level. She is on PPI therapy which can cause malabsorption of vitamin B12. I will supplement as needed.   Orders: -     Vitamin B12  Other orders -     Pantoprazole Sodium; Take 1 tablet (40 mg total) by mouth daily.  Dispense: 90 tablet; Refill: 2     Return in 4 weeks (on 07/09/2023),  or pantoprazole f/u.  Patient was given opportunity to ask questions. Patient verbalized understanding  of the plan and was able to repeat key elements of the plan. All questions were answered to their satisfaction.    I, Gwynneth Aliment, MD, have reviewed all documentation for this visit. The documentation on 06/11/23 for the exam, diagnosis, procedures, and orders are all accurate and complete.   IF YOU HAVE BEEN REFERRED TO A SPECIALIST, IT MAY TAKE 1-2 WEEKS TO SCHEDULE/PROCESS THE REFERRAL. IF YOU HAVE NOT HEARD FROM US/SPECIALIST IN TWO WEEKS, PLEASE GIVE [place] A CALL AT [phone].   THE PATIENT IS ENCOURAGED TO PRACTICE SOCIAL DISTANCING DUE TO THE COVID-19 PANDEMIC.

## 2023-06-11 NOTE — Patient Instructions (Signed)

## 2023-06-12 ENCOUNTER — Telehealth: Payer: Self-pay

## 2023-06-12 LAB — VITAMIN B12: Vitamin B-12: >2000 pg/mL — ABNORMAL HIGH (ref 232–1245)

## 2023-06-12 LAB — HEMOGLOBIN A1C
Est. average glucose Bld gHb Est-mCnc: 146 mg/dL
Hgb A1c MFr Bld: 6.7 % — ABNORMAL HIGH (ref 4.8–5.6)

## 2023-06-12 NOTE — Progress Notes (Signed)
   06/12/2023  Patient ID: Elizabeth Mcfarland, female   DOB: 17-Jul-1940, 83 y.o.   MRN: [id number]  Called patient to discuss medication assistance referral. The patient filled and picked up Jan[facility]ia 100mg  from [facility]  on 05/12[facility]23 for a 90 day supply.   Informed the patient that embedded pharmacist will be following up with her next week for complete medication assistance evaluation.   Harlon Flor, PharmD Clinical Pharmacist  [phone]

## 2023-06-15 DIAGNOSIS — R5383 Other fatigue: Secondary | ICD-10-CM | POA: Insufficient documentation

## 2023-06-15 DIAGNOSIS — L910 Hypertrophic scar: Secondary | ICD-10-CM | POA: Insufficient documentation

## 2023-06-15 NOTE — Assessment & Plan Note (Signed)
 She states she feels fatigued often. Denies change in her appetite/sleep habits. I will check vitamin B12 level. She is on PPI therapy which can cause malabsorption of vitamin B12. I will supplement as needed.

## 2023-06-15 NOTE — Assessment & Plan Note (Signed)
 She states the area on her chest has become more bothersome recently. It itches constantly. She agrees to Plastics referral for further evaluation. She is aware treatment may include steroid injections.

## 2023-06-15 NOTE — Assessment & Plan Note (Addendum)
 Chronic, she will c/w Januvia 100mg  and glimepriride 4mg  daily. She is encouraged to follow dietary recommendations. She agrees to let me know if her sugars are consistently below 80. She will f/u in 3-4 months.

## 2023-06-15 NOTE — Assessment & Plan Note (Signed)
 Chronic, uncontrolled. For now, she will continue with amlodipine 10mg  daily, olmesartan 40mg  daily,  and metoprolol Xl 25mg  1/2 tab daily.  She is encouraged to follow low sodium diet. No med changes today. Reminded to avoid packaged foods and processed meats which tend to be high in sodium.

## 2023-06-15 NOTE — Assessment & Plan Note (Signed)
 Chronic, status post stent placement in 2023.  She will c/w ASA, B-blocker and statin therapy. She is encouraged to follow a heart healthy diet.

## 2023-06-15 NOTE — Assessment & Plan Note (Signed)
 Chronic, currently sx are stable on pantoprazole. Will consider changing to every other day dosing in the future OR down-titrating the dose due to risk of worsening kidney/bone health with long-term use. She is reminded to stop eating 3 hrs prior to lying down.

## 2023-06-16 ENCOUNTER — Other Ambulatory Visit: Payer: Self-pay | Admitting: Internal Medicine

## 2023-06-16 ENCOUNTER — Telehealth: Payer: Self-pay | Admitting: Pharmacist

## 2023-06-16 ENCOUNTER — Telehealth: Payer: Self-pay

## 2023-06-16 DIAGNOSIS — E1151 Type 2 diabetes mellitus with diabetic peripheral angiopathy without gangrene: Secondary | ICD-10-CM

## 2023-06-16 NOTE — Progress Notes (Signed)
 06/16/2023 Name: Elizabeth Mcfarland MRN: [id number] DOB: 1940/09/11  Chief Complaint  Patient presents with   Medication Assistance    Januvia    [name] is a 83 y.o. year old female who presented for a telephone visit.   They were referred to the pharmacist by their PCP for assistance in managing medication access.    Subjective:  Care Team: Primary Care Provider: Dorothyann Peng, MD ; Next Scheduled Visit: 07/09/2023  Medication Access/Adherence  Current Pharmacy:  [facility] [address] [place] [address] Phone: [phone] Fax: [phone]  [id number] [place], [place] - [address] [place] [address] Phone: [phone] Fax: [phone]  [id number] [place], [place] - [address] AT [facility] [address] [place] [address] Phone: [phone] Fax: [phone]   Patient reports affordability concerns with their medications: Yes -Januvia Patient reports access/transportation concerns to their pharmacy: No  Patient reports adherence concerns with their medications:  No     Patient reported paying $300 for Januvia for a three month supply in February.   Diabetes:  Current medications:  Glimepiride 4 mg 1 tablet daily Januvia 100 mg 1 tablet daily  Current glucose readings: 94 mg/dl   Patient denies hypoglycemic s/sx including  dizziness, shakiness, sweating. Patient denies hyperglycemic symptoms including  polyuria, polydipsia, polyphagia, nocturia, neuropathy, blurred vision.  Current meal patterns:  - Breakfast: cereal, fruit, bacon, scrambled egg, coffee, tea - Lunch peanut butter crackers, piece of fruit, tuna salad - Supper brussels sprouts, chicken wings, salad - Snacks wheat peanut butter crackers,  - Drinks coffee, tea, water  Current physical activity: walking, gardening, mows her own lawn  Current  medication access support: currently investigating [employer] for [employer]  Hypertension:  Current medications:   Amlodipine 10 mg 1 tablet daily Olmesartan 40 mg  1 tablet daily     Patient has a validated, automated, upper arm home BP cuff Current blood pressure readings readings: 128/68 Pulse 65  Patient denies hypotensive s/sx including  dizziness, lightheadedness.  Patient denies hypertensive symptoms including  headache, chest pain, shortness of breath   Hyperlipidemia/ASCVD Risk Reduction  Current lipid lowering medications:  Atorvastatin 80 mg 1 tablet daily  Antiplatelet regimen:  ASA 81 mg 1 tablet daily   Objective:  Lab Results  Component Value Date   HGBA1C 6.7 (H) 06/11/2023    Lab Results  Component Value Date   CREATININE 0.76 04/27/2023   BUN 12 04/27/2023   NA 138 04/27/2023   K 3.7 04/27/2023   CL 104 04/27/2023   CO2 31 04/27/2023    Lab Results  Component Value Date   CHOL 178 08/28/2022   HDL 102 08/28/2022   LDLCALC 67 08/28/2022   TRIG 44 08/28/2022   CHOLHDL 1.7 08/28/2022    Medications Reviewed Today     Reviewed by Beecher Mcardle, RPH (Pharmacist) on 06/16/23 at 0847  Med List Status: <None>   Medication Order Taking? Sig Documenting Provider Last Dose Status Informant  acetaminophen (TYLENOL) 500 MG tablet [id number] Yes Take 1,000 mg by mouth every 6 (six) hours as needed for mild pain or moderate pain. [provider] Taking Active Self  amLODipine (NORVASC) 10 MG tablet [id number] Yes TAKE 1 TABLET(10 MG) BY MOUTH DAILY [name], FNP Taking Active   anastrozole (ARIMIDEX) 1 MG tablet [id number] Yes TAKE 1 TABLET(1 MG) BY MOUTH  DAILY Pollyann Samples, NP Taking Active   Ascorbic Acid (VITAMIN C) 1000 MG tablet [id number] Yes Take 1,000 mg by mouth daily. [provider] Taking Active Self  aspirin EC 81 MG tablet [id number] Yes Take 81 mg by mouth daily. [provider] Taking Active Self  atorvastatin  (LIPITOR) 80 MG tablet [id number] Yes Take 1 tablet (80 mg total) by mouth daily. Dorothyann Peng, MD Taking Active   cholecalciferol (VITAMIN D) 1000 UNITS tablet [id number] Yes Take 1,000 Units by mouth daily. [provider] Taking Active Self  dorzolamide-timolol (COSOPT) 2-0.5 % ophthalmic solution [id number] Yes Place 1 drop into both eyes 2 (two) times daily. [provider] Taking Active   glimepiride (AMARYL) 4 MG tablet [id number] Yes TAKE 1 TABLET BY MOUTH EVERY MORNING Dorothyann Peng, MD Taking Active   Multiple Vitamins-Minerals (CENTRUM SILVER 50+WOMEN PO) [id number] Yes Take 1 tablet by mouth daily. [provider] Taking Active   nitroGLYCERIN (NITROSTAT) 0.4 MG SL tablet [id number]  Place 1 tablet (0.4 mg total) under the tongue every 5 (five) minutes as needed for chest pain. Jonita Albee, [place]  Expired 10/23/22 2359 Self           Med Note Dione Housekeeper, EBONY J   Fri Aug 30, 2021  9:44 AM) Patient has on hand if needed  olmesartan (BENICAR) 40 MG tablet [id number] Yes TAKE 1 TABLET(40 MG) BY MOUTH DAILY [name], FNP Taking Active   ondansetron (ZOFRAN) 4 MG tablet [id number] Yes Take 1 tablet (4 mg total) by mouth every 6 (six) hours. Gareth Eagle, PA-C Taking Active   [facility] ULTRA test strip [id number] Yes USE AS DIRECTED TWICE DAILY [name], FNP Taking Active Self  pantoprazole (PROTONIX) 40 MG tablet [id number] Yes Take 1 tablet (40 mg total) by mouth daily. Dorothyann Peng, MD Taking Active   Polyethyl Glycol-Propyl Glycol [facility] OP) [id number] Yes Place 1 drop into both eyes 2 (two) times daily. [provider] Taking Active Self  polyethylene glycol (MIRALAX / GLYCOLAX) packet [id number] Yes Take 17 g by mouth daily.  [provider] Taking Active Self  sitaGLIPtin (JANUVIA) 100 MG tablet [id number] Yes TAKE 1 TABLET ( 100MG ) BY MOUTH DAILY. Dorothyann Peng, MD Taking Active               Assessment/Plan:    Diabetes: - Currently controlled 6.7% - Recommend to continue current therapy--Patient is very active. She is 83 years old so her goal could be a little higher.  She did not report any hypoglycemia.  Glimepiride dose could be decreased or even a trial d/c  -Meets financial criteria for Januvia patient assistance program through [employer]. Will collaborate with provider, CPhT, and patient to pursue assistance.   Hypertension: - Currently controlled - Recommend to continue current therapy   Hyperlipidemia/ASCVD Risk Reduction: - Currently controlled.  - Recommend to continue current therapy    Follow Up Plan:    Route note to Medication Assistance Team. Route note to PCP.  Beecher Mcardle, PharmD, BCACP Clinical Pharmacist [phone]

## 2023-06-16 NOTE — Telephone Encounter (Signed)
 PAP: Patient assistance application for januvia through [employer] has been mailed to pt's home address on file. Provider portion of application will be faxed to provider's office.

## 2023-06-22 DIAGNOSIS — I10 Essential (primary) hypertension: Secondary | ICD-10-CM | POA: Diagnosis not present

## 2023-06-22 DIAGNOSIS — E1142 Type 2 diabetes mellitus with diabetic polyneuropathy: Secondary | ICD-10-CM | POA: Diagnosis not present

## 2023-06-24 ENCOUNTER — Other Ambulatory Visit (HOSPITAL_COMMUNITY): Payer: Self-pay

## 2023-06-24 ENCOUNTER — Ambulatory Visit: Payer: Medicare Other

## 2023-06-24 ENCOUNTER — Other Ambulatory Visit: Payer: Self-pay

## 2023-06-24 DIAGNOSIS — E1142 Type 2 diabetes mellitus with diabetic polyneuropathy: Secondary | ICD-10-CM | POA: Diagnosis not present

## 2023-06-24 DIAGNOSIS — I10 Essential (primary) hypertension: Secondary | ICD-10-CM | POA: Diagnosis not present

## 2023-06-24 DIAGNOSIS — Z0000 Encounter for general adult medical examination without abnormal findings: Secondary | ICD-10-CM

## 2023-06-24 MED ORDER — NITROGLYCERIN 0.4 MG SL SUBL
0.4000 mg | SUBLINGUAL_TABLET | SUBLINGUAL | 1 refills | Status: AC | PRN
  Filled 2023-06-24: qty 25, 1d supply, fill #0

## 2023-06-24 NOTE — Progress Notes (Signed)
 Subjective:   Elizabeth Mcfarland is a 83 y.o. who presents for a Medicare Wellness preventive visit.  Visit Complete: Virtual I connected with  Elizabeth Mcfarland on 06/24/23 by a audio enabled telemedicine application and verified that I am speaking with the correct person using two identifiers.  Patient Location: Home  Provider Location: Office/Clinic  I discussed the limitations of evaluation and management by telemedicine. The patient expressed understanding and agreed to proceed.  Vital Signs: Because this visit was a virtual/telehealth visit, some criteria may be missing or patient reported. Any vitals not documented were not able to be obtained and vitals that have been documented are patient reported.  VideoError- Librarian, academic were attempted between this provider and patient, however failed, due to patient having technical difficulties OR patient did not have access to video capability.  We continued and completed visit with audio only.   Persons Participating in Visit: Patient.  AWV Questionnaire: No: Patient Medicare AWV questionnaire was not completed prior to this visit.  Cardiac Risk Factors include: advanced age (>66men, >36 women);diabetes mellitus;hypertension     Objective:    Today's Vitals   There is no height or weight on file to calculate BMI.     06/24/2023   12:24 PM 04/27/2023    9:41 AM 03/17/2023    5:49 AM 06/19/2022    9:46 AM 06/12/2021   10:28 AM 04/08/2021    7:19 AM 08/02/2020    2:08 PM  Advanced Directives  Does Patient Have a Medical Advance Directive? No No No No No No No  Would patient like information on creating a medical advance directive?  No - Patient declined   Yes (MAU/Ambulatory/Procedural Areas - Information given) No - Patient declined     Current Medications (verified) Outpatient Encounter Medications as of 06/24/2023  Medication Sig   acetaminophen (TYLENOL) 500 MG tablet Take 1,000 mg by mouth every 6  (six) hours as needed for mild pain or moderate pain.   amLODipine (NORVASC) 10 MG tablet TAKE 1 TABLET(10 MG) BY MOUTH DAILY   anastrozole (ARIMIDEX) 1 MG tablet TAKE 1 TABLET(1 MG) BY MOUTH DAILY   Ascorbic Acid (VITAMIN C) 1000 MG tablet Take 1,000 mg by mouth daily.   aspirin EC 81 MG tablet Take 81 mg by mouth daily.   atorvastatin (LIPITOR) 80 MG tablet Take 1 tablet (80 mg total) by mouth daily.   cholecalciferol (VITAMIN D) 1000 UNITS tablet Take 1,000 Units by mouth daily.   dorzolamide-timolol (COSOPT) 2-0.5 % ophthalmic solution Place 1 drop into both eyes 2 (two) times daily.   glimepiride (AMARYL) 4 MG tablet TAKE 1 TABLET BY MOUTH EVERY MORNING   Multiple Vitamins-Minerals (CENTRUM SILVER 50+WOMEN PO) Take 1 tablet by mouth daily.   olmesartan (BENICAR) 40 MG tablet TAKE 1 TABLET(40 MG) BY MOUTH DAILY   ondansetron (ZOFRAN) 4 MG tablet Take 1 tablet (4 mg total) by mouth every 6 (six) hours.   ONETOUCH ULTRA test strip USE AS DIRECTED TWICE DAILY   pantoprazole (PROTONIX) 40 MG tablet Take 1 tablet (40 mg total) by mouth daily.   Polyethyl Glycol-Propyl Glycol (SYSTANE OP) Place 1 drop into both eyes 2 (two) times daily.   polyethylene glycol (MIRALAX / GLYCOLAX) packet Take 17 g by mouth daily.    sitaGLIPtin (JANUVIA) 100 MG tablet TAKE 1 TABLET ( 100MG ) BY MOUTH DAILY.   nitroGLYCERIN (NITROSTAT) 0.4 MG SL tablet Place 1 tablet (0.4 mg total) under the tongue every 5 (  five) minutes as needed for chest pain.   No facility-administered encounter medications on file as of 06/24/2023.    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   Cataract    Chronic kidney disease    Diabetes mellitus without complication (HCC)    Esophageal ulcer with bleeding 05/05/2013    05/05/2013 EGD linear distal esophageal ulcer - cause of hematemesis prior to EGD    GERD (gastroesophageal reflux disease)    History of kidney stones    Hyperlipidemia    Hypertension    Kidney cysts    Personal history of radiation therapy    Sarcoidosis 1970   Past Surgical History:  Procedure Laterality Date   ABDOMINAL HYSTERECTOMY     BREAST BIOPSY Left 07/09/2021   BREAST EXCISIONAL BIOPSY Left 11/2018   Fibroadenoma   BREAST LUMPECTOMY     BREAST LUMPECTOMY WITH RADIOACTIVE SEED AND SENTINEL LYMPH NODE BIOPSY Bilateral 11/25/2018   Procedure: BILATERAL BREAST LUMPECTOMIES  WITH BILATERAL  RADIOACTIVE SEEDS AND RIGHT SENTINEL LYMPH NODE BIOPSY;  Surgeon: Griselda Miner, MD;  Location: [facility] OR;  Service: General;  Laterality: Bilateral;   CATARACT EXTRACTION W/ INTRAOCULAR LENS IMPLANT     COLONOSCOPY     COLONOSCOPY, ESOPHAGOGASTRODUODENOSCOPY (EGD) AND ESOPHAGEAL DILATION N/A 05/05/2013   Procedure: COLONOSCOPY, ESOPHAGOGASTRODUODENOSCOPY (EGD) AND ESOPHAGEAL DILATION (ED);  Surgeon: Iva Boop, MD;  Location: [facility] ENDOSCOPY;  Service: Endoscopy;  Laterality: N/A;   LEFT HEART CATH AND CORONARY ANGIOGRAPHY N/A 04/08/2021   Procedure: LEFT HEART CATH AND CORONARY ANGIOGRAPHY;  Surgeon: Tonny Bollman, MD;  Location: [facility] INVASIVE CV LAB;  Service: Cardiovascular;  Laterality: N/A;   LITHOTRIPSY     POLYPECTOMY     UPPER GASTROINTESTINAL ENDOSCOPY     Family History  Problem Relation Age of Onset   Early death Mother    Stroke Father    Diabetes Sister    Kidney failure Sister    Colon polyps Sister    Stroke Brother    Colon polyps Brother    Cancer Brother 4       prostate cancer   Heart disease Brother    Emphysema Brother    Cancer Brother 65       prostate cancer   Asthma Brother    CAD Maternal Grandmother    Breast cancer Neg Hx    Colon cancer Neg Hx    Esophageal cancer Neg Hx    Rectal cancer Neg Hx    Stomach cancer Neg Hx    Social History    Socioeconomic History   Marital status: Single    Spouse name: Not on file   Number of children: 1   Years of education: Not on file   Highest education level: GED or equivalent  Occupational History   Occupation: Conservator, museum/gallery: SELF-EMPLOYED   Occupation: retired  Tobacco Use   Smoking status: Never   Smokeless tobacco: Never  Vaping Use   Vaping status: Never Used  Substance and Sexual Activity   Alcohol use: No   Drug use: No   Sexual activity: Not Currently  Other Topics Concern   Not on file  Social History Narrative   Lives alone   Social Drivers of Health   Financial Resource Strain: Low Risk  (  06/24/2023)   Overall Financial Resource Strain (CARDIA)    Difficulty of Paying Living Expenses: Not hard at all  Food Insecurity: No Food Insecurity (06/24/2023)   Hunger Vital Sign    Worried About Running Out of Food in the Last Year: Never true    Ran Out of Food in the Last Year: Never true  Transportation Needs: No Transportation Needs (06/24/2023)   PRAPARE - Administrator, Civil Service (Medical): No    Lack of Transportation (Non-Medical): No  Physical Activity: Inactive (06/24/2023)   Exercise Vital Sign    Days of Exercise per Week: 0 days    Minutes of Exercise per Session: 0 min  Stress: No Stress Concern Present (06/24/2023)   [employer] of Occupational Health - Occupational Stress Questionnaire    Feeling of Stress : Not at all  Social Connections: Moderately Integrated (06/24/2023)   Social Connection and Isolation Panel [NHANES]    Frequency of Communication with Friends and Family: More than three times a week    Frequency of Social Gatherings with Friends and Family: More than three times a week    Attends Religious Services: More than 4 times per year    Active Member of [employer] or Organizations: Yes    Attends Engineer, structural: More than 4 times per year    Marital Status: Divorced    Tobacco Counseling Counseling  given: Not Answered    Clinical Intake:  Pre-visit preparation completed: Yes  Pain : No/denies pain     Nutritional Risks: None Diabetes: Yes CBG done?: No Did pt. bring in CBG monitor from home?: No  Lab Results  Component Value Date   HGBA1C 6.7 (H) 06/11/2023   HGBA1C 6.6 (H) 03/12/2023   HGBA1C 6.5 (H) 08/28/2022     How often do you need to have someone help you when you read instructions, pamphlets, or other written materials from your doctor or pharmacy?: 1 - Never  Interpreter Needed?: No  Information entered by :: NAllen LPN   Activities of Daily Living     06/24/2023   12:17 PM  In your present state of health, do you have any difficulty performing the following activities:  Hearing? 0  Vision? 0  Difficulty concentrating or making decisions? 1  Walking or climbing stairs? 0  Dressing or bathing? 0  Doing errands, shopping? 0  Preparing Food and eating ? N  Using the Toilet? N  In the past six months, have you accidently leaked urine? N  Do you have problems with loss of bowel control? N  Managing your Medications? N  Managing your Finances? N  Housekeeping or managing your Housekeeping? N    Patient Care Team: Dorothyann Peng, MD as PCP - General (Internal Medicine) [name], Rachelle Hora, MD as PCP - Cardiology (Cardiology) Pershing Proud, RN as Oncology Nurse Navigator Donnelly Angelica, RN as Oncology Nurse Navigator Malachy Mood, MD as Consulting Physician (Hematology) Griselda Miner, MD as Consulting Physician (General Surgery) Antony Blackbird, MD as Consulting Physician (Radiation Oncology) Pollyann Samples, NP as Nurse Practitioner (Nurse Practitioner) Harlan Stains, RPH (Inactive) (Pharmacist) [place], Heather, OD (Optometry)  Indicate any recent Medical Services you may have received from other than [facility] providers in the past year (date may be approximate).     Assessment:   This is a routine wellness examination for [facility].  Hearing/Vision  screen Hearing Screening - Comments:: Denies hearing issues Vision Screening - Comments:: Regular eye exams, [facility]   Goals Addressed             This Visit's Progress    Patient Stated       06/24/2023, wants to eat healthier and exercise       Depression Screen     06/24/2023   12:26 PM 10/30/2022    9:53 AM 08/28/2022    9:42 AM 06/19/2022    9:47 AM 11/27/2021    9:04 AM 09/02/2021   11:49 AM 06/25/2021    9:54 AM  PHQ 2/9 Scores  PHQ - 2 Score 0 0 0 0 0 0 0  PHQ- 9 Score 1 0 0    0    Fall Risk     06/24/2023   12:25 PM 10/30/2022    9:53 AM 08/28/2022    9:39 AM 06/19/2022    9:47 AM 11/27/2021    9:04 AM  Fall Risk   Falls in the past year? 0 0 0 0 1  Number falls in past yr: 0 0 0 0 0  Injury with Fall? 0 0 0 0 0  Risk for fall due to : Medication side effect No Fall Risks No Fall Risks Medication side effect History of fall(s)  Follow up Falls prevention discussed;Falls evaluation completed Falls evaluation completed Falls evaluation completed Falls prevention discussed;Education provided;Falls evaluation completed Falls evaluation completed    MEDICARE RISK AT HOME:  Medicare Risk at Home Any stairs in or around the home?: No If so, are there any without handrails?: No Home free of loose throw rugs in walkways, pet beds, electrical cords, etc?: Yes Adequate lighting in your home to reduce risk of falls?: Yes Life alert?: No Use of a cane, walker or w/c?: Yes Grab bars in the bathroom?: No Shower chair or bench in shower?: No Elevated toilet seat or a handicapped toilet?: Yes  TIMED UP AND GO:  Was the test performed?  No  Cognitive Function: 6CIT completed        06/24/2023   12:27 PM 06/19/2022    9:49 AM 06/12/2021   10:32 AM 08/02/2020    2:14 PM 07/14/2019    9:17 AM  6CIT Screen  What Year? 0 points 0 points 0 points 0 points 0 points  What month? 0 points 0 points 0 points 0 points 0 points  What time? 0 points 0 points 0 points 0 points 0 points   Count back from 20 0 points 0 points 0 points 2 points 0 points  Months in reverse 2 points 0 points 0 points 0 points 0 points  Repeat phrase 0 points 0 points 6 points 0 points 2 points  Total Score 2 points 0 points 6 points 2 points 2 points    Immunizations Immunization History  Administered Date(s) Administered   Fluad Quad(high Dose 65+) 12/20/2019, 02/04/2022   Influenza, High Dose Seasonal PF 01/13/2018, 01/05/2019   Influenza-Unspecified 01/15/2021   PFIZER(Purple Top)SARS-COV-2 Vaccination 05/03/2019, 05/21/2019, 01/04/2020, 07/30/2020   Pfizer Covid-19 Vaccine Bivalent Booster 23yrs & up 01/01/2021   Pfizer(Comirnaty)Fall Seasonal Vaccine 12 years and older 12/11/2022   Pneumococcal Conjugate-13 04/11/2019   Pneumococcal Polysaccharide-23 09/11/2020   Td 03/24/2010   Tdap 06/12/2021   Zoster Recombinant(Shingrix) 08/27/2021, 10/29/2021    Screening Tests Health Maintenance  Topic Date Due   COVID-19 Vaccine (7 - Pfizer risk 2024-25 season) 06/10/2023   Diabetic kidney evaluation - Urine ACR  08/28/2023   FOOT EXAM  08/28/2023   INFLUENZA VACCINE  10/23/2023  HEMOGLOBIN A1C  12/12/2023   Diabetic kidney evaluation - eGFR measurement  04/26/2024   OPHTHALMOLOGY EXAM  04/27/2024   Medicare Annual Wellness (AWV)  06/23/2024   DTaP/Tdap/Td (3 - Td or Tdap) 06/13/2031   Pneumonia Vaccine 59+ Years old  Completed   DEXA SCAN  Completed   Zoster Vaccines- Shingrix  Completed   HPV VACCINES  Aged Out   Hepatitis C Screening  Discontinued    Health Maintenance  Health Maintenance Due  Topic Date Due   COVID-19 Vaccine (7 - Pfizer risk 2024-25 season) 06/10/2023   Health Maintenance Items Addressed: Up to date  Additional Screening:  Vision Screening: Recommended annual ophthalmology exams for early detection of glaucoma and other disorders of the eye.  Dental Screening: Recommended annual dental exams for proper oral hygiene  Community Resource Referral /  Chronic Care Management: CRR required this visit?  No   CCM required this visit?  No     Plan:     I have personally reviewed and noted the following in the patient's chart:   Medical and social history Use of alcohol, tobacco or illicit drugs  Current medications and supplements including opioid prescriptions. Patient is not currently taking opioid prescriptions. Functional ability and status Nutritional status Physical activity Advanced directives List of other physicians Hospitalizations, surgeries, and ER visits in previous 12 months Vitals Screenings to include cognitive, depression, and falls Referrals and appointments  In addition, I have reviewed and discussed with patient certain preventive protocols, quality metrics, and best practice recommendations. A written personalized care plan for preventive services as well as general preventive health recommendations were provided to patient.     Barb Merino, LPN   0/04/5425   After Visit Summary: (MyChart) Due to this being a telephonic visit, the after visit summary with patients personalized plan was offered to patient via MyChart   Notes: Nothing significant to report at this time.

## 2023-06-24 NOTE — Patient Instructions (Signed)
 Ms. Zamarripa , Thank you for taking time to come for your Medicare Wellness Visit. I appreciate your ongoing commitment to your health goals. Please review the following plan we discussed and let me know if I can assist you in the future.   Referrals/Orders/Follow-Ups/Clinician Recommendations: none  This is a list of the screening recommended for you and due dates:  Health Maintenance  Topic Date Due   COVID-19 Vaccine (7 - Pfizer risk 2024-25 season) 06/10/2023   Yearly kidney health urinalysis for diabetes  08/28/2023   Complete foot exam   08/28/2023   Flu Shot  10/23/2023   Hemoglobin A1C  12/12/2023   Yearly kidney function blood test for diabetes  04/26/2024   Eye exam for diabetics  04/27/2024   Medicare Annual Wellness Visit  06/23/2024   DTaP/Tdap/Td vaccine (3 - Td or Tdap) 06/13/2031   Pneumonia Vaccine  Completed   DEXA scan (bone density measurement)  Completed   Zoster (Shingles) Vaccine  Completed   HPV Vaccine  Aged Out   Hepatitis C Screening  Discontinued    Advanced directives: (ACP Link)Information on Advanced Care Planning can be found at [facility] [place] Advance Health Care Directives Advance Health Care Directives. [email or web address]   Next Medicare Annual Wellness Visit scheduled for next year: No, office will schedule  insert Preventive Care attachment Insert FALL PREVENTION attachment if needed

## 2023-06-26 NOTE — Telephone Encounter (Signed)
 Received Patient pages of application for PAP Januvia ([employer]) waiting on provider page.

## 2023-06-29 ENCOUNTER — Other Ambulatory Visit: Payer: Self-pay | Admitting: Internal Medicine

## 2023-06-29 ENCOUNTER — Ambulatory Visit: Payer: Medicare Other | Attending: Cardiovascular Disease | Admitting: Cardiovascular Disease

## 2023-06-29 ENCOUNTER — Encounter: Payer: Self-pay | Admitting: Cardiovascular Disease

## 2023-06-29 VITALS — BP 150/63 | HR 71 | Ht 62.0 in | Wt 125.6 lb

## 2023-06-29 DIAGNOSIS — I10 Essential (primary) hypertension: Secondary | ICD-10-CM

## 2023-06-29 DIAGNOSIS — Z853 Personal history of malignant neoplasm of breast: Secondary | ICD-10-CM | POA: Diagnosis not present

## 2023-06-29 DIAGNOSIS — E119 Type 2 diabetes mellitus without complications: Secondary | ICD-10-CM | POA: Diagnosis not present

## 2023-06-29 DIAGNOSIS — I4589 Other specified conduction disorders: Secondary | ICD-10-CM

## 2023-06-29 DIAGNOSIS — Z923 Personal history of irradiation: Secondary | ICD-10-CM | POA: Diagnosis not present

## 2023-06-29 DIAGNOSIS — E7800 Pure hypercholesterolemia, unspecified: Secondary | ICD-10-CM | POA: Diagnosis not present

## 2023-06-29 DIAGNOSIS — I2510 Atherosclerotic heart disease of native coronary artery without angina pectoris: Secondary | ICD-10-CM | POA: Diagnosis not present

## 2023-06-29 DIAGNOSIS — E1151 Type 2 diabetes mellitus with diabetic peripheral angiopathy without gangrene: Secondary | ICD-10-CM

## 2023-06-29 DIAGNOSIS — I700 Atherosclerosis of aorta: Secondary | ICD-10-CM

## 2023-06-29 NOTE — Progress Notes (Unsigned)
 Cardiology Office Note:    Date:  06/30/2023   ID:  Elizabeth Mcfarland, DOB 30-May-1940, MRN [id number]  PCP:  Dorothyann Peng, MD   [facility] Providers Cardiologist:  Thurmon Fair, MD     Referring MD: Dorothyann Peng, MD   Chief Complaint  Patient presents with   Coronary Artery Disease    History of Present Illness:    Elizabeth Mcfarland is a 83 y.o. female with a hx of CAD s/p PCI-DES of mid LAD in January 2023, well-controlled hypertension and type 2 diabetes mellitus, right breast lumpectomy and radiation therapy in 2020 (no chemotherapy), history of nephrolithiasis, remote history of distal esophageal ulcer causing hematemesis in 2015.  She has a tendency to sinus bradycardia and some evidence suggesting chronotropic incompetence.  She has not had any cardiovascular problems since last appointment other than an unexpected episode of near syncope that occurred Christmas Eve when she jumped out of bed to go to the bathroom, developed nausea, lightheadedness and almost passed out.  She went to the emergency room where she was given antiemetics and IV fluids since her labs suggest that she was dehydrated.  ECG there showed normal sinus rhythm with minor nonspecific T wave changes.  It has not happened since.  She has not had any problems with chest pain or shortness of breath and denies palpitations or frequent problems with dizziness.  She has not had focal neurological events.  Denies lower extremity edema, orthopnea, PND.  Her blood pressure is generally well-controlled, although she has a rather broad pulse pressure, as she did today at 150/63 (rechecked 150/60).  She has stinging pain at the site of a keloid on her left breast.  She does not have any true chest pain.  Metabolic parameters are good.  Her body mass index is 23.  LDL is 67, hemoglobin A1c is 6.7%.  She has normal kidney function.  Her coronary CT angiogram showed a markedly elevated calcium score (93rd percentile for age  and gender).  In addition there was single-vessel CAD with a high-grade stenosis in the proximal-mid LAD artery with a confirmatory reduced FFR of 0.71.  On a CT of the abdomen and pelvis there was evidence of advanced calcification in the abdominal aorta and in the coronary arteries.  In 2017 she underwent an echocardiogram and a pharmacological nuclear myocardial perfusion test.  The perfusion images were normal.  LVEF was normal and there were no major valvular abnormalities.  Doppler measurements suggested "grade 1 diastolic dysfunction".    She has never smoked.  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  She is originally from [place], [place]  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   Cataract    Chronic kidney disease    Diabetes mellitus without complication (HCC)    Esophageal ulcer with bleeding 05/05/2013   05/05/2013 EGD linear distal esophageal ulcer - cause of hematemesis prior to EGD    GERD (gastroesophageal reflux disease)    History of kidney stones    Hyperlipidemia    Hypertension    Kidney cysts    Personal history of radiation therapy    Sarcoidosis 1970    Past Surgical History:  Procedure Laterality Date   ABDOMINAL HYSTERECTOMY     BREAST BIOPSY Left 07/09/2021  BREAST EXCISIONAL BIOPSY Left 11/2018   Fibroadenoma   BREAST LUMPECTOMY     BREAST LUMPECTOMY WITH RADIOACTIVE SEED AND SENTINEL LYMPH NODE BIOPSY Bilateral 11/25/2018   Procedure: BILATERAL BREAST LUMPECTOMIES  WITH BILATERAL  RADIOACTIVE SEEDS AND RIGHT SENTINEL LYMPH NODE BIOPSY;  Surgeon: Griselda Miner, MD;  Location: [facility] OR;  Service: General;  Laterality: Bilateral;   CATARACT EXTRACTION W/ INTRAOCULAR LENS IMPLANT     COLONOSCOPY     COLONOSCOPY, ESOPHAGOGASTRODUODENOSCOPY (EGD) AND ESOPHAGEAL DILATION N/A  05/05/2013   Procedure: COLONOSCOPY, ESOPHAGOGASTRODUODENOSCOPY (EGD) AND ESOPHAGEAL DILATION (ED);  Surgeon: Iva Boop, MD;  Location: [facility] ENDOSCOPY;  Service: Endoscopy;  Laterality: N/A;   LEFT HEART CATH AND CORONARY ANGIOGRAPHY N/A 04/08/2021   Procedure: LEFT HEART CATH AND CORONARY ANGIOGRAPHY;  Surgeon: Tonny Bollman, MD;  Location: [facility] INVASIVE CV LAB;  Service: Cardiovascular;  Laterality: N/A;   LITHOTRIPSY     POLYPECTOMY     UPPER GASTROINTESTINAL ENDOSCOPY      Current Medications: Current Meds  Medication Sig   amLODipine (NORVASC) 10 MG tablet TAKE 1 TABLET(10 MG) BY MOUTH DAILY   anastrozole (ARIMIDEX) 1 MG tablet TAKE 1 TABLET(1 MG) BY MOUTH DAILY   Ascorbic Acid (VITAMIN C) 1000 MG tablet Take 1,000 mg by mouth daily.   aspirin EC 81 MG tablet Take 81 mg by mouth daily.   atorvastatin (LIPITOR) 80 MG tablet Take 1 tablet (80 mg total) by mouth daily.   cholecalciferol (VITAMIN D) 1000 UNITS tablet Take 1,000 Units by mouth daily.   dorzolamide-timolol (COSOPT) 2-0.5 % ophthalmic solution Place 1 drop into both eyes 2 (two) times daily.   glimepiride (AMARYL) 4 MG tablet TAKE 1 TABLET BY MOUTH EVERY MORNING   Multiple Vitamins-Minerals (CENTRUM SILVER 50+WOMEN PO) Take 1 tablet by mouth daily.   olmesartan (BENICAR) 40 MG tablet TAKE 1 TABLET(40 MG) BY MOUTH DAILY   ondansetron (ZOFRAN) 4 MG tablet Take 1 tablet (4 mg total) by mouth every 6 (six) hours.   ONETOUCH ULTRA test strip USE AS DIRECTED TWICE DAILY   pantoprazole (PROTONIX) 40 MG tablet Take 1 tablet (40 mg total) by mouth daily.   Polyethyl Glycol-Propyl Glycol (SYSTANE OP) Place 1 drop into both eyes 2 (two) times daily.   polyethylene glycol (MIRALAX / GLYCOLAX) packet Take 17 g by mouth daily.    sitaGLIPtin (JANUVIA) 100 MG tablet TAKE 1 TABLET ( 100MG ) BY MOUTH DAILY.     Allergies:   Codeine and Percocet [oxycodone-acetaminophen]   Family History: The patient's family history includes Asthma  in her brother; CAD in her maternal grandmother; Cancer (age of onset: 56) in her brother and brother; Colon polyps in her brother and sister; Diabetes in her sister; Early death in her mother; Emphysema in her brother; Heart disease in her brother; Kidney failure in her sister; Stroke in her brother and father. There is no history of Breast cancer, Colon cancer, Esophageal cancer, Rectal cancer, or Stomach cancer.  EKGs/Labs/Other Studies Reviewed:    The following studies were reviewed today:  Cardiac catheterization 04/08/2021    Mid LAD lesion is 75% stenosed.   A drug-eluting stent was successfully placed using a STENT ONYX FRONTIER 3.0X15.   Post intervention, there is a 0% residual stenosis.   The left ventricular systolic function is normal.   LV end diastolic pressure is normal.   1.  Severe single-vessel coronary artery disease with successful stenting of a 75% eccentric stenosis in the mid LAD treated with a 3.0  x 15 mm resolute Onyx DES, reducing the stenosis to 0%.  TIMI-3 flow at baseline and post PCI. 2.  Patent left main, left circumflex, and RCA without significant stenoses 3.  Low/normal LVEDP   Coronary CT angiography 03/26/2021 Coronary Calcium Score:   Left main: 209   Left anterior descending artery: 265   Left circumflex artery: 146   Right coronary artery: 73   Ramus Intermedius: 61   Total: 754   Percentile: 93rd for age, sex, and race matched control.   RCA is a large dominant artery that gives rise to PDA and PLA. Mild calcified plaques in the proximal and mid vessel.   Left main is a large artery that gives rise to LAD, RI, and LCX arteries. Mild ostial mixed plaque with mild distal calcified plaque.   LAD is a large vessel that gives rise to one large D1 Branch. Severe mixed plaque in the proximal-mid LAD. Mild calcified plaque in the LAD at the D1 takeoff. Mild calcified plaque in the mid D1 branch. Small myocardial bridge distal LAD.   LCX  is a non-dominant artery. Mild calcified plaques in the proximal and mid vessel.   There is a ramus intermedius vessel. Mild calcified plaques in the mid vessel.   Other findings:   Normal pulmonary vein drainage into the left atrium.   Normal left atrial appendage without a thrombus.   Small left atrial diverticulum.   Small interatrial vestigial remnant- normal variant.   Extra-cardiac findings: See attached radiology report for non-cardiac structures.   Cardiac Motion- Slab artifact noted.   IMPRESSION: 1. Coronary calcium score of 754. This was 93rd percentile for age, sex, and race matched control.   2. Normal coronary origin with right dominance.   01/04/2016 nuclear stress test Nuclear stress EF: 67%. There was no ST segment deviation noted during stress. The study is normal. This is a low risk study.   Low risk stress nuclear study with normal perfusion and normal left ventricular regional and global systolic function.  01/04/2016 echocardiogram - Left ventricle: The cavity size was normal. Wall thickness was    normal. Systolic function was vigorous. The estimated ejection    fraction was in the range of 65% to 70%. Wall motion was normal;    there were no regional wall motion abnormalities. Doppler    parameters are consistent with abnormal left ventricular    relaxation (grade 1 diastolic dysfunction).     EKG: Personally reviewed the tracing from 03/16/2024 which shows sinus rhythm and nonspecific T wave changes, QTc 442 ms  Recent Labs: 03/12/2023: TSH 1.050 04/27/2023: ALT 24; BUN 12; Creatinine 0.76; Hemoglobin 12.4; Platelet Count 202; Potassium 3.7; Sodium 138  Recent Lipid Panel    Component Value Date/Time   CHOL 178 08/28/2022 1051   TRIG 44 08/28/2022 1051   HDL 102 08/28/2022 1051   CHOLHDL 1.7 08/28/2022 1051   CHOLHDL 2.8 12/24/2015 0306   VLDL 12 12/24/2015 0306   LDLCALC 67 08/28/2022 1051     Risk Assessment/Calculations:            Physical Exam:    VS:  BP (!) 150/63 (BP Location: Left Arm, Patient Position: Sitting, Cuff Size: Normal)   Pulse 71   Ht 5\' 2"  (1.575 m)   Wt 125 lb 9.6 oz (57 kg)   SpO2 98%   BMI 22.97 kg/m     Recheck BP 150/60.  Plan to monitor  Wt Readings from Last 3 Encounters:  06/29/23  125 lb 9.6 oz (57 kg)  06/11/23 128 lb 6.4 oz (58.2 kg)  04/27/23 128 lb 6.4 oz (58.2 kg)     General: Alert, oriented x3, no distress, slender.  Keloid scar left breast Head: no evidence of trauma, PERRL, EOMI, no exophtalmos or lid lag, no myxedema, no xanthelasma; normal ears, nose and oropharynx Neck: normal jugular venous pulsations and no hepatojugular reflux; brisk carotid pulses without delay and no carotid bruits Chest: clear to auscultation, no signs of consolidation by percussion or palpation, normal fremitus, symmetrical and full respiratory excursions Cardiovascular: normal position and quality of the apical impulse, regular rhythm, normal first and second heart sounds, no murmurs, rubs or gallops Abdomen: no tenderness or distention, no masses by palpation, no abnormal pulsatility or arterial bruits, normal bowel sounds, no hepatosplenomegaly Extremities: no clubbing, cyanosis or edema; 2+ radial, ulnar and brachial pulses bilaterally; 2+ right femoral, posterior tibial and dorsalis pedis pulses; 2+ left femoral, posterior tibial and dorsalis pedis pulses; no subclavian or femoral bruits Neurological: grossly nonfocal Psych: Normal mood and affect      ASSESSMENT:    1. Coronary artery disease involving native coronary artery of native heart without angina pectoris   2. Chronotropic incompetence   3. Essential hypertension   4. Diabetes mellitus type 2 in nonobese (HCC)   5. Hypercholesterolemia   6. History of external beam radiation therapy   7. History of breast cancer        PLAN:    In order of problems listed above:  Coronary artery disease: She has never really had  angina pectoris.  Her presentation was with dyspnea on exertion, even though her LVEDP was normal at the time of cardiac catheterization.  Continue treatment with aspirin, statin, but not on beta-blocker due to previous problems of bradycardia. Possible chronotropic incompetence there was evidence of this disorder during detailed recording of her blood pressure during activity and cardiac rehab.  Today in the office her heart rate is normal at 71 bpm.  She has not had episodes of symptomatic bradycardia, other than the syncopal event that led to her evaluation emergency room in December, but this was probably due to dehydration. HTN: She has a very broad pulse pressure with a diastolic of only 60 mmHg, despite mildly elevated systolic blood pressure 150.  At another office visit just a few days ago her blood pressure was 140/70.  No changes were made to her medications today. DM: Will controlled A1c 6.7%. HLP: LDL is at target less than 70.  Always has a great HDL. History of breast cancer status post XRT: It is possible that this contributed to her development of more severe disease in the LAD artery. Weight loss: Weight loss has stabilized and she is actually gained back a few pounds.  Has a very healthy weight at this time.           Medication Adjustments/Labs and Tests Ordered: Current medicines are reviewed at length with the patient today.  Concerns regarding medicines are outlined above.  No orders of the defined types were placed in this encounter.  No orders of the defined types were placed in this encounter.   Patient Instructions  Medication Instructions:  Instead of Protonix- Take: Famotidine, Ranitidine, or Nizatidine as needed  *If you need a refill on your cardiac medications before your next appointment  Follow-Up: At [facility], you and your health needs are our priority.  As part of our continuing mission to provide you with  exceptional heart care, our  providers are all part of one team.  This team includes your primary Cardiologist (physician) and Advanced Practice Providers or APPs (Physician Assistants and Nurse Practitioners) who all work together to provide you with the care you need, when you need it.  Your next appointment:   1 year(s)  Provider:   Thurmon Fair, MD     We recommend signing up for the patient portal called "MyChart".  Sign up information is provided on this After Visit Summary.  MyChart is used to connect with patients for Virtual Visits (Telemedicine).  Patients are able to view lab/test results, encounter notes, upcoming appointments, etc.  Non-urgent messages can be sent to your provider as well.   To learn more about what you can do with MyChart, go to [email or web address].        1st Floor: - Lobby - Registration  - Pharmacy  - Lab - Cafe  2nd Floor: - PV Lab - Diagnostic Testing (echo, CT, nuclear med)  3rd Floor: - Vacant  4th Floor: - TCTS (cardiothoracic surgery) - AFib Clinic - Structural Heart Clinic - Vascular Surgery  - Vascular Ultrasound  5th Floor: - HeartCare Cardiology (general and EP) - Clinical Pharmacy for coumadin, hypertension, lipid, weight-loss medications, and med management appointments    Valet parking services will be available as well.      Signed, Thurmon Fair, MD  06/30/2023 12:56 PM    [facility]

## 2023-06-29 NOTE — Telephone Encounter (Signed)
 Copied from CRM [id number]. Topic: Clinical - Medication Refill >> Jun 29, 2023  9:59 AM Florestine Avers wrote: Most Recent Primary Care Visit:  Provider: Barb Merino  Department: [facility]  Visit Type: MEDICARE AWV, SEQUENTIAL  Date: 06/24/2023  Medication: anastrozole (ARIMIDEX) 1 MG tablet  Has the patient contacted their pharmacy? Yes (Agent: If no, request that the patient contact the pharmacy for the refill. If patient does not wish to contact the pharmacy document the reason why and proceed with request.) (Agent: If yes, when and what did the pharmacy advise?)  Is this the correct pharmacy for this prescription? Yes If no, delete pharmacy and type the correct one.  This is the patient's preferred pharmacy:  [facility] [address]  - [address] [place] [address] Phone: [phone] Fax: [phone]  [facility] [address] [place] [address] Phone: [phone] Fax: [phone]  [id number] [place], [place] - [address] AT [facility] [address] [place] [address] Phone: [phone] Fax: [phone]   Has the prescription been filled recently? No  Is the patient out of the medication? Yes  Has the patient been seen for an appointment in the last year OR does the patient have an upcoming appointment? Yes  Can we respond through MyChart? Yes  Agent: Please be advised that Rx refills may take up to 3 business days. We ask that you follow-up with your pharmacy.

## 2023-06-29 NOTE — Patient Instructions (Signed)
 Medication Instructions:  Instead of Protonix- Take: Famotidine, Ranitidine, or Nizatidine as needed  *If you need a refill on your cardiac medications before your next appointment  Follow-Up: At [facility], you and your health needs are our priority.  As part of our continuing mission to provide you with exceptional heart care, our providers are all part of one team.  This team includes your primary Cardiologist (physician) and Advanced Practice Providers or APPs (Physician Assistants and Nurse Practitioners) who all work together to provide you with the care you need, when you need it.  Your next appointment:   1 year(s)  Provider:   Thurmon Fair, MD     We recommend signing up for the patient portal called "MyChart".  Sign up information is provided on this After Visit Summary.  MyChart is used to connect with patients for Virtual Visits (Telemedicine).  Patients are able to view lab/test results, encounter notes, upcoming appointments, etc.  Non-urgent messages can be sent to your provider as well.   To learn more about what you can do with MyChart, go to [email or web address].        1st Floor: - Lobby - Registration  - Pharmacy  - Lab - Cafe  2nd Floor: - PV Lab - Diagnostic Testing (echo, CT, nuclear med)  3rd Floor: - Vacant  4th Floor: - TCTS (cardiothoracic surgery) - AFib Clinic - Structural Heart Clinic - Vascular Surgery  - Vascular Ultrasound  5th Floor: - HeartCare Cardiology (general and EP) - Clinical Pharmacy for coumadin, hypertension, lipid, weight-loss medications, and med management appointments    Valet parking services will be available as well.

## 2023-07-01 ENCOUNTER — Telehealth: Payer: Self-pay | Admitting: Pharmacist

## 2023-07-01 ENCOUNTER — Other Ambulatory Visit: Payer: Self-pay

## 2023-07-01 DIAGNOSIS — E119 Type 2 diabetes mellitus without complications: Secondary | ICD-10-CM

## 2023-07-01 DIAGNOSIS — I700 Atherosclerosis of aorta: Secondary | ICD-10-CM

## 2023-07-01 DIAGNOSIS — I10 Essential (primary) hypertension: Secondary | ICD-10-CM

## 2023-07-01 MED ORDER — SITAGLIPTIN PHOSPHATE 100 MG PO TABS: ORAL_TABLET | ORAL | 2 refills | Status: DC

## 2023-07-01 MED ORDER — ATORVASTATIN CALCIUM 80 MG PO TABS: 80.0000 mg | ORAL_TABLET | Freq: Every day | ORAL | 3 refills | Status: AC

## 2023-07-01 MED ORDER — OLMESARTAN MEDOXOMIL 40 MG PO TABS: ORAL_TABLET | ORAL | 1 refills | Status: DC

## 2023-07-01 MED ORDER — AMLODIPINE BESYLATE 10 MG PO TABS: ORAL_TABLET | ORAL | 1 refills | Status: DC

## 2023-07-01 MED ORDER — PANTOPRAZOLE SODIUM 40 MG PO TBEC: 40.0000 mg | DELAYED_RELEASE_TABLET | Freq: Every day | ORAL | 2 refills | Status: AC

## 2023-07-01 MED ORDER — ANASTROZOLE 1 MG PO TABS: ORAL_TABLET | ORAL | 1 refills | Status: DC

## 2023-07-01 NOTE — Progress Notes (Signed)
   07/01/2023  Patient ID: Elizabeth Mcfarland, female   DOB: 08-05-1940, 83 y.o.   MRN: [id number]  Received the Provider portion of the Patient's application for Januvia Patient Assistance.  Form was faxed to the RX Med Assistance Fax Line and a message was sent to Star Age, CPhT  Beecher Mcardle, PharmD, [facility] Clinical Pharmacist [phone]

## 2023-07-02 NOTE — Telephone Encounter (Signed)
 PAP: Application for Januvia has been submitted to [employer], via fax

## 2023-07-09 ENCOUNTER — Encounter: Payer: Self-pay | Admitting: Internal Medicine

## 2023-07-09 ENCOUNTER — Ambulatory Visit: Admitting: Internal Medicine

## 2023-07-09 VITALS — BP 130/70 | HR 66 | Temp 98.5°F | Ht 62.0 in | Wt 126.0 lb

## 2023-07-09 DIAGNOSIS — M79609 Pain in unspecified limb: Secondary | ICD-10-CM

## 2023-07-09 DIAGNOSIS — E1151 Type 2 diabetes mellitus with diabetic peripheral angiopathy without gangrene: Secondary | ICD-10-CM | POA: Diagnosis not present

## 2023-07-09 DIAGNOSIS — K219 Gastro-esophageal reflux disease without esophagitis: Secondary | ICD-10-CM

## 2023-07-09 DIAGNOSIS — R202 Paresthesia of skin: Secondary | ICD-10-CM

## 2023-07-09 NOTE — Patient Instructions (Addendum)
 Start taking pantoprazole Monday through Friday, skipping the weekends  Please call me in two weeks to let me know how you are feeling [phone]  If okay, I will decrease dose of pantoprazole to 20mg  Monday through Friday.   GERD in Adults: Diet Changes When you have gastroesophageal reflux disease (GERD), you may need to make changes to your diet. Choosing the right foods can help with your symptoms. Think about working with an expert in healthy eating called a dietitian. They can help you make healthy food choices. What are tips for following this plan? Reading food labels Look for foods that are low in saturated fat. Foods that may help with your symptoms include: Foods with less than 5% of daily value (DV) of fat. Foods with 0 grams of trans fat. Cooking [employer] in ways that don't use a lot of fat. These ways include: Baking. Steaming. Grilling. Broiling. To add flavor, try to use herbs that are low in spice and acidity. Avoid frying your food. Meal planning  Eat small meals often rather than eating 3 large meals each day. Eat your meals slowly in a place where you feel relaxed. If told by your health care provider, avoid: Foods that cause symptoms. Keep a food diary to keep track of foods that cause symptoms. Alcohol. Drinking a lot of liquid with meals. General instructions For 2-3 hours after you eat, avoid: Bending over. Exercise. Lying down. Chew sugar-free gum after meals. What foods should I eat? Eat a healthy diet. Try to include: Foods with high amounts of fiber. These include: Fruits and vegetables. Whole grains and beans. Low-fat dairy products. Lean meats, fish, and poultry. Egg whites. Foods that cause symptoms in someone else may not cause symptoms for you. Work with your provider to find foods that are safe for you. The items listed above may not be all the foods and drinks you can have. Talk with a dietitian to learn more. The items listed  above may not be a complete list of foods and beverages you can eat and drink. Contact a dietitian for more information. What foods should I avoid? Limiting some of these foods may help with your symptoms. Each person is different. Talk with a dietitian or your provider to help you find the exact foods to avoid. Some of the foods to avoid may include: Fruits Fruits with a lot of acid in them. These may include citrus fruits, such as oranges, grapefruit, pineapple, and lemons. Vegetables Deep-fried vegetables, such as [place] fries. Vegetables, sauces, or toppings made with added fat and vegetables with acid in them. These may include tomatoes and tomato products, chili peppers, onions, garlic, and horseradish. Grains Pastries or quick breads with added fat. Meats and other proteins High-fat meats, such as fatty beef or pork, hot dogs, ribs, ham, sausage, salami, and bacon. Fried meat or protein, such as fried fish and fried chicken. Egg yolks. Fats and oils Butter. Margarine. Shortening. Ghee. Drinks Coffee and other drinks with caffeine in them. Fizzy and sugary drinks, such as soda and energy drinks. Fruit juice made with acidic fruits, such as orange or grapefruit. Tomato juice. Sweets and desserts Chocolate and cocoa. Donuts. Seasonings and condiments Mint, such as peppermint and spearmint. Condiments, herbs, or seasonings that cause symptoms. These may include curry, hot sauce, or vinegar-based salad dressings. The items listed above may not be all the foods and drinks you should avoid. Talk with a dietitian to learn more. Questions to ask your health care  provider Changes to your diet and everyday life are often the first steps taken to manage symptoms of GERD. If these changes don't help, talk with your provider about taking medicines. Where to find more information International Foundation for Gastrointestinal Disorders: aboutgerd.org This information is not intended to replace  advice given to you by your health care provider. Make sure you discuss any questions you have with your health care provider. Document Revised: 01/20/2023 Document Reviewed: 08/06/2022 Elsevier Patient Education  2024 [employer].

## 2023-07-09 NOTE — Progress Notes (Signed)
 I,Victoria T Basil Lim, CMA,acting as a Neurosurgeon for Smiley Dung, MD.,have documented all relevant documentation on the behalf of Smiley Dung, MD,as directed by  Smiley Dung, MD while in the presence of Smiley Dung, MD.  Subjective:  Patient ID: Elizabeth Mcfarland , female    DOB: 1940/06/17 , 83 y.o.   MRN: [id number]  Chief Complaint  Patient presents with   Med Check    Patient presents today for pantoprazole  follow up. She reports compliance with medications. Since starting the medicine she has noticed numbness in right hand.     HPI Discussed the use of AI scribe software for clinical note transcription with the patient, who gave verbal consent to proceed.  History of Present Illness Elizabeth Mcfarland is an 83 year old female who presents for follow-up on pantoprazole  treatment.  Since starting pantoprazole , her heartburn symptoms have improved significantly, with only mild heartburn now compared to severe symptoms prior to treatment. She has been taking pantoprazole  daily. She mentions experiencing hand cramps in the mornings since starting the medication, which led her to stop taking it. However, she is unsure if the cramps are related to pantoprazole  or another medication, as she takes multiple medications and sometimes gets them confused.  She reports tingling in her hand, which she noticed last week. This tingling occurs primarily in the mornings when she wakes up and improves after she gets up. She has a history of similar symptoms in her legs, which have improved over time. She recalls seeing a neurologist in 2022 for tingling in her arm and hand. No trouble opening jars or dropping things, and the tingling is not constant, as it comes and goes.  Her current medications include amlodipine  10 mg daily, anastrozole  for breast cancer history, aspirin , atorvastatin  80 mg for cholesterol, olmesartan , and Januvia  for diabetes. She is working on obtaining patient assistance for Januvia   due to its cost.   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   Cataract    Chronic kidney disease    Diabetes mellitus without complication (HCC)    Esophageal ulcer with bleeding 05/05/2013   05/05/2013 EGD linear distal esophageal ulcer - cause of hematemesis prior to EGD    GERD (gastroesophageal reflux disease)    History of kidney stones    Hyperlipidemia    Hypertension    Kidney cysts    Personal history of radiation therapy    Sarcoidosis 1970     Family History  Problem Relation Age of Onset   Early death Mother    Stroke Father    Diabetes Sister    Kidney failure Sister    Colon polyps Sister    Stroke Brother    Colon polyps Brother    Cancer Brother 48       prostate cancer   Heart disease Brother    Emphysema Brother    Cancer Brother 64       prostate cancer   Asthma Brother    CAD Maternal Grandmother    Breast cancer Neg Hx    Colon cancer Neg Hx    Esophageal cancer Neg Hx    Rectal cancer Neg Hx    Stomach cancer Neg Hx      Current Outpatient Medications:    amLODipine  (NORVASC ) 10 MG tablet, TAKE 1 TABLET (10MG ) BY  MOUTH DAILY., Disp: 90 tablet, Rfl: 1   anastrozole  (ARIMIDEX ) 1 MG tablet, TAKE 1 TABLET BY MOUTH DAILY., Disp: 90 tablet, Rfl: 1   Ascorbic Acid (VITAMIN C) 1000 MG tablet, Take 1,000 mg by mouth daily., Disp: , Rfl:    aspirin  EC 81 MG tablet, Take 81 mg by mouth daily., Disp: , Rfl:    atorvastatin  (LIPITOR ) 80 MG tablet, Take 1 tablet (80 mg total) by mouth daily., Disp: 90 tablet, Rfl: 3   cholecalciferol (VITAMIN D ) 1000 UNITS tablet, Take 1,000 Units by mouth daily., Disp: , Rfl:    dorzolamide -timolol  (COSOPT ) 2-0.5 % ophthalmic solution, Place 1 drop into both eyes 2 (two) times daily., Disp: , Rfl:    glimepiride  (AMARYL ) 4 MG tablet, TAKE 1 TABLET BY MOUTH EVERY MORNING, Disp: 90  tablet, Rfl: 1   Multiple Vitamins-Minerals (CENTRUM SILVER 50+WOMEN PO), Take 1 tablet by mouth daily., Disp: , Rfl:    olmesartan  (BENICAR ) 40 MG tablet, TAKE 1 TABLET(40 MG) BY MOUTH DAILY, Disp: 90 tablet, Rfl: 1   ondansetron  (ZOFRAN ) 4 MG tablet, Take 1 tablet (4 mg total) by mouth every 6 (six) hours., Disp: 12 tablet, Rfl: 0   ONETOUCH ULTRA test strip, USE AS DIRECTED TWICE DAILY, Disp: 100 strip, Rfl: 3   pantoprazole  (PROTONIX ) 40 MG tablet, Take 1 tablet (40 mg total) by mouth daily., Disp: 90 tablet, Rfl: 2   Polyethyl Glycol-Propyl Glycol (SYSTANE OP), Place 1 drop into both eyes 2 (two) times daily., Disp: , Rfl:    polyethylene glycol (MIRALAX  / GLYCOLAX ) packet, Take 17 g by mouth daily. , Disp: , Rfl:    sitaGLIPtin  (JANUVIA ) 100 MG tablet, TAKE 1 TABLET ( 100MG ) BY MOUTH DAILY., Disp: 90 tablet, Rfl: 2   acetaminophen  (TYLENOL ) 500 MG tablet, Take 1,000 mg by mouth every 6 (six) hours as needed for mild pain or moderate pain. (Patient not taking: Reported on 06/29/2023), Disp: , Rfl:    nitroGLYCERIN  (NITROSTAT ) 0.4 MG SL tablet, Place 1 tablet (0.4 mg total) under the tongue every 5 (five) minutes as needed for chest pain. (Patient not taking: Reported on 07/09/2023), Disp: 25 tablet, Rfl: 1   Allergies  Allergen Reactions   Codeine Nausea And Vomiting   Percocet [Oxycodone-Acetaminophen ] Nausea And Vomiting     Review of Systems  Constitutional: Negative.   Respiratory: Negative.    Cardiovascular: Negative.   Gastrointestinal: Negative.   Neurological: Negative.   Psychiatric/Behavioral: Negative.       Today's Vitals   07/09/23 1053  BP: 130/70  Pulse: 66  Temp: 98.5 F (36.9 C)  SpO2: 98%  Weight: 126 lb (57.2 kg)  Height: 5\' 2"  (1.575 m)   Body mass index is 23.05 kg/m.  Wt Readings from Last 3 Encounters:  07/09/23 126 lb (57.2 kg)  06/29/23 125 lb 9.6 oz (57 kg)  06/11/23 128 lb 6.4 oz (58.2 kg)     Objective:  Physical Exam Vitals and nursing  note reviewed.  Constitutional:      Appearance: Normal appearance.  HENT:     Head: Normocephalic and atraumatic.  Eyes:     Extraocular Movements: Extraocular movements intact.  Cardiovascular:     Rate and Rhythm: Normal rate and regular rhythm.     Heart sounds: Normal heart sounds.  Pulmonary:     Effort: Pulmonary effort is normal.     Breath sounds: Normal breath sounds.  Musculoskeletal:     Cervical back: Normal range of motion.  Skin:  General: Skin is warm.  Neurological:     General: No focal deficit present.     Mental Status: She is alert.  Psychiatric:        Mood and Affect: Mood normal.        Behavior: Behavior normal.         Assessment And Plan:  Gastroesophageal reflux disease without esophagitis Assessment & Plan: Heartburn improved with pantoprazole . Morning cramps unrelated to medication. - Reduce pantoprazole  to Monday through Friday dosing - Monitor symptoms and consider further dose reduction if symptoms remain controlled. - Consider discontinuing if symptoms remain controlled.   Paresthesia and pain of right extremity Assessment & Plan: Tingling and numbness in hand, not consistent with carpal tunnel syndrome. Nerve conduction study discussed but deferred. - Avoid sleeping on the right side. - Monitor symptoms and report persistence. - Consider B vitamins for nerve health.   Type 2 diabetes mellitus with diabetic peripheral angiopathy without gangrene, without long-term current use of insulin  (HCC) Assessment & Plan: On Januvia . Discussed medication cost and arranged patient assistance. - Arrange patient assistance for Januvia .     Return if symptoms worsen or fail to improve.  Patient was given opportunity to ask questions. Patient verbalized understanding of the plan and was able to repeat key elements of the plan. All questions were answered to their satisfaction.    I, Smiley Dung, MD, have reviewed all documentation for  this visit. The documentation on 07/09/23 for the exam, diagnosis, procedures, and orders are all accurate and complete.   IF YOU HAVE BEEN REFERRED TO A SPECIALIST, IT MAY TAKE 1-2 WEEKS TO SCHEDULE/PROCESS THE REFERRAL. IF YOU HAVE NOT HEARD FROM US /SPECIALIST IN TWO WEEKS, PLEASE GIVE US  A CALL AT [phone] X [phone].   THE PATIENT IS ENCOURAGED TO PRACTICE SOCIAL DISTANCING DUE TO THE COVID-19 PANDEMIC.

## 2023-07-09 NOTE — Telephone Encounter (Signed)
 Called [employer] to check Status of PAP application and Attestation letter has been mailed to Patient's home address on 07/03/23- will follow up.

## 2023-07-14 ENCOUNTER — Telehealth: Payer: Self-pay | Admitting: Pharmacist

## 2023-07-14 DIAGNOSIS — E1151 Type 2 diabetes mellitus with diabetic peripheral angiopathy without gangrene: Secondary | ICD-10-CM

## 2023-07-14 NOTE — Assessment & Plan Note (Signed)
 Heartburn improved with pantoprazole . Morning cramps unrelated to medication. - Reduce pantoprazole  to Monday through Friday dosing - Monitor symptoms and consider further dose reduction if symptoms remain controlled. - Consider discontinuing if symptoms remain controlled.

## 2023-07-14 NOTE — Assessment & Plan Note (Signed)
 On Januvia . Discussed medication cost and arranged patient assistance. - Arrange patient assistance for Januvia .

## 2023-07-14 NOTE — Progress Notes (Signed)
   07/14/2023  Patient ID: Elizabeth Mcfarland, female   DOB: [birth year], 84 y.o.   MRN: [id number]  Patient was called regarding medication assistance with Januvia . Unfortunately, she did not answer the phone. HIPAA compliant message was left on her voicemail.   Geronimo Krabbe, PharmD, BCACP Clinical Pharmacist [phone]

## 2023-07-14 NOTE — Assessment & Plan Note (Signed)
 Tingling and numbness in hand, not consistent with carpal tunnel syndrome. Nerve conduction study discussed but deferred. - Avoid sleeping on the right side. - Monitor symptoms and report persistence. - Consider B vitamins for nerve health.

## 2023-07-16 ENCOUNTER — Telehealth: Payer: Self-pay | Admitting: Pharmacist

## 2023-07-16 DIAGNOSIS — E1151 Type 2 diabetes mellitus with diabetic peripheral angiopathy without gangrene: Secondary | ICD-10-CM

## 2023-07-16 NOTE — Progress Notes (Addendum)
   07/16/2023  Patient ID: Elizabeth Mcfarland, female   DOB: 1941/02/04, 83 y.o.   MRN: [id number]  Patient was called regarding medication assistance. Unfortunately, she did not answer her phone.  HIPAA compliant message was left on her voicemail. Today's call was the 3rd unsuccessful phone call.   Geronimo Krabbe, PharmD, BCACP Clinical Pharmacist [phone]

## 2023-07-20 ENCOUNTER — Telehealth: Payer: Self-pay | Admitting: Pharmacist

## 2023-07-20 DIAGNOSIS — E1151 Type 2 diabetes mellitus with diabetic peripheral angiopathy without gangrene: Secondary | ICD-10-CM

## 2023-07-20 NOTE — Telephone Encounter (Signed)
 Reached out to Patient and she did receive letter from [employer] and will send back to Co this week.

## 2023-07-20 NOTE — Progress Notes (Signed)
   07/20/2023  Patient ID: Elizabeth Mcfarland, female   DOB: [birth year], 83 y.o.   MRN: [id number]  Patient was called regarding medication assistance with Januvia .  Unfortunately, she did not answer her phone. HIPAA compliant message was left on her voicemail.  Today's call was unsuccessful call #4.    Plan: Close Patient's Pharmacy case.  Will gladly reopen upon phone call from patient or request from provider.   Geronimo Krabbe, PharmD, BCACP Clinical Pharmacist [phone]

## 2023-07-22 DIAGNOSIS — E1142 Type 2 diabetes mellitus with diabetic polyneuropathy: Secondary | ICD-10-CM | POA: Diagnosis not present

## 2023-07-22 DIAGNOSIS — I10 Essential (primary) hypertension: Secondary | ICD-10-CM | POA: Diagnosis not present

## 2023-07-24 DIAGNOSIS — E1142 Type 2 diabetes mellitus with diabetic polyneuropathy: Secondary | ICD-10-CM | POA: Diagnosis not present

## 2023-07-24 DIAGNOSIS — I10 Essential (primary) hypertension: Secondary | ICD-10-CM | POA: Diagnosis not present

## 2023-07-28 ENCOUNTER — Telehealth: Payer: Self-pay | Admitting: Pharmacist

## 2023-07-28 DIAGNOSIS — E1151 Type 2 diabetes mellitus with diabetic peripheral angiopathy without gangrene: Secondary | ICD-10-CM

## 2023-07-28 NOTE — Progress Notes (Signed)
   07/28/2023  Patient ID: Elizabeth Mcfarland, female   DOB: 1940-09-09, 83 y.o.   MRN: [id number]   Patient was called regarding medication assistance with Januvia . Unfortunately, she did not answer the phone, HIPAA compliant message was left on her voicemail.  Today's call was unsuccessful phone call #5.  Her HgA1c from 06/11/23 was 6.7% up from 6.4%  Plan:  Will close Pharmacy case due to inability to contact.  Will gladly outreach patient again upon request or will reopen her case with a call back from the patient.  Geronimo Krabbe, PharmD, BCACP Clinical Pharmacist [phone]

## 2023-07-28 NOTE — Telephone Encounter (Signed)
 PAP: Patient assistance application for Januvia  has been approved by PAP Companies: [employer] from 07/24/2023 to 03/23/2024. Medication should be delivered to PAP Delivery: Home. For further shipping updates, please contact [employer] at [phone]. Patient ID is: [id number]     delivery will be to home address 07/29/2023

## 2023-07-28 NOTE — Progress Notes (Signed)
 Pharmacy Medication Assistance Program Note    07/28/2023  Patient ID: Elizabeth Mcfarland, female   DOB: 1940-11-21, 83 y.o.   MRN: [id number]     07/28/2023  Outreach [employer]  Initial Outreach Date ([employer]) 06/16/2023  Manufacturer Medication One [employer]  [employer] Januvia   Dose of Januvia  100 mg  Type of Radiographer, therapeutic Assistance  Date Application Sent to Patient 06/16/2023  Application Items Requested Application;Proof of Income  Date Application Sent to Prescriber 06/17/2023  Name of Prescriber Cleave Curling  Date Application Received From Patient 07/02/2023  Application Items Received From Patient Application;Proof of Income  Date Application Received From Provider 06/26/2023  Date Application Submitted to Manufacturer 07/02/2023  Method Application Sent to Manufacturer Fax  Patient Assistance Determination Approved  Patient Notification Method Telephone Call  Telephone Call Outcome Left Voicemail   Delivery of medication is expected on 07/29/2023 to Home Address.

## 2023-08-18 ENCOUNTER — Telehealth: Payer: Self-pay | Admitting: Internal Medicine

## 2023-08-18 DIAGNOSIS — E1151 Type 2 diabetes mellitus with diabetic peripheral angiopathy without gangrene: Secondary | ICD-10-CM

## 2023-08-18 NOTE — Telephone Encounter (Unsigned)
 Copied from CRM [id number]. Topic: Clinical - Medication Refill >> Aug 18, 2023 12:37 PM Kevelyn M wrote: Medication: glimepiride  (AMARYL ) 4 MG table  Has the patient contacted their pharmacy? Yes (Agent: If no, request that the patient contact the pharmacy for the refill. If patient does not wish to contact the pharmacy document the reason why and proceed with request.) (Agent: If yes, when and what did the pharmacy advise?)  This is the patient's preferred pharmacy:  [id number] [place], [place] - [address] [place] [address] Phone: [phone] Fax: [phone]  Is this the correct pharmacy for this prescription? Yes If no, delete pharmacy and type the correct one.   Has the prescription been filled recently? No  Is the patient out of the medication? Yes  Has the patient been seen for an appointment in the last year OR does the patient have an upcoming appointment? Yes  Can we respond through MyChart? Yes  Agent: Please be advised that Rx refills may take up to 3 business days. We ask that you follow-up with your pharmacy.

## 2023-08-19 MED ORDER — GLIMEPIRIDE 4 MG PO TABS: 4.0000 mg | ORAL_TABLET | Freq: Every morning | ORAL | 1 refills | Status: DC

## 2023-08-19 NOTE — Telephone Encounter (Signed)
 Last Fill: 06/16/23  Last OV: 07/09/23 Next OV: 08/24/23 CPE  Routing to provider for review/authorization.

## 2023-08-19 NOTE — Telephone Encounter (Signed)
 Last Fill: 06/16/23  Last OV:  Next OV:  Routing to provider for review/authorization.

## 2023-08-22 DIAGNOSIS — E1142 Type 2 diabetes mellitus with diabetic polyneuropathy: Secondary | ICD-10-CM | POA: Diagnosis not present

## 2023-08-22 DIAGNOSIS — I10 Essential (primary) hypertension: Secondary | ICD-10-CM | POA: Diagnosis not present

## 2023-08-23 DIAGNOSIS — I10 Essential (primary) hypertension: Secondary | ICD-10-CM | POA: Diagnosis not present

## 2023-08-23 DIAGNOSIS — E1142 Type 2 diabetes mellitus with diabetic polyneuropathy: Secondary | ICD-10-CM | POA: Diagnosis not present

## 2023-08-24 ENCOUNTER — Encounter: Payer: Self-pay | Admitting: Internal Medicine

## 2023-08-24 ENCOUNTER — Ambulatory Visit (INDEPENDENT_AMBULATORY_CARE_PROVIDER_SITE_OTHER): Payer: Self-pay | Admitting: Internal Medicine

## 2023-08-24 ENCOUNTER — Ambulatory Visit: Payer: Self-pay | Admitting: Internal Medicine

## 2023-08-24 VITALS — BP 126/72 | HR 67 | Temp 98.7°F | Ht 62.0 in | Wt 123.2 lb

## 2023-08-24 DIAGNOSIS — Z0000 Encounter for general adult medical examination without abnormal findings: Secondary | ICD-10-CM

## 2023-08-24 DIAGNOSIS — R2689 Other abnormalities of gait and mobility: Secondary | ICD-10-CM

## 2023-08-24 DIAGNOSIS — E1151 Type 2 diabetes mellitus with diabetic peripheral angiopathy without gangrene: Secondary | ICD-10-CM | POA: Diagnosis not present

## 2023-08-24 DIAGNOSIS — R200 Anesthesia of skin: Secondary | ICD-10-CM

## 2023-08-24 DIAGNOSIS — K219 Gastro-esophageal reflux disease without esophagitis: Secondary | ICD-10-CM

## 2023-08-24 DIAGNOSIS — R1013 Epigastric pain: Secondary | ICD-10-CM | POA: Diagnosis not present

## 2023-08-24 DIAGNOSIS — G8929 Other chronic pain: Secondary | ICD-10-CM | POA: Diagnosis not present

## 2023-08-24 DIAGNOSIS — R202 Paresthesia of skin: Secondary | ICD-10-CM | POA: Diagnosis not present

## 2023-08-24 DIAGNOSIS — I25119 Atherosclerotic heart disease of native coronary artery with unspecified angina pectoris: Secondary | ICD-10-CM

## 2023-08-24 DIAGNOSIS — F40240 Claustrophobia: Secondary | ICD-10-CM

## 2023-08-24 DIAGNOSIS — M545 Low back pain, unspecified: Secondary | ICD-10-CM

## 2023-08-24 DIAGNOSIS — I119 Hypertensive heart disease without heart failure: Secondary | ICD-10-CM | POA: Diagnosis not present

## 2023-08-24 LAB — POCT URINALYSIS DIPSTICK
Bilirubin, UA: NEGATIVE
Blood, UA: NEGATIVE
Glucose, UA: NEGATIVE
Ketones, UA: NEGATIVE
Leukocytes, UA: NEGATIVE
Nitrite, UA: NEGATIVE
Protein, UA: NEGATIVE
Spec Grav, UA: 1.02 (ref 1.010–1.025)
Urobilinogen, UA: 0.2 U/dL
pH, UA: 7 (ref 5.0–8.0)

## 2023-08-24 NOTE — Assessment & Plan Note (Signed)

## 2023-08-24 NOTE — Patient Instructions (Signed)

## 2023-08-24 NOTE — Progress Notes (Signed)
 I,Victoria T Basil Lim, CMA,acting as a Neurosurgeon for Smiley Dung, MD.,have documented all relevant documentation on the behalf of Smiley Dung, MD,as directed by  Smiley Dung, MD while in the presence of Smiley Dung, MD.  Subjective:    Patient ID: Elizabeth Mcfarland , female    DOB: April 07, 1940 , 83 y.o.   MRN: [id number]  Chief Complaint  Patient presents with   Annual Exam    Patient presents today for annual exam. Patient states she is having a pain in her back, she describes the pain as lower back pain, she states she has been using a icy hot patch, the pain doesn't radiate anywhere else. The pain has caused her to become unsteady on her feet. Patient has fell twice, no injuries. Patient denies headache, chest pain or sob.    Diabetes   Hypentensive heart disease    HPI Discussed the use of AI scribe software for clinical note transcription with the patient, who gave verbal consent to proceed.  History of Present Illness Elizabeth Mcfarland is an 83 year old female with diabetes and chronic back pain who presents for a physical and diabetes check. She is accompanied by her daughter who is concerned about her balance and stability.  She experiences significant lower back pain affecting her mobility, particularly her ability to walk. The pain has been persistent but worsened over the past one to two weeks, with intensity ranging from 5 to 8 on a scale of 10. She uses an icy hot patch for relief, which provides some help, and has tried Tylenol  without success. She has a history of arthritis in her back and recalls having an x-ray done last June. She has not seen an orthopedic specialist but has attended physical therapy in the past, though she does not recall the details. No tingling down her leg is reported.  Her blood sugar levels range from 135 to 140, occasionally dropping to 120. She attributes higher readings to dietary indiscretions. Her current medications include glimepiride  4 mg  daily, olmesartan  40 mg for blood pressure, atorvastatin  80 mg for cholesterol, and pantoprazole  40 mg for reflux. She also takes vitamin D  and vitamin C supplements.  She experiences balance issues and has had recent falls, including one in the garden last weekend. She describes losing her balance while standing and digging around a bush. She also reports episodes of feeling something in her ear, which is not painful but noticeable, and has had a syncope episode in the past. No ringing in the ears or hearing issues are reported.  She experiences stiffness in her hands upon waking, which she attributes to arthritis. She reports difficulty moving her fingers in the morning, describing them as 'stiff' and sometimes 'numb'. She has a family history of lupus on her daughter's side but not on her own.  She spends significant time in her garden, which she considers her primary form of exercise, often spending up to five hours at a time. She acknowledges that she could drink more water , especially during warmer months.   Diabetes She presents for her follow-up diabetic visit. She has type 2 diabetes mellitus. Her disease course has been stable. There are no hypoglycemic associated symptoms. Pertinent negatives for hypoglycemia include no dizziness or headaches. Pertinent negatives for diabetes include no chest pain, no polydipsia, no polyphagia and no polyuria. There are no hypoglycemic complications. Symptoms are stable. There are no diabetic complications. Risk factors for coronary artery disease include hypertension, sedentary lifestyle  and dyslipidemia. Current diabetic treatment includes oral agent (dual therapy). She is compliant with treatment all of the time. Her weight is stable. She is following a generally healthy diet. When asked about meal planning, she reported none. She participates in exercise daily (gardening). (Blood sugars was 210 last night, she took her glyburide at bedtime and her blood  sugar this morning was 92 after taking a 1/2 tablet) An ACE inhibitor/angiotensin II receptor blocker is being taken. Eye exam is current.  Hypertension This is a chronic problem. The current episode started more than 1 year ago. The problem is unchanged. Pertinent negatives include no chest pain, headaches or palpitations. Past treatments include ACE inhibitors. The current treatment provides significant improvement.     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   Cataract    Chronic kidney disease    Diabetes mellitus without complication (HCC)    Esophageal ulcer with bleeding 05/05/2013   05/05/2013 EGD linear distal esophageal ulcer - cause of hematemesis prior to EGD    GERD (gastroesophageal reflux disease)    History of kidney stones    Hyperlipidemia    Hypertension    Kidney cysts    Personal history of radiation therapy    Sarcoidosis 1970     Family History  Problem Relation Age of Onset   Early death Mother    Stroke Father    Diabetes Sister    Kidney failure Sister    Colon polyps Sister    Stroke Brother    Colon polyps Brother    Cancer Brother 73       prostate cancer   Heart disease Brother    Emphysema Brother    Cancer Brother 64       prostate cancer   Asthma Brother    CAD Maternal Grandmother    Breast cancer Neg Hx    Colon cancer Neg Hx    Esophageal cancer Neg Hx    Rectal cancer Neg Hx    Stomach cancer Neg Hx      Current Outpatient Medications:    acetaminophen  (TYLENOL ) 500 MG tablet, Take 1,000 mg by mouth every 6 (six) hours as needed for mild pain (pain score 1-3) or moderate pain (pain score 4-6)., Disp: , Rfl:    amLODipine  (NORVASC ) 10 MG tablet, TAKE 1 TABLET (10MG ) BY MOUTH DAILY., Disp: 90 tablet, Rfl: 1   anastrozole  (ARIMIDEX ) 1 MG tablet, TAKE 1 TABLET BY MOUTH DAILY., Disp: 90 tablet,  Rfl: 1   Ascorbic Acid (VITAMIN C) 1000 MG tablet, Take 1,000 mg by mouth daily., Disp: , Rfl:    aspirin  EC 81 MG tablet, Take 81 mg by mouth daily., Disp: , Rfl:    atorvastatin  (LIPITOR ) 80 MG tablet, Take 1 tablet (80 mg total) by mouth daily., Disp: 90 tablet, Rfl: 3   cholecalciferol (VITAMIN D ) 1000 UNITS tablet, Take 1,000 Units by mouth daily., Disp: , Rfl:    dorzolamide -timolol  (COSOPT ) 2-0.5 % ophthalmic solution, Place 1 drop into both eyes 2 (two) times daily., Disp: , Rfl:    glimepiride  (AMARYL ) 4 MG tablet, Take 1 tablet (4 mg total) by mouth every morning., Disp: 90 tablet, Rfl: 1   Multiple Vitamins-Minerals (CENTRUM SILVER 50+WOMEN PO), Take 1 tablet by mouth daily., Disp: , Rfl:    olmesartan  (BENICAR ) 40 MG tablet,  TAKE 1 TABLET(40 MG) BY MOUTH DAILY, Disp: 90 tablet, Rfl: 1   ONETOUCH ULTRA test strip, USE AS DIRECTED TWICE DAILY, Disp: 100 strip, Rfl: 3   pantoprazole  (PROTONIX ) 40 MG tablet, Take 1 tablet (40 mg total) by mouth daily., Disp: 90 tablet, Rfl: 2   Polyethyl Glycol-Propyl Glycol (SYSTANE OP), Place 1 drop into both eyes 2 (two) times daily., Disp: , Rfl:    polyethylene glycol (MIRALAX  / GLYCOLAX ) packet, Take 17 g by mouth daily. , Disp: , Rfl:    sitaGLIPtin  (JANUVIA ) 100 MG tablet, TAKE 1 TABLET ( 100MG ) BY MOUTH DAILY., Disp: 90 tablet, Rfl: 2   nitroGLYCERIN  (NITROSTAT ) 0.4 MG SL tablet, Place 1 tablet (0.4 mg total) under the tongue every 5 (five) minutes as needed for chest pain. (Patient not taking: Reported on 07/09/2023), Disp: 25 tablet, Rfl: 1   Allergies  Allergen Reactions   Codeine Nausea And Vomiting   Percocet [Oxycodone-Acetaminophen ] Nausea And Vomiting    The patient states she uses status post hysterectomy for birth control. No LMP recorded. Patient has had a hysterectomy.. Negative for Dysmenorrhea. Negative for: breast discharge, breast lump(s), breast pain and breast self exam. Associated symptoms include abnormal vaginal bleeding.  Pertinent negatives include abnormal bleeding (hematology), anxiety, decreased libido, depression, difficulty falling sleep, dyspareunia, history of infertility, nocturia, sexual dysfunction, sleep disturbances, urinary incontinence, urinary urgency, vaginal discharge and vaginal itching. Diet regular.The patient states her exercise level is  moderate, she works in her garden for hours at a time.  . The patient's tobacco use is:  Social History   Tobacco Use  Smoking Status Never  Smokeless Tobacco Never  . She has been exposed to passive smoke. The patient's alcohol use is:  Social History   Substance and Sexual Activity  Alcohol Use No  .    Review of Systems  Constitutional: Negative.   HENT: Negative.    Eyes: Negative.   Respiratory: Negative.    Cardiovascular: Negative.  Negative for chest pain and palpitations.  Gastrointestinal: Negative.   Endocrine: Negative.  Negative for polydipsia, polyphagia and polyuria.  Genitourinary: Negative.   Musculoskeletal:  Positive for back pain.  Skin: Negative.   Allergic/Immunologic: Negative.   Neurological: Negative.  Negative for dizziness and headaches.  Hematological: Negative.   Psychiatric/Behavioral: Negative.       Today's Vitals   08/24/23 0945  BP: 126/72  Pulse: 67  Temp: 98.7 F (37.1 C)  SpO2: 98%  Weight: 123 lb 3.2 oz (55.9 kg)  Height: 5\' 2"  (1.575 m)   Body mass index is 22.53 kg/m.  Wt Readings from Last 3 Encounters:  08/24/23 123 lb 3.2 oz (55.9 kg)  07/09/23 126 lb (57.2 kg)  06/29/23 125 lb 9.6 oz (57 kg)     Objective:  Physical Exam Vitals and nursing note reviewed.  Constitutional:      Appearance: Normal appearance.  HENT:     Head: Normocephalic and atraumatic.     Right Ear: Tympanic membrane, ear canal and external ear normal.     Left Ear: Tympanic membrane, ear canal and external ear normal.     Nose: Nose normal.     Mouth/Throat:     Mouth: Mucous membranes are moist.      Pharynx: Oropharynx is clear.  Eyes:     Extraocular Movements: Extraocular movements intact.     Conjunctiva/sclera: Conjunctivae normal.     Pupils: Pupils are equal, round, and reactive to light.  Cardiovascular:  Rate and Rhythm: Normal rate and regular rhythm.     Pulses: Normal pulses.     Heart sounds: Normal heart sounds.  Pulmonary:     Effort: Pulmonary effort is normal.     Breath sounds: Normal breath sounds.  Chest:     Comments: Healed keloid scar on left Breast skin thickening on the right in areolar area Abdominal:     General: Abdomen is flat. Bowel sounds are normal.     Palpations: Abdomen is soft.     Tenderness: There is no abdominal tenderness.  Genitourinary:    Comments: deferred Musculoskeletal:        General: Normal range of motion.     Cervical back: Normal range of motion and neck supple.  Skin:    General: Skin is warm and dry.  Neurological:     General: No focal deficit present.     Mental Status: She is alert and oriented to person, place, and time.  Psychiatric:        Mood and Affect: Mood normal.        Behavior: Behavior normal.         Assessment And Plan:     Encounter for annual health examination Assessment & Plan: A full exam was performed.  Importance of monthly self breast exams was discussed with the patient.  She is advised to get 3045 minutes of regular exercise, no less than four to five days per week. Both weight-bearing and aerobic exercises are recommended.  She is advised to follow a healthy diet with at least six fruits/veggies per day, decrease intake of red meat and other saturated fats and to increase fish intake to twice weekly.  Meats/fish should not be fried -- baked, boiled or broiled is preferable. It is also important to cut back on your sugar intake.  Be sure to read labels - try to avoid anything with added sugar, high fructose corn syrup or other sweeteners.  If you must use a sweetener, you can try stevia or  monkfruit.  It is also important to avoid artificially sweetened foods/beverages and diet drinks. Lastly, wear SPF 50 sunscreen on exposed skin and when in direct sunlight for an extended period of time.  Be sure to avoid fast food restaurants and aim for at least 60 ounces of water  daily.       Type 2 diabetes mellitus with diabetic peripheral angiopathy without gangrene, without long-term current use of insulin  (HCC) Assessment & Plan: Type 2 diabetes mellitus with recent blood glucose 120-140 mg/dL, J1B 1.4%, good control except during dietary indiscretions. Diabetic foot exam was performed.  - Encourage addition of cinnamon to tea. - Continue current diabetes management plan. - Continue with glimepiride  4mg  daily and Januvia  100mg  daily  Orders: -     CMP14+EGFR -     Lipid panel -     POCT urinalysis dipstick -     Microalbumin / creatinine urine ratio -     EKG 12-Lead -     Hemoglobin A1c  Hypertensive heart disease without heart failure Assessment & Plan: Chronic, controlled.  EKG performed, SB w/ nonspecific  T-abnormality.   For now, she will continue with amlodipine  10mg  daily, olmesartan  40mg  daily,  and metoprolol  Xl 25mg  1/2 tab daily.  She is encouraged to follow low sodium diet. No med changes today. Reminded to avoid packaged foods and processed meats which tend to be high in sodium.   Orders: -     CMP14+EGFR -  Lipid panel -     POCT urinalysis dipstick -     Microalbumin / creatinine urine ratio -     EKG 12-Lead  Coronary artery disease involving native coronary artery of native heart with angina pectoris [facility]) Assessment & Plan: Chronic, status post stent placement in 2023.  She will c/w ASA, B-blocker and statin therapy. She is encouraged to follow a heart healthy diet.    Chronic midline low back pain without sciatica Assessment & Plan: Chronic lower back pain likely due to arthritis, pain severity 5-8/10, no radicular symptoms. - Refer to orthopedic  specialist for evaluation. - Recommend use of gardening benches to reduce strain. - Continue use of pain patches as needed. - Order x-ray - Use Salonpas patches prn  Orders: -     Ambulatory referral to Home Health  Gastroesophageal reflux disease without esophagitis Assessment & Plan: GERD managed with pantoprazole , occasional dull abdominal discomfort not related to meals. - Continue pantoprazole  40 mg daily - Stop eating 3 hrs prior to lying down - Avoid known triggers   Epigastric abdominal pain Assessment & Plan: Consider imaging if sx persist - Possibly related to GERD - Eat small, frequent meals - Avoid known triggers - Keep food diary to see if there is a pattern   Imbalance Assessment & Plan: Reports imbalance and falls during gardening, no inner ear issues. - Refer to physical therapy to improve balance. - Monitor for further episodes.  Orders: -     Ambulatory referral to Neurology -     Ambulatory referral to Home Health  Paresthesia of hand Assessment & Plan: - Consider nerve conduction study of upper extremities.  Orders: -     Ambulatory referral to Neurology  Claustrophobia Assessment & Plan: Chronic, she is hesitant to get brain imaging due to her h/o claustrophobia. - Consider low dose Valium (2mg ) 1 hr prior to procedure   Other orders -     TSH -     Specimen status report    Return for 1 year physical, 4 month DM. Patient was given opportunity to ask questions. Patient verbalized understanding of the plan and was able to repeat key elements of the plan. All questions were answered to their satisfaction.   I, Smiley Dung, MD, have reviewed all documentation for this visit. The documentation on 08/24/23 for the exam, diagnosis, procedures, and orders are all accurate and complete.

## 2023-08-26 LAB — CMP14+EGFR
ALT: 27 IU/L (ref 0–32)
AST: 27 IU/L (ref 0–40)
Albumin: 4.5 g/dL (ref 3.7–4.7)
Alkaline Phosphatase: 94 IU/L (ref 44–121)
BUN/Creatinine Ratio: 15 (ref 12–28)
BUN: 11 mg/dL (ref 8–27)
Bilirubin Total: 0.7 mg/dL (ref 0.0–1.2)
CO2: 24 mmol/L (ref 20–29)
Calcium: 9.9 mg/dL (ref 8.7–10.3)
Chloride: 102 mmol/L (ref 96–106)
Creatinine, Ser: 0.75 mg/dL (ref 0.57–1.00)
Globulin, Total: 2.4 g/dL (ref 1.5–4.5)
Glucose: 167 mg/dL — ABNORMAL HIGH (ref 70–99)
Potassium: 4.3 mmol/L (ref 3.5–5.2)
Sodium: 140 mmol/L (ref 134–144)
Total Protein: 6.9 g/dL (ref 6.0–8.5)
eGFR: 80 mL/min/{1.73_m2} (ref >59)

## 2023-08-26 LAB — LIPID PANEL
Chol/HDL Ratio: 1.8 ratio (ref 0.0–4.4)
Cholesterol, Total: 183 mg/dL (ref 100–199)
HDL: 104 mg/dL (ref >39)
LDL Chol Calc (NIH): 70 mg/dL (ref 0–99)
Triglycerides: 44 mg/dL (ref 0–149)
VLDL Cholesterol Cal: 9 mg/dL (ref 5–40)

## 2023-08-26 LAB — HEMOGLOBIN A1C
Est. average glucose Bld gHb Est-mCnc: 143 mg/dL
Hgb A1c MFr Bld: 6.6 % — ABNORMAL HIGH (ref 4.8–5.6)

## 2023-08-26 LAB — MICROALBUMIN / CREATININE URINE RATIO
Creatinine, Urine: 70.5 mg/dL
Microalb/Creat Ratio: 23 mg/g{creat} (ref 0–29)
Microalbumin, Urine: 16.3 ug/mL

## 2023-08-27 DIAGNOSIS — R2689 Other abnormalities of gait and mobility: Secondary | ICD-10-CM | POA: Insufficient documentation

## 2023-08-27 DIAGNOSIS — R1013 Epigastric pain: Secondary | ICD-10-CM | POA: Insufficient documentation

## 2023-08-28 LAB — SPECIMEN STATUS REPORT

## 2023-08-28 LAB — TSH: TSH: 1.55 u[IU]/mL (ref 0.450–4.500)

## 2023-08-30 DIAGNOSIS — F40240 Claustrophobia: Secondary | ICD-10-CM | POA: Insufficient documentation

## 2023-08-30 NOTE — Assessment & Plan Note (Signed)
 Consider imaging if sx persist - Possibly related to GERD - Eat small, frequent meals - Avoid known triggers - Keep food diary to see if there is a pattern

## 2023-08-30 NOTE — Assessment & Plan Note (Signed)
 Chronic lower back pain likely due to arthritis, pain severity 5-8/10, no radicular symptoms. - Refer to orthopedic specialist for evaluation. - Recommend use of gardening benches to reduce strain. - Continue use of pain patches as needed. - Order x-ray - Use Salonpas patches prn

## 2023-08-30 NOTE — Assessment & Plan Note (Signed)
-   Consider nerve conduction study of upper extremities.

## 2023-08-30 NOTE — Assessment & Plan Note (Signed)
 Chronic, she is hesitant to get brain imaging due to her h/o claustrophobia. - Consider low dose Valium (2mg ) 1 hr prior to procedure

## 2023-08-30 NOTE — Assessment & Plan Note (Signed)
 Chronic, status post stent placement in 2023.  She will c/w ASA, B-blocker and statin therapy. She is encouraged to follow a heart healthy diet.

## 2023-08-30 NOTE — Assessment & Plan Note (Signed)
 Reports imbalance and falls during gardening, no inner ear issues. - Refer to physical therapy to improve balance. - Monitor for further episodes.

## 2023-08-30 NOTE — Assessment & Plan Note (Signed)
 Type 2 diabetes mellitus with recent blood glucose 120-140 mg/dL, X9J 4.7%, good control except during dietary indiscretions. Diabetic foot exam was performed.  - Encourage addition of cinnamon to tea. - Continue current diabetes management plan. - Continue with glimepiride  4mg  daily and Januvia  100mg  daily

## 2023-08-30 NOTE — Assessment & Plan Note (Signed)
 GERD managed with pantoprazole , occasional dull abdominal discomfort not related to meals. - Continue pantoprazole  40 mg daily - Stop eating 3 hrs prior to lying down - Avoid known triggers

## 2023-08-30 NOTE — Assessment & Plan Note (Signed)
 Chronic, controlled.  EKG performed, SB w/ nonspecific  T-abnormality.   For now, she will continue with amlodipine  10mg  daily, olmesartan  40mg  daily,  and metoprolol  Xl 25mg  1/2 tab daily.  She is encouraged to follow low sodium diet. No med changes today. Reminded to avoid packaged foods and processed meats which tend to be high in sodium.

## 2023-08-31 DIAGNOSIS — H401131 Primary open-angle glaucoma, bilateral, mild stage: Secondary | ICD-10-CM | POA: Diagnosis not present

## 2023-09-11 ENCOUNTER — Institutional Professional Consult (permissible substitution): Admitting: Plastic Surgery

## 2023-09-21 DIAGNOSIS — E1142 Type 2 diabetes mellitus with diabetic polyneuropathy: Secondary | ICD-10-CM | POA: Diagnosis not present

## 2023-09-21 DIAGNOSIS — I10 Essential (primary) hypertension: Secondary | ICD-10-CM | POA: Diagnosis not present

## 2023-09-22 ENCOUNTER — Institutional Professional Consult (permissible substitution): Payer: Self-pay | Admitting: Plastic Surgery

## 2023-09-22 DIAGNOSIS — E1142 Type 2 diabetes mellitus with diabetic polyneuropathy: Secondary | ICD-10-CM | POA: Diagnosis not present

## 2023-09-22 DIAGNOSIS — I10 Essential (primary) hypertension: Secondary | ICD-10-CM | POA: Diagnosis not present

## 2023-10-01 ENCOUNTER — Telehealth: Payer: Self-pay | Admitting: Plastic Surgery

## 2023-10-01 NOTE — Telephone Encounter (Signed)
 Lvmail to r/s provider out of office [phone]

## 2023-10-08 DIAGNOSIS — M19241 Secondary osteoarthritis, right hand: Secondary | ICD-10-CM | POA: Diagnosis not present

## 2023-10-08 DIAGNOSIS — N181 Chronic kidney disease, stage 1: Secondary | ICD-10-CM | POA: Diagnosis not present

## 2023-10-08 DIAGNOSIS — E119 Type 2 diabetes mellitus without complications: Secondary | ICD-10-CM | POA: Diagnosis not present

## 2023-10-08 DIAGNOSIS — E785 Hyperlipidemia, unspecified: Secondary | ICD-10-CM | POA: Diagnosis not present

## 2023-10-08 DIAGNOSIS — M19242 Secondary osteoarthritis, left hand: Secondary | ICD-10-CM | POA: Diagnosis not present

## 2023-10-08 DIAGNOSIS — R2689 Other abnormalities of gait and mobility: Secondary | ICD-10-CM | POA: Diagnosis not present

## 2023-10-08 DIAGNOSIS — M6281 Muscle weakness (generalized): Secondary | ICD-10-CM | POA: Diagnosis not present

## 2023-10-08 DIAGNOSIS — M545 Low back pain, unspecified: Secondary | ICD-10-CM | POA: Diagnosis not present

## 2023-10-08 DIAGNOSIS — Z9181 History of falling: Secondary | ICD-10-CM | POA: Diagnosis not present

## 2023-10-08 DIAGNOSIS — I10 Essential (primary) hypertension: Secondary | ICD-10-CM | POA: Diagnosis not present

## 2023-10-14 DIAGNOSIS — Z9181 History of falling: Secondary | ICD-10-CM | POA: Diagnosis not present

## 2023-10-14 DIAGNOSIS — M545 Low back pain, unspecified: Secondary | ICD-10-CM | POA: Diagnosis not present

## 2023-10-14 DIAGNOSIS — N181 Chronic kidney disease, stage 1: Secondary | ICD-10-CM | POA: Diagnosis not present

## 2023-10-14 DIAGNOSIS — R2689 Other abnormalities of gait and mobility: Secondary | ICD-10-CM | POA: Diagnosis not present

## 2023-10-14 DIAGNOSIS — E119 Type 2 diabetes mellitus without complications: Secondary | ICD-10-CM | POA: Diagnosis not present

## 2023-10-14 DIAGNOSIS — I10 Essential (primary) hypertension: Secondary | ICD-10-CM | POA: Diagnosis not present

## 2023-10-14 DIAGNOSIS — M19241 Secondary osteoarthritis, right hand: Secondary | ICD-10-CM | POA: Diagnosis not present

## 2023-10-14 DIAGNOSIS — M6281 Muscle weakness (generalized): Secondary | ICD-10-CM | POA: Diagnosis not present

## 2023-10-14 DIAGNOSIS — M19242 Secondary osteoarthritis, left hand: Secondary | ICD-10-CM | POA: Diagnosis not present

## 2023-10-14 DIAGNOSIS — E785 Hyperlipidemia, unspecified: Secondary | ICD-10-CM | POA: Diagnosis not present

## 2023-10-16 DIAGNOSIS — M545 Low back pain, unspecified: Secondary | ICD-10-CM | POA: Diagnosis not present

## 2023-10-16 DIAGNOSIS — I10 Essential (primary) hypertension: Secondary | ICD-10-CM | POA: Diagnosis not present

## 2023-10-16 DIAGNOSIS — E119 Type 2 diabetes mellitus without complications: Secondary | ICD-10-CM | POA: Diagnosis not present

## 2023-10-16 DIAGNOSIS — N181 Chronic kidney disease, stage 1: Secondary | ICD-10-CM | POA: Diagnosis not present

## 2023-10-16 DIAGNOSIS — M19242 Secondary osteoarthritis, left hand: Secondary | ICD-10-CM | POA: Diagnosis not present

## 2023-10-16 DIAGNOSIS — Z9181 History of falling: Secondary | ICD-10-CM | POA: Diagnosis not present

## 2023-10-16 DIAGNOSIS — E785 Hyperlipidemia, unspecified: Secondary | ICD-10-CM | POA: Diagnosis not present

## 2023-10-16 DIAGNOSIS — M19241 Secondary osteoarthritis, right hand: Secondary | ICD-10-CM | POA: Diagnosis not present

## 2023-10-16 DIAGNOSIS — R2689 Other abnormalities of gait and mobility: Secondary | ICD-10-CM | POA: Diagnosis not present

## 2023-10-16 DIAGNOSIS — M6281 Muscle weakness (generalized): Secondary | ICD-10-CM | POA: Diagnosis not present

## 2023-10-20 DIAGNOSIS — M545 Low back pain, unspecified: Secondary | ICD-10-CM | POA: Diagnosis not present

## 2023-10-20 DIAGNOSIS — E119 Type 2 diabetes mellitus without complications: Secondary | ICD-10-CM | POA: Diagnosis not present

## 2023-10-20 DIAGNOSIS — M19242 Secondary osteoarthritis, left hand: Secondary | ICD-10-CM | POA: Diagnosis not present

## 2023-10-20 DIAGNOSIS — N181 Chronic kidney disease, stage 1: Secondary | ICD-10-CM | POA: Diagnosis not present

## 2023-10-20 DIAGNOSIS — R2689 Other abnormalities of gait and mobility: Secondary | ICD-10-CM | POA: Diagnosis not present

## 2023-10-20 DIAGNOSIS — I10 Essential (primary) hypertension: Secondary | ICD-10-CM | POA: Diagnosis not present

## 2023-10-20 DIAGNOSIS — Z9181 History of falling: Secondary | ICD-10-CM | POA: Diagnosis not present

## 2023-10-20 DIAGNOSIS — E785 Hyperlipidemia, unspecified: Secondary | ICD-10-CM | POA: Diagnosis not present

## 2023-10-20 DIAGNOSIS — M6281 Muscle weakness (generalized): Secondary | ICD-10-CM | POA: Diagnosis not present

## 2023-10-20 DIAGNOSIS — M19241 Secondary osteoarthritis, right hand: Secondary | ICD-10-CM | POA: Diagnosis not present

## 2023-10-22 ENCOUNTER — Other Ambulatory Visit (INDEPENDENT_AMBULATORY_CARE_PROVIDER_SITE_OTHER): Admitting: Internal Medicine

## 2023-10-22 DIAGNOSIS — M19241 Secondary osteoarthritis, right hand: Secondary | ICD-10-CM

## 2023-10-22 DIAGNOSIS — M8589 Other specified disorders of bone density and structure, multiple sites: Secondary | ICD-10-CM | POA: Diagnosis not present

## 2023-10-22 DIAGNOSIS — M19242 Secondary osteoarthritis, left hand: Secondary | ICD-10-CM | POA: Diagnosis not present

## 2023-10-22 DIAGNOSIS — I119 Hypertensive heart disease without heart failure: Secondary | ICD-10-CM | POA: Diagnosis not present

## 2023-10-22 DIAGNOSIS — I10 Essential (primary) hypertension: Secondary | ICD-10-CM | POA: Diagnosis not present

## 2023-10-22 DIAGNOSIS — I739 Peripheral vascular disease, unspecified: Secondary | ICD-10-CM | POA: Diagnosis not present

## 2023-10-22 DIAGNOSIS — K219 Gastro-esophageal reflux disease without esophagitis: Secondary | ICD-10-CM

## 2023-10-22 DIAGNOSIS — E1142 Type 2 diabetes mellitus with diabetic polyneuropathy: Secondary | ICD-10-CM | POA: Diagnosis not present

## 2023-10-22 NOTE — Progress Notes (Signed)
 CC: Home certification  Received home health orders orders from [facility]. Start of care 10/08/23.   Certification and orders from 10/08/23 through 12/06/23 are reviewed, signed and faxed back to home health company.  Need of intermittent skilled services at home: PT  The home health care plan has been established by me and will be reviewed and updated as needed to maximize patient recovery.  I certify that all home health services have been and will be furnished to the patient while under my care.  Face-to-face encounter in which the need for home health services was established: 08/24/23.  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

## 2023-10-23 ENCOUNTER — Other Ambulatory Visit: Payer: Self-pay

## 2023-10-23 DIAGNOSIS — C50211 Malignant neoplasm of upper-inner quadrant of right female breast: Secondary | ICD-10-CM

## 2023-10-25 NOTE — Assessment & Plan Note (Addendum)
 01/09/2022 - screening mammogram negative  01/10/2023 -3D screening mammogram negative.  Patient taking anastrozole  1 mg daily Continues calcium  and vitamin D  along with low impact exercise every day. 04/27/2023 - breast exam benign except for  keloids present on lumpectomy scar of upper mid portion of the left breast. This is tender. There are radiation changes present of right breast.  -She has scheduled appointment with Dr. Lowery in near future to discuss treatment.  -continue anastrozole  daily. Refill provided.  -Mammogram expected in October 2025, ordered as part of today's visit.   --labs with follow up in 6 months. Sooner if needed.

## 2023-10-25 NOTE — Progress Notes (Signed)
 Patient Care Team: [name], MD as PCP - General (Internal Medicine) [name], Jerel, MD as PCP - Cardiology (Cardiology) Glean Stephane BROCKS, RN (Inactive) as Oncology Nurse Navigator Tyree Nanetta SAILOR, RN as Oncology Nurse Navigator Lanny Callander, MD as Consulting Physician (Hematology) Curvin Deward MOULD, MD as Consulting Physician (General Surgery) Shannon Agent, MD as Consulting Physician (Radiation Oncology) Burton, Elizabeth K, NP as Nurse Practitioner (Nurse Practitioner) Arnell Rockney PARAS, RPH (Inactive) (Pharmacist) [place], Yazlyn Wentzel, OD (Optometry)  Clinic Day:  11/01/2023  Referring physician: [name], MD  ASSESSMENT & PLAN:   Assessment & Plan: Malignant neoplasm of upper-inner quadrant of right breast in female, estrogen receptor positive (HCC) 01/09/2022 - screening mammogram negative  01/10/2023 -3D screening mammogram negative.  Patient taking anastrozole  1 mg daily Continues calcium  and vitamin D  along with low impact exercise every day. 04/27/2023 - breast exam benign except for  keloids present on lumpectomy scar of upper mid portion of the left breast. This is tender. There are radiation changes present of right breast. Will make appointment with Dr. Curvin, surgeon, for further evaluation.  -labs with follow up in 6 months. Sooner if needed. Mammogram expected in October 2025.     Keloid scar Present in upper inner quadrant of the left breast, adjacent to the chest.  Tender to palpate.  Does have scheduled appointment with plastic surgeon Dr. Lowery to discuss treatment.  Increased stress. Patient reports great deal of family stress related to several deaths in her family.  Appetite has recently decreased due to stress.  Has lost a couple of pounds since last visit.  Labile blood sugars States that blood sugars have been up-and-down recently.  Less likely related to increased stress.  This is managed per primary care.  Plan Labs reviewed. -CBC and CMP are  unremarkable. Due for mammogram in October 2025.  This will be ordered as part of today's visit. Continue anastrozole  daily.  Will send refill. Labs and follow-up in 6 months, sooner if needed.  The patient understands the plans discussed today and is in agreement with them.  She knows to contact our office if she develops concerns prior to her next appointment.  I provided 25 minutes of face-to-face time during this encounter and > 50% was spent counseling as documented under my assessment and plan.    Powell FORBES Lessen, NP  [facility] - A DEPT OF [facility] [address] [place] [address] Dept: [phone] Dept Fax: [phone]   No orders of the defined types were placed in this encounter.     CHIEF COMPLAINT:  CC: Right breast cancer, estrogen receptor positive  Current Treatment: Anastrozole  1 mg daily  INTERVAL HISTORY:  Elizabeth Mcfarland is here today for repeat clinical assessment.  She was last seen by me on 04/27/2023.  States she has been doing okay since last visit.  Does have increased amount of stress.  Has had several deaths in her family in past 6 months.  This affects her appetite.  She is also having labile blood sugars.  She denies changes or new masses in either breast.  She denies chest pain, chest pressure, or shortness of breath. She denies headaches or visual disturbances. She denies abdominal pain, nausea, vomiting, or changes in bowel or bladder habits.   She denies fevers or chills. She denies pain. Her appetite is good.  She states she stays hydrated and active.  Her weight has been stable.  I have reviewed the past medical history, past surgical history, social history and family history with the patient and they are unchanged from previous note.  ALLERGIES:  is allergic to codeine and percocet [oxycodone-acetaminophen ].  MEDICATIONS:  Current Outpatient Medications  Medication Sig Dispense  Refill   acetaminophen  (TYLENOL ) 500 MG tablet Take 1,000 mg by mouth every 6 (six) hours as needed for mild pain (pain score 1-3) or moderate pain (pain score 4-6).     amLODipine  (NORVASC ) 10 MG tablet TAKE 1 TABLET (10MG ) BY MOUTH DAILY. 90 tablet 1   anastrozole  (ARIMIDEX ) 1 MG tablet TAKE 1 TABLET BY MOUTH DAILY. 90 tablet 1   Ascorbic Acid (VITAMIN C) 1000 MG tablet Take 1,000 mg by mouth daily.     aspirin  EC 81 MG tablet Take 81 mg by mouth daily.     atorvastatin  (LIPITOR ) 80 MG tablet Take 1 tablet (80 mg total) by mouth daily. 90 tablet 3   cholecalciferol (VITAMIN D ) 1000 UNITS tablet Take 1,000 Units by mouth daily.     dorzolamide -timolol  (COSOPT ) 2-0.5 % ophthalmic solution Place 1 drop into both eyes 2 (two) times daily.     glimepiride  (AMARYL ) 4 MG tablet Take 1 tablet (4 mg total) by mouth every morning. 90 tablet 1   Multiple Vitamins-Minerals (CENTRUM SILVER 50+WOMEN PO) Take 1 tablet by mouth daily.     nitroGLYCERIN  (NITROSTAT ) 0.4 MG SL tablet Place 1 tablet (0.4 mg total) under the tongue every 5 (five) minutes as needed for chest pain. 25 tablet 1   olmesartan  (BENICAR ) 40 MG tablet TAKE 1 TABLET(40 MG) BY MOUTH DAILY 90 tablet 1   ONETOUCH ULTRA test strip USE AS DIRECTED TWICE DAILY 100 strip 3   pantoprazole  (PROTONIX ) 40 MG tablet Take 1 tablet (40 mg total) by mouth daily. 90 tablet 2   Polyethyl Glycol-Propyl Glycol (SYSTANE OP) Place 1 drop into both eyes 2 (two) times daily.     polyethylene glycol (MIRALAX  / GLYCOLAX ) packet Take 17 g by mouth daily.      sitaGLIPtin  (JANUVIA ) 100 MG tablet TAKE 1 TABLET ( 100MG ) BY MOUTH DAILY. 90 tablet 2   No current facility-administered medications for this visit.    HISTORY OF PRESENT ILLNESS:   Oncology History Overview Note  Cancer Staging Malignant neoplasm of upper-inner quadrant of right breast in female, estrogen receptor positive (HCC) Staging form: Breast, AJCC 8th Edition - Clinical stage from 10/07/2018:  cT2, cN0, cM0, GX, ER+, PR+, HER2: Equivocal - Signed by Lanny Callander, MD on 10/12/2018 - Pathologic stage from 12/25/2018: Stage IA (pT2, pN0, cM0, G2, ER+, PR+, HER2-, Oncotype DX score: 21) - Signed by Lanny Callander, MD on 02/01/2019    Malignant neoplasm of upper-inner quadrant of right breast in female, estrogen receptor positive (HCC)  10/01/2018 Imaging   Bone Density Scan 10/01/18  IMPRESSION Lowest T-score of -0.9 at left femur neck (NORMAL) T-score DualFemur Neck Left  10/01/2018    76.1         -0.9    0.916 g/cm2   AP Spine  L1-L4      10/01/2018    76.1         1.2     1.326 g/cm2   DualFemur Total Mean 10/01/2018    76.1         0.1     1.024 g/cm2   10/05/2018 Mammogram   Diagnostic Mammogram 10/05/18 IMPRESSION: 1. Highly suspicious right breast mass 1 o'clock position 2 cm from  the nipple on the right. It measures 2.1 x 1.7 x 1.5 cm. Corresponding with the screening mammographic findings. Recommendation is for ultrasound-guided biopsy. 2. Indeterminate left breast mass at the 10 o'clock position 9 cm from the nipple. It measures 1.3 x 0.5 x 0.3 cm. This may represent a benign etiology such as a degenerating fibroadenoma. However, ultrasound-guided biopsy for definitive tissue diagnosis is recommended. 3. No suspicious lymphadenopathy bilaterally.   10/07/2018 Cancer Staging   Staging form: Breast, AJCC 8th Edition - Clinical stage from 10/07/2018: cT2, cN0, cM0, GX, ER+, PR+, HER2: Equivocal - Signed by Lanny Callander, MD on 10/12/2018   10/07/2018 Initial Biopsy   Diagnosis 10/07/18 1. Breast, right, needle core biopsy, 1 o'clock, 2cmfn - INVASIVE MAMMARY CARCINOMA WITH CALCIFICATIONS. SEE NOTE 2. Breast, left, needle core biopsy, 10 o'clock - FIBROADENOMA WITH DYSTROPHIC CALCIFICATIONS - NEGATIVE FOR CARCINOMA   10/07/2018 Receptors her2   By immunohistochemistry, the tumor cells are EQUIVOCAL for Her2 (2+). HER2 by FISH will be PERFORMED and the RESULTS REPORTED  SEPARATELY Estrogen Receptor: 100%, POSITIVE, STRONG STAINING INTENSITY Progesterone Receptor: 70%, POSITIVE, STRONG STAINING INTENSITY Proliferation Marker Ki67: 10%   10/11/2018 Initial Diagnosis   Malignant neoplasm of upper-inner quadrant of right breast in female, estrogen receptor positive (HCC)   10/2018 -  Neo-Adjuvant Anti-estrogen oral therapy   She started anastrozole  end of 10/2018 before surgery.   11/25/2018 Surgery   BILATERAL BREAST LUMPECTOMIES  WITH BILATERAL  RADIOACTIVE SEEDS AND RIGHT SENTINEL LYMPH NODE BIOPSY by Dr Curvin  11/24/28   11/25/2018 Pathology Results   Diagnosis 11/25/18 1. Breast, lumpectomy, Left w/seed - FIBROADENOMA WITH CALCIFICATIONS. - BIOPSY SITE CHANGES. 2. Lymph node, sentinel, biopsy, Right Axillary #1 - ONE LYMPH NODE, NEGATIVE FOR CARCINOMA (0/1). 3. Lymph node, sentinel, biopsy, Right Axillary #2 - ONE LYMPH NODE, NEGATIVE FOR CARCINOMA (0/1). 4. Lymph node, sentinel, biopsy, Right - ONE LYMPH NODE, NEGATIVE FOR CARCINOMA (0/1). 5. Breast, lumpectomy, Right w/seed - INVASIVE DUCTAL CARCINOMA, 2.6 CM, NOTTINGHAM GRADE 2 OF 3. - MARGINS OF RESECTION ARE NOT INVOLVED (CLOSEST MARGIN: LESS THAN 1 MM, SUPERIOR). - BIOPSY SITE CHANGES. - SEE ONCOLOGY TABLE. 6. Medical device, removal, radioactive seed - RADIOACTIVE SEED. - GROSS ONLY.   11/25/2018 Oncotype testing   Oncotype 11/25/18 Recurrence Score 21 with distant recurrence risk at 9 years of with Tamoxifen alone 7%.  There is less than 1% benefit from chemo.    12/25/2018 Cancer Staging   Staging form: Breast, AJCC 8th Edition - Pathologic stage from 12/25/2018: Stage IA (pT2, pN0, cM0, G2, ER+, PR+, HER2-, Oncotype DX score: 21) - Signed by Lanny Callander, MD on 02/01/2019   01/11/2019 - 02/08/2019 Radiation Therapy   Adjuvant Radiation with Dr Shannon 01/11/19-02/08/19   05/05/2019 Survivorship   SCP delivered by Elizabeth Burton, NP        REVIEW OF SYSTEMS:   Constitutional: Denies fevers,  chills or abnormal weight loss Eyes: Denies blurriness of vision Ears, nose, mouth, throat, and face: Denies mucositis or sore throat Respiratory: Denies cough, dyspnea or wheezes Cardiovascular: Denies palpitation, chest discomfort or lower extremity swelling Gastrointestinal:  Denies nausea, heartburn or change in bowel habits Skin: Denies abnormal skin rashes Lymphatics: Denies new lymphadenopathy or easy bruising Neurological:Denies numbness, tingling or new weaknesses Behavioral/Psych: Mood is stable, no new changes. Increased stress.  All other systems were reviewed with the patient and are negative.   VITALS:   Today's Vitals   10/26/23 1100 10/26/23 1129  BP: (!) 154/86 137/76  Pulse: 68   Resp: 17   Temp: 97.7 F (36.5 C)   TempSrc: Temporal   SpO2: 98%   Weight: 122 lb 4.8 oz (55.5 kg)   PainSc: 5     Body mass index is 22.37 kg/m.   Wt Readings from Last 3 Encounters:  10/27/23 121 lb (54.9 kg)  10/26/23 122 lb 4.8 oz (55.5 kg)  08/24/23 123 lb 3.2 oz (55.9 kg)    Body mass index is 22.37 kg/m.  Performance status (ECOG): 1 - Symptomatic but completely ambulatory  PHYSICAL EXAM:   GENERAL:alert, no distress and comfortable SKIN: skin color, texture, turgor are normal, no rashes or significant lesions EYES: normal, Conjunctiva are pink and non-injected, sclera clear OROPHARYNX:no exudate, no erythema and lips, buccal mucosa, and tongue normal  NECK: supple, thyroid  normal size, non-tender, without nodularity LYMPH:  no palpable lymphadenopathy in the cervical, axillary or inguinal LUNGS: clear to auscultation and percussion with normal breathing effort HEART: regular rate & rhythm and no murmurs and no lower extremity edema ABDOMEN:abdomen soft, non-tender and normal bowel sounds Musculoskeletal:no cyanosis of digits and no clubbing  NEURO: alert & oriented x 3 with fluent speech, no focal motor/sensory deficits BREAST: There is tender, keloid scar in  upper inner quadrant of left breast, adjacent to mid chest.  This is associated with previous lumpectomy scar.  There are no palpable masses or lumps send left breast.  There is expected skin changes related to prior radiation therapy.  There is no nipple inversion or nipple discharge.  There is no axillary lymphadenopathy on the left side.  The right breast is without palpable lumps or masses.  There is no nipple inversion or nipple discharge.  There is no axillary lymphadenopathy on the right.   LABORATORY DATA:  I have reviewed the data as listed    Component Value Date/Time   NA 141 10/26/2023 1028   NA 140 08/24/2023 1101   [place]cfarland 3.7 10/26/2023 1028   CL 103 10/26/2023 1028   CO2 34 (H) 10/26/2023 1028   GLUCOSE 131 (H) 10/26/2023 1028   BUN 12 10/26/2023 1028   BUN 11 08/24/2023 1101   CREATININE 0.77 10/26/2023 1028   CALCIUM  9.6 10/26/2023 1028   PROT 6.9 10/26/2023 1028   PROT 6.9 08/24/2023 1101   ALBUMIN 4.3 10/26/2023 1028   ALBUMIN 4.5 08/24/2023 1101   AST 27 10/26/2023 1028   ALT 24 10/26/2023 1028   ALKPHOS 70 10/26/2023 1028   BILITOT 0.9 10/26/2023 1028   GFRNONAA >60 10/26/2023 1028   GFRAA 64 12/20/2019 1605   GFRAA >60 08/01/2019 0845     Lab Results  Component Value Date   WBC 6.7 10/26/2023   NEUTROABS 5.4 10/26/2023   HGB 12.0 10/26/2023   HCT 35.8 (L) 10/26/2023   MCV 91.1 10/26/2023   PLT 198 10/26/2023

## 2023-10-26 ENCOUNTER — Inpatient Hospital Stay (HOSPITAL_BASED_OUTPATIENT_CLINIC_OR_DEPARTMENT_OTHER): Payer: Medicare Other | Admitting: Nurse Practitioner

## 2023-10-26 ENCOUNTER — Inpatient Hospital Stay: Payer: Medicare Other | Attending: Nurse Practitioner

## 2023-10-26 VITALS — BP 137/76 | HR 68 | Temp 97.7°F | Resp 17 | Wt 122.3 lb

## 2023-10-26 DIAGNOSIS — Z7982 Long term (current) use of aspirin: Secondary | ICD-10-CM | POA: Diagnosis not present

## 2023-10-26 DIAGNOSIS — Z79899 Other long term (current) drug therapy: Secondary | ICD-10-CM | POA: Insufficient documentation

## 2023-10-26 DIAGNOSIS — C50211 Malignant neoplasm of upper-inner quadrant of right female breast: Secondary | ICD-10-CM | POA: Diagnosis not present

## 2023-10-26 DIAGNOSIS — Z79811 Long term (current) use of aromatase inhibitors: Secondary | ICD-10-CM | POA: Insufficient documentation

## 2023-10-26 DIAGNOSIS — Z170 Estrogen receptor positive status [ER+]: Secondary | ICD-10-CM

## 2023-10-26 DIAGNOSIS — L910 Hypertrophic scar: Secondary | ICD-10-CM | POA: Diagnosis not present

## 2023-10-26 DIAGNOSIS — Z7984 Long term (current) use of oral hypoglycemic drugs: Secondary | ICD-10-CM | POA: Insufficient documentation

## 2023-10-26 LAB — CBC WITH DIFFERENTIAL (CANCER CENTER ONLY)
Abs Immature Granulocytes: 0.02 K/uL (ref 0.00–0.07)
Basophils Absolute: 0 K/uL (ref 0.0–0.1)
Basophils Relative: 0 %
Eosinophils Absolute: 0.1 K/uL (ref 0.0–0.5)
Eosinophils Relative: 1 %
HCT: 35.8 % — ABNORMAL LOW (ref 36.0–46.0)
Hemoglobin: 12 g/dL (ref 12.0–15.0)
Immature Granulocytes: 0 %
Lymphocytes Relative: 12 %
Lymphs Abs: 0.8 K/uL (ref 0.7–4.0)
MCH: 30.5 pg (ref 26.0–34.0)
MCHC: 33.5 g/dL (ref 30.0–36.0)
MCV: 91.1 fL (ref 80.0–100.0)
Monocytes Absolute: 0.4 K/uL (ref 0.1–1.0)
Monocytes Relative: 5 %
Neutro Abs: 5.4 K/uL (ref 1.7–7.7)
Neutrophils Relative %: 82 %
Platelet Count: 198 K/uL (ref 150–400)
RBC: 3.93 MIL/uL (ref 3.87–5.11)
RDW: 12.1 % (ref 11.5–15.5)
WBC Count: 6.7 K/uL (ref 4.0–10.5)
nRBC: 0 % (ref 0.0–0.2)

## 2023-10-26 LAB — CMP (CANCER CENTER ONLY)
ALT: 24 U/L (ref 0–44)
AST: 27 U/L (ref 15–41)
Albumin: 4.3 g/dL (ref 3.5–5.0)
Alkaline Phosphatase: 70 U/L (ref 38–126)
Anion gap: 4 — ABNORMAL LOW (ref 5–15)
BUN: 12 mg/dL (ref 8–23)
CO2: 34 mmol/L — ABNORMAL HIGH (ref 22–32)
Calcium: 9.6 mg/dL (ref 8.9–10.3)
Chloride: 103 mmol/L (ref 98–111)
Creatinine: 0.77 mg/dL (ref 0.44–1.00)
GFR, Estimated: >60 mL/min (ref >60)
Glucose, Bld: 131 mg/dL — ABNORMAL HIGH (ref 70–99)
Potassium: 3.7 mmol/L (ref 3.5–5.1)
Sodium: 141 mmol/L (ref 135–145)
Total Bilirubin: 0.9 mg/dL (ref 0.0–1.2)
Total Protein: 6.9 g/dL (ref 6.5–8.1)

## 2023-10-27 ENCOUNTER — Ambulatory Visit: Admitting: Plastic Surgery

## 2023-10-27 VITALS — BP 157/69 | HR 74 | Ht 63.0 in | Wt 121.0 lb

## 2023-10-27 DIAGNOSIS — L910 Hypertrophic scar: Secondary | ICD-10-CM | POA: Diagnosis not present

## 2023-10-27 NOTE — Progress Notes (Signed)
 Patient ID: Elizabeth Mcfarland, female    DOB: 10-07-1940, 83 y.o.   MRN: [id number]   Chief Complaint  Patient presents with   Advice Only   Skin Problem    The patient is an 83 year old female here for evaluation of keloid on her left chest breast area.  The patient had some cyst removed from her breasts.  They scar has resulted.  The patient states that it really hurts.  She feels a lot of pulling and tenderness.  She has not had anything done to try to improve it as of yet.  There are 2 of them and they are about 1-1/2 cm in size on the left breast area.  There does not appear to be any infection.  They are firm and slightly tender to touch.    Review of Systems  Constitutional: Negative.   Eyes: Negative.   Respiratory: Negative.    Cardiovascular: Negative.   Gastrointestinal: Negative.   Endocrine: Negative.   Genitourinary: Negative.     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   Cataract    Chronic kidney disease    Diabetes mellitus without complication (HCC)    Esophageal ulcer with bleeding 05/05/2013   05/05/2013 EGD linear distal esophageal ulcer - cause of hematemesis prior to EGD    GERD (gastroesophageal reflux disease)    History of kidney stones    Hyperlipidemia    Hypertension    Kidney cysts    Personal history of radiation therapy    Sarcoidosis 1970    Past Surgical History:  Procedure Laterality Date   ABDOMINAL HYSTERECTOMY     BREAST BIOPSY Left 07/09/2021   BREAST EXCISIONAL BIOPSY Left 11/2018   Fibroadenoma   BREAST LUMPECTOMY     BREAST LUMPECTOMY WITH RADIOACTIVE SEED AND SENTINEL LYMPH NODE BIOPSY Bilateral 11/25/2018   Procedure: BILATERAL BREAST LUMPECTOMIES  WITH BILATERAL  RADIOACTIVE SEEDS AND RIGHT SENTINEL LYMPH NODE BIOPSY;  Surgeon: Curvin Deward MOULD, MD;  Location: [facility] OR;  Service: General;   Laterality: Bilateral;   CATARACT EXTRACTION W/ INTRAOCULAR LENS IMPLANT     COLONOSCOPY     COLONOSCOPY, ESOPHAGOGASTRODUODENOSCOPY (EGD) AND ESOPHAGEAL DILATION N/A 05/05/2013   Procedure: COLONOSCOPY, ESOPHAGOGASTRODUODENOSCOPY (EGD) AND ESOPHAGEAL DILATION (ED);  Surgeon: Lupita FORBES Commander, MD;  Location: [facility] ENDOSCOPY;  Service: Endoscopy;  Laterality: N/A;   LEFT HEART CATH AND CORONARY ANGIOGRAPHY N/A 04/08/2021   Procedure: LEFT HEART CATH AND CORONARY ANGIOGRAPHY;  Surgeon: Wonda Sharper, MD;  Location: [facility] INVASIVE CV LAB;  Service: Cardiovascular;  Laterality: N/A;   LITHOTRIPSY     POLYPECTOMY     UPPER GASTROINTESTINAL ENDOSCOPY        Current Outpatient Medications:    acetaminophen  (TYLENOL ) 500 MG tablet, Take 1,000 mg by mouth every 6 (six) hours as needed for mild pain (pain score 1-3) or moderate pain (pain score 4-6)., Disp: , Rfl:    amLODipine  (NORVASC ) 10 MG tablet, TAKE 1 TABLET (10MG ) BY MOUTH DAILY., Disp: 90 tablet, Rfl: 1   anastrozole  (ARIMIDEX ) 1 MG tablet, TAKE 1 TABLET BY MOUTH DAILY., Disp: 90 tablet, Rfl: 1   Ascorbic Acid (VITAMIN C) 1000 MG tablet, Take 1,000 mg by mouth daily., Disp: , Rfl:    aspirin  EC 81 MG tablet, Take 81 mg by mouth daily., Disp: , Rfl:  atorvastatin  (LIPITOR ) 80 MG tablet, Take 1 tablet (80 mg total) by mouth daily., Disp: 90 tablet, Rfl: 3   cholecalciferol (VITAMIN D ) 1000 UNITS tablet, Take 1,000 Units by mouth daily., Disp: , Rfl:    dorzolamide -timolol  (COSOPT ) 2-0.5 % ophthalmic solution, Place 1 drop into both eyes 2 (two) times daily., Disp: , Rfl:    glimepiride  (AMARYL ) 4 MG tablet, Take 1 tablet (4 mg total) by mouth every morning., Disp: 90 tablet, Rfl: 1   Multiple Vitamins-Minerals (CENTRUM SILVER 50+WOMEN PO), Take 1 tablet by mouth daily., Disp: , Rfl:    nitroGLYCERIN  (NITROSTAT ) 0.4 MG SL tablet, Place 1 tablet (0.4 mg total) under the tongue every 5 (five) minutes as needed for chest pain., Disp: 25 tablet, Rfl: 1    olmesartan  (BENICAR ) 40 MG tablet, TAKE 1 TABLET(40 MG) BY MOUTH DAILY, Disp: 90 tablet, Rfl: 1   ONETOUCH ULTRA test strip, USE AS DIRECTED TWICE DAILY, Disp: 100 strip, Rfl: 3   pantoprazole  (PROTONIX ) 40 MG tablet, Take 1 tablet (40 mg total) by mouth daily., Disp: 90 tablet, Rfl: 2   Polyethyl Glycol-Propyl Glycol (SYSTANE OP), Place 1 drop into both eyes 2 (two) times daily., Disp: , Rfl:    polyethylene glycol (MIRALAX  / GLYCOLAX ) packet, Take 17 g by mouth daily. , Disp: , Rfl:    sitaGLIPtin  (JANUVIA ) 100 MG tablet, TAKE 1 TABLET ( 100MG ) BY MOUTH DAILY., Disp: 90 tablet, Rfl: 2   Objective:   Vitals:   10/27/23 0830  BP: (!) 157/69  Pulse: 74  SpO2: 99%    Physical Exam Cardiovascular:     Rate and Rhythm: Normal rate.     Pulses: Normal pulses.  Pulmonary:     Effort: Pulmonary effort is normal.  Musculoskeletal:        General: No swelling.  Skin:    General: Skin is warm.     Capillary Refill: Capillary refill takes less than 2 seconds.     Coloration: Skin is not jaundiced.     Findings: Lesion present. No bruising.  Neurological:     Mental Status: She is oriented to person, place, and time.  Psychiatric:        Mood and Affect: Mood normal.        Behavior: Behavior normal.        Thought Content: Thought content normal.        Judgment: Judgment normal.     Assessment & Plan:  Keloid scar of skin  Kenalog suggested and patient agreed.  Kenalog 50/5 mg 0.2 cc was mixed with lidocaine  with epinephrine  0.1 cc.  The 2 lesions of the left breast were injected for a total of 3 cm.   Pictures were obtained of the patient and placed in the chart with the patient's or guardian's permission.  Estefana RAMAN Armarion Greek, DO

## 2023-10-28 ENCOUNTER — Telehealth: Payer: Self-pay | Admitting: Nurse Practitioner

## 2023-10-28 NOTE — Telephone Encounter (Signed)
Scheduled appointments with the patient

## 2023-11-01 ENCOUNTER — Encounter: Payer: Self-pay | Admitting: Nurse Practitioner

## 2023-11-01 MED ORDER — ANASTROZOLE 1 MG PO TABS: ORAL_TABLET | ORAL | 1 refills | Status: AC

## 2023-11-03 DIAGNOSIS — N181 Chronic kidney disease, stage 1: Secondary | ICD-10-CM | POA: Diagnosis not present

## 2023-11-03 DIAGNOSIS — M6281 Muscle weakness (generalized): Secondary | ICD-10-CM | POA: Diagnosis not present

## 2023-11-03 DIAGNOSIS — I10 Essential (primary) hypertension: Secondary | ICD-10-CM | POA: Diagnosis not present

## 2023-11-03 DIAGNOSIS — E785 Hyperlipidemia, unspecified: Secondary | ICD-10-CM | POA: Diagnosis not present

## 2023-11-03 DIAGNOSIS — M545 Low back pain, unspecified: Secondary | ICD-10-CM | POA: Diagnosis not present

## 2023-11-03 DIAGNOSIS — R2689 Other abnormalities of gait and mobility: Secondary | ICD-10-CM | POA: Diagnosis not present

## 2023-11-03 DIAGNOSIS — M19241 Secondary osteoarthritis, right hand: Secondary | ICD-10-CM | POA: Diagnosis not present

## 2023-11-03 DIAGNOSIS — M19242 Secondary osteoarthritis, left hand: Secondary | ICD-10-CM | POA: Diagnosis not present

## 2023-11-03 DIAGNOSIS — E119 Type 2 diabetes mellitus without complications: Secondary | ICD-10-CM | POA: Diagnosis not present

## 2023-11-03 DIAGNOSIS — Z9181 History of falling: Secondary | ICD-10-CM | POA: Diagnosis not present

## 2023-11-08 ENCOUNTER — Other Ambulatory Visit: Payer: Self-pay | Admitting: Internal Medicine

## 2023-11-19 ENCOUNTER — Ambulatory Visit: Payer: Self-pay

## 2023-11-19 NOTE — Telephone Encounter (Signed)
 FYI Only or Action Required?: Action required by provider: medication refill request.  Patient was last seen in primary care on 08/24/2023 by Elizabeth Medici, MD.  Called Nurse Triage reporting Medication Problem.  Symptoms began several days ago.  Interventions attempted: Prescription medications: Januvia .  Symptoms are: gradually worsening.  Triage Disposition: Call PCP Now  Patient/caregiver understands and will follow disposition?: Yes  Copied from CRM #[id number]. Topic: Clinical - Red Word Triage >> Nov 19, 2023  9:48 AM Elizabeth Mcfarland wrote: Red Word that prompted transfer to Nurse Triage: Patient is having issue getting her BP medication sitaGLIPtin  (JANUVIA ) 100 MG tablet. States because of that her BP and Blood sugar are all over the place. Yesterday her Blood sugar was 267 and today its down to 74 and her BP was 146/66 and this morning was 163/77. Both mornings she has been shaky. Reason for Disposition  [1] Prescription refill request for ESSENTIAL medicine (i.e., likelihood of harm to patient if not taken) AND [2] triager unable to refill per department policy  Answer Assessment - Initial Assessment Questions 1. DRUG NAME: What medicine do you need to have refilled?     Januvia   2. REFILLS REMAINING: How many refills are remaining? Notes: The label on the medicine or pill bottle will show how many refills are remaining. If there are no refills remaining, then a renewal may be needed.     0  3. EXPIRATION DATE: What is the expiration date? Note: The label states when the prescription will expire, and thus can no longer be refilled.)     Unsure  4. PRESCRIBER: Who prescribed it? Note: The prescribing doctor or group is responsible for refill approvals..     Dr. Jarold  5. PHARMACY: Have you contacted your pharmacy (drugstore)? Note: Some pharmacies will contact the doctor (or NP/PA).      Yes  6. SYMPTOMS: Do you have any symptoms?     Variations in Blood Sugars,  Elevated Blood Pressures  7. PREGNANCY: Is there any chance that you are pregnant? When was your last menstrual period?     No and No  The patient is requesting or inquiring if she can go on a medication she was on previously as she cannot afford Januvia  and she cannot skip her medication like this.  Protocols used: Medication Refill and Renewal Call-A-AH

## 2023-11-20 ENCOUNTER — Institutional Professional Consult (permissible substitution): Admitting: Plastic Surgery

## 2023-11-21 DIAGNOSIS — I10 Essential (primary) hypertension: Secondary | ICD-10-CM | POA: Diagnosis not present

## 2023-11-21 DIAGNOSIS — E1142 Type 2 diabetes mellitus with diabetic polyneuropathy: Secondary | ICD-10-CM | POA: Diagnosis not present

## 2023-11-22 DIAGNOSIS — I10 Essential (primary) hypertension: Secondary | ICD-10-CM | POA: Diagnosis not present

## 2023-11-22 DIAGNOSIS — E1142 Type 2 diabetes mellitus with diabetic polyneuropathy: Secondary | ICD-10-CM | POA: Diagnosis not present

## 2023-11-25 ENCOUNTER — Telehealth: Payer: Self-pay | Admitting: Internal Medicine

## 2023-11-25 ENCOUNTER — Ambulatory Visit: Admitting: Family Medicine

## 2023-11-25 DIAGNOSIS — I10 Essential (primary) hypertension: Secondary | ICD-10-CM | POA: Diagnosis not present

## 2023-11-25 DIAGNOSIS — E1142 Type 2 diabetes mellitus with diabetic polyneuropathy: Secondary | ICD-10-CM | POA: Diagnosis not present

## 2023-11-25 NOTE — Telephone Encounter (Unsigned)
 Copied from CRM #[id number]. Topic: Clinical - Medication Question >> Nov 25, 2023  3:11 PM Donee H wrote: Reason for CRM: Patient calling requesting to speak directly to Dr. Jarold nurse regarding medications, She states she has been out of her medication since last Tuesday. She stated medication is for her blood pressure. sitaGLIPtin  (JANUVIA ) 100 MG tablet. She wants to know what do she need to do. If she has to schedule an appointment she stated she will. Please follow up with patient [phone]

## 2023-11-26 ENCOUNTER — Ambulatory Visit: Payer: Self-pay

## 2023-11-26 NOTE — Telephone Encounter (Signed)
 FYI Only or Action Required?: Action required by provider: request for appointment and patient has been out of Januvia  since last Tuesday.  Patient was last seen in primary care on 08/24/2023 by [name], MD.  Called Nurse Triage reporting Medication Problem.   Triage Disposition: Call PCP When Office is Open  Patient/caregiver understands and will follow disposition?: Yes  Copied from CRM #[id number]. Topic: Clinical - Red Word Triage >> Nov 26, 2023  9:11 AM Gustabo D wrote: Blood pressure up due to no medicine for weeks. She has been calling about it no one has called her back. Readings are morning 148/71, yesterday 171/86 , 158/83. Been out of medications since last Tuesday. Reason for Disposition  [1] Caller has NON-URGENT medicine question about med that PCP prescribed AND [2] triager unable to answer question  Answer Assessment - Initial Assessment Questions 1. NAME of MEDICINE: What medicine(s) are you calling about?     Januvia  2. QUESTION: What is your question? (e.g., double dose of medicine, side effect)     Patient reports she usually gets the Januvia  from the medication company. Patient hasn't received the medication from the company. Patient is asking if she can't get financial help, she would rather go on a different medication.   3. PRESCRIBER: Who prescribed the medicine? Reason: if prescribed by specialist, call should be referred to that group.     Sanders MD  Not out of blood pressure medication. Patient is out of diabetes medication-Januvia . Patient states she has been getting assistance from the medication nurse in the office to get this medication. Patient reports she normally receives a three month supply from the drug company but she hasn't received anything. Patient is asking for a call back from the office. Patient would like to speak to MD about a different medication that wouldn't require financial assistance.  Protocols used: Medication Question  Call-A-AH

## 2023-11-27 ENCOUNTER — Emergency Department (HOSPITAL_COMMUNITY)
Admission: EM | Admit: 2023-11-27 | Discharge: 2023-11-28 | Disposition: A | Discharge status: Home / Self Care | Attending: Emergency Medicine | Admitting: Emergency Medicine

## 2023-11-27 ENCOUNTER — Encounter (HOSPITAL_COMMUNITY): Payer: Self-pay | Admitting: *Deleted

## 2023-11-27 ENCOUNTER — Other Ambulatory Visit: Payer: Self-pay

## 2023-11-27 DIAGNOSIS — R739 Hyperglycemia, unspecified: Secondary | ICD-10-CM | POA: Insufficient documentation

## 2023-11-27 DIAGNOSIS — Z7984 Long term (current) use of oral hypoglycemic drugs: Secondary | ICD-10-CM | POA: Diagnosis not present

## 2023-11-27 DIAGNOSIS — Z760 Encounter for issue of repeat prescription: Secondary | ICD-10-CM | POA: Insufficient documentation

## 2023-11-27 DIAGNOSIS — E1165 Type 2 diabetes mellitus with hyperglycemia: Secondary | ICD-10-CM | POA: Diagnosis not present

## 2023-11-27 DIAGNOSIS — Z7982 Long term (current) use of aspirin: Secondary | ICD-10-CM | POA: Insufficient documentation

## 2023-11-27 LAB — COMPREHENSIVE METABOLIC PANEL WITH GFR
ALT: 30 U/L (ref 0–44)
AST: 34 U/L (ref 15–41)
Albumin: 4 g/dL (ref 3.5–5.0)
Alkaline Phosphatase: 71 U/L (ref 38–126)
Anion gap: 13 (ref 5–15)
BUN: 15 mg/dL (ref 8–23)
CO2: 24 mmol/L (ref 22–32)
Calcium: 9.6 mg/dL (ref 8.9–10.3)
Chloride: 100 mmol/L (ref 98–111)
Creatinine, Ser: 0.79 mg/dL (ref 0.44–1.00)
GFR, Estimated: >60 mL/min (ref >60)
Glucose, Bld: 211 mg/dL — ABNORMAL HIGH (ref 70–99)
Potassium: 4.4 mmol/L (ref 3.5–5.1)
Sodium: 137 mmol/L (ref 135–145)
Total Bilirubin: 0.9 mg/dL (ref 0.0–1.2)
Total Protein: 6.6 g/dL (ref 6.5–8.1)

## 2023-11-27 LAB — I-STAT CHEM 8, ED
BUN: 16 mg/dL (ref 8–23)
Calcium, Ion: 1.24 mmol/L (ref 1.15–1.40)
Chloride: 99 mmol/L (ref 98–111)
Creatinine, Ser: 0.8 mg/dL (ref 0.44–1.00)
Glucose, Bld: 207 mg/dL — ABNORMAL HIGH (ref 70–99)
HCT: 38 % (ref 36.0–46.0)
Hemoglobin: 12.9 g/dL (ref 12.0–15.0)
Potassium: 4.4 mmol/L (ref 3.5–5.1)
Sodium: 136 mmol/L (ref 135–145)
TCO2: 25 mmol/L (ref 22–32)

## 2023-11-27 LAB — CBC
HCT: 37.6 % (ref 36.0–46.0)
Hemoglobin: 12.3 g/dL (ref 12.0–15.0)
MCH: 30.3 pg (ref 26.0–34.0)
MCHC: 32.7 g/dL (ref 30.0–36.0)
MCV: 92.6 fL (ref 80.0–100.0)
Platelets: 210 K/uL (ref 150–400)
RBC: 4.06 MIL/uL (ref 3.87–5.11)
RDW: 12.1 % (ref 11.5–15.5)
WBC: 5.4 K/uL (ref 4.0–10.5)
nRBC: 0 % (ref 0.0–0.2)

## 2023-11-27 LAB — CBG MONITORING, ED: Glucose-Capillary: 195 mg/dL — ABNORMAL HIGH (ref 70–99)

## 2023-11-27 NOTE — ED Triage Notes (Signed)
 The pt reports that she has been out of jenuvia  since last Tuesday one week ago

## 2023-11-27 NOTE — ED Triage Notes (Signed)
 Pt states she recently switched her medications to a mail in service and she hasn't had her diabetes pill since Tuesday and her CBG was 253 today. Pt c.o some nausea but denies abd pain.

## 2023-11-28 LAB — URINALYSIS, ROUTINE W REFLEX MICROSCOPIC
Bacteria, UA: NONE SEEN
Bilirubin Urine: NEGATIVE
Glucose, UA: NEGATIVE mg/dL
Hgb urine dipstick: NEGATIVE
Ketones, ur: NEGATIVE mg/dL
Nitrite: NEGATIVE
Protein, ur: NEGATIVE mg/dL
Specific Gravity, Urine: 1.011 (ref 1.005–1.030)
pH: 5 (ref 5.0–8.0)

## 2023-11-28 MED ORDER — SITAGLIPTIN PHOSPHATE 100 MG PO TABS: ORAL_TABLET | ORAL | 0 refills | Status: AC

## 2023-11-28 MED ORDER — INSULIN ASPART 100 UNIT/ML IJ SOLN
2.0000 [IU] | Freq: Once | INTRAMUSCULAR | Status: AC
  Administered 2023-11-28: 2 [IU] via SUBCUTANEOUS

## 2023-11-28 NOTE — ED Provider Notes (Signed)
 [place] EMERGENCY DEPARTMENT AT [facility] Provider Note   CSN: [id number] Arrival date & time: 11/27/23  1740     Patient presents with: Hyperglycemia   Elizabeth Mcfarland is a 83 y.o. female.   The history is provided by the patient.  Hyperglycemia Blood sugar level PTA:  200s Severity:  Mild Onset quality:  Gradual Duration:  2 weeks Timing:  Constant Progression:  Unchanged Chronicity:  Recurrent Current diabetic treatments: januvia . Context: not change in medication and not insulin  pump use   Relieved by:  Nothing Ineffective treatments:  None tried Associated symptoms: no abdominal pain, no altered mental status, no blurred vision, no chest pain, no confusion and no fever        Prior to Admission medications   Medication Sig Start Date End Date Taking? Authorizing Provider  acetaminophen  (TYLENOL ) 500 MG tablet Take 1,000 mg by mouth every 6 (six) hours as needed for mild pain (pain score 1-3) or moderate pain (pain score 4-6).    [provider]  amLODipine  (NORVASC ) 10 MG tablet TAKE 1 TABLET BY MOUTH EVERY DAY 11/09/23   [name], MD  anastrozole  (ARIMIDEX ) 1 MG tablet TAKE 1 TABLET BY MOUTH DAILY. 11/01/23   Boscia, Heather E, NP  Ascorbic Acid (VITAMIN C) 1000 MG tablet Take 1,000 mg by mouth daily.    [provider]  aspirin  EC 81 MG tablet Take 81 mg by mouth daily.    [provider]  atorvastatin  (LIPITOR ) 80 MG tablet Take 1 tablet (80 mg total) by mouth daily. 07/01/23   [name], MD  cholecalciferol (VITAMIN D ) 1000 UNITS tablet Take 1,000 Units by mouth daily.    [provider]  dorzolamide -timolol  (COSOPT ) 2-0.5 % ophthalmic solution Place 1 drop into both eyes 2 (two) times daily. 04/28/23   [provider]  glimepiride  (AMARYL ) 4 MG tablet Take 1 tablet (4 mg total) by mouth every morning. 08/19/23   [name], MD  Multiple Vitamins-Minerals (CENTRUM SILVER 50+WOMEN PO) Take 1 tablet by  mouth daily.    [provider]  nitroGLYCERIN  (NITROSTAT ) 0.4 MG SL tablet Place 1 tablet (0.4 mg total) under the tongue every 5 (five) minutes as needed for chest pain. 06/24/23 06/23/24  [name], MD  olmesartan  (BENICAR ) 40 MG tablet TAKE 1 TABLET(40 MG) BY MOUTH DAILY 07/01/23   [name], MD  [facility] ULTRA test strip USE AS DIRECTED TWICE DAILY 01/18/21   Moore, Janece, FNP  pantoprazole  (PROTONIX ) 40 MG tablet Take 1 tablet (40 mg total) by mouth daily. 07/01/23   [name], MD  Polyethyl Glycol-Propyl Glycol (SYSTANE OP) Place 1 drop into both eyes 2 (two) times daily.    [provider]  polyethylene glycol (MIRALAX  / GLYCOLAX ) packet Take 17 g by mouth daily.     [provider]  sitaGLIPtin  (JANUVIA ) 100 MG tablet TAKE 1 TABLET ( 100MG ) BY MOUTH DAILY. 11/28/23   Alexee Delsanto, MD    Allergies: Codeine and Percocet [oxycodone-acetaminophen ]    Review of Systems  Constitutional:  Negative for fever.  Eyes:  Negative for blurred vision.  Cardiovascular:  Negative for chest pain.  Gastrointestinal:  Negative for abdominal pain.  Neurological:  Negative for light-headedness.  Psychiatric/Behavioral:  Negative for confusion.   All other systems reviewed and are negative.   Updated Vital Signs BP 135/77   Pulse 62   Temp 98 F (36.7 C) (Oral)   Resp 18   Ht 5' 3 (1.6 m)  Wt 54.9 kg   SpO2 100%   BMI 21.44 kg/m   Physical Exam Vitals and nursing note reviewed.  Constitutional:      General: She is not in acute distress.    Appearance: Normal appearance. She is well-developed.  HENT:     Head: Normocephalic and atraumatic.     Nose: Nose normal.  Eyes:     Pupils: Pupils are equal, round, and reactive to light.  Cardiovascular:     Rate and Rhythm: Normal rate and regular rhythm.     Pulses: Normal pulses.     Heart sounds: Normal heart sounds.  Pulmonary:     Effort: Pulmonary effort is normal. No respiratory distress.      Breath sounds: Normal breath sounds.  Abdominal:     General: Bowel sounds are normal. There is no distension.     Palpations: Abdomen is soft.     Tenderness: There is no abdominal tenderness. There is no guarding or rebound.  Musculoskeletal:        General: Normal range of motion.     Cervical back: Normal range of motion and neck supple.  Skin:    General: Skin is warm and dry.     Capillary Refill: Capillary refill takes less than 2 seconds.     Findings: No erythema or rash.  Neurological:     General: No focal deficit present.     Mental Status: She is alert.     Deep Tendon Reflexes: Reflexes normal.  Psychiatric:        Mood and Affect: Mood normal.     (all labs ordered are listed, but only abnormal results are displayed) Results for orders placed or performed during the hospital encounter of 11/27/23  CBG monitoring, ED   Collection Time: 11/27/23  6:13 PM  Result Value Ref Range   Glucose-Capillary 195 (H) 70 - 99 mg/dL  Comprehensive metabolic panel   Collection Time: 11/27/23  6:18 PM  Result Value Ref Range   Sodium 137 135 - 145 mmol/L   Potassium 4.4 3.5 - 5.1 mmol/L   Chloride 100 98 - 111 mmol/L   CO2 24 22 - 32 mmol/L   Glucose, Bld 211 (H) 70 - 99 mg/dL   BUN 15 8 - 23 mg/dL   Creatinine, Ser 9.20 0.44 - 1.00 mg/dL   Calcium  9.6 8.9 - 10.3 mg/dL   Total Protein 6.6 6.5 - 8.1 g/dL   Albumin 4.0 3.5 - 5.0 g/dL   AST 34 15 - 41 U/L   ALT 30 0 - 44 U/L   Alkaline Phosphatase 71 38 - 126 U/L   Total Bilirubin 0.9 0.0 - 1.2 mg/dL   GFR, Estimated >39 >39 mL/min   Anion gap 13 5 - 15  CBC   Collection Time: 11/27/23  6:18 PM  Result Value Ref Range   WBC 5.4 4.0 - 10.5 K/uL   RBC 4.06 3.87 - 5.11 MIL/uL   Hemoglobin 12.3 12.0 - 15.0 g/dL   HCT 62.3 63.9 - 53.9 %   MCV 92.6 80.0 - 100.0 fL   MCH 30.3 26.0 - 34.0 pg   MCHC 32.7 30.0 - 36.0 g/dL   RDW 87.8 88.4 - 84.4 %   Platelets 210 150 - 400 K/uL   nRBC 0.0 0.0 - 0.2 %  I-stat chem 8, ED (not  at [facility], [facility] or [facility])   Collection Time: 11/27/23  6:30 PM  Result Value Ref Range   Sodium 136 135 -  145 mmol/L   Potassium 4.4 3.5 - 5.1 mmol/L   Chloride 99 98 - 111 mmol/L   BUN 16 8 - 23 mg/dL   Creatinine, Ser 9.19 0.44 - 1.00 mg/dL   Glucose, Bld 792 (H) 70 - 99 mg/dL   Calcium , Ion 1.24 1.15 - 1.40 mmol/L   TCO2 25 22 - 32 mmol/L   Hemoglobin 12.9 12.0 - 15.0 g/dL   HCT 61.9 63.9 - 53.9 %  Urinalysis, Routine w reflex microscopic -Urine, Clean Catch   Collection Time: 11/27/23 11:35 PM  Result Value Ref Range   Color, Urine YELLOW YELLOW   APPearance CLEAR CLEAR   Specific Gravity, Urine 1.011 1.005 - 1.030   pH 5.0 5.0 - 8.0   Glucose, UA NEGATIVE NEGATIVE mg/dL   Hgb urine dipstick NEGATIVE NEGATIVE   Bilirubin Urine NEGATIVE NEGATIVE   Ketones, ur NEGATIVE NEGATIVE mg/dL   Protein, ur NEGATIVE NEGATIVE mg/dL   Nitrite NEGATIVE NEGATIVE   Leukocytes,Ua MODERATE (A) NEGATIVE   RBC / HPF 0-5 0 - 5 RBC/hpf   WBC, UA 6-10 0 - 5 WBC/hpf   Bacteria, UA NONE SEEN NONE SEEN   Squamous Epithelial / HPF 0-5 0 - 5 /HPF   Mucus PRESENT    Hyaline Casts, UA PRESENT    No results found.  EKG: None  Radiology: No results found.   Procedures   Medications Ordered in the ED  insulin  aspart (novoLOG ) injection 2 Units (2 Units Subcutaneous Given 11/28/23 0200)                                    Medical Decision Making Patient out of her januvia  x 2 weeks and is not going to her doctor til next week sugara in 200s  Amount and/or Complexity of Data Reviewed Independent Historian:     Details: Daughter see above  External Data Reviewed: notes.    Details: Previous notes reviewed  Labs: ordered.    Details: Normal white count 5.4, normal hemoglobin 12.3, normal platelets normal sodium 137, normal potassium glucose slight elevation 207  Risk Prescription drug management. Risk Details: Patient is well appearing.  Have given low dose insulin .  Will refill Januvia .   Follow up with PMD.  Stable for discharge.       Final diagnoses:  Hyperglycemia  Encounter for medication refill   No signs of systemic illness or infection. The patient is nontoxic-appearing on exam and vital signs are within normal limits.  I have reviewed the triage vital signs and the nursing notes. Pertinent labs & imaging results that were available during my care of the patient were reviewed by me and considered in my medical decision making (see chart for details). After history, exam, and medical workup I feel the patient has been appropriately medically screened and is safe for discharge home. Pertinent diagnoses were discussed with the patient. Patient was given return precautions.  ED Discharge Orders          Ordered    sitaGLIPtin  (JANUVIA ) 100 MG tablet        11/28/23 0157               Talan Gildner, MD 11/28/23 0630

## 2023-11-30 ENCOUNTER — Telehealth: Payer: Self-pay | Admitting: Pharmacist

## 2023-11-30 DIAGNOSIS — Z79899 Other long term (current) drug therapy: Secondary | ICD-10-CM | POA: Diagnosis not present

## 2023-11-30 DIAGNOSIS — I509 Heart failure, unspecified: Secondary | ICD-10-CM | POA: Diagnosis not present

## 2023-11-30 DIAGNOSIS — Z0189 Encounter for other specified special examinations: Secondary | ICD-10-CM | POA: Diagnosis not present

## 2023-11-30 DIAGNOSIS — E559 Vitamin D deficiency, unspecified: Secondary | ICD-10-CM | POA: Diagnosis not present

## 2023-11-30 DIAGNOSIS — C50211 Malignant neoplasm of upper-inner quadrant of right female breast: Secondary | ICD-10-CM | POA: Diagnosis not present

## 2023-11-30 DIAGNOSIS — I25119 Atherosclerotic heart disease of native coronary artery with unspecified angina pectoris: Secondary | ICD-10-CM | POA: Diagnosis not present

## 2023-11-30 DIAGNOSIS — E119 Type 2 diabetes mellitus without complications: Secondary | ICD-10-CM

## 2023-11-30 DIAGNOSIS — Z1159 Encounter for screening for other viral diseases: Secondary | ICD-10-CM | POA: Diagnosis not present

## 2023-11-30 DIAGNOSIS — Z170 Estrogen receptor positive status [ER+]: Secondary | ICD-10-CM | POA: Diagnosis not present

## 2023-11-30 DIAGNOSIS — E1152 Type 2 diabetes mellitus with diabetic peripheral angiopathy with gangrene: Secondary | ICD-10-CM | POA: Diagnosis not present

## 2023-11-30 DIAGNOSIS — I739 Peripheral vascular disease, unspecified: Secondary | ICD-10-CM | POA: Diagnosis not present

## 2023-11-30 DIAGNOSIS — Z136 Encounter for screening for cardiovascular disorders: Secondary | ICD-10-CM | POA: Diagnosis not present

## 2023-11-30 NOTE — Progress Notes (Signed)
   11/30/2023  Patient ID: Elizabeth Mcfarland, female   DOB: 07-06-1940, 83 y.o.   MRN: [id number]  Patient was called to follow up on Januvia . HIPAA identifiers were obtained.  Patient reported visiting the ED last Friday due to being out of Januvia .    Elizabeth Mcfarland is an 83 year old female with a medical history significant for:  type 2 diabetes, coronary artery disease, hyperlipidemia, peripheral arterial disease, GERD, and hypertension.  She recently visited the ED for hyperglycemia because she had been out of her Januvia .  Patient reported being out of Januvia  for a few weeks and that she called the office about it.  We worked with the Patient earlier this year and got her approved to receive Januvia  at no cost from [employer]. Patient most likely did not call to receive her refill through the program.  [employer] Patient Assistance Program was called. The representative said Januvia  would be delivered to the Patient tomorrow.   Plan: Follow up with the Patient at the end of the week.   Cassius DOROTHA Brought, PharmD, BCACP Clinical Pharmacist [phone]

## 2023-12-14 DIAGNOSIS — G8929 Other chronic pain: Secondary | ICD-10-CM | POA: Diagnosis not present

## 2023-12-14 DIAGNOSIS — M8580 Other specified disorders of bone density and structure, unspecified site: Secondary | ICD-10-CM | POA: Diagnosis not present

## 2023-12-14 DIAGNOSIS — Z170 Estrogen receptor positive status [ER+]: Secondary | ICD-10-CM | POA: Diagnosis not present

## 2023-12-14 DIAGNOSIS — I25119 Atherosclerotic heart disease of native coronary artery with unspecified angina pectoris: Secondary | ICD-10-CM | POA: Diagnosis not present

## 2023-12-14 DIAGNOSIS — M545 Low back pain, unspecified: Secondary | ICD-10-CM | POA: Diagnosis not present

## 2023-12-14 DIAGNOSIS — I509 Heart failure, unspecified: Secondary | ICD-10-CM | POA: Diagnosis not present

## 2023-12-14 DIAGNOSIS — C50211 Malignant neoplasm of upper-inner quadrant of right female breast: Secondary | ICD-10-CM | POA: Diagnosis not present

## 2023-12-14 DIAGNOSIS — Z0001 Encounter for general adult medical examination with abnormal findings: Secondary | ICD-10-CM | POA: Diagnosis not present

## 2023-12-14 DIAGNOSIS — E1152 Type 2 diabetes mellitus with diabetic peripheral angiopathy with gangrene: Secondary | ICD-10-CM | POA: Diagnosis not present

## 2023-12-14 DIAGNOSIS — J302 Other seasonal allergic rhinitis: Secondary | ICD-10-CM | POA: Diagnosis not present

## 2023-12-14 DIAGNOSIS — Z7189 Other specified counseling: Secondary | ICD-10-CM | POA: Diagnosis not present

## 2023-12-21 DIAGNOSIS — E1142 Type 2 diabetes mellitus with diabetic polyneuropathy: Secondary | ICD-10-CM | POA: Diagnosis not present

## 2023-12-21 DIAGNOSIS — I10 Essential (primary) hypertension: Secondary | ICD-10-CM | POA: Diagnosis not present

## 2023-12-22 DIAGNOSIS — E1142 Type 2 diabetes mellitus with diabetic polyneuropathy: Secondary | ICD-10-CM | POA: Diagnosis not present

## 2023-12-22 DIAGNOSIS — I10 Essential (primary) hypertension: Secondary | ICD-10-CM | POA: Diagnosis not present

## 2023-12-24 ENCOUNTER — Other Ambulatory Visit: Payer: Self-pay | Admitting: Internal Medicine

## 2023-12-24 DIAGNOSIS — E1151 Type 2 diabetes mellitus with diabetic peripheral angiopathy without gangrene: Secondary | ICD-10-CM

## 2023-12-24 DIAGNOSIS — I10 Essential (primary) hypertension: Secondary | ICD-10-CM

## 2024-01-04 ENCOUNTER — Ambulatory Visit: Admitting: Internal Medicine

## 2024-01-04 NOTE — Patient Instructions (Incomplete)

## 2024-01-04 NOTE — Progress Notes (Deleted)
 I,Lindaann Gradilla T Emmitt, CMA,acting as a Neurosurgeon for Catheryn LOISE Slocumb, MD.,have documented all relevant documentation on the behalf of Catheryn LOISE Slocumb, MD,as directed by  Catheryn LOISE Slocumb, MD while in the presence of Catheryn LOISE Slocumb, MD.  Subjective:  Patient ID: Elizabeth Mcfarland , female    DOB: [birth year] , 83 y.o.   MRN: [id number]  No chief complaint on file.   HPI  Patient presents today for bp & dm follow up. She reports compliance with medications. Denies headache, chest pain, and SOB. She has no other concerns or complaints at this time.      Diabetes She presents for her follow-up diabetic visit. She has type 2 diabetes mellitus. Her disease course has been stable. There are no hypoglycemic associated symptoms. Pertinent negatives for hypoglycemia include no dizziness or headaches. Pertinent negatives for diabetes include no chest pain, no polydipsia, no polyphagia and no polyuria. There are no hypoglycemic complications. Symptoms are stable. Diabetic complications include heart disease. Risk factors for coronary artery disease include hypertension, sedentary lifestyle, dyslipidemia and diabetes mellitus. Current diabetic treatment includes oral agent (dual therapy). She is compliant with treatment all of the time. Her weight is stable. She is following a generally healthy diet. When asked about meal planning, she reported none. She participates in exercise daily (gardening). (Blood sugars was 210 last night, she took her glyburide at bedtime and her blood sugar this morning was 92 after taking a 1/2 tablet) An ACE inhibitor/angiotensin II receptor blocker is being taken. Eye exam is current.  Hypertension This is a chronic problem. The current episode started more than 1 year ago. The problem is unchanged. Pertinent negatives include no chest pain, headaches or palpitations. Past treatments include ACE inhibitors. The current treatment provides significant improvement.     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   Cataract    Chronic kidney disease    Diabetes mellitus without complication (HCC)    Esophageal ulcer with bleeding 05/05/2013   05/05/2013 EGD linear distal esophageal ulcer - cause of hematemesis prior to EGD    GERD (gastroesophageal reflux disease)    History of kidney stones    Hyperlipidemia    Hypertension    Kidney cysts    Personal history of radiation therapy    Sarcoidosis 1970     Family History  Problem Relation Age of Onset   Early death Mother    Stroke Father    Diabetes Sister    Kidney failure Sister    Colon polyps Sister    Stroke Brother    Colon polyps Brother    Cancer Brother 20       prostate cancer   Heart disease Brother    Emphysema Brother    Cancer Brother 52       prostate cancer   Asthma Brother    CAD Maternal Grandmother    Breast cancer Neg Hx    Colon cancer Neg Hx    Esophageal cancer Neg Hx    Rectal cancer Neg Hx    Stomach cancer Neg Hx      Current Outpatient Medications:    acetaminophen  (TYLENOL ) 500 MG tablet, Take 1,000 mg by mouth every 6 (six) hours as needed for mild pain (pain score 1-3) or moderate pain (pain score 4-6)., Disp: , Rfl:  amLODipine  (NORVASC ) 10 MG tablet, TAKE 1 TABLET BY MOUTH EVERY DAY, Disp: 90 tablet, Rfl: 1   anastrozole  (ARIMIDEX ) 1 MG tablet, TAKE 1 TABLET BY MOUTH DAILY., Disp: 90 tablet, Rfl: 1   Ascorbic Acid (VITAMIN C) 1000 MG tablet, Take 1,000 mg by mouth daily., Disp: , Rfl:    aspirin  EC 81 MG tablet, Take 81 mg by mouth daily., Disp: , Rfl:    atorvastatin  (LIPITOR ) 80 MG tablet, Take 1 tablet (80 mg total) by mouth daily., Disp: 90 tablet, Rfl: 3   cholecalciferol (VITAMIN D ) 1000 UNITS tablet, Take 1,000 Units by mouth daily., Disp: , Rfl:    dorzolamide -timolol  (COSOPT ) 2-0.5 % ophthalmic solution, Place 1 drop into both  eyes 2 (two) times daily., Disp: , Rfl:    glimepiride  (AMARYL ) 4 MG tablet, TAKE 1 TABLET BY MOUTH EVERY MORNING., Disp: 90 tablet, Rfl: 1   Multiple Vitamins-Minerals (CENTRUM SILVER 50+WOMEN PO), Take 1 tablet by mouth daily., Disp: , Rfl:    nitroGLYCERIN  (NITROSTAT ) 0.4 MG SL tablet, Place 1 tablet (0.4 mg total) under the tongue every 5 (five) minutes as needed for chest pain., Disp: 25 tablet, Rfl: 1   olmesartan  (BENICAR ) 40 MG tablet, TAKE 1 TABLET BY MOUTH EVERY DAY, Disp: 90 tablet, Rfl: 1   ONETOUCH ULTRA test strip, USE AS DIRECTED TWICE DAILY, Disp: 100 strip, Rfl: 3   pantoprazole  (PROTONIX ) 40 MG tablet, Take 1 tablet (40 mg total) by mouth daily., Disp: 90 tablet, Rfl: 2   Polyethyl Glycol-Propyl Glycol (SYSTANE OP), Place 1 drop into both eyes 2 (two) times daily., Disp: , Rfl:    polyethylene glycol (MIRALAX  / GLYCOLAX ) packet, Take 17 g by mouth daily. , Disp: , Rfl:    sitaGLIPtin  (JANUVIA ) 100 MG tablet, TAKE 1 TABLET ( 100MG ) BY MOUTH DAILY., Disp: 30 tablet, Rfl: 0   Allergies  Allergen Reactions   Codeine Nausea And Vomiting   Percocet [Oxycodone-Acetaminophen ] Nausea And Vomiting     Review of Systems  Constitutional: Negative.   Respiratory: Negative.    Cardiovascular: Negative.  Negative for chest pain and palpitations.  Endocrine: Negative for polydipsia, polyphagia and polyuria.  Neurological: Negative.  Negative for dizziness and headaches.  Psychiatric/Behavioral: Negative.       There were no vitals filed for this visit. There is no height or weight on file to calculate BMI.  Wt Readings from Last 3 Encounters:  11/27/23 121 lb 0.5 oz (54.9 kg)  10/27/23 121 lb (54.9 kg)  10/26/23 122 lb 4.8 oz (55.5 kg)     Objective:  Physical Exam      Assessment And Plan:  There are no diagnoses linked to this encounter.   No follow-ups on file.  Patient was given opportunity to ask questions. Patient verbalized understanding of the plan and was able to  repeat key elements of the plan. All questions were answered to their satisfaction.  Catheryn LOISE Slocumb, MD  I, Catheryn LOISE Slocumb, MD, have reviewed all documentation for this visit. The documentation on 01/04/24 for the exam, diagnosis, procedures, and orders are all accurate and complete.   IF YOU HAVE BEEN REFERRED TO A SPECIALIST, IT MAY TAKE 1-2 WEEKS TO SCHEDULE/PROCESS THE REFERRAL. IF YOU HAVE NOT HEARD FROM US /SPECIALIST IN TWO WEEKS, PLEASE GIVE US  A CALL AT [phone].   THE PATIENT IS ENCOURAGED TO PRACTICE SOCIAL DISTANCING DUE TO THE COVID-19 PANDEMIC.

## 2024-01-11 ENCOUNTER — Ambulatory Visit

## 2024-01-14 ENCOUNTER — Inpatient Hospital Stay: Admission: RE | Admit: 2024-01-14 | Discharge: 2024-01-14 | Attending: Nurse Practitioner

## 2024-01-14 ENCOUNTER — Telehealth: Payer: Self-pay | Admitting: Diagnostic Neuroimaging

## 2024-01-14 DIAGNOSIS — C50211 Malignant neoplasm of upper-inner quadrant of right female breast: Secondary | ICD-10-CM

## 2024-01-14 NOTE — Telephone Encounter (Signed)
 Appointment details confirmed

## 2024-01-18 ENCOUNTER — Encounter: Payer: Self-pay | Admitting: Diagnostic Neuroimaging

## 2024-01-18 ENCOUNTER — Ambulatory Visit: Admitting: Diagnostic Neuroimaging

## 2024-01-18 VITALS — BP 166/72 | HR 60 | Ht 62.0 in | Wt 119.2 lb

## 2024-01-18 DIAGNOSIS — M5442 Lumbago with sciatica, left side: Secondary | ICD-10-CM | POA: Diagnosis not present

## 2024-01-18 DIAGNOSIS — M5441 Lumbago with sciatica, right side: Secondary | ICD-10-CM

## 2024-01-18 DIAGNOSIS — R269 Unspecified abnormalities of gait and mobility: Secondary | ICD-10-CM

## 2024-01-18 DIAGNOSIS — R252 Cramp and spasm: Secondary | ICD-10-CM | POA: Diagnosis not present

## 2024-01-18 DIAGNOSIS — M79604 Pain in right leg: Secondary | ICD-10-CM

## 2024-01-18 DIAGNOSIS — G8929 Other chronic pain: Secondary | ICD-10-CM

## 2024-01-18 NOTE — Progress Notes (Signed)
 [facility]  PATIENT: Elizabeth Mcfarland DOB: [birth year]  REFERRING CLINICIAN: [name], MD HISTORY FROM: patient and daughter  REASON FOR VISIT: new consult   HISTORICAL  CHIEF COMPLAINT:  Chief Complaint  Patient presents with   RM 6     Patient is here with her daughter for imbalance, numbness, and tingling in both hands, upper thighs and lower legs.     HISTORY OF PRESENT ILLNESS:   83 year old female here for evaluation of right lower extremity cramps, numbness, and muscle cramps and stiffness in the fingers.  Symptoms started 3 to 6 months ago.  Has some chronic neck and lower back pain issues.  Patient had a fall in June when working in the garden.  Since that time symptoms have slightly improved.   REVIEW OF SYSTEMS: Full 14 system review of systems performed and negative with exception of: as per HPI.  ALLERGIES: Allergies  Allergen Reactions   Codeine Nausea And Vomiting   Percocet [Oxycodone-Acetaminophen ] Nausea And Vomiting    HOME MEDICATIONS: Outpatient Medications Prior to Visit  Medication Sig Dispense Refill   acetaminophen  (TYLENOL ) 500 MG tablet Take 1,000 mg by mouth every 6 (six) hours as needed for mild pain (pain score 1-3) or moderate pain (pain score 4-6).     amLODipine  (NORVASC ) 10 MG tablet TAKE 1 TABLET BY MOUTH EVERY DAY 90 tablet 1   anastrozole  (ARIMIDEX ) 1 MG tablet TAKE 1 TABLET BY MOUTH DAILY. 90 tablet 1   Ascorbic Acid (VITAMIN C) 1000 MG tablet Take 1,000 mg by mouth daily.     aspirin  EC 81 MG tablet Take 81 mg by mouth daily.     atorvastatin  (LIPITOR ) 80 MG tablet Take 1 tablet (80 mg total) by mouth daily. 90 tablet 3   cholecalciferol (VITAMIN D ) 1000 UNITS tablet Take 1,000 Units by mouth daily.     dorzolamide -timolol  (COSOPT ) 2-0.5 % ophthalmic solution Place 1 drop into both eyes 2 (two) times daily.     glimepiride  (AMARYL ) 4 MG tablet TAKE 1 TABLET BY MOUTH EVERY MORNING. 90 tablet 1   JANUMET  50-500  MG tablet Take 1 tablet by mouth 2 (two) times daily.     Multiple Vitamins-Minerals (CENTRUM SILVER 50+WOMEN PO) Take 1 tablet by mouth daily.     nitroGLYCERIN  (NITROSTAT ) 0.4 MG SL tablet Place 1 tablet (0.4 mg total) under the tongue every 5 (five) minutes as needed for chest pain. 25 tablet 1   olmesartan  (BENICAR ) 40 MG tablet TAKE 1 TABLET BY MOUTH EVERY DAY 90 tablet 1   ONETOUCH ULTRA test strip USE AS DIRECTED TWICE DAILY 100 strip 3   pantoprazole  (PROTONIX ) 40 MG tablet Take 1 tablet (40 mg total) by mouth daily. 90 tablet 2   Polyethyl Glycol-Propyl Glycol (SYSTANE OP) Place 1 drop into both eyes 2 (two) times daily.     polyethylene glycol (MIRALAX  / GLYCOLAX ) packet Take 17 g by mouth daily.      sitaGLIPtin  (JANUVIA ) 100 MG tablet TAKE 1 TABLET ( 100MG ) BY MOUTH DAILY. (Patient not taking: Reported on 01/18/2024) 30 tablet 0   No facility-administered medications prior to visit.    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   Cataract    Chronic kidney disease    Diabetes mellitus without complication (HCC)  Esophageal ulcer with bleeding 05/05/2013   05/05/2013 EGD linear distal esophageal ulcer - cause of hematemesis prior to EGD    GERD (gastroesophageal reflux disease)    History of kidney stones    Hyperlipidemia    Hypertension    Kidney cysts    Personal history of radiation therapy    Sarcoidosis 1970    PAST SURGICAL HISTORY: Past Surgical History:  Procedure Laterality Date   ABDOMINAL HYSTERECTOMY     BREAST BIOPSY Left 07/09/2021   BREAST EXCISIONAL BIOPSY Left 11/2018   Fibroadenoma   BREAST LUMPECTOMY     BREAST LUMPECTOMY WITH RADIOACTIVE SEED AND SENTINEL LYMPH NODE BIOPSY Bilateral 11/25/2018   Procedure: BILATERAL BREAST LUMPECTOMIES  WITH BILATERAL  RADIOACTIVE SEEDS AND RIGHT  SENTINEL LYMPH NODE BIOPSY;  Surgeon: Curvin Deward MOULD, MD;  Location: [facility] OR;  Service: General;  Laterality: Bilateral;   CATARACT EXTRACTION W/ INTRAOCULAR LENS IMPLANT     COLONOSCOPY     COLONOSCOPY, ESOPHAGOGASTRODUODENOSCOPY (EGD) AND ESOPHAGEAL DILATION N/A 05/05/2013   Procedure: COLONOSCOPY, ESOPHAGOGASTRODUODENOSCOPY (EGD) AND ESOPHAGEAL DILATION (ED);  Surgeon: Lupita FORBES Commander, MD;  Location: [facility] ENDOSCOPY;  Service: Endoscopy;  Laterality: N/A;   LEFT HEART CATH AND CORONARY ANGIOGRAPHY N/A 04/08/2021   Procedure: LEFT HEART CATH AND CORONARY ANGIOGRAPHY;  Surgeon: Wonda Sharper, MD;  Location: [facility] INVASIVE CV LAB;  Service: Cardiovascular;  Laterality: N/A;   LITHOTRIPSY     POLYPECTOMY     UPPER GASTROINTESTINAL ENDOSCOPY      FAMILY HISTORY: Family History  Problem Relation Age of Onset   Early death Mother    Stroke Father    Diabetes Sister    Kidney failure Sister    Colon polyps Sister    Stroke Brother    Colon polyps Brother    Cancer Brother 59       prostate cancer   Heart disease Brother    Emphysema Brother    Cancer Brother 31       prostate cancer   Asthma Brother    CAD Maternal Grandmother    Breast cancer Neg Hx    Colon cancer Neg Hx    Esophageal cancer Neg Hx    Rectal cancer Neg Hx    Stomach cancer Neg Hx    Seizures Neg Hx    Migraines Neg Hx     SOCIAL HISTORY: Social History   Socioeconomic History   Marital status: Single    Spouse name: Not on file   Number of children: 1   Years of education: Not on file   Highest education level: GED or equivalent  Occupational History   Occupation: Conservator, Museum/gallery: SELF-EMPLOYED   Occupation: retired  Tobacco Use   Smoking status: Never   Smokeless tobacco: Never  Vaping Use   Vaping status: Never Used  Substance and Sexual Activity   Alcohol use: No   Drug use: No   Sexual activity: Not Currently  Other Topics Concern   Not on file  Social History Narrative   Lives alone    1 cup of tea and sometimes coffee    Social Drivers of Corporate Investment Banker Strain: Low Risk  (06/24/2023)   Overall Financial Resource Strain (CARDIA)    Difficulty of Paying Living Expenses: Not hard at all  Food Insecurity: No Food Insecurity (06/24/2023)   Hunger Vital Sign    Worried About Running Out of Food in the Last Year: Never true    Ran Out  of Food in the Last Year: Never true  Transportation Needs: No Transportation Needs (06/24/2023)   PRAPARE - Administrator, Civil Service (Medical): No    Lack of Transportation (Non-Medical): No  Physical Activity: Inactive (06/24/2023)   Exercise Vital Sign    Days of Exercise per Week: 0 days    Minutes of Exercise per Session: 0 min  Stress: No Stress Concern Present (06/24/2023)   [employer] of Occupational Health - Occupational Stress Questionnaire    Feeling of Stress : Not at all  Social Connections: Moderately Integrated (06/24/2023)   Social Connection and Isolation Panel    Frequency of Communication with Friends and Family: More than three times a week    Frequency of Social Gatherings with Friends and Family: More than three times a week    Attends Religious Services: More than 4 times per year    Active Member of [employer] or Organizations: Yes    Attends Banker Meetings: More than 4 times per year    Marital Status: Divorced  Intimate Partner Violence: Not At Risk (06/24/2023)   Humiliation, Afraid, Rape, and Kick questionnaire    Fear of Current or Ex-Partner: No    Emotionally Abused: No    Physically Abused: No    Sexually Abused: No     PHYSICAL EXAM  GENERAL EXAM/CONSTITUTIONAL: Vitals:  Vitals:   01/18/24 1058 01/18/24 1146  BP: (!) 180/63 (!) 166/72  Pulse: 60   SpO2: 96%   Weight: 119 lb 3.2 oz (54.1 kg)   Height: 5' 2 (1.575 m)    Body mass index is 21.8 kg/m. Wt Readings from Last 3 Encounters:  01/18/24 119 lb 3.2 oz (54.1 kg)  11/27/23 121 lb 0.5 oz (54.9 kg)   10/27/23 121 lb (54.9 kg)   Patient is in no distress; well developed, nourished and groomed; neck is supple  CARDIOVASCULAR: Examination of carotid arteries is normal; no carotid bruits Regular rate and rhythm, no murmurs Examination of peripheral vascular system by observation and palpation is normal  EYES: Ophthalmoscopic exam of optic discs and posterior segments is normal; no papilledema or hemorrhages No results found.  MUSCULOSKELETAL: Gait, strength, tone, movements noted in Neurologic exam below  NEUROLOGIC: MENTAL STATUS:      No data to display         awake, alert, oriented to person, place and time recent and remote memory intact normal attention and concentration language fluent, comprehension intact, naming intact fund of knowledge appropriate  CRANIAL NERVE:  2nd - no papilledema on fundoscopic exam 2nd, 3rd, 4th, 6th - pupils equal and reactive to light, visual fields full to confrontation, extraocular muscles intact, no nystagmus 5th - facial sensation symmetric 7th - facial strength symmetric 8th - hearing intact 9th - palate elevates symmetrically, uvula midline 11th - shoulder shrug symmetric 12th - tongue protrusion midline  MOTOR:  normal bulk and tone, full strength in the BUE, BLE  SENSORY:  normal and symmetric to light touch, temperature, vibration  COORDINATION:  finger-nose-finger, fine finger movements normal  REFLEXES:  deep tendon reflexes TRACEand symmetric  GAIT/STATION:  narrow based gait; USING CANE     DIAGNOSTIC DATA (LABS, IMAGING, TESTING) - I reviewed patient records, labs, notes, testing and imaging myself where available.  Lab Results  Component Value Date   WBC 5.4 11/27/2023   HGB 12.9 11/27/2023   HCT 38.0 11/27/2023   MCV 92.6 11/27/2023   PLT 210 11/27/2023  Component Value Date/Time   NA 136 11/27/2023 1830   NA 140 08/24/2023 1101   K 4.4 11/27/2023 1830   CL 99 11/27/2023 1830   CO2 24  11/27/2023 1818   GLUCOSE 207 (H) 11/27/2023 1830   BUN 16 11/27/2023 1830   BUN 11 08/24/2023 1101   CREATININE 0.80 11/27/2023 1830   CREATININE 0.77 10/26/2023 1028   CALCIUM  9.6 11/27/2023 1818   PROT 6.6 11/27/2023 1818   PROT 6.9 08/24/2023 1101   ALBUMIN 4.0 11/27/2023 1818   ALBUMIN 4.5 08/24/2023 1101   AST 34 11/27/2023 1818   AST 27 10/26/2023 1028   ALT 30 11/27/2023 1818   ALT 24 10/26/2023 1028   ALKPHOS 71 11/27/2023 1818   BILITOT 0.9 11/27/2023 1818   BILITOT 0.9 10/26/2023 1028   GFRNONAA >60 11/27/2023 1818   GFRNONAA >60 10/26/2023 1028   GFRAA 64 12/20/2019 1605   GFRAA >60 08/01/2019 0845   Lab Results  Component Value Date   CHOL 183 08/24/2023   HDL 104 08/24/2023   LDLCALC 70 08/24/2023   TRIG 44 08/24/2023   CHOLHDL 1.8 08/24/2023   Lab Results  Component Value Date   HGBA1C 6.6 (H) 08/24/2023   Lab Results  Component Value Date   VITAMINB12 >2000 (H) 06/11/2023   Lab Results  Component Value Date   TSH 1.550 08/24/2023      ASSESSMENT AND PLAN  83 y.o. year old female here with:   Dx:  1. Gait difficulty   2. Hand cramps   3. Right leg pain   4. Chronic bilateral low back pain with bilateral sciatica      PLAN:  INTERMITTENT CRAMPS IN RIGHT LEG, RIGHT FOOT, BILATERAL HANDS + GAIT DIFFICULTY (since ~June 2025) - I offered to check MRI cervical spine (rule out myelopathy) and MRI lumbar spine (rule out spinal stenosis); however patient is very claustrophobic and declines to pursue testing at this time - consider gabapentin , cyclobenzaprine if symptoms significantly worsen  HYPERTENSION - follow up with PCP Today's Vitals   01/18/24 1058  BP: (!) 180/63  Pulse: 60  SpO2: 96%  Weight: 119 lb 3.2 oz (54.1 kg)  Height: 5' 2 (1.575 m)   Orders Placed This Encounter  Procedures   MR CERVICAL SPINE WO CONTRAST   MR LUMBAR SPINE WO CONTRAST   Return for return to PCP, pending if symptoms worsen or fail to  improve.    EDUARD FABIENE HANLON, MD 01/18/2024, 11:36 AM Certified in Neurology, Neurophysiology and Neuroimaging  [facility] [address], [address] [place], [place] [address] [phone]

## 2024-01-18 NOTE — Patient Instructions (Signed)
 INTERMITTENT CRAMPS IN RIGHT LEG, RIGHT FOOT, BILATERAL HANDS + GAIT DIFFICULTY (since ~June 2025) - check MRI cervical spine (rule out myelopathy) - check MRI lumbar spine (rule out spinal stenosis) - consider gabapentin , cyclobenzaprine if symptoms significantly worsen

## 2024-01-26 ENCOUNTER — Ambulatory Visit: Admitting: Plastic Surgery

## 2024-01-26 ENCOUNTER — Encounter: Payer: Self-pay | Admitting: Plastic Surgery

## 2024-01-26 DIAGNOSIS — L910 Hypertrophic scar: Secondary | ICD-10-CM | POA: Diagnosis not present

## 2024-01-26 NOTE — Progress Notes (Signed)
   Subjective:    Patient ID: Elizabeth Mcfarland, female    DOB: 1940/08/23, 83 y.o.   MRN: [id number]  The patient is an 83 year old female here with family for reevaluation of a left breast keloid.  She underwent a steroid treatment approximately 2 months ago.  The patient says it has not hurt since.  She is quite pleased with the results.      Review of Systems  Constitutional: Negative.   Eyes: Negative.   Respiratory: Negative.    Cardiovascular: Negative.   Gastrointestinal: Negative.        Objective:   Physical Exam Cardiovascular:     Pulses: Normal pulses.  Skin:    Capillary Refill: Capillary refill takes less than 2 seconds.  Neurological:     Mental Status: She is oriented to person, place, and time.  Psychiatric:        Mood and Affect: Mood normal.        Behavior: Behavior normal.        Thought Content: Thought content normal.        Judgment: Judgment normal.        Assessment & Plan:     ICD-10-CM   1. 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  She will let us  know if she needs another injection.

## 2024-02-02 ENCOUNTER — Emergency Department (HOSPITAL_COMMUNITY)

## 2024-02-02 ENCOUNTER — Emergency Department (HOSPITAL_COMMUNITY)
Admission: EM | Admit: 2024-02-02 | Discharge: 2024-02-02 | Disposition: A | Discharge status: Home / Self Care | Attending: Emergency Medicine | Admitting: Emergency Medicine

## 2024-02-02 DIAGNOSIS — R509 Fever, unspecified: Secondary | ICD-10-CM | POA: Diagnosis not present

## 2024-02-02 DIAGNOSIS — R202 Paresthesia of skin: Secondary | ICD-10-CM | POA: Insufficient documentation

## 2024-02-02 DIAGNOSIS — Z79899 Other long term (current) drug therapy: Secondary | ICD-10-CM | POA: Insufficient documentation

## 2024-02-02 DIAGNOSIS — Z7982 Long term (current) use of aspirin: Secondary | ICD-10-CM | POA: Diagnosis not present

## 2024-02-02 LAB — CBC WITH DIFFERENTIAL/PLATELET
Abs Immature Granulocytes: 0.01 K/uL (ref 0.00–0.07)
Basophils Absolute: 0 K/uL (ref 0.0–0.1)
Basophils Relative: 1 %
Eosinophils Absolute: 0 K/uL (ref 0.0–0.5)
Eosinophils Relative: 0 %
HCT: 35.9 % — ABNORMAL LOW (ref 36.0–46.0)
Hemoglobin: 11.4 g/dL — ABNORMAL LOW (ref 12.0–15.0)
Immature Granulocytes: 0 %
Lymphocytes Relative: 12 %
Lymphs Abs: 0.6 K/uL — ABNORMAL LOW (ref 0.7–4.0)
MCH: 30 pg (ref 26.0–34.0)
MCHC: 31.8 g/dL (ref 30.0–36.0)
MCV: 94.5 fL (ref 80.0–100.0)
Monocytes Absolute: 0.4 K/uL (ref 0.1–1.0)
Monocytes Relative: 8 %
Neutro Abs: 4 K/uL (ref 1.7–7.7)
Neutrophils Relative %: 79 %
Platelets: 171 K/uL (ref 150–400)
RBC: 3.8 MIL/uL — ABNORMAL LOW (ref 3.87–5.11)
RDW: 12.5 % (ref 11.5–15.5)
WBC: 5 K/uL (ref 4.0–10.5)
nRBC: 0 % (ref 0.0–0.2)

## 2024-02-02 LAB — BASIC METABOLIC PANEL WITH GFR
Anion gap: 8 (ref 5–15)
BUN: 9 mg/dL (ref 8–23)
CO2: 28 mmol/L (ref 22–32)
Calcium: 9.5 mg/dL (ref 8.9–10.3)
Chloride: 101 mmol/L (ref 98–111)
Creatinine, Ser: 0.73 mg/dL (ref 0.44–1.00)
GFR, Estimated: >60 mL/min (ref >60)
Glucose, Bld: 246 mg/dL — ABNORMAL HIGH (ref 70–99)
Potassium: 4.2 mmol/L (ref 3.5–5.1)
Sodium: 137 mmol/L (ref 135–145)

## 2024-02-02 LAB — URINALYSIS, ROUTINE W REFLEX MICROSCOPIC
Bilirubin Urine: NEGATIVE
Glucose, UA: NEGATIVE mg/dL
Hgb urine dipstick: NEGATIVE
Ketones, ur: NEGATIVE mg/dL
Leukocytes,Ua: NEGATIVE
Nitrite: NEGATIVE
Protein, ur: NEGATIVE mg/dL
Specific Gravity, Urine: 1.008 (ref 1.005–1.030)
pH: 7 (ref 5.0–8.0)

## 2024-02-02 LAB — RESP PANEL BY RT-PCR (RSV, FLU A&B, COVID)  RVPGX2
Influenza A by PCR: NEGATIVE
Influenza B by PCR: NEGATIVE
Resp Syncytial Virus by PCR: NEGATIVE
SARS Coronavirus 2 by RT PCR: NEGATIVE

## 2024-02-02 MED ORDER — ACETAMINOPHEN 500 MG PO TABS
1000.0000 mg | ORAL_TABLET | Freq: Once | ORAL | Status: AC
  Administered 2024-02-02: 1000 mg via ORAL
  Filled 2024-02-02: qty 2

## 2024-02-02 NOTE — ED Triage Notes (Signed)
 Woke up this morning and had difficulty moving right lower leg. Has experienced the leg numbness/weakness intermittently for over a year now, but couldn't use the extremity very well this morning.

## 2024-02-02 NOTE — ED Provider Notes (Addendum)
 [place] EMERGENCY DEPARTMENT AT [facility] Provider Note   CSN: [id number] Arrival date & time: 02/02/24  1209     Patient presents with: Extremity Weakness   Elizabeth Mcfarland is a 83 y.o. female.   83 year old female with prior medical history as detailed below presents for evaluation.  Patient reports that this morning when she woke up her right lower leg from the knee down was numb and tingly.  This has improved significantly over the last several hours.  She denies pain.  She denies fever.  She denies nausea or vomiting.  She is ambulatory without difficulty.  She reports that she has had intermittent problems - the same as today -nearly every day over the last year.  She has been previously advised to get an MRI of her spine.  She will not tolerate an MRI, per her report.  In fact, when this was specifically asked she reports that she will never have an MRI since she is too claustrophobic.   The history is provided by the patient and medical records.       Prior to Admission medications   Medication Sig Start Date End Date Taking? Authorizing Provider  acetaminophen  (TYLENOL ) 500 MG tablet Take 1,000 mg by mouth every 6 (six) hours as needed for mild pain (pain score 1-3) or moderate pain (pain score 4-6).    [provider]  amLODipine  (NORVASC ) 10 MG tablet TAKE 1 TABLET BY MOUTH EVERY DAY 11/09/23   [name], MD  anastrozole  (ARIMIDEX ) 1 MG tablet TAKE 1 TABLET BY MOUTH DAILY. 11/01/23   Boscia, Heather E, NP  Ascorbic Acid (VITAMIN C) 1000 MG tablet Take 1,000 mg by mouth daily.    [provider]  aspirin  EC 81 MG tablet Take 81 mg by mouth daily.    [provider]  atorvastatin  (LIPITOR ) 80 MG tablet Take 1 tablet (80 mg total) by mouth daily. 07/01/23   [name], MD  cholecalciferol (VITAMIN D ) 1000 UNITS tablet Take 1,000 Units by mouth daily.    [provider]  dorzolamide -timolol  (COSOPT ) 2-0.5 % ophthalmic  solution Place 1 drop into both eyes 2 (two) times daily. 04/28/23   [provider]  glimepiride  (AMARYL ) 4 MG tablet TAKE 1 TABLET BY MOUTH EVERY MORNING. 12/24/23   [name], MD  JANUMET  50-500 MG tablet Take 1 tablet by mouth 2 (two) times daily. 12/09/23   [provider]  Multiple Vitamins-Minerals (CENTRUM SILVER 50+WOMEN PO) Take 1 tablet by mouth daily.    [provider]  nitroGLYCERIN  (NITROSTAT ) 0.4 MG SL tablet Place 1 tablet (0.4 mg total) under the tongue every 5 (five) minutes as needed for chest pain. 06/24/23 06/23/24  [name], MD  olmesartan  (BENICAR ) 40 MG tablet TAKE 1 TABLET BY MOUTH EVERY DAY 12/24/23   [name], MD  [facility] ULTRA test strip USE AS DIRECTED TWICE DAILY 01/18/21   Georgina Speaks, FNP  pantoprazole  (PROTONIX ) 40 MG tablet Take 1 tablet (40 mg total) by mouth daily. 07/01/23   [name], MD  Polyethyl Glycol-Propyl Glycol (SYSTANE OP) Place 1 drop into both eyes 2 (two) times daily.    [provider]  polyethylene glycol (MIRALAX  / GLYCOLAX ) packet Take 17 g by mouth daily.     [provider]  sitaGLIPtin  (JANUVIA ) 100 MG tablet TAKE 1 TABLET ( 100MG ) BY MOUTH DAILY. Patient not taking: Reported on 01/26/2024 11/28/23   Palumbo, April, MD    Allergies: Codeine and Percocet [  oxycodone-acetaminophen ]    Review of Systems  All other systems reviewed and are negative.   Updated Vital Signs BP (!) 145/78   Pulse 83   Temp (!) 100.5 F (38.1 C) (Oral)   Resp 20   SpO2 100%   Physical Exam Vitals and nursing note reviewed.  Constitutional:      General: She is not in acute distress.    Appearance: She is well-developed.  HENT:     Head: Normocephalic and atraumatic.  Eyes:     Conjunctiva/sclera: Conjunctivae normal.  Cardiovascular:     Rate and Rhythm: Normal rate and regular rhythm.     Heart sounds: No murmur heard. Pulmonary:     Effort: Pulmonary effort is normal. No respiratory  distress.     Breath sounds: Normal breath sounds.  Abdominal:     Palpations: Abdomen is soft.     Tenderness: There is no abdominal tenderness.  Musculoskeletal:        General: No swelling.     Cervical back: Neck supple.  Skin:    General: Skin is warm and dry.     Capillary Refill: Capillary refill takes less than 2 seconds.  Neurological:     General: No focal deficit present.     Mental Status: She is alert and oriented to person, place, and time. Mental status is at baseline.     Cranial Nerves: No cranial nerve deficit.     Sensory: No sensory deficit.     Motor: No weakness.     Gait: Gait normal.  Psychiatric:        Mood and Affect: Mood normal.     (all labs ordered are listed, but only abnormal results are displayed) Labs Reviewed  RESP PANEL BY RT-PCR (RSV, FLU A&B, COVID)  [id number]  URINALYSIS, ROUTINE W REFLEX MICROSCOPIC  BASIC METABOLIC PANEL WITH GFR  CBC WITH DIFFERENTIAL/PLATELET    EKG: None  Radiology: No results found.   Procedures   Medications Ordered in the ED  acetaminophen  (TYLENOL ) tablet 1,000 mg (has no administration in time range)                                    Medical Decision Making Presented primarily with complaint of transient intermittent paresthesias to the right lower extremity.  This appears to be a chronic complaint.  She reported worsening symptoms this morning.  By the time she came to the ED she reports that she is feeling much better.  Patient without weakness.  Patient with normal gait.  She appears to be in no distress on exam.  Incidentally, patient was noted to have a low-grade temperature in triage.  She is without signs or symptoms of significant infection.  Screening workup and imaging is also without significant acute abnormality identified.  COVID and flu testing is negative.  Patient's BMP shows mild hyperglycemia without other acute abnormality.  White count is 5.  UA is normal.  Chest x-ray is normal.   Obtained CT imaging of the lumbar spine does not suggest significant acute abnormality.  I suspect that her fever may be related to early mild viral syndrome.  Strict return precautions given and understood.  Reevaluation the patient feels much improved.  She desires discharge home.  She understands need for close outpatient follow-up.  Both she and her family understand strict return precautions.  Amount and/or Complexity of Data Reviewed Labs: ordered. Radiology:  ordered.  Risk OTC drugs.        Final diagnoses:  Paresthesia  Fever, unspecified fever cause    ED Discharge Orders     None          Laurice Maude BROCKS, MD 02/02/24 1433    Laurice Maude BROCKS, MD 02/02/24 1434

## 2024-02-02 NOTE — Discharge Instructions (Signed)
 Return for any problem.  ?

## 2024-02-06 ENCOUNTER — Emergency Department (HOSPITAL_COMMUNITY)
Admission: EM | Admit: 2024-02-06 | Discharge: 2024-02-06 | Disposition: A | Discharge status: Home / Self Care | Attending: Emergency Medicine | Admitting: Emergency Medicine

## 2024-02-06 ENCOUNTER — Other Ambulatory Visit: Payer: Self-pay

## 2024-02-06 DIAGNOSIS — Z79899 Other long term (current) drug therapy: Secondary | ICD-10-CM | POA: Diagnosis not present

## 2024-02-06 DIAGNOSIS — Z7982 Long term (current) use of aspirin: Secondary | ICD-10-CM | POA: Diagnosis not present

## 2024-02-06 DIAGNOSIS — I10 Essential (primary) hypertension: Secondary | ICD-10-CM | POA: Insufficient documentation

## 2024-02-06 LAB — TROPONIN T, HIGH SENSITIVITY: Troponin T High Sensitivity: 17 ng/L (ref 0–19)

## 2024-02-06 MED ORDER — HYDRALAZINE HCL 10 MG PO TABS: 10.0000 mg | ORAL_TABLET | Freq: Three times a day (TID) | ORAL | 0 refills | Status: AC | PRN

## 2024-02-06 NOTE — ED Triage Notes (Signed)
 Patient c/o hypertension x 1 week. Patient report her BP was ranging 190/90 at home and she states she is taking her BP medication regularly. Patient denies headache, dizziness and weakness. Patient denies Chest pain and SOB.

## 2024-02-06 NOTE — Discharge Instructions (Addendum)
 Please continue taking your new hydrochlorothiazide  medications daily, which may help lower your overall blood pressure.  I recommend that you reach out to your main doctor's office to discuss your continued high blood pressure.  Your primary care provider should be adjusting your chronic medications.  I provided you a new medicine called hydralazine , at a low dose 10 mg.  This is a rescue medicine to use as needed only.  If your systolic blood pressure (top number) is over 190 mmhg on TWO REPEAT CHECKS, at least 20 minutes apart, you can take 1 tablet.  Do not take more than 1 tablet every 8 hours.    If you do notice your blood pressure remains high - ALONG with having chest pain, difficulty breathing, blurred vision, or bad headache - you should return to the emergency department.

## 2024-02-06 NOTE — ED Provider Notes (Signed)
 [place] EMERGENCY DEPARTMENT AT [facility] Provider Note   CSN: [id number] Arrival date & time: 02/06/24  9346     Patient presents with: Hypertension   Elizabeth Mcfarland is a 83 y.o. female presented to Emergency Department concern for high blood pressure.  The patient is on amlodipine  10 mg and olmesartan  40 mg which she takes regularly.  She says she has been keeping a diary of her blood pressure at home for the past few weeks.  I reviewed this diary at bedside, going back to October, her blood pressure has been gradually rising, from average 150's systolic to 180's systolic.  This week her SBP was 190 mmhg. she reports that she has noted some intermittent mild chest pains, though not persistent.  She denies any headache, blurred vision.  She was seen in the ER 3 days ago for radiculopathy issues, at that time and her kidney function checked, with a normal creatinine.  She reports that her PCP has started her recently on hydrochlorothiazide  12.5 mg, although she only began taking that regularly yesterday.  She said her blood pressure was still extremely high this morning, which prompted her visit into the ER.  She is concerned because there is a family history of stroke, high blood pressure and a sister who on up on dialysis from high blood pressure.  The patient is here with her daughter at bedside.  The patient denies to me that she smokes or uses nicotine products.  She reports has been eating a low-salt diet   HPI     Prior to Admission medications   Medication Sig Start Date End Date Taking? Authorizing Provider  hydrALAZINE  (APRESOLINE ) 10 MG tablet Take 1 tablet (10 mg total) by mouth 3 (three) times daily as needed for up to 15 days. For persistent high blood pressure over 190 mmhg systolic (top number) on repeat checks 02/06/24 02/21/24 Yes Deshauna Cayson, Donnice PARAS, MD  acetaminophen  (TYLENOL ) 500 MG tablet Take 1,000 mg by mouth every 6 (six) hours as needed for mild pain  (pain score 1-3) or moderate pain (pain score 4-6).    [provider]  amLODipine  (NORVASC ) 10 MG tablet TAKE 1 TABLET BY MOUTH EVERY DAY 11/09/23   [name], MD  anastrozole  (ARIMIDEX ) 1 MG tablet TAKE 1 TABLET BY MOUTH DAILY. 11/01/23   Boscia, Heather E, NP  Ascorbic Acid (VITAMIN C) 1000 MG tablet Take 1,000 mg by mouth daily.    [provider]  aspirin  EC 81 MG tablet Take 81 mg by mouth daily.    [provider]  atorvastatin  (LIPITOR ) 80 MG tablet Take 1 tablet (80 mg total) by mouth daily. 07/01/23   [name], MD  cholecalciferol (VITAMIN D ) 1000 UNITS tablet Take 1,000 Units by mouth daily.    [provider]  dorzolamide -timolol  (COSOPT ) 2-0.5 % ophthalmic solution Place 1 drop into both eyes 2 (two) times daily. 04/28/23   [provider]  glimepiride  (AMARYL ) 4 MG tablet TAKE 1 TABLET BY MOUTH EVERY MORNING. 12/24/23   [name], MD  JANUMET  50-500 MG tablet Take 1 tablet by mouth 2 (two) times daily. 12/09/23   [provider]  Multiple Vitamins-Minerals (CENTRUM SILVER 50+WOMEN PO) Take 1 tablet by mouth daily.    [provider]  nitroGLYCERIN  (NITROSTAT ) 0.4 MG SL tablet Place 1 tablet (0.4 mg total) under the tongue every 5 (five) minutes as needed for chest pain. 06/24/23 06/23/24  [name], MD  olmesartan  (BENICAR ) 40 MG  tablet TAKE 1 TABLET BY MOUTH EVERY DAY 12/24/23   [name], MD  [facility] ULTRA test strip USE AS DIRECTED TWICE DAILY 01/18/21   Georgina Speaks, FNP  pantoprazole  (PROTONIX ) 40 MG tablet Take 1 tablet (40 mg total) by mouth daily. 07/01/23   [name], MD  Polyethyl Glycol-Propyl Glycol (SYSTANE OP) Place 1 drop into both eyes 2 (two) times daily.    [provider]  polyethylene glycol (MIRALAX  / GLYCOLAX ) packet Take 17 g by mouth daily.     [provider]  sitaGLIPtin  (JANUVIA ) 100 MG tablet TAKE 1 TABLET ( 100MG ) BY MOUTH DAILY. Patient not taking: Reported  on 01/26/2024 11/28/23   Palumbo, April, MD    Allergies: Codeine and Percocet [oxycodone-acetaminophen ]    Review of Systems  Updated Vital Signs BP 124/72 (BP Location: Left Arm)   Pulse 65   Temp 98.4 F (36.9 C) (Oral)   Resp 17   SpO2 97%   Physical Exam Constitutional:      General: She is not in acute distress. HENT:     Head: Normocephalic and atraumatic.  Eyes:     Conjunctiva/sclera: Conjunctivae normal.     Pupils: Pupils are equal, round, and reactive to light.  Cardiovascular:     Rate and Rhythm: Normal rate and regular rhythm.  Pulmonary:     Effort: Pulmonary effort is normal. No respiratory distress.  Abdominal:     General: There is no distension.     Tenderness: There is no abdominal tenderness.  Skin:    General: Skin is warm and dry.  Neurological:     General: No focal deficit present.     Mental Status: She is alert. Mental status is at baseline.  Psychiatric:        Mood and Affect: Mood normal.        Behavior: Behavior normal.     (all labs ordered are listed, but only abnormal results are displayed) Labs Reviewed  TROPONIN T, HIGH SENSITIVITY  TROPONIN T, HIGH SENSITIVITY    EKG: EKG Interpretation Date/Time:  Saturday February 06 2024 07:42:27 EST Ventricular Rate:  66 PR Interval:  145 QRS Duration:  142 QT Interval:  411 QTC Calculation: 431 R Axis:   68  Text Interpretation: Sinus rhythm Nonspecific intraventricular conduction delay Confirmed by Cottie Cough [phone]) on 02/06/2024 7:47:33 AM  Radiology: No results found.   Procedures   Medications Ordered in the ED - No data to display                                  Medical Decision Making Amount and/or Complexity of Data Reviewed ECG/medicine tests: ordered.  Risk Prescription drug management.   Patient is presenting with concern for high blood pressure, trending upwards over the past 2 months.  She is under a lot of stress at home according to her  daughter.  This may be contributing.  However she did report some intermittent chest pains.  I think it is reasonable to check an EKG and a troponin level.  3 days ago in the ER the patient had her renal function checked, as well as a chest x-ray which were unremarkable.  I do not think we need to repeat this today.  There are also no signs or symptoms of stroke or intracranial bleed to warrant emergent CT imaging of the head.  Her blood pressure at triage was essentially normal.  I advised the patient to continue taking her new hydrochlorothiazide  medication, explained that this is not a rapid acting medicine, but does need some time to take effect.  We discussed a small dose of hydralazine  10 mg as a rescue medicine to have at home if her blood pressure remains elevated.  I confirmed that she has 2 different blood pressure machines, which she has used chronically, and she double-checks them at home, so I think her readings are likely accurate.  But she will need PCP follow up for this issue.  ECG per my review with no acute ischemia Troponin negative  Okay for discharge     Final diagnoses:  Hypertension, unspecified type    ED Discharge Orders          Ordered    hydrALAZINE  (APRESOLINE ) 10 MG tablet  3 times daily PRN        02/06/24 0835               Cottie Donnice PARAS, MD 02/06/24 [phone]

## 2024-02-24 ENCOUNTER — Telehealth: Payer: Self-pay

## 2024-02-24 NOTE — Telephone Encounter (Signed)
 PAP: Patient assistance application for Janumet  through [employer] has been mailed to pt's home address on file. Provider portion of application will be faxed to provider's office.

## 2024-03-12 ENCOUNTER — Other Ambulatory Visit: Payer: Self-pay | Admitting: Nurse Practitioner

## 2024-04-28 ENCOUNTER — Other Ambulatory Visit: Payer: Self-pay | Admitting: Nurse Practitioner

## 2024-04-28 DIAGNOSIS — C50211 Malignant neoplasm of upper-inner quadrant of right female breast: Secondary | ICD-10-CM

## 2024-04-28 NOTE — Assessment & Plan Note (Signed)
 01/09/2022 - screening mammogram negative  01/10/2023 -3D screening mammogram negative.  Patient taking anastrozole  1 mg daily Continues calcium  and vitamin D  along with low impact exercise every day. 04/27/2023 - breast exam benign except for  keloids present on lumpectomy scar of upper mid portion of the left breast. This is tender. There are radiation changes present of right breast.  -She has scheduled appointment with Dr. Lowery in near future to discuss treatment.  -continue anastrozole  daily. Refill provided.  -bilateral screening mammogram on 01/14/2024. She has breast density category c. The results were overall negative.  --labs with follow up in 6 months. Sooner if needed.

## 2024-04-28 NOTE — Progress Notes (Unsigned)
 " Patient Care Team: Haze Kingfisher, MD as PCP - General (Family Medicine) [name], Jerel, MD as PCP - Cardiology (Cardiology) Tyree Nanetta SAILOR, RN as Oncology Nurse Navigator Lanny Callander, MD as Consulting Physician (Hematology) Curvin Deward MOULD, MD as Consulting Physician (General Surgery) Shannon Agent, MD as Consulting Physician (Radiation Oncology) Burton, Lacie K, NP as Nurse Practitioner (Nurse Practitioner) Arnell Rockney PARAS, RPH (Inactive) (Pharmacist) [place], Cadee Agro, [place] (Optometry)  Clinic Day:  04/29/2024  Referring physician: [name], MD  ASSESSMENT & PLAN:   Assessment & Plan: Malignant neoplasm of upper-inner quadrant of right breast in female, estrogen receptor positive (HCC) 01/09/2022 - screening mammogram negative  01/10/2023 -3D screening mammogram negative.  Patient taking anastrozole  1 mg daily Continues calcium  and vitamin D  along with low impact exercise every day. 04/27/2023 - breast exam benign except for  keloids present on lumpectomy scar of upper mid portion of the left breast. This is tender. There are radiation changes present of right breast.  -She has scheduled appointment with Dr. Lowery in near future to discuss treatment.  -continue anastrozole  daily. Refill provided.  -bilateral screening mammogram on 01/14/2024. She has breast density category c. The results were overall negative.  --labs with follow up in 6 months. Sooner if needed.   Bone health She had DEXA scan done 10/13/2022. She is considered osteopenic/low bone mass. Her FRAX 10 year probability of major osteoporotic fracture is 4.8% and hip fracture 1.0%.   The patient understands the plans discussed today and is in agreement with them.  She knows to contact our office if she develops concerns prior to her next appointment.  I provided *** minutes of face-to-face time during this encounter and > 50% was spent counseling as documented under my assessment and plan.    Powell FORBES Lessen, NP  [facility] - A DEPT OF [facility] [address] [place] [address] Dept: [phone] Dept Fax: [phone]   No orders of the defined types were placed in this encounter.     CHIEF COMPLAINT:  CC: right breast cancer, ER +  Current Treatment:  anastrozole  1 mg daily   INTERVAL HISTORY:  Domenica is here today for repeat clinical assessment. She last saw me on 10/26/2023. She had bilateral screening mammogram on 01/14/2024. She has breast density category c. The results were overall negative. She denies fevers or chills. She denies pain. Her appetite is good. Her weight {Weight change:10426}.  I have reviewed the past medical history, past surgical history, social history and family history with the patient and they are unchanged from previous note.  ALLERGIES:  is allergic to codeine and percocet [oxycodone-acetaminophen ].  MEDICATIONS:  Current Outpatient Medications  Medication Sig Dispense Refill   acetaminophen  (TYLENOL ) 500 MG tablet Take 1,000 mg by mouth every 6 (six) hours as needed for mild pain (pain score 1-3) or moderate pain (pain score 4-6).     amLODipine  (NORVASC ) 10 MG tablet TAKE 1 TABLET(10 MG) BY MOUTH DAILY 90 tablet 1   anastrozole  (ARIMIDEX ) 1 MG tablet TAKE 1 TABLET BY MOUTH DAILY. 90 tablet 1   Ascorbic Acid (VITAMIN C) 1000 MG tablet Take 1,000 mg by mouth daily.     aspirin  EC 81 MG tablet Take 81 mg by mouth daily.     atorvastatin  (LIPITOR ) 80 MG tablet Take 1 tablet (80 mg total) by mouth daily. 90 tablet 3   cholecalciferol (VITAMIN D ) 1000 UNITS tablet  Take 1,000 Units by mouth daily.     dorzolamide-timolol (COSOPT) 2-0.5 % ophthalmic solution Place 1 drop into both eyes 2 (two) times daily.     glimepiride  (AMARYL ) 4 MG tablet TAKE 1 TABLET BY MOUTH EVERY MORNING. 90 tablet 1   hydrALAZINE  (APRESOLINE ) 10 MG tablet Take 1 tablet (10 mg total) by mouth 3 (three) times  daily as needed for up to 15 days. For persistent high blood pressure over 190 mmhg systolic (top number) on repeat checks 15 tablet 0   JANUMET  50-500 MG tablet Take 1 tablet by mouth 2 (two) times daily.     Multiple Vitamins-Minerals (CENTRUM SILVER 50+WOMEN PO) Take 1 tablet by mouth daily.     nitroGLYCERIN  (NITROSTAT ) 0.4 MG SL tablet Place 1 tablet (0.4 mg total) under the tongue every 5 (five) minutes as needed for chest pain. 25 tablet 1   olmesartan  (BENICAR ) 40 MG tablet TAKE 1 TABLET BY MOUTH EVERY DAY 90 tablet 1   ONETOUCH ULTRA test strip USE AS DIRECTED TWICE DAILY 100 strip 3   pantoprazole  (PROTONIX ) 40 MG tablet Take 1 tablet (40 mg total) by mouth daily. 90 tablet 2   Polyethyl Glycol-Propyl Glycol (SYSTANE OP) Place 1 drop into both eyes 2 (two) times daily.     polyethylene glycol (MIRALAX  / GLYCOLAX ) packet Take 17 g by mouth daily.      sitaGLIPtin  (JANUVIA ) 100 MG tablet TAKE 1 TABLET ( 100MG ) BY MOUTH DAILY. (Patient not taking: Reported on 04/29/2024) 30 tablet 0   No current facility-administered medications for this visit.    HISTORY OF PRESENT ILLNESS:   Oncology History Overview Note  Cancer Staging Malignant neoplasm of upper-inner quadrant of right breast in female, estrogen receptor positive (HCC) Staging form: Breast, AJCC 8th Edition - Clinical stage from 10/07/2018: cT2, cN0, cM0, GX, ER+, PR+, HER2: Equivocal - Signed by Lanny Callander, MD on 10/12/2018 - Pathologic stage from 12/25/2018: Stage IA (pT2, pN0, cM0, G2, ER+, PR+, HER2-, Oncotype DX score: 21) - Signed by Lanny Callander, MD on 02/01/2019    Malignant neoplasm of upper-inner quadrant of right breast in female, estrogen receptor positive (HCC)  10/01/2018 Imaging   Bone Density Scan 10/01/18  IMPRESSION Lowest T-score of -0.9 at left femur neck (NORMAL) T-score DualFemur Neck Left  10/01/2018    76.1         -0.9    0.916 g/cm2   AP Spine  L1-L4      10/01/2018    76.1         1.2     1.326 g/cm2    DualFemur Total Mean 10/01/2018    76.1         0.1     1.024 g/cm2   10/05/2018 Mammogram   Diagnostic Mammogram 10/05/18 IMPRESSION: 1. Highly suspicious right breast mass 1 o'clock position 2 cm from the nipple on the right. It measures 2.1 x 1.7 x 1.5 cm. Corresponding with the screening mammographic findings. Recommendation is for ultrasound-guided biopsy. 2. Indeterminate left breast mass at the 10 o'clock position 9 cm from the nipple. It measures 1.3 x 0.5 x 0.3 cm. This may represent a benign etiology such as a degenerating fibroadenoma. However, ultrasound-guided biopsy for definitive tissue diagnosis is recommended. 3. No suspicious lymphadenopathy bilaterally.   10/07/2018 Cancer Staging   Staging form: Breast, AJCC 8th Edition - Clinical stage from 10/07/2018: cT2, cN0, cM0, GX, ER+, PR+, HER2: Equivocal - Signed by Lanny Callander, MD on 10/12/2018  10/07/2018 Initial Biopsy   Diagnosis 10/07/18 1. Breast, right, needle core biopsy, 1 o'clock, 2cmfn - INVASIVE MAMMARY CARCINOMA WITH CALCIFICATIONS. SEE NOTE 2. Breast, left, needle core biopsy, 10 o'clock - FIBROADENOMA WITH DYSTROPHIC CALCIFICATIONS - NEGATIVE FOR CARCINOMA   10/07/2018 Receptors her2   By immunohistochemistry, the tumor cells are EQUIVOCAL for Her2 (2+). HER2 by FISH will be PERFORMED and the RESULTS REPORTED SEPARATELY Estrogen Receptor: 100%, POSITIVE, STRONG STAINING INTENSITY Progesterone Receptor: 70%, POSITIVE, STRONG STAINING INTENSITY Proliferation Marker Ki67: 10%   10/11/2018 Initial Diagnosis   Malignant neoplasm of upper-inner quadrant of right breast in female, estrogen receptor positive (HCC)   10/2018 -  Neo-Adjuvant Anti-estrogen oral therapy   She started anastrozole  end of 10/2018 before surgery.   11/25/2018 Surgery   BILATERAL BREAST LUMPECTOMIES  WITH BILATERAL  RADIOACTIVE SEEDS AND RIGHT SENTINEL LYMPH NODE BIOPSY by Dr Curvin  11/24/28   11/25/2018 Pathology Results   Diagnosis  11/25/18 1. Breast, lumpectomy, Left w/seed - FIBROADENOMA WITH CALCIFICATIONS. - BIOPSY SITE CHANGES. 2. Lymph node, sentinel, biopsy, Right Axillary #1 - ONE LYMPH NODE, NEGATIVE FOR CARCINOMA (0/1). 3. Lymph node, sentinel, biopsy, Right Axillary #2 - ONE LYMPH NODE, NEGATIVE FOR CARCINOMA (0/1). 4. Lymph node, sentinel, biopsy, Right - ONE LYMPH NODE, NEGATIVE FOR CARCINOMA (0/1). 5. Breast, lumpectomy, Right w/seed - INVASIVE DUCTAL CARCINOMA, 2.6 CM, NOTTINGHAM GRADE 2 OF 3. - MARGINS OF RESECTION ARE NOT INVOLVED (CLOSEST MARGIN: LESS THAN 1 MM, SUPERIOR). - BIOPSY SITE CHANGES. - SEE ONCOLOGY TABLE. 6. Medical device, removal, radioactive seed - RADIOACTIVE SEED. - GROSS ONLY.   11/25/2018 Oncotype testing   Oncotype 11/25/18 Recurrence Score 21 with distant recurrence risk at 9 years of with Tamoxifen alone 7%.  There is less than 1% benefit from chemo.    12/25/2018 Cancer Staging   Staging form: Breast, AJCC 8th Edition - Pathologic stage from 12/25/2018: Stage IA (pT2, pN0, cM0, G2, ER+, PR+, HER2-, Oncotype DX score: 21) - Signed by Lanny Callander, MD on 02/01/2019   01/11/2019 - 02/08/2019 Radiation Therapy   Adjuvant Radiation with Dr Shannon 01/11/19-02/08/19   05/05/2019 Survivorship   SCP delivered by Lacie Burton, NP        REVIEW OF SYSTEMS:   Constitutional: Denies fevers, chills or abnormal weight loss Eyes: Denies blurriness of vision Ears, nose, mouth, throat, and face: Denies mucositis or sore throat Respiratory: Denies cough, dyspnea or wheezes Cardiovascular: Denies palpitation, chest discomfort or lower extremity swelling Gastrointestinal:  Denies nausea, heartburn or change in bowel habits Skin: Denies abnormal skin rashes Lymphatics: Denies new lymphadenopathy or easy bruising Neurological:Denies numbness, tingling or new weaknesses Behavioral/Psych: Mood is stable, no new changes  All other systems were reviewed with the patient and are  negative.   VITALS:   Today's Vitals   04/29/24 0911  BP: 133/64  Resp: 17  Temp: 97.6 F (36.4 C)  SpO2: 100%  Weight: 120 lb 3.2 oz (54.5 kg)  PainSc: 0-No pain   Body mass index is 21.98 kg/m.   Wt Readings from Last 3 Encounters:  04/29/24 120 lb 3.2 oz (54.5 kg)  01/18/24 119 lb 3.2 oz (54.1 kg)  11/27/23 121 lb 0.5 oz (54.9 kg)    Body mass index is 21.98 kg/m.  Performance status (ECOG): {CHL ONC [id number]  PHYSICAL EXAM:   GENERAL:alert, no distress and comfortable SKIN: skin color, texture, turgor are normal, no rashes or significant lesions EYES: normal, Conjunctiva are pink and non-injected, sclera clear OROPHARYNX:no exudate, no erythema  and lips, buccal mucosa, and tongue normal  NECK: supple, thyroid  normal size, non-tender, without nodularity LYMPH:  no palpable lymphadenopathy in the cervical, axillary or inguinal LUNGS: clear to auscultation and percussion with normal breathing effort HEART: regular rate & rhythm and no murmurs and no lower extremity edema ABDOMEN:abdomen soft, non-tender and normal bowel sounds Musculoskeletal:no cyanosis of digits and no clubbing  NEURO: alert & oriented x 3 with fluent speech, no focal motor/sensory deficits  LABORATORY DATA:  I have reviewed the data as listed    Component Value Date/Time   NA 137 04/29/2024 0850   NA 140 08/24/2023 1101   K 4.2 04/29/2024 0850   CL 99 04/29/2024 0850   CO2 27 04/29/2024 0850   GLUCOSE 240 (H) 04/29/2024 0850   BUN 16 04/29/2024 0850   BUN 11 08/24/2023 1101   CREATININE 0.86 04/29/2024 0850   CALCIUM  9.7 04/29/2024 0850   PROT 6.8 04/29/2024 0850   PROT 6.9 08/24/2023 1101   ALBUMIN 4.4 04/29/2024 0850   ALBUMIN 4.5 08/24/2023 1101   AST 30 04/29/2024 0850   ALT 25 04/29/2024 0850   ALKPHOS 71 04/29/2024 0850   BILITOT 0.8 04/29/2024 0850   GFRNONAA >60 04/29/2024 0850   GFRAA 64 12/20/2019 1605   GFRAA >60 08/01/2019 0845    No results found for: SPEP,  UPEP  Lab Results  Component Value Date   WBC 5.0 04/29/2024   NEUTROABS 3.6 04/29/2024   HGB 11.8 (L) 04/29/2024   HCT 35.4 (L) 04/29/2024   MCV 91.9 04/29/2024   PLT 180 04/29/2024      Chemistry      Component Value Date/Time   NA 137 04/29/2024 0850   NA 140 08/24/2023 1101   K 4.2 04/29/2024 0850   CL 99 04/29/2024 0850   CO2 27 04/29/2024 0850   BUN 16 04/29/2024 0850   BUN 11 08/24/2023 1101   CREATININE 0.86 04/29/2024 0850      Component Value Date/Time   CALCIUM  9.7 04/29/2024 0850   ALKPHOS 71 04/29/2024 0850   AST 30 04/29/2024 0850   ALT 25 04/29/2024 0850   BILITOT 0.8 04/29/2024 0850       RADIOGRAPHIC STUDIES: I have personally reviewed the radiological images as listed and agreed with the findings in the report. No results found. "

## 2024-04-29 ENCOUNTER — Inpatient Hospital Stay

## 2024-04-29 ENCOUNTER — Inpatient Hospital Stay: Admitting: Nurse Practitioner

## 2024-04-29 VITALS — BP 133/64 | Temp 97.6°F | Resp 17 | Wt 120.2 lb

## 2024-04-29 DIAGNOSIS — C50211 Malignant neoplasm of upper-inner quadrant of right female breast: Secondary | ICD-10-CM

## 2024-04-29 DIAGNOSIS — M8589 Other specified disorders of bone density and structure, multiple sites: Secondary | ICD-10-CM

## 2024-04-29 LAB — CMP (CANCER CENTER ONLY)
ALT: 25 U/L (ref 0–44)
AST: 30 U/L (ref 15–41)
Albumin: 4.4 g/dL (ref 3.5–5.0)
Alkaline Phosphatase: 71 U/L (ref 38–126)
Anion gap: 11 (ref 5–15)
BUN: 16 mg/dL (ref 8–23)
CO2: 27 mmol/L (ref 22–32)
Calcium: 9.7 mg/dL (ref 8.9–10.3)
Chloride: 99 mmol/L (ref 98–111)
Creatinine: 0.86 mg/dL (ref 0.44–1.00)
GFR, Estimated: >60 mL/min
Glucose, Bld: 240 mg/dL — ABNORMAL HIGH (ref 70–99)
Potassium: 4.2 mmol/L (ref 3.5–5.1)
Sodium: 137 mmol/L (ref 135–145)
Total Bilirubin: 0.8 mg/dL (ref 0.0–1.2)
Total Protein: 6.8 g/dL (ref 6.5–8.1)

## 2024-04-29 LAB — CBC WITH DIFFERENTIAL (CANCER CENTER ONLY)
Abs Immature Granulocytes: 0 10*3/uL (ref 0.00–0.07)
Basophils Absolute: 0 10*3/uL (ref 0.0–0.1)
Basophils Relative: 1 %
Eosinophils Absolute: 0 10*3/uL (ref 0.0–0.5)
Eosinophils Relative: 1 %
HCT: 35.4 % — ABNORMAL LOW (ref 36.0–46.0)
Hemoglobin: 11.8 g/dL — ABNORMAL LOW (ref 12.0–15.0)
Immature Granulocytes: 0 %
Lymphocytes Relative: 18 %
Lymphs Abs: 0.9 10*3/uL (ref 0.7–4.0)
MCH: 30.6 pg (ref 26.0–34.0)
MCHC: 33.3 g/dL (ref 30.0–36.0)
MCV: 91.9 fL (ref 80.0–100.0)
Monocytes Absolute: 0.4 10*3/uL (ref 0.1–1.0)
Monocytes Relative: 9 %
Neutro Abs: 3.6 10*3/uL (ref 1.7–7.7)
Neutrophils Relative %: 71 %
Platelet Count: 180 10*3/uL (ref 150–400)
RBC: 3.85 MIL/uL — ABNORMAL LOW (ref 3.87–5.11)
RDW: 12.4 % (ref 11.5–15.5)
WBC Count: 5 10*3/uL (ref 4.0–10.5)
nRBC: 0 % (ref 0.0–0.2)

## 2024-08-03 ENCOUNTER — Ambulatory Visit

## 2024-08-25 ENCOUNTER — Encounter: Payer: Self-pay | Admitting: Internal Medicine

## 2024-11-01 ENCOUNTER — Inpatient Hospital Stay

## 2024-11-01 ENCOUNTER — Inpatient Hospital Stay: Admitting: Nurse Practitioner
